# Patient Record
Sex: Female | Born: 1935 | Race: White | Hispanic: No | Smoking: Never smoker
Health system: Southern US, Community
[De-identification: ages and names within clinical notes are randomized; demographics above are authoritative.]

## PROBLEM LIST (undated history)

## (undated) DIAGNOSIS — H269 Unspecified cataract: Secondary | ICD-10-CM

## (undated) DIAGNOSIS — T8859XA Other complications of anesthesia, initial encounter: Secondary | ICD-10-CM

## (undated) DIAGNOSIS — M8580 Other specified disorders of bone density and structure, unspecified site: Secondary | ICD-10-CM

## (undated) DIAGNOSIS — C4490 Unspecified malignant neoplasm of skin, unspecified: Secondary | ICD-10-CM

## (undated) DIAGNOSIS — N814 Uterovaginal prolapse, unspecified: Secondary | ICD-10-CM

## (undated) DIAGNOSIS — M1990 Unspecified osteoarthritis, unspecified site: Secondary | ICD-10-CM

## (undated) DIAGNOSIS — K921 Melena: Secondary | ICD-10-CM

## (undated) DIAGNOSIS — K635 Polyp of colon: Secondary | ICD-10-CM

## (undated) DIAGNOSIS — C801 Malignant (primary) neoplasm, unspecified: Secondary | ICD-10-CM

## (undated) DIAGNOSIS — C541 Malignant neoplasm of endometrium: Secondary | ICD-10-CM

## (undated) DIAGNOSIS — K5790 Diverticulosis of intestine, part unspecified, without perforation or abscess without bleeding: Secondary | ICD-10-CM

## (undated) DIAGNOSIS — E785 Hyperlipidemia, unspecified: Secondary | ICD-10-CM

## (undated) DIAGNOSIS — K219 Gastro-esophageal reflux disease without esophagitis: Secondary | ICD-10-CM

## (undated) DIAGNOSIS — K8020 Calculus of gallbladder without cholecystitis without obstruction: Secondary | ICD-10-CM

## (undated) DIAGNOSIS — M810 Age-related osteoporosis without current pathological fracture: Secondary | ICD-10-CM

## (undated) DIAGNOSIS — I10 Essential (primary) hypertension: Secondary | ICD-10-CM

## (undated) DIAGNOSIS — K589 Irritable bowel syndrome without diarrhea: Secondary | ICD-10-CM

## (undated) DIAGNOSIS — Z923 Personal history of irradiation: Secondary | ICD-10-CM

## (undated) DIAGNOSIS — R519 Headache, unspecified: Secondary | ICD-10-CM

## (undated) HISTORY — DX: Uterovaginal prolapse, unspecified: N81.4

## (undated) HISTORY — DX: Malignant (primary) neoplasm, unspecified: C80.1

## (undated) HISTORY — PX: POLYPECTOMY: SHX149

## (undated) HISTORY — DX: Unspecified osteoarthritis, unspecified site: M19.90

## (undated) HISTORY — DX: Unspecified cataract: H26.9

## (undated) HISTORY — DX: Essential (primary) hypertension: I10

## (undated) HISTORY — DX: Gastro-esophageal reflux disease without esophagitis: K21.9

## (undated) HISTORY — DX: Personal history of irradiation: Z92.3

## (undated) HISTORY — DX: Unspecified malignant neoplasm of skin, unspecified: C44.90

## (undated) HISTORY — PX: COLONOSCOPY: SHX174

## (undated) HISTORY — DX: Diverticulosis of intestine, part unspecified, without perforation or abscess without bleeding: K57.90

## (undated) HISTORY — DX: Irritable bowel syndrome, unspecified: K58.9

## (undated) HISTORY — DX: Polyp of colon: K63.5

## (undated) HISTORY — DX: Age-related osteoporosis without current pathological fracture: M81.0

## (undated) HISTORY — DX: Other specified disorders of bone density and structure, unspecified site: M85.80

## (undated) HISTORY — DX: Melena: K92.1

## (undated) HISTORY — PX: OTHER SURGICAL HISTORY: SHX169

## (undated) HISTORY — DX: Hyperlipidemia, unspecified: E78.5

---

## 2002-04-11 ENCOUNTER — Encounter: Payer: Self-pay | Admitting: Gastroenterology

## 2002-12-26 ENCOUNTER — Encounter: Payer: Self-pay | Admitting: Gastroenterology

## 2004-03-06 ENCOUNTER — Ambulatory Visit (HOSPITAL_COMMUNITY): Admission: RE | Admit: 2004-03-06 | Discharge: 2004-03-06 | Payer: Self-pay | Admitting: Urology

## 2004-10-25 ENCOUNTER — Ambulatory Visit: Payer: Self-pay | Admitting: Gastroenterology

## 2004-10-28 ENCOUNTER — Ambulatory Visit (HOSPITAL_COMMUNITY): Admission: RE | Admit: 2004-10-28 | Discharge: 2004-10-28 | Payer: Self-pay | Admitting: Gastroenterology

## 2004-10-28 ENCOUNTER — Ambulatory Visit: Payer: Self-pay | Admitting: Internal Medicine

## 2004-10-30 ENCOUNTER — Ambulatory Visit: Payer: Self-pay | Admitting: Internal Medicine

## 2004-11-06 ENCOUNTER — Ambulatory Visit: Payer: Self-pay | Admitting: Internal Medicine

## 2004-12-24 ENCOUNTER — Ambulatory Visit: Payer: Self-pay | Admitting: Internal Medicine

## 2004-12-27 ENCOUNTER — Ambulatory Visit: Payer: Self-pay | Admitting: Internal Medicine

## 2005-01-10 ENCOUNTER — Ambulatory Visit: Payer: Self-pay | Admitting: Gastroenterology

## 2005-01-21 ENCOUNTER — Ambulatory Visit: Payer: Self-pay | Admitting: Cardiology

## 2005-03-31 ENCOUNTER — Ambulatory Visit: Payer: Self-pay | Admitting: Internal Medicine

## 2005-04-08 ENCOUNTER — Ambulatory Visit: Payer: Self-pay | Admitting: Family Medicine

## 2005-04-17 ENCOUNTER — Other Ambulatory Visit: Admission: RE | Admit: 2005-04-17 | Discharge: 2005-04-17 | Payer: Self-pay | Admitting: Obstetrics and Gynecology

## 2005-10-10 ENCOUNTER — Ambulatory Visit: Payer: Self-pay | Admitting: Gastroenterology

## 2005-11-14 ENCOUNTER — Ambulatory Visit: Payer: Self-pay | Admitting: Internal Medicine

## 2006-02-06 ENCOUNTER — Ambulatory Visit: Payer: Self-pay | Admitting: Internal Medicine

## 2006-02-13 ENCOUNTER — Ambulatory Visit: Payer: Self-pay | Admitting: Internal Medicine

## 2006-08-18 ENCOUNTER — Ambulatory Visit: Payer: Self-pay | Admitting: Internal Medicine

## 2006-08-20 ENCOUNTER — Ambulatory Visit: Payer: Self-pay | Admitting: Internal Medicine

## 2006-08-26 ENCOUNTER — Ambulatory Visit: Payer: Self-pay | Admitting: Internal Medicine

## 2006-10-06 ENCOUNTER — Ambulatory Visit: Payer: Self-pay | Admitting: Internal Medicine

## 2006-11-16 ENCOUNTER — Ambulatory Visit: Payer: Self-pay | Admitting: Internal Medicine

## 2006-11-16 LAB — CONVERTED CEMR LAB
ALT: 26 units/L (ref 0–40)
AST: 28 units/L (ref 0–37)
Alkaline Phosphatase: 64 units/L (ref 39–117)
BUN: 17 mg/dL (ref 6–23)
Chol/HDL Ratio, serum: 2.6
Cholesterol: 149 mg/dL (ref 0–200)
Creatinine, Ser: 0.8 mg/dL (ref 0.4–1.2)
Glucose, Bld: 96 mg/dL (ref 70–99)
HDL: 58.4 mg/dL (ref >39.0)
LDL Cholesterol: 81 mg/dL (ref 0–99)
Potassium: 3.8 meq/L (ref 3.5–5.1)
Sodium: 144 meq/L (ref 135–145)
Total Bilirubin: 0.5 mg/dL (ref 0.3–1.2)
Triglyceride fasting, serum: 49 mg/dL (ref 0–149)
VLDL: 10 mg/dL (ref 0–40)

## 2006-11-25 ENCOUNTER — Ambulatory Visit: Payer: Self-pay | Admitting: Internal Medicine

## 2007-02-19 ENCOUNTER — Ambulatory Visit: Payer: Self-pay | Admitting: Internal Medicine

## 2007-02-19 LAB — CONVERTED CEMR LAB
ALT: 30 units/L (ref 0–40)
AST: 25 units/L (ref 0–37)
Cholesterol: 192 mg/dL (ref 0–200)
Creatinine, Ser: 0.8 mg/dL (ref 0.4–1.2)
Glucose, Bld: 104 mg/dL — ABNORMAL HIGH (ref 70–99)
HCV Ab: NEGATIVE
HDL: 52.6 mg/dL (ref >39.0)
Hep B Core Total Ab: NEGATIVE
Hep B S Ab: NEGATIVE
LDL Cholesterol: 118 mg/dL — ABNORMAL HIGH (ref 0–99)
Potassium: 4.6 meq/L (ref 3.5–5.1)
Sodium: 142 meq/L (ref 135–145)
Total CHOL/HDL Ratio: 3.7
Triglycerides: 107 mg/dL (ref 0–149)
VLDL: 21 mg/dL (ref 0–40)

## 2007-02-24 ENCOUNTER — Ambulatory Visit: Payer: Self-pay | Admitting: Internal Medicine

## 2007-09-06 ENCOUNTER — Encounter: Payer: Self-pay | Admitting: Endocrinology

## 2007-09-06 ENCOUNTER — Encounter: Payer: Self-pay | Admitting: *Deleted

## 2007-09-06 DIAGNOSIS — K219 Gastro-esophageal reflux disease without esophagitis: Secondary | ICD-10-CM

## 2007-09-06 DIAGNOSIS — I10 Essential (primary) hypertension: Secondary | ICD-10-CM

## 2007-09-06 DIAGNOSIS — Z8679 Personal history of other diseases of the circulatory system: Secondary | ICD-10-CM | POA: Insufficient documentation

## 2007-09-29 ENCOUNTER — Ambulatory Visit: Payer: Self-pay | Admitting: Internal Medicine

## 2007-09-29 LAB — CONVERTED CEMR LAB
ALT: 42 units/L — ABNORMAL HIGH (ref 0–35)
AST: 43 units/L — ABNORMAL HIGH (ref 0–37)
Albumin: 3.7 g/dL (ref 3.5–5.2)
Alkaline Phosphatase: 99 units/L (ref 39–117)
Bilirubin, Direct: 0.1 mg/dL (ref 0.0–0.3)
Cholesterol: 182 mg/dL (ref 0–200)
Ferritin: 39.4 ng/mL (ref 10.0–291.0)
HDL: 47.2 mg/dL (ref >39.0)
Hgb A1c MFr Bld: 5.9 % (ref 4.6–6.0)
LDL Cholesterol: 119 mg/dL — ABNORMAL HIGH (ref 0–99)
TSH: 1.11 microintl units/mL (ref 0.35–5.50)
Total Bilirubin: 1 mg/dL (ref 0.3–1.2)
Total CHOL/HDL Ratio: 3.9
Total Protein: 6.8 g/dL (ref 6.0–8.3)
Triglycerides: 80 mg/dL (ref 0–149)
VLDL: 16 mg/dL (ref 0–40)

## 2007-10-06 ENCOUNTER — Telehealth: Payer: Self-pay | Admitting: Internal Medicine

## 2007-10-09 ENCOUNTER — Ambulatory Visit: Payer: Self-pay | Admitting: Internal Medicine

## 2007-10-12 ENCOUNTER — Encounter: Payer: Self-pay | Admitting: Internal Medicine

## 2007-11-10 ENCOUNTER — Encounter: Payer: Self-pay | Admitting: Internal Medicine

## 2007-11-15 ENCOUNTER — Ambulatory Visit: Payer: Self-pay | Admitting: Internal Medicine

## 2007-11-15 DIAGNOSIS — G43909 Migraine, unspecified, not intractable, without status migrainosus: Secondary | ICD-10-CM

## 2007-11-21 DIAGNOSIS — E785 Hyperlipidemia, unspecified: Secondary | ICD-10-CM | POA: Insufficient documentation

## 2007-11-21 DIAGNOSIS — M1990 Unspecified osteoarthritis, unspecified site: Secondary | ICD-10-CM | POA: Insufficient documentation

## 2007-11-21 DIAGNOSIS — M899 Disorder of bone, unspecified: Secondary | ICD-10-CM | POA: Insufficient documentation

## 2007-11-21 DIAGNOSIS — M949 Disorder of cartilage, unspecified: Secondary | ICD-10-CM

## 2007-12-06 ENCOUNTER — Ambulatory Visit: Payer: Self-pay | Admitting: Gastroenterology

## 2008-02-07 ENCOUNTER — Telehealth: Payer: Self-pay | Admitting: Internal Medicine

## 2008-02-17 ENCOUNTER — Ambulatory Visit: Payer: Self-pay | Admitting: Internal Medicine

## 2008-02-18 LAB — CONVERTED CEMR LAB
ALT: 25 units/L (ref 0–35)
AST: 23 units/L (ref 0–37)
Albumin: 3.7 g/dL (ref 3.5–5.2)
Alkaline Phosphatase: 82 units/L (ref 39–117)
BUN: 15 mg/dL (ref 6–23)
Bilirubin, Direct: 0.1 mg/dL (ref 0.0–0.3)
CO2: 33 meq/L — ABNORMAL HIGH (ref 19–32)
Calcium: 9.1 mg/dL (ref 8.4–10.5)
Chloride: 105 meq/L (ref 96–112)
Cholesterol: 180 mg/dL (ref 0–200)
Creatinine, Ser: 0.8 mg/dL (ref 0.4–1.2)
GFR calc Af Amer: 91 mL/min
GFR calc non Af Amer: 75 mL/min
Glucose, Bld: 106 mg/dL — ABNORMAL HIGH (ref 70–99)
HDL: 50.1 mg/dL (ref >39.0)
LDL Cholesterol: 107 mg/dL — ABNORMAL HIGH (ref 0–99)
Potassium: 4.5 meq/L (ref 3.5–5.1)
Sodium: 141 meq/L (ref 135–145)
Total Bilirubin: 0.6 mg/dL (ref 0.3–1.2)
Total CHOL/HDL Ratio: 3.6
Total Protein: 6.8 g/dL (ref 6.0–8.3)
Triglycerides: 116 mg/dL (ref 0–149)
VLDL: 23 mg/dL (ref 0–40)

## 2008-02-22 ENCOUNTER — Ambulatory Visit: Payer: Self-pay | Admitting: Internal Medicine

## 2008-02-22 DIAGNOSIS — R0989 Other specified symptoms and signs involving the circulatory and respiratory systems: Secondary | ICD-10-CM | POA: Insufficient documentation

## 2008-02-29 ENCOUNTER — Ambulatory Visit: Payer: Self-pay

## 2008-02-29 ENCOUNTER — Encounter: Payer: Self-pay | Admitting: Internal Medicine

## 2008-03-31 ENCOUNTER — Encounter: Payer: Self-pay | Admitting: Internal Medicine

## 2008-04-28 ENCOUNTER — Encounter: Payer: Self-pay | Admitting: Internal Medicine

## 2008-05-02 ENCOUNTER — Telehealth: Payer: Self-pay | Admitting: Internal Medicine

## 2008-08-01 ENCOUNTER — Ambulatory Visit: Payer: Self-pay | Admitting: Internal Medicine

## 2008-08-02 LAB — CONVERTED CEMR LAB
BUN: 13 mg/dL (ref 6–23)
CO2: 30 meq/L (ref 19–32)
Calcium: 8.8 mg/dL (ref 8.4–10.5)
Chloride: 108 meq/L (ref 96–112)
Creatinine, Ser: 0.9 mg/dL (ref 0.4–1.2)
GFR calc Af Amer: 79 mL/min
GFR calc non Af Amer: 65 mL/min
Glucose, Bld: 104 mg/dL — ABNORMAL HIGH (ref 70–99)
Potassium: 4.4 meq/L (ref 3.5–5.1)
Sodium: 141 meq/L (ref 135–145)

## 2008-08-07 ENCOUNTER — Ambulatory Visit: Payer: Self-pay | Admitting: Internal Medicine

## 2008-08-07 DIAGNOSIS — R109 Unspecified abdominal pain: Secondary | ICD-10-CM | POA: Insufficient documentation

## 2008-08-07 DIAGNOSIS — R7309 Other abnormal glucose: Secondary | ICD-10-CM | POA: Insufficient documentation

## 2008-08-07 DIAGNOSIS — R935 Abnormal findings on diagnostic imaging of other abdominal regions, including retroperitoneum: Secondary | ICD-10-CM

## 2008-08-08 ENCOUNTER — Ambulatory Visit: Payer: Self-pay | Admitting: Cardiology

## 2008-09-12 ENCOUNTER — Ambulatory Visit: Payer: Self-pay | Admitting: Internal Medicine

## 2008-10-06 ENCOUNTER — Encounter: Payer: Self-pay | Admitting: Internal Medicine

## 2008-11-27 ENCOUNTER — Telehealth: Payer: Self-pay | Admitting: Internal Medicine

## 2009-01-05 ENCOUNTER — Ambulatory Visit: Payer: Self-pay | Admitting: Internal Medicine

## 2009-01-05 LAB — CONVERTED CEMR LAB
ALT: 29 units/L (ref 0–35)
AST: 27 units/L (ref 0–37)
Albumin: 3.8 g/dL (ref 3.5–5.2)
Alkaline Phosphatase: 84 units/L (ref 39–117)
BUN: 17 mg/dL (ref 6–23)
Bilirubin, Direct: 0.1 mg/dL (ref 0.0–0.3)
CO2: 30 meq/L (ref 19–32)
Calcium: 8.9 mg/dL (ref 8.4–10.5)
Chloride: 104 meq/L (ref 96–112)
Cholesterol: 177 mg/dL (ref 0–200)
Creatinine, Ser: 0.7 mg/dL (ref 0.4–1.2)
GFR calc Af Amer: 106 mL/min
GFR calc non Af Amer: 87 mL/min
Glucose, Bld: 105 mg/dL — ABNORMAL HIGH (ref 70–99)
HDL: 52.1 mg/dL (ref >39.0)
LDL Cholesterol: 105 mg/dL — ABNORMAL HIGH (ref 0–99)
Potassium: 3.7 meq/L (ref 3.5–5.1)
Sodium: 140 meq/L (ref 135–145)
Total Bilirubin: 0.8 mg/dL (ref 0.3–1.2)
Total CHOL/HDL Ratio: 3.4
Total Protein: 6.9 g/dL (ref 6.0–8.3)
Triglycerides: 102 mg/dL (ref 0–149)
VLDL: 20 mg/dL (ref 0–40)

## 2009-01-23 ENCOUNTER — Ambulatory Visit: Payer: Self-pay | Admitting: Internal Medicine

## 2009-03-27 ENCOUNTER — Ambulatory Visit: Payer: Self-pay | Admitting: Internal Medicine

## 2009-07-19 ENCOUNTER — Ambulatory Visit: Payer: Self-pay | Admitting: Internal Medicine

## 2009-07-19 LAB — CONVERTED CEMR LAB
BUN: 16 mg/dL (ref 6–23)
Basophils Absolute: 0 10*3/uL (ref 0.0–0.1)
Basophils Relative: 0.1 % (ref 0.0–3.0)
Bilirubin Urine: NEGATIVE
CO2: 28 meq/L (ref 19–32)
Calcium: 9.3 mg/dL (ref 8.4–10.5)
Chloride: 102 meq/L (ref 96–112)
Cholesterol: 197 mg/dL (ref 0–200)
Creatinine, Ser: 0.8 mg/dL (ref 0.4–1.2)
Eosinophils Absolute: 0.1 10*3/uL (ref 0.0–0.7)
Eosinophils Relative: 2.5 % (ref 0.0–5.0)
GFR calc non Af Amer: 74.66 mL/min (ref >60)
Glucose, Bld: 99 mg/dL (ref 70–99)
HCT: 34.5 % — ABNORMAL LOW (ref 36.0–46.0)
HDL: 48.5 mg/dL (ref >39.00)
Hemoglobin, Urine: NEGATIVE
Hemoglobin: 11.9 g/dL — ABNORMAL LOW (ref 12.0–15.0)
Ketones, ur: NEGATIVE mg/dL
LDL Cholesterol: 131 mg/dL — ABNORMAL HIGH (ref 0–99)
Leukocytes, UA: NEGATIVE
Lymphocytes Relative: 47.1 % — ABNORMAL HIGH (ref 12.0–46.0)
Lymphs Abs: 2 10*3/uL (ref 0.7–4.0)
MCHC: 34.5 g/dL (ref 30.0–36.0)
MCV: 92.8 fL (ref 78.0–100.0)
Monocytes Absolute: 0.3 10*3/uL (ref 0.1–1.0)
Monocytes Relative: 7.1 % (ref 3.0–12.0)
Neutro Abs: 1.9 10*3/uL (ref 1.4–7.7)
Neutrophils Relative %: 43.2 % (ref 43.0–77.0)
Nitrite: NEGATIVE
Platelets: 200 10*3/uL (ref 150.0–400.0)
Potassium: 4.1 meq/L (ref 3.5–5.1)
RBC: 3.71 M/uL — ABNORMAL LOW (ref 3.87–5.11)
RDW: 12.9 % (ref 11.5–14.6)
Sodium: 145 meq/L (ref 135–145)
Specific Gravity, Urine: 1.015 (ref 1.000–1.030)
TSH: 1.33 microintl units/mL (ref 0.35–5.50)
Total CHOL/HDL Ratio: 4
Total Protein, Urine: NEGATIVE mg/dL
Triglycerides: 86 mg/dL (ref 0.0–149.0)
Urine Glucose: NEGATIVE mg/dL
Urobilinogen, UA: 0.2 (ref 0.0–1.0)
VLDL: 17.2 mg/dL (ref 0.0–40.0)
Vit D, 25-Hydroxy: 40 ng/mL
WBC: 4.3 10*3/uL — ABNORMAL LOW (ref 4.5–10.5)
pH: 7.5 (ref 5.0–8.0)

## 2009-07-31 ENCOUNTER — Ambulatory Visit: Payer: Self-pay | Admitting: Internal Medicine

## 2009-07-31 DIAGNOSIS — R7989 Other specified abnormal findings of blood chemistry: Secondary | ICD-10-CM | POA: Insufficient documentation

## 2009-09-25 ENCOUNTER — Ambulatory Visit: Payer: Self-pay | Admitting: Internal Medicine

## 2009-10-08 ENCOUNTER — Encounter: Payer: Self-pay | Admitting: Internal Medicine

## 2009-12-18 ENCOUNTER — Ambulatory Visit: Payer: Self-pay | Admitting: Internal Medicine

## 2009-12-18 ENCOUNTER — Encounter: Payer: Self-pay | Admitting: Internal Medicine

## 2010-01-18 ENCOUNTER — Ambulatory Visit: Payer: Self-pay | Admitting: Internal Medicine

## 2010-01-18 ENCOUNTER — Telehealth: Payer: Self-pay | Admitting: Internal Medicine

## 2010-01-18 LAB — CONVERTED CEMR LAB
ALT: 27 units/L (ref 0–35)
AST: 22 units/L (ref 0–37)
Albumin: 3.8 g/dL (ref 3.5–5.2)
Alkaline Phosphatase: 100 units/L (ref 39–117)
BUN: 12 mg/dL (ref 6–23)
Bilirubin, Direct: 0.1 mg/dL (ref 0.0–0.3)
CO2: 31 meq/L (ref 19–32)
Calcium: 9.1 mg/dL (ref 8.4–10.5)
Chloride: 103 meq/L (ref 96–112)
Cholesterol: 180 mg/dL (ref 0–200)
Creatinine, Ser: 0.8 mg/dL (ref 0.4–1.2)
GFR calc non Af Amer: 74.56 mL/min (ref >60)
Glucose, Bld: 106 mg/dL — ABNORMAL HIGH (ref 70–99)
HDL: 56.8 mg/dL (ref >39.00)
LDL Cholesterol: 100 mg/dL — ABNORMAL HIGH (ref 0–99)
Potassium: 4.3 meq/L (ref 3.5–5.1)
Sodium: 139 meq/L (ref 135–145)
Total Bilirubin: 0.7 mg/dL (ref 0.3–1.2)
Total CHOL/HDL Ratio: 3
Total Protein: 6.9 g/dL (ref 6.0–8.3)
Triglycerides: 116 mg/dL (ref 0.0–149.0)
VLDL: 23.2 mg/dL (ref 0.0–40.0)

## 2010-01-29 ENCOUNTER — Ambulatory Visit: Payer: Self-pay | Admitting: Internal Medicine

## 2010-04-09 ENCOUNTER — Ambulatory Visit: Payer: Self-pay | Admitting: Gastroenterology

## 2010-05-02 ENCOUNTER — Encounter (INDEPENDENT_AMBULATORY_CARE_PROVIDER_SITE_OTHER): Payer: Self-pay | Admitting: *Deleted

## 2010-05-02 ENCOUNTER — Ambulatory Visit: Payer: Self-pay | Admitting: Gastroenterology

## 2010-05-02 DIAGNOSIS — K5900 Constipation, unspecified: Secondary | ICD-10-CM

## 2010-05-16 ENCOUNTER — Telehealth: Payer: Self-pay | Admitting: Gastroenterology

## 2010-05-22 ENCOUNTER — Ambulatory Visit: Payer: Self-pay | Admitting: Gastroenterology

## 2010-05-23 ENCOUNTER — Ambulatory Visit: Payer: Self-pay | Admitting: Internal Medicine

## 2010-05-23 LAB — CONVERTED CEMR LAB
ALT: 25 units/L (ref 0–35)
AST: 22 units/L (ref 0–37)
Albumin: 3.7 g/dL (ref 3.5–5.2)
Alkaline Phosphatase: 90 units/L (ref 39–117)
BUN: 9 mg/dL (ref 6–23)
Basophils Absolute: 0 10*3/uL (ref 0.0–0.1)
Basophils Relative: 0.5 % (ref 0.0–3.0)
Bilirubin, Direct: 0.1 mg/dL (ref 0.0–0.3)
CO2: 31 meq/L (ref 19–32)
Calcium: 9 mg/dL (ref 8.4–10.5)
Chloride: 103 meq/L (ref 96–112)
Cholesterol: 181 mg/dL (ref 0–200)
Creatinine, Ser: 0.8 mg/dL (ref 0.4–1.2)
Eosinophils Absolute: 0.1 10*3/uL (ref 0.0–0.7)
Eosinophils Relative: 1.9 % (ref 0.0–5.0)
GFR calc non Af Amer: 73.42 mL/min (ref >60)
Glucose, Bld: 99 mg/dL (ref 70–99)
HCT: 32.6 % — ABNORMAL LOW (ref 36.0–46.0)
HDL: 48.9 mg/dL (ref >39.00)
Hemoglobin: 11.3 g/dL — ABNORMAL LOW (ref 12.0–15.0)
LDL Cholesterol: 115 mg/dL — ABNORMAL HIGH (ref 0–99)
Lymphocytes Relative: 45.8 % (ref 12.0–46.0)
Lymphs Abs: 1.7 10*3/uL (ref 0.7–4.0)
MCHC: 34.7 g/dL (ref 30.0–36.0)
MCV: 95.1 fL (ref 78.0–100.0)
Monocytes Absolute: 0.3 10*3/uL (ref 0.1–1.0)
Monocytes Relative: 7.9 % (ref 3.0–12.0)
Neutro Abs: 1.6 10*3/uL (ref 1.4–7.7)
Neutrophils Relative %: 43.9 % (ref 43.0–77.0)
Platelets: 231 10*3/uL (ref 150.0–400.0)
Potassium: 4.2 meq/L (ref 3.5–5.1)
RBC: 3.43 M/uL — ABNORMAL LOW (ref 3.87–5.11)
RDW: 13.4 % (ref 11.5–14.6)
Sodium: 140 meq/L (ref 135–145)
Total Bilirubin: 0.7 mg/dL (ref 0.3–1.2)
Total CHOL/HDL Ratio: 4
Total Protein: 5.9 g/dL — ABNORMAL LOW (ref 6.0–8.3)
Triglycerides: 86 mg/dL (ref 0.0–149.0)
VLDL: 17.2 mg/dL (ref 0.0–40.0)
Vitamin B-12: 569 pg/mL (ref 211–911)
WBC: 3.6 10*3/uL — ABNORMAL LOW (ref 4.5–10.5)

## 2010-05-31 ENCOUNTER — Ambulatory Visit: Payer: Self-pay | Admitting: Internal Medicine

## 2010-05-31 DIAGNOSIS — F411 Generalized anxiety disorder: Secondary | ICD-10-CM

## 2010-07-15 ENCOUNTER — Encounter: Payer: Self-pay | Admitting: Internal Medicine

## 2010-09-19 ENCOUNTER — Ambulatory Visit: Payer: Self-pay | Admitting: Internal Medicine

## 2010-09-19 LAB — CONVERTED CEMR LAB
ALT: 32 units/L (ref 0–35)
AST: 24 units/L (ref 0–37)
Albumin: 3.7 g/dL (ref 3.5–5.2)
Alkaline Phosphatase: 99 units/L (ref 39–117)
BUN: 17 mg/dL (ref 6–23)
Basophils Absolute: 0 10*3/uL (ref 0.0–0.1)
Basophils Relative: 0.4 % (ref 0.0–3.0)
Bilirubin Urine: NEGATIVE
Bilirubin, Direct: 0.1 mg/dL (ref 0.0–0.3)
CO2: 29 meq/L (ref 19–32)
Calcium: 9 mg/dL (ref 8.4–10.5)
Chloride: 97 meq/L (ref 96–112)
Cholesterol: 187 mg/dL (ref 0–200)
Creatinine, Ser: 0.9 mg/dL (ref 0.4–1.2)
Eosinophils Absolute: 0.1 10*3/uL (ref 0.0–0.7)
Eosinophils Relative: 1.3 % (ref 0.0–5.0)
GFR calc non Af Amer: 66.67 mL/min (ref >60)
Glucose, Bld: 99 mg/dL (ref 70–99)
HCT: 33.8 % — ABNORMAL LOW (ref 36.0–46.0)
HDL: 49.7 mg/dL (ref >39.00)
Hemoglobin, Urine: NEGATIVE
Hemoglobin: 11.7 g/dL — ABNORMAL LOW (ref 12.0–15.0)
Ketones, ur: NEGATIVE mg/dL
LDL Cholesterol: 115 mg/dL — ABNORMAL HIGH (ref 0–99)
Leukocytes, UA: NEGATIVE
Lymphocytes Relative: 41.9 % (ref 12.0–46.0)
Lymphs Abs: 2 10*3/uL (ref 0.7–4.0)
MCHC: 34.5 g/dL (ref 30.0–36.0)
MCV: 95.6 fL (ref 78.0–100.0)
Monocytes Absolute: 0.5 10*3/uL (ref 0.1–1.0)
Monocytes Relative: 10.1 % (ref 3.0–12.0)
Neutro Abs: 2.2 10*3/uL (ref 1.4–7.7)
Neutrophils Relative %: 46.3 % (ref 43.0–77.0)
Nitrite: NEGATIVE
Platelets: 247 10*3/uL (ref 150.0–400.0)
Potassium: 4.4 meq/L (ref 3.5–5.1)
RBC: 3.54 M/uL — ABNORMAL LOW (ref 3.87–5.11)
RDW: 13.2 % (ref 11.5–14.6)
Sodium: 133 meq/L — ABNORMAL LOW (ref 135–145)
Specific Gravity, Urine: 1.02 (ref 1.000–1.030)
TSH: 1.3 microintl units/mL (ref 0.35–5.50)
Total Bilirubin: 0.6 mg/dL (ref 0.3–1.2)
Total CHOL/HDL Ratio: 4
Total Protein, Urine: NEGATIVE mg/dL
Total Protein: 6.2 g/dL (ref 6.0–8.3)
Triglycerides: 113 mg/dL (ref 0.0–149.0)
Urine Glucose: NEGATIVE mg/dL
Urobilinogen, UA: 0.2 (ref 0.0–1.0)
VLDL: 22.6 mg/dL (ref 0.0–40.0)
WBC: 4.8 10*3/uL (ref 4.5–10.5)
pH: 7 (ref 5.0–8.0)

## 2010-09-27 ENCOUNTER — Encounter: Payer: Self-pay | Admitting: Internal Medicine

## 2010-09-27 ENCOUNTER — Ambulatory Visit: Payer: Self-pay | Admitting: Internal Medicine

## 2010-10-10 LAB — HM MAMMOGRAPHY: HM Mammogram: NORMAL

## 2010-10-15 ENCOUNTER — Encounter: Payer: Self-pay | Admitting: Internal Medicine

## 2010-11-11 ENCOUNTER — Telehealth: Payer: Self-pay | Admitting: Internal Medicine

## 2010-11-15 ENCOUNTER — Telehealth: Payer: Self-pay | Admitting: Internal Medicine

## 2010-11-20 ENCOUNTER — Telehealth: Payer: Self-pay | Admitting: Internal Medicine

## 2011-01-05 ENCOUNTER — Encounter: Payer: Self-pay | Admitting: Urology

## 2011-01-14 NOTE — Assessment & Plan Note (Signed)
Summary: FU---STC   Vital Signs:  Patient profile:   77 year old female Weight:      158 pounds BMI:     24.11 Temp:     97.7 degrees F oral Pulse rate:   61 / minute BP sitting:   122 / 76  (left arm)  Vitals Entered By: Tora Perches (January 29, 2010 10:03 AM) CC: f/u Is Patient Diabetic? No   Primary Care Provider:  Tresa Garter MD  CC:  f/u.  History of Present Illness: The patient presents for a follow up of hypertension, anxiety, hyperlipidemia.   Preventive Screening-Counseling & Management  Alcohol-Tobacco     Smoking Status: never  Current Medications (verified): 1)  Captopril 50 Mg Tabs (Captopril) .Marland Kitchen.. 1 By Mouth Bid 2)  Lipitor 20 Mg  Tabs (Atorvastatin Calcium) .... Take 1 By Mouth Qd 3)  Butalbital-Apap-Caffeine 50-325-40 Mg  Tabs (Butalbital-Apap-Caffeine) .... Take 1 By Mouth Qd 4)  Lorazepam 0.5 Mg  Tabs (Lorazepam) .... Take 1 By Mouth Qd 5)  Omeprazole 20 Mg Cpdr (Omeprazole) .... 2 Once Daily 6)  Vitamin D3 1000 Unit  Tabs (Cholecalciferol) .Marland Kitchen.. 1 Qd 7)  Inderal La 120 Mg Xr24h-Cap (Propranolol Hcl) .Marland Kitchen.. 1 By Mouth Q Am 8)  Hydrochlorothiazide 12.5 Mg Caps (Hydrochlorothiazide) .... Once Daily  Allergies: 1)  Zocor 2)  Fosamax (Alendronate Sodium)  Past History:  Past Medical History: Last updated: 11/15/2007 GERD Hypertension Headache Hyperlipidemia Osteoarthritis Osteopenia  Social History: Last updated: 11/15/2007 Single Never Smoked  Family History: Family History of CAD Female 1st degree relative <50 Family History of Colon CA 1st degree relative <60 M CVA  Review of Systems  The patient denies fever, dyspnea on exertion, abdominal pain, and melena.    Physical Exam  General:  Well-developed,well-nourished,in no acute distress; alert,appropriate and cooperative throughout examination Nose:  External nasal examination shows no deformity or inflammation. Nasal mucosa are pink and moist without lesions or  exudates. Mouth:  Oral mucosa and oropharynx without lesions or exudates.  Teeth in good repair. Neck:  No deformities, masses, or tenderness noted. Lungs:  Normal respiratory effort, chest expands symmetrically. Lungs are clear to auscultation, no crackles or wheezes. Heart:  Normal rate and regular rhythm. S1 and S2 normal without gallop, murmur, click, rub or other extra sounds. Abdomen:  Bowel sounds positive,abdomen soft and non-tender without masses, organomegaly or hernias noted. Msk:  No deformity or scoliosis noted of thoracic or lumbar spine.   Extremities:  No clubbing, cyanosis, edema, or deformity noted with normal full range of motion of all joints.   Neurologic:  No cranial nerve deficits noted. Station and gait are normal. Plantar reflexes are down-going bilaterally. DTRs are symmetrical throughout. Sensory, motor and coordinative functions appear intact. Skin:  Intact without suspicious lesions or rashes Psych:  Cognition and judgment appear intact. Alert and cooperative with normal attention span and concentration. No apparent delusions, illusions, hallucinations   Impression & Recommendations:  Problem # 1:  ABNORMAL GLUCOSE NEC (ICD-790.29) Assessment Comment Only The labs were reviewed with the patient.   Wt loss  Problem # 2:  HYPERLIPIDEMIA (ICD-272.4) Assessment: Improved  Her updated medication list for this problem includes:    Lipitor 20 Mg Tabs (Atorvastatin calcium) .Marland Kitchen... Take 1 by mouth qd  Problem # 3:  CAROTID BRUIT, RIGHT (ICD-785.9), fam h/o CVA Assessment: Comment Only Aggrenox [place] OK  Problem # 4:  HYPERTENSION (ICD-401.9) Assessment: Unchanged  The following medications were removed from the medication list:  Captopril 50 Mg Tabs (Captopril) .Marland Kitchen..Marland Kitchen Two times a day Her updated medication list for this problem includes:    Captopril 50 Mg Tabs (Captopril) .Marland Kitchen... 1 by mouth bid    Inderal La 120 Mg Xr24h-cap (Propranolol hcl) .Marland Kitchen... 1 by mouth q  am    Hydrochlorothiazide 12.5 Mg Caps (Hydrochlorothiazide) ..... Once daily  Complete Medication List: 1)  Captopril 50 Mg Tabs (Captopril) .Marland Kitchen.. 1 by mouth bid 2)  Lipitor 20 Mg Tabs (Atorvastatin calcium) .... Take 1 by mouth qd 3)  Butalbital-apap-caffeine 50-325-40 Mg Tabs (Butalbital-apap-caffeine) .... Take 1 by mouth qd 4)  Lorazepam 0.5 Mg Tabs (Lorazepam) .... Take 1 by mouth qd 5)  Omeprazole 20 Mg Cpdr (Omeprazole) .... 2 once daily 6)  Vitamin D3 1000 Unit Tabs (Cholecalciferol) .Marland Kitchen.. 1 qd 7)  Inderal La 120 Mg Xr24h-cap (Propranolol hcl) .Marland Kitchen.. 1 by mouth q am 8)  Hydrochlorothiazide 12.5 Mg Caps (Hydrochlorothiazide) .... Once daily 9)  Aspirin 81 Mg Tbec (Aspirin) .Marland Kitchen.. 1 by mouth qd 10)  Aggrenox 25-200 Mg Xr12h-cap (Aspirin-dipyridamole) .Marland Kitchen.. 1 by mouth bid  Contraindications/Deferment of Procedures/Staging:    Test/Procedure: Pneumovax vaccine    Reason for deferment: patient declined   Patient Instructions: 1)  Please schedule a follow-up appointment in 4 months. 2)  BMP prior to visit, ICD-9: 3)  Hepatic Panel prior to visit, ICD-9: 4)  Lipid Panel prior to visit, ICD-9: 5)  CBC w/ Diff prior to visit, ICD-9:782.0 995.20 272.0 6)  Vit B12 7)  Use stretching exercises that I have provided (15 min. or longer every day) Prescriptions: LORAZEPAM 0.5 MG  TABS (LORAZEPAM) TAKE 1 by mouth QD  #30 x 6   Entered and Authorized by:   Tresa Garter MD   Signed by:   Tresa Garter MD on 01/29/2010   Method used:   Print then Give to Patient   RxID:   [id number] HYDROCHLOROTHIAZIDE 12.5 MG CAPS (HYDROCHLOROTHIAZIDE) once daily  #30 x 12   Entered and Authorized by:   Tresa Garter MD   Signed by:   Tresa Garter MD on 01/29/2010   Method used:   Electronically to        [facility] # [phone]* (retail)       [address] [place], [place]  [address]       Ph: [phone]       Fax: [phone]   RxID:   [id number] INDERAL  LA 120 MG XR24H-CAP (PROPRANOLOL HCL) 1 by mouth q am  #30 x 12   Entered and Authorized by:   Tresa Garter MD   Signed by:   Tresa Garter MD on 01/29/2010   Method used:   [facility] to        [facility] # [phone]* (retail)       [address] [place], [place]  [address]       Ph: [phone]       Fax: [phone]   RxID:   [id number] BUTALBITAL-APAP-CAFFEINE 50-325-40 MG  TABS (BUTALBITAL-APAP-CAFFEINE) TAKE 1 by mouth QD  #60 x 6   Entered and Authorized by:   Tresa Garter MD   Signed by:   Tresa Garter MD on 01/29/2010   Method used:   Electronically to        [facility] # [phone]* (retail)       [phone]  [address] [place], [place]  [address]       Ph: [phone]       Fax: [phone]   RxID:   [id number] LIPITOR 20 MG  TABS (ATORVASTATIN CALCIUM) TAKE 1 by mouth QD  #30 x 12   Entered and Authorized by:   Tresa Garter MD   Signed by:   Tresa Garter MD on 01/29/2010   Method used:   Electronically to        [facility] # [phone]* (retail)       [address] [place], [place]  [address]       Ph: [phone]       Fax: [phone]   RxID:   [id number] CAPTOPRIL 50 MG TABS (CAPTOPRIL) 1 by mouth bid  #60 x 12   Entered and Authorized by:   Tresa Garter MD   Signed by:   Tresa Garter MD on 01/29/2010   Method used:   Electronically to        [facility] # [phone]* (retail)       [address] [place], [place]  [address]       Ph: [phone]       Fax: [phone]   RxID:   [id number] AGGRENOX 25-200 MG XR12H-CAP (ASPIRIN-DIPYRIDAMOLE) 1 by mouth bid  #60 x 12   Entered and Authorized by:   Tresa Garter MD   Signed by:   Tresa Garter MD on 01/29/2010   Method used:   Electronically to        [facility] # [phone]* (retail)       [address] [place], [place]  [address]       Ph: [phone]       Fax: [phone]    RxID:   [id number]

## 2011-01-14 NOTE — Assessment & Plan Note (Signed)
Summary: FLU SHOT/ AVP /NWS     Flu Vaccine Consent Questions     Do you have a history of severe allergic reactions to this vaccine? no    Any prior history of allergic reactions to egg and/or gelatin? no    Do you have a sensitivity to the preservative Thimersol? no    Do you have a past history of Guillan-Barre Syndrome? no    Do you currently have an acute febrile illness? no    Have you ever had a severe reaction to latex? no    Vaccine information given and explained to patient? yes    Are you currently pregnant? no    Lot Number:[id number]   Exp Date:06/14/2011   Site Given  Right Deltoid IM  Nurse Visit   Allergies: 1)  Zocor 2)  Fosamax (Alendronate Sodium)  Orders Added: 1)  Flu Vaccine 83yrs + MEDICARE PATIENTS [Q2039] 2)  Administration Flu vaccine - MCR [G0008]

## 2011-01-14 NOTE — Progress Notes (Signed)
Summary: resend rx   Phone Note Call from Patient Call back at Home Phone [phone]   Caller: Patient Call For: Jarold Motto Reason for Call: Talk to Nurse Summary of Call: [facility] [address], the pharmacy that it was sent to is the wrong one. Initial call taken by: Tawni Levy,  May 16, 2010 8:49 AM  Follow-up for Phone Call        LM for pt that Rx has been sent as requested. Follow-up by: Ashok Cordia RN,  May 16, 2010 8:59 AM    Prescriptions: MOVIPREP 100 GM  SOLR (PEG-KCL-NACL-NASULF-NA ASC-C) As per prep instructions.  #1 x 0   Entered by:   Ashok Cordia RN   Authorized by:   Mardella Layman MD [facility]   Signed by:   Ashok Cordia RN on 05/16/2010   Method used:   Electron[facility] to        [facility] # [phone]* (retail)       [address] [place], [place]  [address]       Ph: [phone]       Fax: [phone]   RxID:   [id number]

## 2011-01-14 NOTE — Assessment & Plan Note (Signed)
Summary: REC COL, AGGRENOX, SPEAKS RUSSIAN...AS.   History of Present Illness Visit Type: new paitient Primary GI MD: Sheryn Bison MD FACP FAGA Primary Provider: Sonda Primes, MD Chief Complaint: pt is requesting colonoscopy she is on Aggrenox, pt's mother had colon cancer History of Present Illness:   Extremely Pleasant 77 year old Russian-American Caucasian female who has chronic functional constipation without rectal bleeding or abdominal pain. Colonoscopy 5 years ago was unremarkable. She has chronic acid reflux and takes daily omeprazole. She previously had endoscopy and esophageal dilatation. She denies any significant reflux symptoms or dysphagia at this time. She is on Aggrenox 25-200 mg twice a day because of a family history of peripheral vascular disease and CVAs. The patient herself has not had any evidence previously of strokes or vascular compromise. She does have hyperlipidemia and mild hypertension and has chronic headaches. She denies abuse of alcohol, cigarettes, or other NSAIDs. Family history is positive for colon cancer in her mother at age 92.  She denies any specific food intolerances and is eating a regular diet with no weight loss. She's not had previous abdominal surgical procedures. She denies any current cardiovascular, pulmonary, or genitourinary gynecologic problems.   GI Review of Systems    Reports acid reflux and  heartburn.      Denies abdominal pain, belching, bloating, chest pain, dysphagia with liquids, dysphagia with solids, loss of appetite, nausea, vomiting, vomiting blood, weight loss, and  weight gain.      Reports constipation.     Denies anal fissure, black tarry stools, change in bowel habit, diarrhea, diverticulosis, fecal incontinence, heme positive stool, hemorrhoids, irritable bowel syndrome, jaundice, light color stool, liver problems, rectal bleeding, and  rectal pain. Preventive Screening-Counseling & Management      Drug Use:  no.        Current Medications (verified): 1)  Captopril 50 Mg Tabs (Captopril) .Marland Kitchen.. 1 By Mouth Bid 2)  Lipitor 20 Mg  Tabs (Atorvastatin Calcium) .... Take 1 By Mouth Qd 3)  Butalbital-Apap-Caffeine 50-325-40 Mg  Tabs (Butalbital-Apap-Caffeine) .... Take 1 By Mouth Qd 4)  Lorazepam 0.5 Mg  Tabs (Lorazepam) .... Take 1 By Mouth Qd 5)  Omeprazole 20 Mg Cpdr (Omeprazole) .... 2 Once Daily 6)  Vitamin D3 1000 Unit  Tabs (Cholecalciferol) .Marland Kitchen.. 1 Qd 7)  Inderal La 120 Mg Xr24h-Cap (Propranolol Hcl) .Marland Kitchen.. 1 By Mouth Q Am 8)  Hydrochlorothiazide 12.5 Mg Caps (Hydrochlorothiazide) .... Once Daily 9)  Aggrenox 25-200 Mg Xr12h-Cap (Aspirin-Dipyridamole) .Marland Kitchen.. 1 By Mouth Bid  Allergies (verified): 1)  Zocor 2)  Fosamax (Alendronate Sodium)  Past History:  Past medical, surgical, family and social histories (including risk factors) reviewed for relevance to current acute and chronic problems.  Past Medical History: Reviewed history from 11/15/2007 and no changes required. GERD Hypertension Headache Hyperlipidemia Osteoarthritis Osteopenia  Past Surgical History: Reviewed history from 09/06/2007 and no changes required. egd (12/26/2002)  Family History: Reviewed history from 01/29/2010 and no changes required. Family History of CAD Female 1st degree relative <50 Family History of Colon CA 1st degree relative <60--Mother M CVA  Social History: Reviewed history from 11/15/2007 and no changes required. Widowed, 1 boy Never Smoked Alcohol Use - no Daily Caffeine Use--tea Illicit Drug Use - no Drug Use:  no  Review of Systems       The patient complains of arthritis/joint pain, back pain, and sleeping problems.  The patient denies allergy/sinus, anemia, anxiety-new, blood in urine, breast changes/lumps, change in vision, confusion, cough, coughing up blood, depression-new,  fainting, fatigue, fever, headaches-new, hearing problems, heart murmur, heart rhythm changes, itching, menstrual pain, muscle  pains/cramps, night sweats, nosebleeds, pregnancy symptoms, shortness of breath, skin rash, sore throat, swelling of feet/legs, swollen lymph glands, thirst - excessive, urination - excessive, urination changes/pain, urine leakage, and voice change.    Vital Signs:  Patient profile:   77 year old female Height:      68 inches Weight:      150 pounds BMI:     22.89 Pulse rate:   68 / minute Pulse rhythm:   regular BP sitting:   114 / 80  (left arm) Cuff size:   regular  Vitals Entered By: Francee Piccolo CMA Duncan Dull) (May 02, 2010 10:23 AM)  Physical Exam  General:  Well developed, well nourished, no acute distress.healthy appearing.   Head:  Normocephalic and atraumatic. Eyes:  PERRLA, no icterus.exam deferred to patient's ophthalmologist.   Lungs:  Clear throughout to auscultation. Heart:  Regular rate and rhythm; no murmurs, rubs,  or bruits. Abdomen:  Soft, nontender and nondistended. No masses, hepatosplenomegaly or hernias noted. Normal bowel sounds. Rectal:  deferred until time of colonoscopy.   Extremities:  No clubbing, cyanosis, edema or deformities noted. Neurologic:  Alert and  oriented x4;  grossly normal neurologically. Psych:  Alert and cooperative. Normal mood and affect.   Impression & Recommendations:  Problem # 1:  CONSTIPATION (ICD-564.00) Assessment Unchanged Continue fiber substitutes as tolerated with liberal p.o. fluids. Followup colonoscopy has been scheduled for screening purposes at her convenience. We will hold Aggrenox 5 days before this procedure. I do not think that she is at increased risk for CVA with these precautions. She appears to be in a regular rhythm at this time and has no history of peripheral vascular disease, carotid artery disease and previous CVAs.  Problem # 2:  FAMILY HISTORY OF COLON CA 1ST DEGREE RELATIVE <60 (ICD-V16.0) Assessment: Unchanged  Problem # 3:  HYPERTENSION (ICD-401.9) Assessment: Improved Blood Pressure today is  114/80 and pulse was 68 and regular. She is to continue her other medications as per primary care.  Patient Instructions: 1)  Copy sent to : Dr. Trinna Post Plotnikov 2)  Please continue current medications.  3)  Constipation and Hemorrhoids brochure given.  4)  Colonoscopy and Flexible Sigmoidoscopy brochure given.  5)  Conscious Sedation brochure given.  6)  Vear Clock colon health probiotic tabs twice a day as tolerated 7)  Hold Aggrenox 5 days before colonoscopy procedure.  Appended Document: REC COL, AGGRENOX, SPEAKS RUSSIAN...AS.    Clinical Lists Changes  Medications: Added new medication of MOVIPREP 100 GM  SOLR (PEG-KCL-NACL-NASULF-NA ASC-C) As per prep instructions. - Signed Added new medication of [facility]IPS COLON HEALTH  CAPS (PROBIOTIC PRODUCT) two times a day Rx of MOVIPREP 100 GM  SOLR (PEG-KCL-NACL-NASULF-NA ASC-C) As per prep instructions.;  #1 x 0;  Signed;  Entered by: Ashok Cordia RN;  Authorized by: Mardella Layman MD [facility];  Method used: Electronically to [facility] [address]. [phone]*, [address] [place], [place], [place]  [address], Ph: [phone] or [phone], Fax: [phone] Orders: Added new Test order of Colonoscopy (Colon) - Signed    Prescriptions: MOVIPREP 100 GM  SOLR (PEG-KCL-NACL-NASULF-NA ASC-C) As per prep instructions.  #1 x 0   Entered by:   Ashok Cordia RN   Authorized by:   Mardella Layman MD [facility]   Signed by:   Ashok Cordia RN on 05/02/2010   Method used:   Electronically to  [facility] [address]* (retail)       [address]       [place], [place]  [address]       Ph: [phone] or [phone]       Fax: [phone]   RxID:   [id number]

## 2011-01-14 NOTE — Miscellaneous (Signed)
Summary: mammogram 2011   Clinical Lists Changes  Observations: Added new observation of MAMMOGRAM: normal (10/10/2010 11:57)      Preventive Care Screening  Mammogram:    Date:  10/10/2010    Results:  normal

## 2011-01-14 NOTE — Procedures (Signed)
Summary: Colonoscopy  Patient: Sophia Santos Note: All result statuses are Final unless otherwise noted.  Tests: (1) Colonoscopy (COL)   COL Colonoscopy           DONE     [facility]     [address]     [place], [place]  [address]           COLONOSCOPY PROCEDURE REPORT           PATIENT:  Sophia Santos, Sophia Santos  MR#:  [id number]     BIRTHDATE:  1934/03/02, 76 yrs. old  GENDER:  female     ENDOSCOPIST:  Vania Rea. Jarold Motto, MD, [facility]     REF. BY:     PROCEDURE DATE:  05/22/2010     PROCEDURE:  Colonoscopy with snare polypectomy     ASA CLASS:  Class II     INDICATIONS:  history of polyps     MEDICATIONS:   Fentanyl 50 mcg IV, Versed 6 mg IV           DESCRIPTION OF PROCEDURE:   After the risks benefits and     alternatives of the procedure were thoroughly explained, informed     consent was obtained.  Digital rectal exam was performed and     revealed no abnormalities.   The LB [id number] endoscope     was introduced through the anus and advanced to the cecum, which     was identified by both the appendix and ileocecal valve, without     limitations.  The quality of the prep was poor, using MoviPrep.     The instrument was then slowly withdrawn as the colon was fully     examined.     <<PROCEDUREIMAGES>>           FINDINGS:  Severe diverticulosis was found in the sigmoid to     descending colon segments.  A sessile polyp was found in the     ascending colon. 3-4 MM DIMINUTIVE POLYP COLD SNARE EXCISED.NO     TISSUE PER POOR PREP AND SEEDS.  This was otherwise a normal     examination of the colon.   Retroflexed views in the rectum     revealed no abnormalities.    The scope was then withdrawn from     the patient and the procedure completed.           COMPLICATIONS:  None     ENDOSCOPIC IMPRESSION:     1) Severe diverticulosis in the sigmoid to descending colon     segments     2) Sessile polyp in the ascending colon     3) Otherwise normal examination  RECOMMENDATIONS:     1) Follow up colonoscopy in 5 years     2) High fiber diet.     REPEAT EXAM:  No           ______________________________     Vania Rea. Jarold Motto, MD, [facility]           CC:  Linda Hedges. Plotnikov, MD           n.     Rosalie DoctorVania Rea. Patterson at 05/22/2010 11:05 AM           [name], [phone]  Note: An exclamation mark (!) indicates a result that was not dispersed into the flowsheet. Document Creation Date: 05/22/2010 11:06 AM _______________________________________________________________________  (1) Order result status: Final Collection or observation date-time: 05/22/2010 10:58 Requested  date-time:  Receipt date-time:  Reported date-time:  Referring Physician:   Ordering Physician: Sheryn Bison [phone]) Specimen Source:  Source: Launa Grill Order Number: [id number] Lab site:   Appended Document: Colonoscopy     Procedures Next Due Date:    Colonoscopy: 05/2015

## 2011-01-14 NOTE — Letter (Signed)
Summary: [facility] Instructions  [facility]  [address] [place], [place] [address]   Phone: [phone]  Fax: [phone]       Sophia Santos    Jun 22, 1934    MRN: [id number]        Procedure Day Dorna Bloom: Wednesday, 05/22/10     Arrival Time: 10:00      Procedure Time: 11:00     Location of Procedure:                    _X _  [facility] (4th Floor)                        PREPARATION FOR COLONOSCOPY WITH MOVIPREP   Starting 5 days prior to your procedure 05/17/10 do not eat nuts, seeds, popcorn, corn, beans, peas,  salads, or any raw vegetables.  Do not take any fiber supplements (e.g. Metamucil, Citrucel, and Benefiber).  THE DAY BEFORE YOUR PROCEDURE         DATE: 05/21/10  DAY: Tuesday  1.  Drink clear liquids the entire day-NO SOLID FOOD  2.  Do not drink anything colored red or purple.  Avoid juices with pulp.  No orange juice.  3.  Drink at least 64 oz. (8 glasses) of fluid/clear liquids during the day to prevent dehydration and help the prep work efficiently.  CLEAR LIQUIDS INCLUDE: Water Jello Ice Popsicles Tea (sugar ok, no milk/cream) Powdered fruit flavored drinks Coffee (sugar ok, no milk/cream) Gatorade Juice: apple, white grape, white cranberry  Lemonade Clear bullion, consomm, broth Carbonated beverages (any kind) Strained chicken noodle soup Hard Candy                             4.  In the morning, mix first dose of MoviPrep solution:    Empty 1 Pouch A and 1 Pouch B into the disposable container    Add lukewarm drinking water to the top line of the container. Mix to dissolve    Refrigerate (mixed solution should be used within 24 hrs)  5.  Begin drinking the prep at 5:00 p.m. The MoviPrep container is divided by 4 marks.   Every 15 minutes drink the solution down to the next mark (approximately 8 oz) until the full liter is complete.   6.  Follow completed prep with 16 oz of clear liquid of your choice (Nothing red  or purple).  Continue to drink clear liquids until bedtime.  7.  Before going to bed, mix second dose of MoviPrep solution:    Empty 1 Pouch A and 1 Pouch B into the disposable container    Add lukewarm drinking water to the top line of the container. Mix to dissolve    Refrigerate  THE DAY OF YOUR PROCEDURE      DATE: 05/22/10   DAY: Wednesday  Beginning at 6:00 a.m. (5 hours before procedure):         1. Every 15 minutes, drink the solution down to the next mark (approx 8 oz) until the full liter is complete.  2. Follow completed prep with 16 oz. of clear liquid of your choice.    3. You may drink clear liquids until 9:00  (2 HOURS BEFORE PROCEDURE).   MEDICATION INSTRUCTIONS  Unless otherwise instructed, you should take regular prescription medications with a small sip of water   as early as possible the morning  of your procedure.      Stop Aggrenox on 05/17/10.   Five days before your procedure.            OTHER INSTRUCTIONS  You will need a responsible adult at least 75 years of age to accompany you and drive you home.   This person must remain in the waiting room during your procedure.  Wear loose fitting clothing that is easily removed.  Leave jewelry and other valuables at home.  However, you may wish to bring a book to read or  an iPod/MP3 player to listen to music as you wait for your procedure to start.  Remove all body piercing jewelry and leave at home.  Total time from sign-in until discharge is approximately 2-3 hours.  You should go home directly after your procedure and rest.  You can resume normal activities the  day after your procedure.  The day of your procedure you should not:   Drive   Make legal decisions   Operate machinery   Drink alcohol   Return to work  You will receive specific instructions about eating, activities and medications before you leave.    The above instructions have been reviewed and explained to me by    _______________________    I fully understand and can verbalize these instructions _____________________________ Date _________

## 2011-01-14 NOTE — Assessment & Plan Note (Signed)
Summary: 4 MO ROV /NWS  #   Vital Signs:  Patient profile:   77 year old female Height:      68 inches Weight:      151 pounds BMI:     23.04 Temp:     97.7 degrees F oral Pulse rate:   88 / minute Pulse rhythm:   regular Resp:     16 per minute BP sitting:   130 / 76  (left arm) Cuff size:   regular  Vitals Entered By: Lanier Prude, Beverly Gust) (September 27, 2010 10:21 AM) CC: MWV Is Patient Diabetic? No   Primary Care Provider:  Sonda Primes, MD  CC:  MWV.  History of Present Illness: The patient presents for a preventive health examination  Patient past medical history, social history, and family history reviewed in detail no significant changes.  Patient is physically active. Depression is negative and mood is good. Hearing is normal, and able to perform activities of daily living. Risk of falling is negligible and home safety has been reviewed and is appropriate. Patient has normal height, weight, and visual acuity is nl w/glasses. Patient has been counseled on age-appropriate routine health concerns for screening and prevention. Education, counseling done.  The patient presents for a follow up of HAs, back pain, anxiety, HTN.   Preventive Screening-Counseling & Management  Alcohol-Tobacco     Alcohol drinks/day: 0     Smoking Status: never  Caffeine-Diet-Exercise     Caffeine Counseling: not indicated; caffeine use is not excessive or problematic     Diet Counseling: not indicated; diet is assessed to be healthy     Does Patient Exercise: yes     Depression Counseling: not indicated; screening negative for depression  Hep-HIV-STD-Contraception     Hepatitis Risk: no risk noted     HIV Risk Counseling: not indicated-no HIV risk noted     Sun Exposure Counseling: not indicated; sun exposure is acceptable  Safety-Violence-Falls     Seat Belt Use: yes     Violence in the Home: no risk noted     Fall Risk Counseling: not indicated; no significant falls  noted  Current Medications (verified): 1)  Captopril 50 Mg Tabs (Captopril) .Marland Kitchen.. 1 By Mouth Bid 2)  Lipitor 20 Mg  Tabs (Atorvastatin Calcium) .... Take 1 By Mouth Qd 3)  Butalbital-Apap-Caffeine 50-325-40 Mg  Tabs (Butalbital-Apap-Caffeine) .... Take 1 By Mouth Two Times A Day Prn 4)  Omeprazole 20 Mg Cpdr (Omeprazole) .... 2 Once Daily 5)  Vitamin D3 1000 Unit  Tabs (Cholecalciferol) .Marland Kitchen.. 1 Qd 6)  Inderal La 120 Mg Xr24h-Cap (Propranolol Hcl) .Marland Kitchen.. 1 By Mouth Q Am 7)  Hydrochlorothiazide 12.5 Mg Caps (Hydrochlorothiazide) .... Once Daily 8)  Aggrenox 25-200 Mg Xr12h-Cap (Aspirin-Dipyridamole) .Marland Kitchen.. 1 By Mouth Bid 9)  Phillips Colon Health  Caps (Probiotic Product) .... Two Times A Day 10)  Lorazepam 1 Mg Tabs (Lorazepam) .Marland Kitchen.. 1 By Mouth Two Times A Day As Needed Anxiety  Allergies (verified): 1)  Zocor 2)  Fosamax (Alendronate Sodium)  Past History:  Past Medical History: Last updated: 05/31/2010 GERD Hypertension Headache Hyperlipidemia Osteoarthritis Osteopenia Anxiety  Family History: Last updated: 05/02/2010 Family History of CAD Female 1st degree relative <50 Family History of Colon CA 1st degree relative <60--Mother M CVA  Social History: Last updated: 05/02/2010 Widowed, 1 boy Never Smoked Alcohol Use - no Daily Caffeine Use--tea Illicit Drug Use - no  Past Surgical History: egd (12/26/2002) Colon 2011 Dr Jarold Motto  Social History: Does  Patient Exercise:  yes Hepatitis Risk:  no risk noted Seat Belt Use:  yes  Review of Systems       The patient complains of headaches.  The patient denies weight gain, vision loss, prolonged cough, anorexia, fever, weight loss, decreased hearing, hoarseness, chest pain, syncope, dyspnea on exertion, peripheral edema, hemoptysis, abdominal pain, melena, hematochezia, severe indigestion/heartburn, hematuria, incontinence, genital sores, muscle weakness, suspicious skin lesions, transient blindness, difficulty walking,  depression, unusual weight change, abnormal bleeding, enlarged lymph nodes, angioedema, and breast masses.    Physical Exam  General:  Well-developed,well-nourished,in no acute distress; alert,appropriate and cooperative throughout examination Head:  Normocephalic and atraumatic without obvious abnormalities. No apparent alopecia or balding. Eyes:  No corneal or conjunctival inflammation noted. EOMI. Perrla. Funduscopic exam benign, without hemorrhages, exudates or papilledema. Vision grossly normal. Ears:  External ear exam shows no significant lesions or deformities.  Otoscopic examination reveals clear canals, tympanic membranes are intact bilaterally without bulging, retraction, inflammation or discharge. Hearing is grossly normal bilaterally. Nose:  External nasal examination shows no deformity or inflammation. Nasal mucosa are pink and moist without lesions or exudates. Mouth:  Oral mucosa and oropharynx without lesions or exudates.  Teeth in good repair. Neck:  No deformities, masses, or tenderness noted. Chest Wall:  No deformities, masses, or tenderness noted. Lungs:  Normal respiratory effort, chest expands symmetrically. Lungs are clear to auscultation, no crackles or wheezes. Heart:  Normal rate and regular rhythm. S1 and S2 normal without gallop, murmur, click, rub or other extra sounds. Abdomen:  Bowel sounds positive,abdomen soft and non-tender without masses, organomegaly or hernias noted. Msk:  No deformity or scoliosis noted of thoracic or lumbar spine.   Pulses:  R and L carotid,radial,femoral,dorsalis pedis and posterior tibial pulses are full and equal bilaterally Extremities:  No clubbing, cyanosis, edema, or deformity noted with normal full range of motion of all joints.   Neurologic:  No cranial nerve deficits noted. Station and gait are normal. Plantar reflexes are down-going bilaterally. DTRs are symmetrical throughout. Sensory, motor and coordinative functions appear  intact. Skin:  AK x 5 on face Cervical Nodes:  No lymphadenopathy noted Axillary Nodes:  No palpable lymphadenopathy Inguinal Nodes:  No significant adenopathy Psych:  Cognition and judgment appear intact. Alert and cooperative with normal attention span and concentration. No apparent delusions, illusions, hallucinations   Impression & Recommendations:  Problem # 1:  ROUTINE GENERAL MEDICAL EXAM@HEALTH  CARE FACL (ICD-V70.0) Assessment New Overall doing well, age appropriate education and counseling updated and referral for appropriate preventive services done unless declined, immunizations up to date or declined, diet counseling done if overweight, urged to quit smoking if smokes, most recent labs reviewed and current ordered if appropriate, ecg reviewed or declined (interpretation per ECG scanned in the EMR if done); information regarding Medicare Preventation requirements given if appropriate.  Colon 2011 Orders: EKG w/ Interpretation (93000) OK The labs were reviewed with the patient.   Problem # 2:  HEADACHE (ICD-784.0) Assessment: Deteriorated She was given Topamax - side effects. Shots in head made it worse. Taking Topamax 3 a day now per [facility].  Her updated medication list for this problem includes:    Butalbital-apap-caffeine 50-325-40 Mg Tabs (Butalbital-apap-caffeine) .Marland Kitchen... Take 1 by mouth two times a day as needed (she was taking 1 qhs) - she stopped per [facility]    Inderal La 120 Mg Xr24h-cap (Propranolol hcl) .Marland Kitchen... 1 by mouth q am  Problem # 3:  ANXIETY (ICD-300.00) Assessment: Unchanged  Her updated  medication list for this problem includes:    Lorazepam 1 Mg Tabs (Lorazepam) .Marland Kitchen... 1 by mouth two times a day as needed anxiety  Problem # 4:  HYPERLIPIDEMIA (ICD-272.4) Assessment: Comment Only  Her updated medication list for this problem includes:    Lipitor 20 Mg Tabs (Atorvastatin calcium) .Marland Kitchen... Take 1 by mouth qd  Labs Reviewed: SGOT: 24 (09/19/2010)   SGPT:  32 (09/19/2010)   HDL:49.70 (09/19/2010), 48.90 (05/23/2010)  LDL:115 (09/19/2010), 115 (05/23/2010)  Chol:187 (09/19/2010), 181 (05/23/2010)  Trig:113.0 (09/19/2010), 86.0 (05/23/2010)  Problem # 5:  ACTINIC KERATOSIS, HEAD (ICD-702.0) Assessment: Comment Only  Orders: Cryotherapy/Destruction benign or premalignant lesion (1st lesion)  (17000) Cryotherapy/Destruction benign or premalignant lesion (2nd-14th lesions) (17003) Procedure: cryo Indication: AK(s) Risks incl. scar(s), incomplete removal, ect.  and benefits discussed    5  lesion(s) on face was/were treated with liqid N2 in usual fasion.  Tolerated well. Compl. none. Wound care instructions given.  Complete Medication List: 1)  Captopril 50 Mg Tabs (Captopril) .Marland Kitchen.. 1 by mouth bid 2)  Lipitor 20 Mg Tabs (Atorvastatin calcium) .... Take 1 by mouth qd 3)  Omeprazole 20 Mg Cpdr (Omeprazole) .... 2 once daily 4)  Vitamin D3 1000 Unit Tabs (Cholecalciferol) .Marland Kitchen.. 1 qd 5)  Inderal La 120 Mg Xr24h-cap (Propranolol hcl) .Marland Kitchen.. 1 by mouth q am 6)  Aggrenox 25-200 Mg Xr12h-cap (Aspirin-dipyridamole) .Marland Kitchen.. 1 by mouth bid 7)  Lorazepam 1 Mg Tabs (Lorazepam) .Marland Kitchen.. 1 by mouth two times a day as needed anxiety 8)  Topamax 100 Mg Tabs (Topiramate) .Marland Kitchen.. 1 by mouth tid 9)  Nasacort Aq 55 Mcg/act Aers (Triamcinolone acetonide) .... 2 inh qd 10)  Zyrtec Allergy 10 Mg Tabs (Cetirizine hcl) .Marland Kitchen.. 1 by mouth qd 11)  Dulcolax 5 Mg Tbec (Bisacodyl) .Marland Kitchen.. 1-2 by mouth once daily as needed constipation  Other Orders: Medicare -1st Annual Wellness Visit [phone])  Patient Instructions: 1)  Please schedule a follow-up appointment in 6 months. 2)  BMP prior to visit, ICD-9: 3)  Hepatic Panel prior to visit, ICD-9:272.0 995.20 4)  Lipid Panel prior to visit, ICD-9:  Prescriptions: LORAZEPAM 1 MG TABS (LORAZEPAM) 1 by mouth two times a day as needed anxiety  #60 x 6   Entered and Authorized by:   Tresa Garter MD   Signed by:   Tresa Garter MD  on 09/27/2010   Method used:   Print then Give to Patient   RxID:   [id number] ZYRTEC ALLERGY 10 MG TABS (CETIRIZINE HCL) 1 by mouth qd  #30 x 11   Entered and Authorized by:   Tresa Garter MD   Signed by:   Tresa Garter MD on 09/27/2010   Method used:   Electronically to        [facility] [phone]* (retail)       [address] [place], [place]  [address]       Ph: [phone]       Fax: [phone]   RxID:   [id number] DULCOLAX 5 MG TBEC (BISACODYL) 1-2 by mouth once daily as needed constipation  #60 x 6   Entered and Authorized by:   Tresa Garter MD   Signed by:   Tresa Garter MD on 09/27/2010   Method used:   Electronically to        [facility] [phone]* (retail)       [address] [place]  [place], [place]  [address]       Ph: [phone]       Fax: [phone]   RxID:   [id number] NASACORT AQ 55 MCG/ACT AERS (TRIAMCINOLONE ACETONIDE) 2 inh qd  #1 x 11   Entered and Authorized by:   Tresa Garter MD   Signed by:   Tresa Garter MD on 09/27/2010   Method used:   Electronically to        [facility] # [phone]* (retail)       [address] [place], [place]  [address]       Ph: [phone]       Fax: [phone]   RxID:   [id number] AGGRENOX 25-200 MG XR12H-CAP (ASPIRIN-DIPYRIDAMOLE) 1 by mouth bid  #60 x 12   Entered and Authorized by:   Tresa Garter MD   Signed by:   Tresa Garter MD on 09/27/2010   Method used:   Electronically to        [facility] # [phone]* (retail)       [address] [place], [place]  [address]       Ph: [phone]       Fax: [phone]   RxID:   [id number] INDERAL LA 120 MG XR24H-CAP (PROPRANOLOL HCL) 1 by mouth q am  #30 x 12   Entered and Authorized by:   Tresa Garter MD   Signed by:   Tresa Garter MD on 09/27/2010   Method used:   Electronically to        [facility] # [phone]* (retail)       [address] [place], [place]  [address]       Ph: [phone]       Fax: [phone]   RxID:   [id number] OMEPRAZOLE 20 MG CPDR (OMEPRAZOLE) 2 once daily  #60 Capsule x 12   Entered and Authorized by:   Tresa Garter MD   Signed by:   Tresa Garter MD on 09/27/2010   Method used:   Electronically to        [facility] # [phone]* (retail)       [address] [place], [place]  [address]       Ph: [phone]       Fax: [phone]   RxID:   [id number] LIPITOR 20 MG  TABS (ATORVASTATIN CALCIUM) TAKE 1 by mouth QD  #30 x 12   Entered and Authorized by:   Tresa Garter MD   Signed by:   Tresa Garter MD on 09/27/2010   Method used:   Electronically to        [facility] # [phone]* (retail)       [address] [place], [place]  [address]       Ph: [phone]       Fax: [phone]   RxID:   [id number] CAPTOPRIL 50 MG TABS (CAPTOPRIL) 1 by mouth bid  #60 x 12   Entered and Authorized by:   Tresa Garter MD   Signed by:   Tresa Garter MD on 09/27/2010   Method used:   Electronically to        [facility] # [phone]* (retail)  [address] [place], [place]  [address]       Ph: [phone]       Fax: [phone]   RxID:   [id number] DULCOLAX 5 MG TBEC (BISACODYL) 1-2 by mouth once daily as needed constipation  #60 x 6   Entered and Authorized by:   Tresa Garter MD   Signed by:   Tresa Garter MD on 09/27/2010   Method used:     RxID:   [id number] AGGRENOX 25-200 MG XR12H-CAP (ASPIRIN-DIPYRIDAMOLE) 1 by mouth bid  #60 x 12   Entered and Authorized by:   Tresa Garter MD   Signed by:   Tresa Garter MD on 09/27/2010   Method used:     RxID:   [id number] INDERAL LA 120 MG XR24H-CAP (PROPRANOLOL HCL) 1 by mouth q am  #30 x 12   Entered and Authorized by:   Tresa Garter MD   Signed by:   Tresa Garter MD on 09/27/2010   Method used:      RxID:   [id number] OMEPRAZOLE 20 MG CPDR (OMEPRAZOLE) 2 once daily  #60 Capsule x 12   Entered and Authorized by:   Tresa Garter MD   Signed by:   Tresa Garter MD on 09/27/2010   Method used:     RxID:   [id number] LIPITOR 20 MG  TABS (ATORVASTATIN CALCIUM) TAKE 1 by mouth QD  #30 x 12   Entered and Authorized by:   Tresa Garter MD   Signed by:   Tresa Garter MD on 09/27/2010   Method used:     RxID:   [id number] CAPTOPRIL 50 MG TABS (CAPTOPRIL) 1 by mouth bid  #60 x 12   Entered and Authorized by:   Tresa Garter MD   Signed by:   Tresa Garter MD on 09/27/2010   Method used:     RxID:   [id number] ZYRTEC ALLERGY 10 MG TABS (CETIRIZINE HCL) 1 by mouth qd  #30 x 11   Entered and Authorized by:   Tresa Garter MD   Signed by:   Tresa Garter MD on 09/27/2010   Method used:     RxID:   [id number] NASACORT AQ 55 MCG/ACT AERS (TRIAMCINOLONE ACETONIDE) 2 inh qd  #1 x 11   Entered and Authorized by:   Tresa Garter MD   Signed by:   Tresa Garter MD on 09/27/2010   Method used:     RxID:   [id number]

## 2011-01-14 NOTE — Progress Notes (Signed)
Summary: LAB REQUEST  Phone Note Call from Patient Call back at Home Phone [phone]   Caller: Patient Summary of Call: Pt came into the office requesting a lab work for today. Pt is here in the office waiting.  Initial call taken by: Livingston Diones,  January 18, 2010 9:06 AM  Follow-up for Phone Call        Erie Noe has taken care of. No further action needed. Follow-up by: Lucious Groves,  January 18, 2010 10:08 AM

## 2011-01-14 NOTE — Procedures (Signed)
Summary: EGD   EGD  Procedure date:  12/26/2002  Findings:      Location: [facility]    EGD  Procedure date:  12/26/2002  Findings:      Location: [facility]   Patient Name: Sophia Santos, Sophia Santos MRN:  Procedure Procedures: Panendoscopy (EGD) CPT: 43235.    with biopsy(s)/brushing(s). CPT: D1846139.    with esophageal dilation. CPT: G9296129.  Personnel: Endoscopist: Vania Rea. Jarold Motto, MD.  Exam Location: Exam performed in Outpatient Clinic. Outpatient  Patient Consent: Procedure, Alternatives, Risks and Benefits discussed, consent obtained, from patient. Consent was obtained by the RN.  Indications Symptoms: Dysphagia. Reflux symptoms for >10 yrs, occurring 3-6 times/wk.  History  Pre-Exam Physical: Performed Dec 26, 2002  Cardio-pulmonary exam, Abdominal exam, Extremity exam, Mental status exam WNL.  Exam Exam Info: Maximum depth of insertion Duodenum, intended Duodenum. Patient position: on left side. Duration of exam: 15 minutes. Vocal cords visualized. Gastric retroflexion performed. Images taken. ASA Classification: I. Tolerance: excellent.  Sedation Meds: Patient assessed and found to be appropriate for moderate (conscious) sedation. Fentanyl 50 mcg. given IV. Versed 5 mg. given IV. Cetacaine Spray 1 sprays given aerosolized.  Monitoring: BP and pulse monitoring done. Oximetry used. Supplemental O2 given at 2 Liters.  Instrument(s): GIF 140. Serial [id number].   Findings - STRICTURE / STENOSIS: Distal Esophagus.  Constriction: partial. Lumen diameter is 14 mm. ICD9: Esophageal Stricture: 530.3.  - Dilation: Distal Esophagus. for esophageal stricture. Maloney dilator used, Diameter: 17 mm, Minimal Resistance, No Heme present on extraction. 1  total dilators used. Patient tolerance excellent. Outcome: successful.  - HIATAL HERNIA: Prolapsing, 3 cms. in length. ICD9: Hernia, Hiatal: 553.3. - DIAGNOSTIC TEST: from Body to Antrum. RUT  done, results pending   Assessment  Diagnoses: 553.3: Hernia, Hiatal. GERD.  530.3: Esophageal Stricture.   Events  Unplanned Intervention: No unplanned interventions were required.  Plans Medication(s): PPI: Esomeprazole/Nexium 40 mg BID, starting Dec 26, 2002 for 4 wks.   Patient Education: Patient given standard instructions for: Reflux. Stenosis / Stricture. Soft diet for 24 hours.  Disposition: After procedure patient sent to recovery. After recovery patient sent home.  Scheduling: Clinic Visit, to Vania Rea. Jarold Motto, MD, around Jan 26, 2003.    This report was created from the original endoscopy report, which was reviewed and signed by the above listed endoscopist.

## 2011-01-14 NOTE — Miscellaneous (Signed)
Summary: Lec previsit  Clinical Lists Changes     Pt came in today for the previsit prior to the colonoscopy on 04/22/10 with Dr  Jarold Motto .Pt speaks [place] and there was a language barrier trying to explain the prep,  the procedure, and the pt also was on Aggrenox(-blood thinner ).She  said she would stop the Aggrenox on her own.Tried to explain to pt she could not do this, and she got very frustrated. Spoke with Jill Side at front desk and she got an interpretor on the phone to try to explain things to pt.Her appt on 04/22/10 for the colonoscopy was cancelled and she was rescheduled  for an appt with Dr Jarold Motto  on 05/02/10 in the office and an interpretor was scheduled to be here that day also.

## 2011-01-14 NOTE — Miscellaneous (Signed)
Summary: BONE DENSITY  Clinical Lists Changes  Orders: Added new Test order of T-Bone Densitometry (77080) - Signed Added new Test order of T-Lumbar Vertebral Assessment (77082) - Signed 

## 2011-01-14 NOTE — Progress Notes (Signed)
Summary: Voltaren ?  Phone Note From Pharmacy   Caller: [facility] Summary of Call: rec fax from pharm requesting specific instructions on Voltaren( how many grams/day per how many joints/day?).  The quantity written was 400gm(4 tubes)....Marland KitchenMarland KitchenSo, how many days is ONE TUBE good for? Initial call taken by: Lanier Prude, [facility]),  November 20, 2010 1:50 PM  Follow-up for Phone Call        4 g qid on a knee joint as needed Thank you!  Follow-up by: Tresa Garter MD,  November 20, 2010 5:34 PM    New/Updated Medications: VOLTAREN 1 % GEL (DICLOFENAC SODIUM) 4 grams four times a day on knee joint as needed Prescriptions: VOLTAREN 1 % GEL (DICLOFENAC SODIUM) 4 grams four times a day on knee joint as needed  #1 mth x 6   Entered by:   Lamar Sprinkles, CMA   Authorized by:   Tresa Garter MD   Signed by:   Lamar Sprinkles, CMA on 11/20/2010   Method used:   Electronically to        [facility] # [phone]* (retail)       [address] [place], [place]  [address]       Ph: [phone]       Fax: [phone]   RxID:   [id number]

## 2011-01-14 NOTE — Assessment & Plan Note (Signed)
Summary: 4 MO ROV /NWS  #   Vital Signs:  Patient profile:   77 year old female Height:      68 inches Weight:      149.13 pounds BMI:     22.76 O2 Sat:      97 % on Room air Temp:     97.3 degrees F oral Pulse rate:   60 / minute BP sitting:   104 / 66  (left arm) Cuff size:   regular  Vitals Entered By: Lucious Groves (May 31, 2010 9:56 AM)  O2 Flow:  Room air CC: 4 MO RTN OV./KB Is Patient Diabetic? No Pain Assessment Patient in pain? no      Comments Patient notes that she is not taking the Moviprep./kb   Primary Care Provider:  Sonda Primes, MD  CC:  4 MO RTN OV./KB.  History of Present Illness: The patient presents for a follow up of hypertension, HAs and anxiety, isomnia, hyperlipidemia   Current Medications (verified): 1)  Captopril 50 Mg Tabs (Captopril) .Marland Kitchen.. 1 By Mouth Bid 2)  Lipitor 20 Mg  Tabs (Atorvastatin Calcium) .... Take 1 By Mouth Qd 3)  Butalbital-Apap-Caffeine 50-325-40 Mg  Tabs (Butalbital-Apap-Caffeine) .... Take 1 By Mouth Qd 4)  Lorazepam 0.5 Mg  Tabs (Lorazepam) .... Take 1 By Mouth Qd 5)  Omeprazole 20 Mg Cpdr (Omeprazole) .... 2 Once Daily 6)  Vitamin D3 1000 Unit  Tabs (Cholecalciferol) .Marland Kitchen.. 1 Qd 7)  Inderal La 120 Mg Xr24h-Cap (Propranolol Hcl) .Marland Kitchen.. 1 By Mouth Q Am 8)  Hydrochlorothiazide 12.5 Mg Caps (Hydrochlorothiazide) .... Once Daily 9)  Aggrenox 25-200 Mg Xr12h-Cap (Aspirin-Dipyridamole) .Marland Kitchen.. 1 By Mouth Bid 10)  Moviprep 100 Gm  Solr (Peg-Kcl-Nacl-Nasulf-Na Asc-C) .... As Per Prep Instructions. 11)  Phillips Colon Health  Caps (Probiotic Product) .... Two Times A Day  Allergies (verified): 1)  Zocor 2)  Fosamax (Alendronate Sodium)  Past History:  Past Surgical History: Last updated: 09/06/2007 egd (12/26/2002)  Social History: Last updated: 05/02/2010 Widowed, 1 boy Never Smoked Alcohol Use - no Daily Caffeine Use--tea Illicit Drug Use - no  Past Medical  History: GERD Hypertension Headache Hyperlipidemia Osteoarthritis Osteopenia Anxiety  Review of Systems  The patient denies weight loss, weight gain, chest pain, prolonged cough, hemoptysis, and melena.    Physical Exam  General:  Well-developed,well-nourished,in no acute distress; alert,appropriate and cooperative throughout examination Ears:  External ear exam shows no significant lesions or deformities.  Otoscopic examination reveals clear canals, tympanic membranes are intact bilaterally without bulging, retraction, inflammation or discharge. Hearing is grossly normal bilaterally. Nose:  External nasal examination shows no deformity or inflammation. Nasal mucosa are pink and moist without lesions or exudates. Mouth:  Oral mucosa and oropharynx without lesions or exudates.  Teeth in good repair. Neck:  No deformities, masses, or tenderness noted. Lungs:  Normal respiratory effort, chest expands symmetrically. Lungs are clear to auscultation, no crackles or wheezes. Heart:  Normal rate and regular rhythm. S1 and S2 normal without gallop, murmur, click, rub or other extra sounds. Abdomen:  Bowel sounds positive,abdomen soft and non-tender without masses, organomegaly or hernias noted. Msk:  No deformity or scoliosis noted of thoracic or lumbar spine.   Extremities:  No clubbing, cyanosis, edema, or deformity noted with normal full range of motion of all joints.   Neurologic:  No cranial nerve deficits noted. Station and gait are normal. Plantar reflexes are down-going bilaterally. DTRs are symmetrical throughout. Sensory, motor and coordinative functions appear intact. Skin:  Intact without suspicious lesions or rashes Psych:  Cognition and judgment appear intact. Alert and cooperative with normal attention span and concentration. No apparent delusions, illusions, hallucinations   Impression & Recommendations:  Problem # 1:  HYPERLIPIDEMIA (ICD-272.4) Assessment Improved  Her  updated medication list for this problem includes:    Lipitor 20 Mg Tabs (Atorvastatin calcium) .Marland Kitchen... Take 1 by mouth qd  Problem # 2:  GERD (ICD-530.81) Assessment: Unchanged  Her updated medication list for this problem includes:    Omeprazole 20 Mg Cpdr (Omeprazole) .Marland Kitchen... 2 once daily  Problem # 3:  HEADACHE (ICD-784.0) Assessment: Deteriorated  Her updated medication list for this problem includes:    Butalbital-apap-caffeine 50-325-40 Mg Tabs (Butalbital-apap-caffeine) .Marland Kitchen... Take 1 by mouth two times a day prn    Inderal La 120 Mg Xr24h-cap (Propranolol hcl) .Marland Kitchen... 1 by mouth q am  Problem # 4:  ANXIETY (ICD-300.00) Assessment: Deteriorated  The following medications were removed from the medication list:    Lorazepam 0.5 Mg Tabs (Lorazepam) .Marland Kitchen... Take 1 by mouth qd Her updated medication list for this problem includes:    Lorazepam 1 Mg Tabs (Lorazepam) .Marland Kitchen... 1 by mouth two times a day as needed anxiety  Problem # 5:  ABNORMAL GLUCOSE NEC (ICD-790.29) Assessment: Comment Only  Labs Reviewed: Creat: 0.8 (05/23/2010)     Problem # 6:  OSTEOARTHRITIS (ICD-715.90) Assessment: Unchanged  Her updated medication list for this problem includes:    Butalbital-apap-caffeine 50-325-40 Mg Tabs (Butalbital-apap-caffeine) .Marland Kitchen... Take 1 by mouth two times a day prn  Complete Medication List: 1)  Captopril 50 Mg Tabs (Captopril) .Marland Kitchen.. 1 by mouth bid 2)  Lipitor 20 Mg Tabs (Atorvastatin calcium) .... Take 1 by mouth qd 3)  Butalbital-apap-caffeine 50-325-40 Mg Tabs (Butalbital-apap-caffeine) .... Take 1 by mouth two times a day prn 4)  Omeprazole 20 Mg Cpdr (Omeprazole) .... 2 once daily 5)  Vitamin D3 1000 Unit Tabs (Cholecalciferol) .Marland Kitchen.. 1 qd 6)  Inderal La 120 Mg Xr24h-cap (Propranolol hcl) .Marland Kitchen.. 1 by mouth q am 7)  Hydrochlorothiazide 12.5 Mg Caps (Hydrochlorothiazide) .... Once daily 8)  Aggrenox 25-200 Mg Xr12h-cap (Aspirin-dipyridamole) .Marland Kitchen.. 1 by mouth bid 9)  Phillips Colon  Health Caps (Probiotic product) .... Two times a day 10)  Lorazepam 1 Mg Tabs (Lorazepam) .Marland Kitchen.. 1 by mouth two times a day as needed anxiety  Patient Instructions: 1)  Please schedule a follow-up appointment in 4 months well w/las. Prescriptions: BUTALBITAL-APAP-CAFFEINE 50-325-40 MG  TABS (BUTALBITAL-APAP-CAFFEINE) TAKE 1 by mouth two times a day prn  #60 x 6   Entered and Authorized by:   Tresa Garter MD   Signed by:   Tresa Garter MD on 05/31/2010   Method used:   Print then Give to Patient   RxID:   [id number] LORAZEPAM 1 MG TABS (LORAZEPAM) 1 by mouth two times a day as needed anxiety  #60 x 6   Entered and Authorized by:   Tresa Garter MD   Signed by:   Tresa Garter MD on 05/31/2010   Method used:   Print then Give to Patient   RxID:   [id number]

## 2011-01-14 NOTE — Progress Notes (Signed)
Summary: Arthritis pain   Phone Note Call from Patient   Summary of Call: letter: OA flare-up, needs mor Voltaren gel Initial call taken by: Tresa Garter MD,  November 15, 2010 1:43 PM  Follow-up for Phone Call        meloxicam OK more voltaren gel Follow-up by: Tresa Garter MD,  November 15, 2010 1:43 PM  Additional Follow-up for Phone Call Additional follow up Details #1::        Pt informed  Additional Follow-up by: Lamar Sprinkles, CMA,  November 15, 2010 3:40 PM    New/Updated Medications: MELOXICAM 15 MG TABS (MELOXICAM) 1 by mouth once daily pc for arthritis Prescriptions: VOLTAREN 1 % GEL (DICLOFENAC SODIUM) use two times a day on a joint(s)  #4 tubes x 3   Entered by:   Lamar Sprinkles, CMA   Authorized by:   Tresa Garter MD   Signed by:   Lamar Sprinkles, CMA on 11/15/2010   Method used:   Electronically to        [facility] # [phone]* (retail)       [address] [place], [place]  [address]       Ph: [phone]       Fax: [phone]   RxID:   [id number] MELOXICAM 15 MG TABS (MELOXICAM) 1 by mouth once daily pc for arthritis  #30 x 6   Entered by:   Lamar Sprinkles, CMA   Authorized by:   Tresa Garter MD   Signed by:   Lamar Sprinkles, CMA on 11/15/2010   Method used:   Electronically to        [facility] # [phone]* (retail)       [address] [place], [place]  [address]       Ph: [phone]       Fax: [phone]   RxID:   [id number]

## 2011-01-14 NOTE — Progress Notes (Signed)
  Phone Note Other Incoming   Summary of Call: knee oa Initial call taken by: Tresa Garter MD,  November 11, 2010 9:52 AM    New/Updated Medications: VOLTAREN 1 % GEL (DICLOFENAC SODIUM) use two times a day on a joint(s) Prescriptions: VOLTAREN 1 % GEL (DICLOFENAC SODIUM) use two times a day on a joint(s)  #90 g x 3   Entered and Authorized by:   Tresa Garter MD   Signed by:   Tresa Garter MD on 11/11/2010   Method used:     RxID:   [id number]

## 2011-01-14 NOTE — Letter (Signed)
Summary: Dorris Fetch MD  Dorris Fetch MD   Imported By: Lester Wahoo 07/30/2010 09:03:48  _____________________________________________________________________  External Attachment:    Type:   Image     Comment:   External Document

## 2011-01-14 NOTE — Procedures (Signed)
Summary: Colon   Colonoscopy  Procedure date:  04/11/2002  Findings:      Location:  [facility].    Colonoscopy  Procedure date:  04/11/2002  Findings:      Location:  [facility].   Patient Name: Sophia Santos, Sophia Santos MRN:  Procedure Procedures: Colonoscopy CPT: [phone].  Personnel: Endoscopist: Vania Rea. Jarold Motto, MD.  Referred By: Linda Hedges Plotnikov, MD.  Exam Location: Exam performed in Outpatient Clinic. Outpatient  Patient Consent: Procedure, Alternatives, Risks and Benefits discussed, consent obtained, from patient. Consent was obtained by the RN.  Indications  Average Risk Screening Routine.  History  Pre-Exam Physical: Performed Apr 11, 2002. Cardio-pulmonary exam, Rectal exam, Abdominal exam, Extremity exam, Mental status exam WNL.  Exam Exam: Extent of exam reached: Cecum, extent intended: Cecum.  The cecum was identified by appendiceal orifice and IC valve. Patient position: on left side. Duration of exam: 20 minutes. Colon retroflexion performed. Images taken. ASA Classification: I. Tolerance: excellent.  Monitoring: Pulse and BP monitoring, Oximetry used. Supplemental O2 given.  Colon Prep Used Fleets Prep Kit for colon prep. Prep results: fair, adequate exam.  Sedation Meds: Patient assessed and found to be appropriate for moderate (conscious) sedation. Fentanyl 50 mcg. given IV. Versed 3 mg. given IV.  Instrument(s): CF 140L. Serial [id number].  Findings - NORMAL EXAM: Cecum to Sigmoid Colon. Not Seen: Polyps. Colitis. Tumors. Crohn's. Diverticulosis.  - HEMORRHOIDS: Internal. Size: Medium. Not bleeding. Not thrombosed. ICD9: Hemorrhoids, Internal: 455.0.   Assessment  Diagnoses: 455.0: Hemorrhoids, Internal.   Comments: No polyps noted!!! Events  Unplanned Interventions: No intervention was required.  Plans Medication Plan: Continue current medications.  Patient Education: Patient given standard  instructions for: Patient instructed to get routine colonoscopy every 5 years.  Disposition: After procedure patient sent to recovery. After recovery patient sent home.  Scheduling/Referral: Follow-Up prn.    cc: Linda Hedges. Platnikov, MD   This report was created from the original endoscopy report, which was reviewed and signed by the above listed endoscopist.

## 2011-03-18 ENCOUNTER — Other Ambulatory Visit (INDEPENDENT_AMBULATORY_CARE_PROVIDER_SITE_OTHER): Payer: Medicare Other

## 2011-03-18 DIAGNOSIS — T887XXA Unspecified adverse effect of drug or medicament, initial encounter: Secondary | ICD-10-CM

## 2011-03-18 DIAGNOSIS — E7800 Pure hypercholesterolemia, unspecified: Secondary | ICD-10-CM

## 2011-03-19 LAB — HEPATIC FUNCTION PANEL
ALT: 16 U/L (ref 0–35)
AST: 17 U/L (ref 0–37)
Alkaline Phosphatase: 67 U/L (ref 39–117)
Bilirubin, Direct: 0.1 mg/dL (ref 0.0–0.3)
Total Bilirubin: 0.5 mg/dL (ref 0.3–1.2)

## 2011-03-19 LAB — BASIC METABOLIC PANEL
BUN: 18 mg/dL (ref 6–23)
Chloride: 109 mEq/L (ref 96–112)
GFR: 66.58 mL/min (ref >60.00)
Potassium: 4.5 mEq/L (ref 3.5–5.1)
Sodium: 141 mEq/L (ref 135–145)

## 2011-03-19 LAB — LIPID PANEL
LDL Cholesterol: 112 mg/dL — ABNORMAL HIGH (ref 0–99)
Total CHOL/HDL Ratio: 4
VLDL: 18.2 mg/dL (ref 0.0–40.0)

## 2011-03-25 ENCOUNTER — Encounter: Payer: Self-pay | Admitting: Internal Medicine

## 2011-03-25 ENCOUNTER — Ambulatory Visit (INDEPENDENT_AMBULATORY_CARE_PROVIDER_SITE_OTHER): Payer: Medicaid Other | Admitting: Internal Medicine

## 2011-03-25 DIAGNOSIS — I10 Essential (primary) hypertension: Secondary | ICD-10-CM

## 2011-03-25 DIAGNOSIS — F411 Generalized anxiety disorder: Secondary | ICD-10-CM

## 2011-03-25 DIAGNOSIS — L570 Actinic keratosis: Secondary | ICD-10-CM

## 2011-03-25 DIAGNOSIS — E785 Hyperlipidemia, unspecified: Secondary | ICD-10-CM

## 2011-03-25 DIAGNOSIS — M899 Disorder of bone, unspecified: Secondary | ICD-10-CM

## 2011-03-25 DIAGNOSIS — R7309 Other abnormal glucose: Secondary | ICD-10-CM

## 2011-03-25 MED ORDER — ALPRAZOLAM 0.5 MG PO TABS: 0.5000 mg | ORAL_TABLET | Freq: Three times a day (TID) | ORAL | Status: DC | PRN

## 2011-03-25 MED ORDER — BUTALBITAL-APAP-CAFFEINE 50-325-40 MG PO TABS: 1.0000 | ORAL_TABLET | Freq: Two times a day (BID) | ORAL | Status: AC | PRN

## 2011-03-25 NOTE — Assessment & Plan Note (Signed)
On Vit D 

## 2011-03-25 NOTE — Assessment & Plan Note (Signed)
I suggested shave bx - she refused. We can start with Cryo. If lesions come back - will bx.   Procedure Note :     Procedure : Cryosurgery   Indication:  Actinic keratosis(es)   Risks including unsuccessful procedure , bleeding, infection, bruising, scar, a need for a repeat  procedure and others were explained to the patient in detail as well as the benefits. Informed consent was obtained verbally.    2 lesion(s)  On R face    was/were treated with liquid nitrogen on a Q-tip in a usual fasion . Band-Aid was applied and antibiotic ointment was given for a later use.   Tolerated well. Complications none.   Postprocedure instructions :     Keep the wounds clean. You can wash them with liquid soap and water. Pat dry with gauze or a Kleenex tissue  Before applying antibiotic ointment and a Band-Aid.   You need to report immediately  if  any signs of infection develop.

## 2011-03-25 NOTE — Assessment & Plan Note (Signed)
On Rx 

## 2011-03-25 NOTE — Assessment & Plan Note (Signed)
We are watching 

## 2011-03-25 NOTE — Progress Notes (Signed)
Subjective:    [name], female    DOB: 03-13-1934, 77 y.o.   MRN: [id number]  HPI   The patient is here to follow up on chronic  anxiety, OA symptoms controlled with medicines, diet and exercise. F/u HTN and hyperlipidemia.   Review of Systems  Constitutional: Negative for fatigue.  Eyes: Negative for redness.  Respiratory: Negative for cough.   Cardiovascular: Negative for palpitations and leg swelling.  Genitourinary: Negative for dysuria.  Musculoskeletal: Negative for back pain and joint swelling. Arthralgias: knees.  Skin: Negative for wound.  Neurological: Negative for dizziness and weakness.  Psychiatric/Behavioral: Negative for sleep disturbance and dysphoric mood.       Objective:   Physical Exam  Constitutional: She appears well-developed and well-nourished. No distress.  HENT:  Head: Normocephalic.  Right Ear: External ear normal.  Left Ear: External ear normal.  Nose: Nose normal.  Mouth/Throat: Oropharynx is clear and moist.  Eyes: Conjunctivae are normal. Pupils are equal, round, and reactive to light. Right eye exhibits no discharge. Left eye exhibits no discharge.  Neck: Normal range of motion. Neck supple. No JVD present. No tracheal deviation present. No thyromegaly present.  Cardiovascular: Normal rate, regular rhythm and normal heart sounds.   Pulmonary/Chest: No stridor. No respiratory distress. She has no wheezes.  Abdominal: Soft. Bowel sounds are normal. She exhibits no distension and no mass. There is no tenderness. There is no rebound and no guarding.  Musculoskeletal: She exhibits no edema and no tenderness.  Lymphadenopathy:    She has no cervical adenopathy.  Neurological: She displays normal reflexes. No cranial nerve deficit. She exhibits normal muscle tone. Coordination normal.  Skin: No rash noted. No erythema.       AKs x 2 on R face  Psychiatric: She has a normal mood and affect. Her behavior is normal. Judgment and thought  content normal.          Assessment & Plan:   HYPERLIPIDEMIA On Rx  ABNORMAL GLUCOSE NEC We are watching  ANXIETY On Rx prn  HYPERTENSION On rx  OSTEOPENIA On Vit D  ACTINIC KERATOSIS, HEAD I suggested shave bx - she refused. We can start with Cryo. If lesions come back - will bx.   Procedure Note :     Procedure : Cryosurgery   Indication:  Actinic keratosis(es)   Risks including unsuccessful procedure , bleeding, infection, bruising, scar, a need for a repeat  procedure and others were explained to the patient in detail as well as the benefits. Informed consent was obtained verbally.    2 lesion(s)  On R face    was/were treated with liquid nitrogen on a Q-tip in a usual fasion . Band-Aid was applied and antibiotic ointment was given for a later use.   Tolerated well. Complications none.   Postprocedure instructions :     Keep the wounds clean. You can wash them with liquid soap and water. Pat dry with gauze or a Kleenex tissue  Before applying antibiotic ointment and a Band-Aid.   You need to report immediately  if  any signs of infection develop.       OA Handicapped form was filled out  Lab Results  Component Value Date   WBC 4.8 09/19/2010   HGB 11.7* 09/19/2010   HGB NEGATIVE 09/19/2010   HCT 33.8* 09/19/2010   PLT 247.0 09/19/2010   CHOL 175 03/18/2011   TRIG 91.0 03/18/2011   HDL 45.00 03/18/2011   ALT 16 03/18/2011  AST 17 03/18/2011   NA 141 03/18/2011   K 4.5 03/18/2011   CL 109 03/18/2011   CREATININE 0.9 03/18/2011   BUN 18 03/18/2011   CO2 27 03/18/2011   TSH 1.30 09/19/2010   HGBA1C 5.9 09/29/2007

## 2011-03-25 NOTE — Assessment & Plan Note (Signed)
On Rx prn 

## 2011-03-25 NOTE — Assessment & Plan Note (Signed)
On rx 

## 2011-05-02 NOTE — Assessment & Plan Note (Signed)
[facility]                             PRIMARY CARE OFFICE NOTE   YANNI, RUBERG                          MRN:          [id number]  DATE:10/06/2006                            DOB:          [birth year]    PROCEDURE:  Incision and drainage abscess right big toe paronychia.  Risks,  including further infection, bleeding, pain and others as well as benefits  explained to patient in detail.  She gave a verbal consent.  The area was  prepped with Betadine and alcohol, and a 1-cm incision was done along the  edge of the abscess.  About 1 cc of purulent liquid was extracted.  Complications:  None.  Telfa pad with antibiotic ointment applied, covered  with gauze and wrapped with an Ace wrap.            ______________________________  [name]. Plotnikov, MD      AVP/MedQ  DD:  10/06/2006  DT:  10/07/2006  Job #:  [id number]

## 2011-05-02 NOTE — Procedures (Signed)
[facility]                            ABDOMINAL ULTRASOUND REPORT   Sophia Santos, Sophia Santos                          MRN:          [id number]  DATE:08/26/2006                            DOB:          [birth year]    ACCESSION #:  [id number]   INDICATIONS:  Abnormal liver function test.   PROCEDURE:  Multiplanar abdominal ultrasound imaging was performed in the  upright, supine, right and left lateral decubitus positions.   RESULTS:  Abdominal aorta 1.7 cm.  The IVC is patent.   The pancreas appears normal throughout the head, body and tail without  evidence of ductal dilatation, pancreatic masses, or peripancreatic  inflammation.   Gallbladder is well distended, thin walled 2.1 mm, with no pericholecystic  fluid or intraluminal echogenic foci to suggest gallstone disease.   The common bile duct measures 6.9 mm in maximal diameter without evidence of  intraluminal foci.   The liver appears normal without evidence of parenchymal lesion, ductal  dilatation or vascular abnormality.   Right kidney 85 mm, left kidneys are 86 mm.   Spleen is normal in size, measuring 88 cm without parenchymal lesion.   IMPRESSION:  Normal upper abdominal ultrasound of the liver, pancreas,  gallbladder and bile ducts.                                   Hedwig Morton. Juanda Chance, MD   DMB/MedQ  DD:  08/26/2006  DT:  08/27/2006  Job #:  [id number]   cc:   [name]. Plotnikov, MD

## 2011-06-11 ENCOUNTER — Telehealth: Payer: Self-pay | Admitting: *Deleted

## 2011-06-11 NOTE — Telephone Encounter (Signed)
rec rf req for alprazolam 0.5mg  1 po tid prn # 60. Last filled 05-16-11 .ok to Rf?

## 2011-06-11 NOTE — Telephone Encounter (Signed)
OK to fill this prescription with additional refills x2 Thank you!  

## 2011-06-12 MED ORDER — ALPRAZOLAM 0.5 MG PO TABS: 0.5000 mg | ORAL_TABLET | Freq: Three times a day (TID) | ORAL | Status: DC | PRN

## 2011-07-25 ENCOUNTER — Ambulatory Visit (INDEPENDENT_AMBULATORY_CARE_PROVIDER_SITE_OTHER): Payer: Medicare Other | Admitting: Internal Medicine

## 2011-07-25 ENCOUNTER — Encounter: Payer: Self-pay | Admitting: Internal Medicine

## 2011-07-25 ENCOUNTER — Ambulatory Visit (INDEPENDENT_AMBULATORY_CARE_PROVIDER_SITE_OTHER)
Admission: RE | Admit: 2011-07-25 | Discharge: 2011-07-25 | Disposition: A | Discharge status: Home / Self Care | Payer: Medicare Other | Source: Ambulatory Visit | Attending: Internal Medicine | Admitting: Internal Medicine

## 2011-07-25 DIAGNOSIS — M25569 Pain in unspecified knee: Secondary | ICD-10-CM

## 2011-07-25 DIAGNOSIS — I10 Essential (primary) hypertension: Secondary | ICD-10-CM

## 2011-07-25 DIAGNOSIS — M79605 Pain in left leg: Secondary | ICD-10-CM | POA: Insufficient documentation

## 2011-07-25 DIAGNOSIS — M25562 Pain in left knee: Secondary | ICD-10-CM

## 2011-07-25 DIAGNOSIS — E785 Hyperlipidemia, unspecified: Secondary | ICD-10-CM

## 2011-07-25 DIAGNOSIS — M1990 Unspecified osteoarthritis, unspecified site: Secondary | ICD-10-CM

## 2011-07-25 MED ORDER — MELOXICAM 15 MG PO TABS: 15.0000 mg | ORAL_TABLET | Freq: Two times a day (BID) | ORAL | Status: DC

## 2011-07-25 MED ORDER — ALPRAZOLAM 0.5 MG PO TABS: 0.5000 mg | ORAL_TABLET | Freq: Three times a day (TID) | ORAL | Status: DC | PRN

## 2011-07-25 MED ORDER — BISACODYL 5 MG PO TBEC: 5.0000 mg | DELAYED_RELEASE_TABLET | Freq: Every day | ORAL | Status: DC | PRN

## 2011-07-25 NOTE — Patient Instructions (Signed)
Try Romeo Apple Gay cream on knees

## 2011-07-25 NOTE — Progress Notes (Signed)
  Subjective:    [name], female    DOB: February 11, 1934, 77 y.o.   MRN: [id number]  HPI  C/o L knee pain - worse - chronic F/u HTN, allergies, GERD  Review of Systems  Constitutional: Negative for chills, activity change, appetite change, fatigue and unexpected weight change.  HENT: Negative for congestion, mouth sores and sinus pressure.   Eyes: Negative for visual disturbance.  Respiratory: Negative for cough and chest tightness.   Gastrointestinal: Negative for nausea and abdominal pain.  Genitourinary: Negative for frequency, difficulty urinating and vaginal pain.  Musculoskeletal: Positive for joint swelling (knee pain L). Negative for back pain and gait problem.  Skin: Negative for pallor and rash.  Neurological: Negative for dizziness, tremors, weakness, numbness and headaches.  Psychiatric/Behavioral: Negative for confusion and sleep disturbance.       Objective:   Physical Exam  Constitutional: She appears well-developed and well-nourished. No distress.  HENT:  Head: Normocephalic.  Right Ear: External ear normal.  Left Ear: External ear normal.  Nose: Nose normal.  Mouth/Throat: Oropharynx is clear and moist.  Eyes: Conjunctivae are normal. Pupils are equal, round, and reactive to light. Right eye exhibits no discharge. Left eye exhibits no discharge.  Neck: Normal range of motion. Neck supple. No JVD present. No tracheal deviation present. No thyromegaly present.  Cardiovascular: Normal rate, regular rhythm and normal heart sounds.   Pulmonary/Chest: No stridor. No respiratory distress. She has no wheezes.  Abdominal: Soft. Bowel sounds are normal. She exhibits no distension and no mass. There is no tenderness. There is no rebound and no guarding.  Musculoskeletal: She exhibits tenderness (L knee). She exhibits no edema.  Lymphadenopathy:    She has no cervical adenopathy.  Neurological: She displays normal reflexes. No cranial nerve deficit. She exhibits  normal muscle tone. Coordination normal.  Skin: No rash noted. No erythema.  Psychiatric: She has a normal mood and affect. Her behavior is normal. Judgment and thought content normal.          Assessment & Plan:

## 2011-07-25 NOTE — Assessment & Plan Note (Signed)
Start Pt

## 2011-07-25 NOTE — Assessment & Plan Note (Signed)
Vit D 

## 2011-07-27 NOTE — Assessment & Plan Note (Signed)
On Rx 

## 2011-07-28 ENCOUNTER — Telehealth: Payer: Self-pay | Admitting: *Deleted

## 2011-07-28 MED ORDER — MELOXICAM 15 MG PO TABS: 15.0000 mg | ORAL_TABLET | Freq: Every day | ORAL | Status: DC

## 2011-07-28 NOTE — Telephone Encounter (Signed)
Sorry: bid is incorrect. Has to be qd pc Thx

## 2011-07-28 NOTE — Telephone Encounter (Signed)
Pharmacy informed.

## 2011-07-28 NOTE — Telephone Encounter (Signed)
Rec fax stating new Rx for Mobic 15mg  2 daily exceeds the daily recommended dose. Pt has always taken 1 daily. Please advise which sig is correct?

## 2011-08-07 ENCOUNTER — Ambulatory Visit: Payer: Medicare Other | Admitting: Internal Medicine

## 2011-08-27 ENCOUNTER — Ambulatory Visit (INDEPENDENT_AMBULATORY_CARE_PROVIDER_SITE_OTHER): Payer: Medicare Other | Admitting: *Deleted

## 2011-08-27 DIAGNOSIS — Z23 Encounter for immunization: Secondary | ICD-10-CM

## 2011-08-27 NOTE — Progress Notes (Signed)
  Subjective:    [name], female    DOB: June 27, 1934, 77 y.o.   MRN: [id number]  HPI    Review of Systems     Objective:   Physical Exam        Assessment & Plan:

## 2011-08-29 ENCOUNTER — Encounter: Payer: Self-pay | Admitting: Internal Medicine

## 2011-08-29 ENCOUNTER — Ambulatory Visit (INDEPENDENT_AMBULATORY_CARE_PROVIDER_SITE_OTHER): Payer: Medicare Other | Admitting: Internal Medicine

## 2011-08-29 VITALS — BP 140/80 | HR 72 | Temp 97.9°F | Resp 16 | Wt 136.0 lb

## 2011-08-29 DIAGNOSIS — N952 Postmenopausal atrophic vaginitis: Secondary | ICD-10-CM | POA: Insufficient documentation

## 2011-08-29 DIAGNOSIS — E785 Hyperlipidemia, unspecified: Secondary | ICD-10-CM

## 2011-08-29 DIAGNOSIS — I10 Essential (primary) hypertension: Secondary | ICD-10-CM

## 2011-08-29 DIAGNOSIS — M1990 Unspecified osteoarthritis, unspecified site: Secondary | ICD-10-CM

## 2011-08-29 MED ORDER — ESTROGENS, CONJUGATED 0.625 MG/GM VA CREA: TOPICAL_CREAM | VAGINAL | Status: DC

## 2011-08-29 NOTE — Assessment & Plan Note (Signed)
2012 She refused pelvic exam Risks associated with recomendations noncompliance were discussed.  Potential benefits of a long term HRT  use as well as potential risks  and complications were explained to the patient and were aknowledged. Premarin cream

## 2011-08-29 NOTE — Assessment & Plan Note (Signed)
Continue with current prescription therapy as reflected on the Med list.  

## 2011-08-29 NOTE — Progress Notes (Signed)
  Subjective:    [name], female    DOB: 07/19/1934, 77 y.o.   MRN: [id number]  HPI  C/o vag dryness an burning x long time. F/u dyslipidemia. F/u OA. She had a nl PAP a couple years ago in [place]  Review of Systems  Constitutional: Negative for chills, activity change, appetite change, fatigue and unexpected weight change.  HENT: Negative for congestion, mouth sores and sinus pressure.   Eyes: Negative for visual disturbance.  Respiratory: Negative for cough and chest tightness.   Gastrointestinal: Negative for nausea and abdominal pain.  Genitourinary: Positive for vaginal pain (dryness). Negative for dysuria, urgency, frequency, vaginal bleeding and difficulty urinating.  Musculoskeletal: Negative for back pain and gait problem.  Skin: Negative for pallor and rash.  Neurological: Negative for dizziness, tremors, weakness, numbness and headaches.  Psychiatric/Behavioral: Negative for confusion and sleep disturbance.       Objective:   Physical Exam  Constitutional: She appears well-developed and well-nourished. No distress.  HENT:  Head: Normocephalic.  Right Ear: External ear normal.  Left Ear: External ear normal.  Nose: Nose normal.  Mouth/Throat: Oropharynx is clear and moist.  Eyes: Conjunctivae are normal. Pupils are equal, round, and reactive to light. Right eye exhibits no discharge. Left eye exhibits no discharge.  Neck: Normal range of motion. Neck supple. No JVD present. No tracheal deviation present. No thyromegaly present.  Cardiovascular: Normal rate, regular rhythm and normal heart sounds.   Pulmonary/Chest: No stridor. No respiratory distress. She has no wheezes.  Abdominal: Soft. Bowel sounds are normal. She exhibits no distension and no mass. There is no tenderness. There is no rebound and no guarding.  Genitourinary:       She refused a pelvic exam  Musculoskeletal: She exhibits no edema and no tenderness.  Lymphadenopathy:    She has no  cervical adenopathy.  Neurological: She displays normal reflexes. No cranial nerve deficit. She exhibits normal muscle tone. Coordination normal.  Skin: No rash noted. No erythema.  Psychiatric: She has a normal mood and affect. Her behavior is normal. Judgment and thought content normal.          Assessment & Plan:

## 2011-10-07 ENCOUNTER — Ambulatory Visit (INDEPENDENT_AMBULATORY_CARE_PROVIDER_SITE_OTHER): Payer: Medicare Other | Admitting: Internal Medicine

## 2011-10-07 ENCOUNTER — Encounter: Payer: Self-pay | Admitting: Internal Medicine

## 2011-10-07 DIAGNOSIS — N952 Postmenopausal atrophic vaginitis: Secondary | ICD-10-CM

## 2011-10-07 DIAGNOSIS — N814 Uterovaginal prolapse, unspecified: Secondary | ICD-10-CM

## 2011-10-07 DIAGNOSIS — R51 Headache: Secondary | ICD-10-CM

## 2011-10-07 DIAGNOSIS — F411 Generalized anxiety disorder: Secondary | ICD-10-CM

## 2011-10-07 MED ORDER — ATORVASTATIN CALCIUM 20 MG PO TABS: 20.0000 mg | ORAL_TABLET | Freq: Every day | ORAL | Status: DC

## 2011-10-07 NOTE — Progress Notes (Signed)
  Subjective:    Patient ID: [name], female    DOB: 07-Mar-1934, 77 y.o.   MRN: [id number]  HPI  To discuss her GYN appt w/Dr Juliene Pina and a need for hysterectomy (uterine prolapse).    C/o uterine prolapse and pelvic pains  Review of Systems  Constitutional: Negative.  Negative for fever, chills, diaphoresis, activity change, appetite change, fatigue and unexpected weight change.  HENT: Negative for hearing loss, ear pain, nosebleeds, congestion, sore throat, facial swelling, rhinorrhea, sneezing, mouth sores, trouble swallowing, neck pain, neck stiffness, postnasal drip, sinus pressure and tinnitus.   Eyes: Negative for pain, discharge, redness, itching and visual disturbance.  Respiratory: Negative for cough, chest tightness, shortness of breath, wheezing and stridor.   Cardiovascular: Negative for chest pain, palpitations and leg swelling.  Gastrointestinal: Negative for nausea, diarrhea, constipation, blood in stool, abdominal distention, anal bleeding and rectal pain.  Genitourinary: Positive for urgency and pelvic pain. Negative for dysuria, frequency, hematuria, flank pain, vaginal bleeding, vaginal discharge, difficulty urinating and genital sores.  Musculoskeletal: Positive for arthralgias. Negative for back pain, joint swelling and gait problem.  Skin: Negative.  Negative for rash.  Neurological: Negative for dizziness, tremors, seizures, syncope, speech difficulty, weakness, numbness and headaches.  Hematological: Negative for adenopathy. Does not bruise/bleed easily.  Psychiatric/Behavioral: Negative for suicidal ideas, behavioral problems, sleep disturbance, dysphoric mood and decreased concentration. The patient is nervous/anxious.        Objective:   Physical Exam  Constitutional: She appears well-developed and well-nourished. No distress.  HENT:  Head: Normocephalic.  Right Ear: External ear normal.  Left Ear: External ear normal.  Nose: Nose normal.  Mouth/Throat:  Oropharynx is clear and moist.  Eyes: Conjunctivae are normal. Pupils are equal, round, and reactive to light. Right eye exhibits no discharge. Left eye exhibits no discharge.  Neck: Normal range of motion. Neck supple. No JVD present. No tracheal deviation present. No thyromegaly present.  Cardiovascular: Normal rate, regular rhythm and normal heart sounds.   Pulmonary/Chest: No stridor. No respiratory distress. She has no wheezes.  Abdominal: Soft. Bowel sounds are normal. She exhibits no distension and no mass. There is no tenderness. There is no rebound and no guarding.  Musculoskeletal: She exhibits no edema and no tenderness.  Lymphadenopathy:    She has no cervical adenopathy.  Neurological: She displays normal reflexes. No cranial nerve deficit. She exhibits normal muscle tone. Coordination normal.  Skin: No rash noted. No erythema.  Psychiatric: Her behavior is normal. Judgment and thought content normal.       anxious          Assessment & Plan:

## 2011-10-09 ENCOUNTER — Encounter: Payer: Self-pay | Admitting: Internal Medicine

## 2011-10-09 DIAGNOSIS — N814 Uterovaginal prolapse, unspecified: Secondary | ICD-10-CM | POA: Insufficient documentation

## 2011-10-09 NOTE — Assessment & Plan Note (Signed)
Continue with current prescription therapy as reflected on the Med list.  

## 2011-10-09 NOTE — Assessment & Plan Note (Signed)
I agree w/Dr Mody's plan 100% Discussed w/pt

## 2011-10-09 NOTE — Assessment & Plan Note (Signed)
Continue with current prescription therapy as reflected on the Med list. On Topamax

## 2011-10-15 ENCOUNTER — Other Ambulatory Visit: Payer: Self-pay | Admitting: *Deleted

## 2011-10-15 MED ORDER — PROPRANOLOL HCL ER 120 MG PO CP24: 120.0000 mg | ORAL_CAPSULE | ORAL | Status: DC

## 2011-10-15 MED ORDER — CAPTOPRIL 50 MG PO TABS: 50.0000 mg | ORAL_TABLET | Freq: Two times a day (BID) | ORAL | Status: DC

## 2011-10-15 MED ORDER — OMEPRAZOLE 20 MG PO CPDR: 40.0000 mg | DELAYED_RELEASE_CAPSULE | Freq: Every day | ORAL | Status: DC

## 2011-10-15 MED ORDER — ASPIRIN-DIPYRIDAMOLE ER 25-200 MG PO CP12: 1.0000 | ORAL_CAPSULE | Freq: Two times a day (BID) | ORAL | Status: DC

## 2011-10-15 NOTE — Progress Notes (Signed)
Addended by: Merrilyn Puma on: 10/15/2011 10:09 AM   Modules accepted: Orders

## 2011-10-23 ENCOUNTER — Other Ambulatory Visit (INDEPENDENT_AMBULATORY_CARE_PROVIDER_SITE_OTHER): Payer: Medicare Other

## 2011-10-23 ENCOUNTER — Other Ambulatory Visit: Payer: Self-pay | Admitting: Internal Medicine

## 2011-10-23 DIAGNOSIS — I10 Essential (primary) hypertension: Secondary | ICD-10-CM

## 2011-10-23 DIAGNOSIS — E785 Hyperlipidemia, unspecified: Secondary | ICD-10-CM

## 2011-10-23 LAB — COMPREHENSIVE METABOLIC PANEL
ALT: 40 U/L — ABNORMAL HIGH (ref 0–35)
Albumin: 3.9 g/dL (ref 3.5–5.2)
CO2: 29 mEq/L (ref 19–32)
Calcium: 8.9 mg/dL (ref 8.4–10.5)
Chloride: 107 mEq/L (ref 96–112)
Creatinine, Ser: 0.8 mg/dL (ref 0.4–1.2)
GFR: 72.12 mL/min (ref >60.00)
Potassium: 4.6 mEq/L (ref 3.5–5.1)
Total Protein: 6.6 g/dL (ref 6.0–8.3)

## 2011-10-23 LAB — LIPID PANEL: HDL: 65.7 mg/dL (ref >39.00)

## 2011-10-28 ENCOUNTER — Ambulatory Visit (INDEPENDENT_AMBULATORY_CARE_PROVIDER_SITE_OTHER): Payer: Medicare Other | Admitting: Internal Medicine

## 2011-10-28 ENCOUNTER — Encounter: Payer: Self-pay | Admitting: Internal Medicine

## 2011-10-28 ENCOUNTER — Ambulatory Visit (INDEPENDENT_AMBULATORY_CARE_PROVIDER_SITE_OTHER)
Admission: RE | Admit: 2011-10-28 | Discharge: 2011-10-28 | Disposition: A | Discharge status: Home / Self Care | Payer: Medicare Other | Source: Ambulatory Visit | Attending: Internal Medicine | Admitting: Internal Medicine

## 2011-10-28 VITALS — BP 108/64 | HR 62 | Temp 97.3°F | Wt 139.0 lb

## 2011-10-28 DIAGNOSIS — I10 Essential (primary) hypertension: Secondary | ICD-10-CM

## 2011-10-28 DIAGNOSIS — M1990 Unspecified osteoarthritis, unspecified site: Secondary | ICD-10-CM

## 2011-10-28 DIAGNOSIS — F411 Generalized anxiety disorder: Secondary | ICD-10-CM

## 2011-10-28 DIAGNOSIS — M545 Low back pain, unspecified: Secondary | ICD-10-CM | POA: Insufficient documentation

## 2011-10-28 DIAGNOSIS — Z8679 Personal history of other diseases of the circulatory system: Secondary | ICD-10-CM

## 2011-10-28 DIAGNOSIS — Z01818 Encounter for other preprocedural examination: Secondary | ICD-10-CM

## 2011-10-28 MED ORDER — ALPRAZOLAM 0.5 MG PO TABS: 0.5000 mg | ORAL_TABLET | Freq: Three times a day (TID) | ORAL | Status: DC | PRN

## 2011-10-28 NOTE — Assessment & Plan Note (Signed)
LS spine xray Continue with current prescription therapy as reflected on the Med list.

## 2011-10-28 NOTE — Assessment & Plan Note (Addendum)
EKG Letter to Dr [id number]  clear for surgery - if needed   Thank you!

## 2011-10-31 ENCOUNTER — Encounter: Payer: Self-pay | Admitting: Internal Medicine

## 2011-11-09 NOTE — Assessment & Plan Note (Signed)
Continue with current prescription therapy as reflected on the Med list.  

## 2011-11-09 NOTE — Progress Notes (Signed)
Subjective:    [name], female    DOB: Jul 23, 1934, 77 y.o.   MRN: [id number]  HPI  IM Consult Req by Dr Juliene Pina Reason Surgical clearance for the pelvic floor repair surgery/uterine prolapse  Hx: The patient presents for a follow-up of  chronic hypertension, chronic dyslipidemia, OA, anxiety controlled with medicines, all stable  Past Medical History  Diagnosis Date  . GERD (gastroesophageal reflux disease)   . HTN (hypertension)   . Hyperlipidemia   . Osteoarthritis   . Osteopenia   . Anxiety    History reviewed. No pertinent past surgical history.  reports that she has never smoked. She does not have any smokeless tobacco history on file. She reports that she does not drink alcohol or use illicit drugs. family history includes Colon cancer in her mother; Coronary artery disease in her other; Mental illness in her father; and Stroke in her mother. Allergies  Allergen Reactions  . Alendronate Sodium     REACTION: pains  . Simvastatin     REACTION: cramp  . Voltaren (UXL:KGMWNU Acid+Diclofenac+Polyoxyethylated Castor Oil)     Skin rash from gel   Current Outpatient Prescriptions on File Prior to Visit  Medication Sig Dispense Refill  . ALPRAZolam (XANAX) 0.5 MG tablet Take 1 tablet (0.5 mg total) by mouth 3 (three) times daily as needed for sleep.  60 tablet  2  . atorvastatin (LIPITOR) 20 MG tablet Take 1 tablet (20 mg total) by mouth daily.  90 tablet  3  . bisacodyl (DULCOLAX) 5 MG EC tablet Take 1-2 tablets (5-10 mg total) by mouth daily as needed. For constipation  60 tablet  11  . butalbital-acetaminophen-caffeine (FIORICET) 50-325-40 MG per tablet Take 1-2 tablets by mouth 2 (two) times daily as needed for headache.  90 tablet  3  . captopril (CAPOTEN) 50 MG tablet Take 1 tablet (50 mg total) by mouth 2 (two) times daily.  60 tablet  5  . cetirizine (ZYRTEC) 10 MG tablet Take 10 mg by mouth daily.        . Cholecalciferol 1000 UNITS tablet Take 1,000 Units  by mouth daily.        Marland Kitchen conjugated estrogens (PREMARIN) vaginal cream Use pv qod x 1 week, then pv q1wk  42.5 g  12  . dipyridamole-aspirin (AGGRENOX) 25-200 MG per 12 hr capsule Take 1 capsule by mouth 2 (two) times daily.  60 capsule  5  . meloxicam (MOBIC) 15 MG tablet Take 1 tablet (15 mg total) by mouth daily. For arthritis  30 tablet  2  . omeprazole (PRILOSEC) 20 MG capsule Take 2 capsules (40 mg total) by mouth daily.  60 capsule  5  . propranolol (INDERAL LA) 120 MG 24 hr capsule Take 1 capsule (120 mg total) by mouth every morning.  30 capsule  5  . topiramate (TOPAMAX) 100 MG tablet Take 100 mg by mouth 3 (three) times daily.        Marland Kitchen triamcinolone (NASACORT) 55 MCG/ACT nasal inhaler 2 sprays by Nasal route daily.         BP 108/64  Pulse 62  Temp(Src) 97.3 F (36.3 C) (Oral)  Wt 139 lb (63.05 kg)  SpO2 98%   Review of Systems  Constitutional: Negative for chills, activity change, appetite change, fatigue and unexpected weight change.  HENT: Negative for congestion, mouth sores and sinus pressure.   Eyes: Negative for visual disturbance.  Respiratory: Negative for cough and chest tightness.   Gastrointestinal: Negative  for nausea and abdominal pain.  Genitourinary: Negative for frequency, difficulty urinating and vaginal pain.  Musculoskeletal: Negative for back pain and gait problem.  Skin: Negative for pallor and rash.  Neurological: Negative for dizziness, tremors, weakness, numbness and headaches.  Psychiatric/Behavioral: Negative for confusion and sleep disturbance.       Objective:   Physical Exam  Constitutional: She appears well-developed and well-nourished. No distress.  HENT:  Head: Normocephalic.  Right Ear: External ear normal.  Left Ear: External ear normal.  Nose: Nose normal.  Mouth/Throat: Oropharynx is clear and moist.  Eyes: Conjunctivae are normal. Pupils are equal, round, and reactive to light. Right eye exhibits no discharge. Left eye exhibits  no discharge.  Neck: Normal range of motion. Neck supple. No JVD present. No tracheal deviation present. No thyromegaly present.  Cardiovascular: Normal rate, regular rhythm and normal heart sounds.   Pulmonary/Chest: No stridor. No respiratory distress. She has no wheezes.  Abdominal: Soft. Bowel sounds are normal. She exhibits no distension and no mass. There is no tenderness. There is no rebound and no guarding.  Musculoskeletal: She exhibits no edema and no tenderness.  Lymphadenopathy:    She has no cervical adenopathy.  Neurological: She displays normal reflexes. No cranial nerve deficit. She exhibits normal muscle tone. Coordination normal.  Skin: No rash noted. No erythema.  Psychiatric: She has a normal mood and affect. Her behavior is normal. Judgment and thought content normal.    Lab Results  Component Value Date   WBC 4.8 09/19/2010   HGB 11.7* 09/19/2010   HCT 33.8* 09/19/2010   PLT 247.0 09/19/2010   GLUCOSE 100* 10/23/2011   CHOL 249* 10/23/2011   TRIG 75.0 10/23/2011   HDL 65.70 10/23/2011   LDLDIRECT 180.9 10/23/2011   LDLCALC 112* 03/18/2011   ALT 40* 10/23/2011   AST 26 10/23/2011   NA 141 10/23/2011   K 4.6 10/23/2011   CL 107 10/23/2011   CREATININE 0.8 10/23/2011   BUN 18 10/23/2011   CO2 29 10/23/2011   TSH 1.30 09/19/2010   HGBA1C 5.9 09/29/2007         Assessment & Plan:

## 2011-11-11 ENCOUNTER — Encounter: Payer: Self-pay | Admitting: *Deleted

## 2012-01-30 ENCOUNTER — Ambulatory Visit (INDEPENDENT_AMBULATORY_CARE_PROVIDER_SITE_OTHER): Payer: Medicare Other | Admitting: Internal Medicine

## 2012-01-30 ENCOUNTER — Encounter: Payer: Self-pay | Admitting: Internal Medicine

## 2012-01-30 VITALS — BP 102/62 | HR 76 | Temp 97.3°F | Resp 16 | Wt 142.0 lb

## 2012-01-30 DIAGNOSIS — R799 Abnormal finding of blood chemistry, unspecified: Secondary | ICD-10-CM

## 2012-01-30 DIAGNOSIS — F411 Generalized anxiety disorder: Secondary | ICD-10-CM

## 2012-01-30 DIAGNOSIS — N814 Uterovaginal prolapse, unspecified: Secondary | ICD-10-CM

## 2012-01-30 DIAGNOSIS — M545 Low back pain, unspecified: Secondary | ICD-10-CM

## 2012-01-30 DIAGNOSIS — I10 Essential (primary) hypertension: Secondary | ICD-10-CM

## 2012-01-30 DIAGNOSIS — N952 Postmenopausal atrophic vaginitis: Secondary | ICD-10-CM

## 2012-01-30 DIAGNOSIS — E785 Hyperlipidemia, unspecified: Secondary | ICD-10-CM

## 2012-01-30 MED ORDER — ALPRAZOLAM 0.5 MG PO TABS: 0.5000 mg | ORAL_TABLET | Freq: Three times a day (TID) | ORAL | Status: DC | PRN

## 2012-01-30 MED ORDER — CETIRIZINE HCL 10 MG PO TABS: 10.0000 mg | ORAL_TABLET | Freq: Every day | ORAL | Status: DC | PRN

## 2012-01-30 MED ORDER — CAPTOPRIL 50 MG PO TABS: 50.0000 mg | ORAL_TABLET | Freq: Three times a day (TID) | ORAL | Status: DC

## 2012-01-30 NOTE — Assessment & Plan Note (Signed)
The pt is still thinking

## 2012-01-30 NOTE — Assessment & Plan Note (Signed)
monitoring

## 2012-01-30 NOTE — Assessment & Plan Note (Signed)
Continue with current prescription therapy as reflected on the Med list.  

## 2012-01-30 NOTE — Assessment & Plan Note (Signed)
Better with pessary

## 2012-01-30 NOTE — Progress Notes (Signed)
[name], female   DOB: 1934/02/24, 78 y.o.   MRN: [id number]  Subjective:    [name], female    DOB: March 09, 1934, 78 y.o.   MRN: [id number]  HPI  The patient is here to follow up on chronic depression, anxiety, HTN and chronic moderate OA/LBP symptoms controlled with medicines, diet and exercise.     C/o uterine prolapse and pelvic pains  Review of Systems  Constitutional: Negative.  Negative for fever, chills, diaphoresis, activity change, appetite change, fatigue and unexpected weight change.  HENT: Negative for hearing loss, ear pain, nosebleeds, congestion, sore throat, facial swelling, rhinorrhea, sneezing, mouth sores, trouble swallowing, neck pain, neck stiffness, postnasal drip, sinus pressure and tinnitus.   Eyes: Negative for pain, discharge, redness, itching and visual disturbance.  Respiratory: Negative for cough, chest tightness, shortness of breath, wheezing and stridor.   Cardiovascular: Negative for chest pain, palpitations and leg swelling.  Gastrointestinal: Negative for nausea, diarrhea, constipation, blood in stool, abdominal distention, anal bleeding and rectal pain.  Genitourinary: Positive for urgency and pelvic pain. Negative for dysuria, frequency, hematuria, flank pain, vaginal bleeding, vaginal discharge, difficulty urinating and genital sores.  Musculoskeletal: Positive for arthralgias. Negative for back pain, joint swelling and gait problem.  Skin: Negative.  Negative for rash.  Neurological: Negative for dizziness, tremors, seizures, syncope, speech difficulty, weakness, numbness and headaches.  Hematological: Negative for adenopathy. Does not bruise/bleed easily.  Psychiatric/Behavioral: Negative for suicidal ideas, behavioral problems, sleep disturbance, dysphoric mood and decreased concentration. The patient is nervous/anxious.        Objective:   Physical Exam  Constitutional: She appears well-developed and well-nourished.  No distress.  HENT:  Head: Normocephalic.  Right Ear: External ear normal.  Left Ear: External ear normal.  Nose: Nose normal.  Mouth/Throat: Oropharynx is clear and moist.  Eyes: Conjunctivae are normal. Pupils are equal, round, and reactive to light. Right eye exhibits no discharge. Left eye exhibits no discharge.  Neck: Normal range of motion. Neck supple. No JVD present. No tracheal deviation present. No thyromegaly present.  Cardiovascular: Normal rate, regular rhythm and normal heart sounds.   Pulmonary/Chest: No stridor. No respiratory distress. She has no wheezes.  Abdominal: Soft. Bowel sounds are normal. She exhibits no distension and no mass. There is no tenderness. There is no rebound and no guarding.  Musculoskeletal: She exhibits no edema and no tenderness.  Lymphadenopathy:    She has no cervical adenopathy.  Neurological: She displays normal reflexes. No cranial nerve deficit. She exhibits normal muscle tone. Coordination normal.  Skin: No rash noted. No erythema.  Psychiatric: Her behavior is normal. Judgment and thought content normal.       anxious          Assessment & Plan:

## 2012-01-30 NOTE — Assessment & Plan Note (Signed)
Needs domestic help

## 2012-02-01 ENCOUNTER — Encounter: Payer: Self-pay | Admitting: Internal Medicine

## 2012-02-25 ENCOUNTER — Telehealth: Payer: Self-pay | Admitting: *Deleted

## 2012-02-25 NOTE — Telephone Encounter (Signed)
Faxed [place] [employer] request for independent assessment for personal care services to [phone] and mailed a copy to pt.

## 2012-03-09 ENCOUNTER — Other Ambulatory Visit: Payer: Self-pay | Admitting: Internal Medicine

## 2012-04-08 ENCOUNTER — Other Ambulatory Visit: Payer: Self-pay | Admitting: *Deleted

## 2012-04-08 MED ORDER — OMEPRAZOLE 20 MG PO CPDR: 40.0000 mg | DELAYED_RELEASE_CAPSULE | Freq: Every day | ORAL | Status: DC

## 2012-04-08 MED ORDER — PROPRANOLOL HCL ER 120 MG PO CP24: 120.0000 mg | ORAL_CAPSULE | ORAL | Status: DC

## 2012-04-08 MED ORDER — ASPIRIN-DIPYRIDAMOLE ER 25-200 MG PO CP12: 1.0000 | ORAL_CAPSULE | Freq: Two times a day (BID) | ORAL | Status: DC

## 2012-04-20 ENCOUNTER — Other Ambulatory Visit (INDEPENDENT_AMBULATORY_CARE_PROVIDER_SITE_OTHER): Payer: Medicare Other

## 2012-04-20 DIAGNOSIS — M545 Low back pain, unspecified: Secondary | ICD-10-CM

## 2012-04-20 DIAGNOSIS — F411 Generalized anxiety disorder: Secondary | ICD-10-CM

## 2012-04-20 DIAGNOSIS — N952 Postmenopausal atrophic vaginitis: Secondary | ICD-10-CM

## 2012-04-20 DIAGNOSIS — R799 Abnormal finding of blood chemistry, unspecified: Secondary | ICD-10-CM

## 2012-04-20 DIAGNOSIS — E785 Hyperlipidemia, unspecified: Secondary | ICD-10-CM

## 2012-04-20 DIAGNOSIS — N814 Uterovaginal prolapse, unspecified: Secondary | ICD-10-CM

## 2012-04-20 LAB — HEPATIC FUNCTION PANEL
Bilirubin, Direct: 0 mg/dL (ref 0.0–0.3)
Total Protein: 6.6 g/dL (ref 6.0–8.3)

## 2012-04-20 LAB — LIPID PANEL
HDL: 61.5 mg/dL (ref >39.00)
Triglycerides: 61 mg/dL (ref 0.0–149.0)
VLDL: 12.2 mg/dL (ref 0.0–40.0)

## 2012-04-20 LAB — BASIC METABOLIC PANEL
BUN: 20 mg/dL (ref 6–23)
CO2: 25 mEq/L (ref 19–32)
Chloride: 107 mEq/L (ref 96–112)
Creatinine, Ser: 0.9 mg/dL (ref 0.4–1.2)
Glucose, Bld: 96 mg/dL (ref 70–99)
Potassium: 4.7 mEq/L (ref 3.5–5.1)

## 2012-04-20 LAB — CBC WITH DIFFERENTIAL/PLATELET
Basophils Relative: 0.7 % (ref 0.0–3.0)
Eosinophils Absolute: 0.1 10*3/uL (ref 0.0–0.7)
Eosinophils Relative: 2.2 % (ref 0.0–5.0)
Hemoglobin: 11.7 g/dL — ABNORMAL LOW (ref 12.0–15.0)
Lymphocytes Relative: 42.4 % (ref 12.0–46.0)
MCHC: 33.4 g/dL (ref 30.0–36.0)
Monocytes Relative: 8.5 % (ref 3.0–12.0)
Neutro Abs: 2 10*3/uL (ref 1.4–7.7)
RBC: 3.62 Mil/uL — ABNORMAL LOW (ref 3.87–5.11)
WBC: 4.2 10*3/uL — ABNORMAL LOW (ref 4.5–10.5)

## 2012-04-30 ENCOUNTER — Encounter: Payer: Self-pay | Admitting: Internal Medicine

## 2012-04-30 ENCOUNTER — Ambulatory Visit (INDEPENDENT_AMBULATORY_CARE_PROVIDER_SITE_OTHER): Payer: Medicare Other | Admitting: Internal Medicine

## 2012-04-30 VITALS — BP 95/58 | HR 64 | Temp 98.2°F | Resp 16 | Wt 142.0 lb

## 2012-04-30 DIAGNOSIS — E785 Hyperlipidemia, unspecified: Secondary | ICD-10-CM

## 2012-04-30 DIAGNOSIS — M1990 Unspecified osteoarthritis, unspecified site: Secondary | ICD-10-CM

## 2012-04-30 DIAGNOSIS — F411 Generalized anxiety disorder: Secondary | ICD-10-CM

## 2012-04-30 DIAGNOSIS — M545 Low back pain: Secondary | ICD-10-CM

## 2012-04-30 MED ORDER — ALPRAZOLAM 0.5 MG PO TABS: 0.5000 mg | ORAL_TABLET | Freq: Three times a day (TID) | ORAL | Status: DC | PRN

## 2012-04-30 MED ORDER — CAPTOPRIL 50 MG PO TABS: 50.0000 mg | ORAL_TABLET | Freq: Two times a day (BID) | ORAL | Status: DC

## 2012-04-30 NOTE — Assessment & Plan Note (Signed)
Chronic OA/LBP D/c lipitor

## 2012-04-30 NOTE — Assessment & Plan Note (Signed)
Continue with current prescription therapy as reflected on the Med list.  

## 2012-04-30 NOTE — Assessment & Plan Note (Signed)
Chronic  Statin intolerant 

## 2012-04-30 NOTE — Progress Notes (Signed)
Subjective:    [name], female    DOB: 1934/08/19, 78 y.o.   MRN: [id number]  HPI  The patient is here to follow up on chronic depression, anxiety, HTN and chronic moderate OA/LBP symptoms controlled with medicines, diet and exercise. Lipitor caused arthralgias  Wt Readings from Last 3 Encounters:  04/30/12 142 lb (64.411 kg)  01/30/12 142 lb (64.411 kg)  10/28/11 139 lb (63.05 kg)   BP Readings from Last 3 Encounters:  04/30/12 88/58  01/30/12 102/62  10/28/11 108/64       C/o uterine prolapse and pelvic pains  Review of Systems  Constitutional: Negative.  Negative for fever, chills, diaphoresis, activity change, appetite change, fatigue and unexpected weight change.  HENT: Negative for hearing loss, ear pain, nosebleeds, congestion, sore throat, facial swelling, rhinorrhea, sneezing, mouth sores, trouble swallowing, neck pain, neck stiffness, postnasal drip, sinus pressure and tinnitus.   Eyes: Negative for pain, discharge, redness, itching and visual disturbance.  Respiratory: Negative for cough, chest tightness, shortness of breath, wheezing and stridor.   Cardiovascular: Negative for chest pain, palpitations and leg swelling.  Gastrointestinal: Negative for nausea, diarrhea, constipation, blood in stool, abdominal distention, anal bleeding and rectal pain.  Genitourinary: Positive for urgency and pelvic pain. Negative for dysuria, frequency, hematuria, flank pain, vaginal bleeding, vaginal discharge, difficulty urinating and genital sores.  Musculoskeletal: Positive for arthralgias. Negative for back pain, joint swelling and gait problem.  Skin: Negative.  Negative for rash.  Neurological: Negative for dizziness, tremors, seizures, syncope, speech difficulty, weakness, numbness and headaches.  Hematological: Negative for adenopathy. Does not bruise/bleed easily.  Psychiatric/Behavioral: Negative for suicidal ideas, behavioral problems, sleep disturbance,  dysphoric mood and decreased concentration. The patient is nervous/anxious.        Objective:   Physical Exam  Constitutional: She appears well-developed and well-nourished. No distress.  HENT:  Head: Normocephalic.  Right Ear: External ear normal.  Left Ear: External ear normal.  Nose: Nose normal.  Mouth/Throat: Oropharynx is clear and moist.  Eyes: Conjunctivae are normal. Pupils are equal, round, and reactive to light. Right eye exhibits no discharge. Left eye exhibits no discharge.  Neck: Normal range of motion. Neck supple. No JVD present. No tracheal deviation present. No thyromegaly present.  Cardiovascular: Normal rate, regular rhythm and normal heart sounds.   Pulmonary/Chest: No stridor. No respiratory distress. She has no wheezes.  Abdominal: Soft. Bowel sounds are normal. She exhibits no distension and no mass. There is no tenderness. There is no rebound and no guarding.  Musculoskeletal: She exhibits no edema and no tenderness.  Lymphadenopathy:    She has no cervical adenopathy.  Neurological: She displays normal reflexes. No cranial nerve deficit. She exhibits normal muscle tone. Coordination normal.  Skin: No rash noted. No erythema.  Psychiatric: Her behavior is normal. Judgment and thought content normal.       anxious   Lab Results  Component Value Date   WBC 4.2* 04/20/2012   HGB 11.7* 04/20/2012   HCT 35.1* 04/20/2012   PLT 205.0 04/20/2012   GLUCOSE 96 04/20/2012   CHOL 205* 04/20/2012   TRIG 61.0 04/20/2012   HDL 61.50 04/20/2012   LDLDIRECT 138.7 04/20/2012   LDLCALC 112* 03/18/2011   ALT 16 04/20/2012   AST 16 04/20/2012   NA 139 04/20/2012   K 4.7 04/20/2012   CL 107 04/20/2012   CREATININE 0.9 04/20/2012   BUN 20 04/20/2012   CO2 25 04/20/2012   TSH 1.90 04/20/2012   HGBA1C  5.9 09/29/2007         Assessment & Plan:

## 2012-08-03 ENCOUNTER — Other Ambulatory Visit (INDEPENDENT_AMBULATORY_CARE_PROVIDER_SITE_OTHER): Payer: Medicare Other

## 2012-08-03 DIAGNOSIS — E785 Hyperlipidemia, unspecified: Secondary | ICD-10-CM

## 2012-08-03 DIAGNOSIS — M545 Low back pain: Secondary | ICD-10-CM

## 2012-08-03 DIAGNOSIS — F411 Generalized anxiety disorder: Secondary | ICD-10-CM

## 2012-08-03 DIAGNOSIS — M1990 Unspecified osteoarthritis, unspecified site: Secondary | ICD-10-CM

## 2012-08-03 LAB — HEPATIC FUNCTION PANEL
ALT: 45 U/L — ABNORMAL HIGH (ref 0–35)
Alkaline Phosphatase: 81 U/L (ref 39–117)
Bilirubin, Direct: 0 mg/dL (ref 0.0–0.3)
Total Bilirubin: 0.5 mg/dL (ref 0.3–1.2)

## 2012-08-03 LAB — BASIC METABOLIC PANEL
BUN: 19 mg/dL (ref 6–23)
Creatinine, Ser: 0.9 mg/dL (ref 0.4–1.2)
GFR: 64.63 mL/min (ref >60.00)
Glucose, Bld: 92 mg/dL (ref 70–99)
Potassium: 4.5 mEq/L (ref 3.5–5.1)

## 2012-08-03 LAB — LIPID PANEL: HDL: 57 mg/dL (ref >39.00)

## 2012-08-09 ENCOUNTER — Ambulatory Visit (INDEPENDENT_AMBULATORY_CARE_PROVIDER_SITE_OTHER): Payer: 59 | Admitting: Internal Medicine

## 2012-08-09 ENCOUNTER — Encounter: Payer: Self-pay | Admitting: Internal Medicine

## 2012-08-09 VITALS — BP 118/62 | HR 60 | Temp 97.4°F | Wt 144.0 lb

## 2012-08-09 DIAGNOSIS — L570 Actinic keratosis: Secondary | ICD-10-CM

## 2012-08-09 DIAGNOSIS — F411 Generalized anxiety disorder: Secondary | ICD-10-CM

## 2012-08-09 DIAGNOSIS — M545 Low back pain: Secondary | ICD-10-CM

## 2012-08-09 DIAGNOSIS — R799 Abnormal finding of blood chemistry, unspecified: Secondary | ICD-10-CM

## 2012-08-09 DIAGNOSIS — H61199 Noninfective disorders of pinna, unspecified ear: Secondary | ICD-10-CM

## 2012-08-09 DIAGNOSIS — E785 Hyperlipidemia, unspecified: Secondary | ICD-10-CM

## 2012-08-09 DIAGNOSIS — H938X9 Other specified disorders of ear, unspecified ear: Secondary | ICD-10-CM

## 2012-08-09 DIAGNOSIS — I10 Essential (primary) hypertension: Secondary | ICD-10-CM

## 2012-08-09 DIAGNOSIS — R7989 Other specified abnormal findings of blood chemistry: Secondary | ICD-10-CM

## 2012-08-09 MED ORDER — PROPRANOLOL HCL ER 120 MG PO CP24: 120.0000 mg | ORAL_CAPSULE | ORAL | Status: DC

## 2012-08-09 MED ORDER — OMEPRAZOLE 20 MG PO CPDR: 40.0000 mg | DELAYED_RELEASE_CAPSULE | Freq: Every day | ORAL | Status: DC

## 2012-08-09 MED ORDER — ALPRAZOLAM 0.5 MG PO TABS: 0.5000 mg | ORAL_TABLET | Freq: Three times a day (TID) | ORAL | Status: DC | PRN

## 2012-08-09 MED ORDER — ASPIRIN-DIPYRIDAMOLE ER 25-200 MG PO CP12: 1.0000 | ORAL_CAPSULE | Freq: Two times a day (BID) | ORAL | Status: DC

## 2012-08-09 NOTE — Assessment & Plan Note (Signed)
R ear 8/13 - r/o [facility] ENT cons Dr Pollyann Kennedy

## 2012-08-09 NOTE — Assessment & Plan Note (Signed)
Continue with current prescription therapy as reflected on the Med list.  

## 2012-08-09 NOTE — Assessment & Plan Note (Signed)
She declined cryo today Will do in 3 mo

## 2012-08-09 NOTE — Progress Notes (Signed)
[name], female   DOB: 08-24-1934, 78 y.o.   MRN: [id number]  Subjective:    [name], female    DOB: 05-21-1934, 78 y.o.   MRN: [id number]  HPI  The patient is here to follow up on chronic depression, anxiety, HTN and chronic moderate OA/LBP symptoms controlled with medicines, diet and exercise. Lipitor caused arthralgias - not taking it now  Wt Readings from Last 3 Encounters:  08/09/12 144 lb (65.318 kg)  04/30/12 142 lb (64.411 kg)  01/30/12 142 lb (64.411 kg)   BP Readings from Last 3 Encounters:  08/09/12 118/62  04/30/12 95/58  01/30/12 102/62       C/o uterine prolapse and pelvic pains  Review of Systems  Constitutional: Negative.  Negative for fever, chills, diaphoresis, activity change, appetite change, fatigue and unexpected weight change.  HENT: Negative for hearing loss, ear pain, nosebleeds, congestion, sore throat, facial swelling, rhinorrhea, sneezing, mouth sores, trouble swallowing, neck pain, neck stiffness, postnasal drip, sinus pressure and tinnitus.   Eyes: Negative for pain, discharge, redness, itching and visual disturbance.  Respiratory: Negative for cough, chest tightness, shortness of breath, wheezing and stridor.   Cardiovascular: Negative for chest pain, palpitations and leg swelling.  Gastrointestinal: Negative for nausea, diarrhea, constipation, blood in stool, abdominal distention, anal bleeding and rectal pain.  Genitourinary: Positive for urgency and pelvic pain. Negative for dysuria, frequency, hematuria, flank pain, vaginal bleeding, vaginal discharge, difficulty urinating and genital sores.  Musculoskeletal: Positive for arthralgias. Negative for back pain, joint swelling and gait problem.  Skin: Negative.  Negative for rash.  Neurological: Negative for dizziness, tremors, seizures, syncope, speech difficulty, weakness, numbness and headaches.  Hematological: Negative for adenopathy. Does not bruise/bleed easily.    Psychiatric/Behavioral: Negative for suicidal ideas, behavioral problems, disturbed wake/sleep cycle, dysphoric mood and decreased concentration. The patient is nervous/anxious.        Objective:   Physical Exam  Constitutional: She appears well-developed and well-nourished. No distress.  HENT:  Head: Normocephalic.  Right Ear: External ear normal.  Left Ear: External ear normal.  Nose: Nose normal.  Mouth/Throat: Oropharynx is clear and moist.  Eyes: Conjunctivae are normal. Pupils are equal, round, and reactive to light. Right eye exhibits no discharge. Left eye exhibits no discharge.  Neck: Normal range of motion. Neck supple. No JVD present. No tracheal deviation present. No thyromegaly present.  Cardiovascular: Normal rate, regular rhythm and normal heart sounds.   Pulmonary/Chest: No stridor. No respiratory distress. She has no wheezes.  Abdominal: Soft. Bowel sounds are normal. She exhibits no distension and no mass. There is no tenderness. There is no rebound and no guarding.  Musculoskeletal: She exhibits no edema and no tenderness.  Lymphadenopathy:    She has no cervical adenopathy.  Neurological: She displays normal reflexes. No cranial nerve deficit. She exhibits normal muscle tone. Coordination normal.  Skin: No rash noted. No erythema.  Psychiatric: Her behavior is normal. Judgment and thought content normal.       anxious   Lab Results  Component Value Date   WBC 4.2* 04/20/2012   HGB 11.7* 04/20/2012   HCT 35.1* 04/20/2012   PLT 205.0 04/20/2012   GLUCOSE 92 08/03/2012   CHOL 228* 08/03/2012   TRIG 103.0 08/03/2012   HDL 57.00 08/03/2012   LDLDIRECT 148.8 08/03/2012   LDLCALC 112* 03/18/2011   ALT 45* 08/03/2012   AST 41* 08/03/2012   NA 139 08/03/2012   K 4.5 08/03/2012   CL 103 08/03/2012  CREATININE 0.9 08/03/2012   BUN 19 08/03/2012   CO2 31 08/03/2012   TSH 1.90 04/20/2012   HGBA1C 5.9 09/29/2007         Assessment & Plan:

## 2012-08-09 NOTE — Assessment & Plan Note (Signed)
Continue with current diet  

## 2012-08-09 NOTE — Assessment & Plan Note (Signed)
CBC, iron 

## 2012-08-30 ENCOUNTER — Telehealth: Payer: Self-pay | Admitting: Internal Medicine

## 2012-08-30 NOTE — Telephone Encounter (Signed)
Dr. Pollyann Kennedy told her she had skin cancer.  She wants a referral to dermatology.  Does she need to be seen here first.

## 2012-08-30 NOTE — Telephone Encounter (Signed)
No need. Dr Pollyann Kennedy will remove it. Thx

## 2012-08-31 NOTE — Telephone Encounter (Signed)
Pt informed

## 2012-10-13 ENCOUNTER — Ambulatory Visit: Payer: 59

## 2012-11-02 ENCOUNTER — Other Ambulatory Visit (INDEPENDENT_AMBULATORY_CARE_PROVIDER_SITE_OTHER): Payer: 59

## 2012-11-02 DIAGNOSIS — E785 Hyperlipidemia, unspecified: Secondary | ICD-10-CM

## 2012-11-02 DIAGNOSIS — H938X9 Other specified disorders of ear, unspecified ear: Secondary | ICD-10-CM

## 2012-11-02 DIAGNOSIS — F411 Generalized anxiety disorder: Secondary | ICD-10-CM

## 2012-11-02 DIAGNOSIS — I10 Essential (primary) hypertension: Secondary | ICD-10-CM

## 2012-11-02 DIAGNOSIS — M545 Low back pain: Secondary | ICD-10-CM

## 2012-11-02 DIAGNOSIS — R7989 Other specified abnormal findings of blood chemistry: Secondary | ICD-10-CM

## 2012-11-02 DIAGNOSIS — L570 Actinic keratosis: Secondary | ICD-10-CM

## 2012-11-02 DIAGNOSIS — R799 Abnormal finding of blood chemistry, unspecified: Secondary | ICD-10-CM

## 2012-11-02 DIAGNOSIS — H61199 Noninfective disorders of pinna, unspecified ear: Secondary | ICD-10-CM

## 2012-11-02 LAB — URINALYSIS, ROUTINE W REFLEX MICROSCOPIC
Bilirubin Urine: NEGATIVE
Nitrite: NEGATIVE
pH: 7 (ref 5.0–8.0)

## 2012-11-02 LAB — CBC WITH DIFFERENTIAL/PLATELET
Basophils Absolute: 0 10*3/uL (ref 0.0–0.1)
HCT: 35.3 % — ABNORMAL LOW (ref 36.0–46.0)
Lymphs Abs: 2 10*3/uL (ref 0.7–4.0)
Monocytes Relative: 11.2 % (ref 3.0–12.0)
Neutrophils Relative %: 41.3 % — ABNORMAL LOW (ref 43.0–77.0)
Platelets: 247 10*3/uL (ref 150.0–400.0)
RDW: 13.3 % (ref 11.5–14.6)

## 2012-11-02 LAB — HEPATIC FUNCTION PANEL
Albumin: 3.4 g/dL — ABNORMAL LOW (ref 3.5–5.2)
Alkaline Phosphatase: 130 U/L — ABNORMAL HIGH (ref 39–117)
Total Protein: 6.7 g/dL (ref 6.0–8.3)

## 2012-11-02 LAB — IBC PANEL
Iron: 75 ug/dL (ref 42–145)
Saturation Ratios: 25.9 % (ref 20.0–50.0)
Transferrin: 207.1 mg/dL — ABNORMAL LOW (ref 212.0–360.0)

## 2012-11-03 ENCOUNTER — Encounter: Payer: Self-pay | Admitting: Internal Medicine

## 2012-11-09 ENCOUNTER — Encounter: Payer: Self-pay | Admitting: Internal Medicine

## 2012-11-09 ENCOUNTER — Ambulatory Visit (INDEPENDENT_AMBULATORY_CARE_PROVIDER_SITE_OTHER): Payer: 59 | Admitting: Internal Medicine

## 2012-11-09 VITALS — BP 140/80 | HR 80 | Temp 97.6°F | Resp 16 | Wt 145.0 lb

## 2012-11-09 DIAGNOSIS — H61199 Noninfective disorders of pinna, unspecified ear: Secondary | ICD-10-CM

## 2012-11-09 DIAGNOSIS — F411 Generalized anxiety disorder: Secondary | ICD-10-CM

## 2012-11-09 DIAGNOSIS — I10 Essential (primary) hypertension: Secondary | ICD-10-CM

## 2012-11-09 DIAGNOSIS — Z23 Encounter for immunization: Secondary | ICD-10-CM

## 2012-11-09 DIAGNOSIS — R0989 Other specified symptoms and signs involving the circulatory and respiratory systems: Secondary | ICD-10-CM

## 2012-11-09 DIAGNOSIS — E785 Hyperlipidemia, unspecified: Secondary | ICD-10-CM

## 2012-11-09 DIAGNOSIS — F419 Anxiety disorder, unspecified: Secondary | ICD-10-CM

## 2012-11-09 DIAGNOSIS — R0683 Snoring: Secondary | ICD-10-CM

## 2012-11-09 DIAGNOSIS — H938X9 Other specified disorders of ear, unspecified ear: Secondary | ICD-10-CM

## 2012-11-09 DIAGNOSIS — C4490 Unspecified malignant neoplasm of skin, unspecified: Secondary | ICD-10-CM | POA: Insufficient documentation

## 2012-11-09 DIAGNOSIS — K219 Gastro-esophageal reflux disease without esophagitis: Secondary | ICD-10-CM

## 2012-11-09 DIAGNOSIS — R51 Headache: Secondary | ICD-10-CM

## 2012-11-09 MED ORDER — CAPTOPRIL 100 MG PO TABS: 100.0000 mg | ORAL_TABLET | Freq: Three times a day (TID) | ORAL | Status: DC

## 2012-11-09 MED ORDER — ALPRAZOLAM 0.5 MG PO TABS: 0.5000 mg | ORAL_TABLET | Freq: Three times a day (TID) | ORAL | Status: DC | PRN

## 2012-11-09 MED ORDER — OMEPRAZOLE 20 MG PO CPDR: 40.0000 mg | DELAYED_RELEASE_CAPSULE | Freq: Every day | ORAL | Status: DC

## 2012-11-09 NOTE — Assessment & Plan Note (Signed)
11/13 declined sleep test

## 2012-11-09 NOTE — Assessment & Plan Note (Signed)
Continue with current prescription therapy as reflected on the Med list.  

## 2012-11-09 NOTE — Assessment & Plan Note (Signed)
R ear  Dr Pollyann Kennedy, Dr Jorja Loa

## 2012-11-09 NOTE — Assessment & Plan Note (Addendum)
  On diet  

## 2012-11-09 NOTE — Progress Notes (Signed)
Subjective:    [name], female    DOB: 1934-05-01, 78 y.o.   MRN: [id number]  HPI  The patient is here to follow up on chronic depression, anxiety, HTN and chronic moderate OA/LBP symptoms controlled with medicines, diet and exercise. Lipitor caused arthralgias - not taking it now at all  Wt Readings from Last 3 Encounters:  11/09/12 145 lb (65.772 kg)  08/09/12 144 lb (65.318 kg)  04/30/12 142 lb (64.411 kg)   BP Readings from Last 3 Encounters:  11/09/12 140/80  08/09/12 118/62  04/30/12 95/58   C/o uterine prolapse and pelvic pains  Review of Systems  Constitutional: Negative.  Negative for fever, chills, diaphoresis, activity change, appetite change, fatigue and unexpected weight change.  HENT: Negative for hearing loss, ear pain, nosebleeds, congestion, sore throat, facial swelling, rhinorrhea, sneezing, mouth sores, trouble swallowing, neck pain, neck stiffness, postnasal drip, sinus pressure and tinnitus.   Eyes: Negative for pain, discharge, redness, itching and visual disturbance.  Respiratory: Negative for cough, chest tightness, shortness of breath, wheezing and stridor.   Cardiovascular: Negative for chest pain, palpitations and leg swelling.  Gastrointestinal: Negative for nausea, diarrhea, constipation, blood in stool, abdominal distention, anal bleeding and rectal pain.  Genitourinary: Positive for urgency and pelvic pain. Negative for dysuria, frequency, hematuria, flank pain, vaginal bleeding, vaginal discharge, difficulty urinating and genital sores.  Musculoskeletal: Positive for arthralgias. Negative for back pain, joint swelling and gait problem.  Skin: Negative.  Negative for rash.  Neurological: Negative for dizziness, tremors, seizures, syncope, speech difficulty, weakness, numbness and headaches.  Hematological: Negative for adenopathy. Does not bruise/bleed easily.  Psychiatric/Behavioral: Negative for suicidal ideas, behavioral problems,  sleep disturbance, dysphoric mood and decreased concentration. The patient is nervous/anxious.        Objective:   Physical Exam  Constitutional: She appears well-developed and well-nourished. No distress.  HENT:  Head: Normocephalic.  Right Ear: External ear normal.  Left Ear: External ear normal.  Nose: Nose normal.  Mouth/Throat: Oropharynx is clear and moist.  Eyes: Conjunctivae normal are normal. Pupils are equal, round, and reactive to light. Right eye exhibits no discharge. Left eye exhibits no discharge.  Neck: Normal range of motion. Neck supple. No JVD present. No tracheal deviation present. No thyromegaly present.  Cardiovascular: Normal rate, regular rhythm and normal heart sounds.   Pulmonary/Chest: No stridor. No respiratory distress. She has no wheezes.  Abdominal: Soft. Bowel sounds are normal. She exhibits no distension and no mass. There is no tenderness. There is no rebound and no guarding.  Musculoskeletal: She exhibits no edema and no tenderness.  Lymphadenopathy:    She has no cervical adenopathy.  Neurological: She displays normal reflexes. No cranial nerve deficit. She exhibits normal muscle tone. Coordination normal.  Skin: No rash noted. No erythema.  Psychiatric: Her behavior is normal. Judgment and thought content normal.       anxious   Lab Results  Component Value Date   WBC 4.5 11/02/2012   HGB 11.5* 11/02/2012   HCT 35.3* 11/02/2012   PLT 247.0 11/02/2012   GLUCOSE 92 08/03/2012   CHOL 228* 08/03/2012   TRIG 103.0 08/03/2012   HDL 57.00 08/03/2012   LDLDIRECT 148.8 08/03/2012   LDLCALC 112* 03/18/2011   ALT 48* 11/02/2012   AST 32 11/02/2012   NA 139 08/03/2012   K 4.5 08/03/2012   CL 103 08/03/2012   CREATININE 0.9 08/03/2012   BUN 19 08/03/2012   CO2 31 08/03/2012   TSH 1.90  04/20/2012   HGBA1C 5.9 09/29/2007         Assessment & Plan:

## 2012-11-09 NOTE — Assessment & Plan Note (Signed)
Chronic Risks associated with treatment noncompliance were discussed. Compliance was encouraged. Will increase Capoten to 100 mg bid

## 2012-11-10 NOTE — Assessment & Plan Note (Signed)
She is seeing Dr Jorja Loa now

## 2013-01-10 ENCOUNTER — Other Ambulatory Visit: Payer: Self-pay | Admitting: *Deleted

## 2013-01-10 MED ORDER — CAPTOPRIL 100 MG PO TABS: 100.0000 mg | ORAL_TABLET | Freq: Three times a day (TID) | ORAL | Status: DC

## 2013-02-03 ENCOUNTER — Other Ambulatory Visit (INDEPENDENT_AMBULATORY_CARE_PROVIDER_SITE_OTHER): Payer: 59

## 2013-02-03 DIAGNOSIS — R0609 Other forms of dyspnea: Secondary | ICD-10-CM

## 2013-02-03 DIAGNOSIS — F419 Anxiety disorder, unspecified: Secondary | ICD-10-CM

## 2013-02-03 DIAGNOSIS — R0683 Snoring: Secondary | ICD-10-CM

## 2013-02-03 DIAGNOSIS — E785 Hyperlipidemia, unspecified: Secondary | ICD-10-CM

## 2013-02-03 DIAGNOSIS — Z23 Encounter for immunization: Secondary | ICD-10-CM

## 2013-02-03 DIAGNOSIS — C4490 Unspecified malignant neoplasm of skin, unspecified: Secondary | ICD-10-CM

## 2013-02-03 DIAGNOSIS — R51 Headache: Secondary | ICD-10-CM

## 2013-02-03 DIAGNOSIS — I10 Essential (primary) hypertension: Secondary | ICD-10-CM

## 2013-02-03 DIAGNOSIS — F411 Generalized anxiety disorder: Secondary | ICD-10-CM

## 2013-02-03 DIAGNOSIS — K219 Gastro-esophageal reflux disease without esophagitis: Secondary | ICD-10-CM

## 2013-02-03 LAB — HEPATIC FUNCTION PANEL
ALT: 25 U/L (ref 0–35)
Albumin: 3.4 g/dL — ABNORMAL LOW (ref 3.5–5.2)
Alkaline Phosphatase: 93 U/L (ref 39–117)
Bilirubin, Direct: 0.1 mg/dL (ref 0.0–0.3)
Total Protein: 6.3 g/dL (ref 6.0–8.3)

## 2013-02-03 LAB — LIPID PANEL
Cholesterol: 247 mg/dL — ABNORMAL HIGH (ref 0–200)
Total CHOL/HDL Ratio: 5
Triglycerides: 112 mg/dL (ref 0.0–149.0)

## 2013-02-03 LAB — BASIC METABOLIC PANEL
CO2: 31 mEq/L (ref 19–32)
Calcium: 8.8 mg/dL (ref 8.4–10.5)
Chloride: 104 mEq/L (ref 96–112)
Creatinine, Ser: 0.8 mg/dL (ref 0.4–1.2)
Glucose, Bld: 92 mg/dL (ref 70–99)
Sodium: 138 mEq/L (ref 135–145)

## 2013-02-10 ENCOUNTER — Ambulatory Visit (INDEPENDENT_AMBULATORY_CARE_PROVIDER_SITE_OTHER): Payer: 59 | Admitting: Internal Medicine

## 2013-02-10 ENCOUNTER — Encounter: Payer: Self-pay | Admitting: Internal Medicine

## 2013-02-10 VITALS — BP 130/80 | HR 72 | Temp 97.5°F | Resp 16 | Wt 149.0 lb

## 2013-02-10 DIAGNOSIS — C4490 Unspecified malignant neoplasm of skin, unspecified: Secondary | ICD-10-CM

## 2013-02-10 DIAGNOSIS — I10 Essential (primary) hypertension: Secondary | ICD-10-CM

## 2013-02-10 DIAGNOSIS — E785 Hyperlipidemia, unspecified: Secondary | ICD-10-CM

## 2013-02-10 MED ORDER — ALPRAZOLAM 0.5 MG PO TABS: 0.5000 mg | ORAL_TABLET | Freq: Three times a day (TID) | ORAL | Status: DC | PRN

## 2013-02-10 MED ORDER — ROSUVASTATIN CALCIUM 10 MG PO TABS: 10.0000 mg | ORAL_TABLET | ORAL | Status: DC

## 2013-02-10 NOTE — Assessment & Plan Note (Signed)
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

## 2013-02-10 NOTE — Assessment & Plan Note (Signed)
She can try low dose Crestor weekly

## 2013-02-10 NOTE — Assessment & Plan Note (Signed)
Continue with current prescription therapy as reflected on the Med list.  

## 2013-02-10 NOTE — Progress Notes (Signed)
  Subjective:    HPI  The patient is here to follow up on chronic depression, anxiety, HTN and chronic moderate OA/LBP symptoms controlled with medicines, diet and exercise. Lipitor caused arthralgias - not taking it now at all  Wt Readings from Last 3 Encounters:  02/10/13 149 lb (67.586 kg)  11/09/12 145 lb (65.772 kg)  08/09/12 144 lb (65.318 kg)   BP Readings from Last 3 Encounters:  02/10/13 130/80  11/09/12 140/80  08/09/12 118/62   C/o uterine prolapse and pelvic pains  Review of Systems  Constitutional: Negative.  Negative for fever, chills, diaphoresis, activity change, appetite change, fatigue and unexpected weight change.  HENT: Negative for hearing loss, ear pain, nosebleeds, congestion, sore throat, facial swelling, rhinorrhea, sneezing, mouth sores, trouble swallowing, neck pain, neck stiffness, postnasal drip, sinus pressure and tinnitus.   Eyes: Negative for pain, discharge, redness, itching and visual disturbance.  Respiratory: Negative for cough, chest tightness, shortness of breath, wheezing and stridor.   Cardiovascular: Negative for chest pain, palpitations and leg swelling.  Gastrointestinal: Negative for nausea, diarrhea, constipation, blood in stool, abdominal distention, anal bleeding and rectal pain.  Genitourinary: Positive for urgency and pelvic pain. Negative for dysuria, frequency, hematuria, flank pain, vaginal bleeding, vaginal discharge, difficulty urinating and genital sores.  Musculoskeletal: Positive for arthralgias. Negative for back pain, joint swelling and gait problem.  Skin: Negative.  Negative for rash.  Neurological: Negative for dizziness, tremors, seizures, syncope, speech difficulty, weakness, numbness and headaches.  Hematological: Negative for adenopathy. Does not bruise/bleed easily.  Psychiatric/Behavioral: Negative for suicidal ideas, behavioral problems, sleep disturbance, dysphoric mood and decreased concentration. The patient is  nervous/anxious.        Objective:   Physical Exam  Constitutional: She appears well-developed and well-nourished. No distress.  HENT:  Head: Normocephalic.  Right Ear: External ear normal.  Left Ear: External ear normal.  Nose: Nose normal.  Mouth/Throat: Oropharynx is clear and moist.  Eyes: Conjunctivae are normal. Pupils are equal, round, and reactive to light. Right eye exhibits no discharge. Left eye exhibits no discharge.  Neck: Normal range of motion. Neck supple. No JVD present. No tracheal deviation present. No thyromegaly present.  Cardiovascular: Normal rate, regular rhythm and normal heart sounds.   Pulmonary/Chest: No stridor. No respiratory distress. She has no wheezes.  Abdominal: Soft. Bowel sounds are normal. She exhibits no distension and no mass. There is no tenderness. There is no rebound and no guarding.  Musculoskeletal: She exhibits no edema and no tenderness.  Lymphadenopathy:    She has no cervical adenopathy.  Neurological: She displays normal reflexes. No cranial nerve deficit. She exhibits normal muscle tone. Coordination normal.  Skin: No rash noted. No erythema.  Psychiatric: Her behavior is normal. Judgment and thought content normal.  anxious   Lab Results  Component Value Date   WBC 4.5 11/02/2012   HGB 11.5* 11/02/2012   HCT 35.3* 11/02/2012   PLT 247.0 11/02/2012   GLUCOSE 92 02/03/2013   CHOL 247* 02/03/2013   TRIG 112.0 02/03/2013   HDL 48.50 02/03/2013   LDLDIRECT 156.6 02/03/2013   LDLCALC 112* 03/18/2011   ALT 25 02/03/2013   AST 20 02/03/2013   NA 138 02/03/2013   K 4.3 02/03/2013   CL 104 02/03/2013   CREATININE 0.8 02/03/2013   BUN 16 02/03/2013   CO2 31 02/03/2013   TSH 1.90 04/20/2012   HGBA1C 5.9 09/29/2007         Assessment & Plan:

## 2013-02-10 NOTE — Assessment & Plan Note (Signed)
S/p XRT 

## 2013-03-11 ENCOUNTER — Telehealth: Payer: Self-pay | Admitting: *Deleted

## 2013-03-11 NOTE — Telephone Encounter (Signed)
Rf req for Alprazolam 0.5 mg 1 po tid prn. Last filled 01/07/13. Ok to Rf?

## 2013-03-14 NOTE — Telephone Encounter (Signed)
OK to fill this prescription with additional refills x2 Thank you!  

## 2013-03-15 MED ORDER — ALPRAZOLAM 0.5 MG PO TABS: 0.5000 mg | ORAL_TABLET | Freq: Three times a day (TID) | ORAL | Status: DC | PRN

## 2013-03-15 NOTE — Telephone Encounter (Signed)
Done

## 2013-05-25 ENCOUNTER — Other Ambulatory Visit (INDEPENDENT_AMBULATORY_CARE_PROVIDER_SITE_OTHER): Payer: 59

## 2013-05-25 DIAGNOSIS — E785 Hyperlipidemia, unspecified: Secondary | ICD-10-CM

## 2013-05-25 LAB — BASIC METABOLIC PANEL
CO2: 24 mEq/L (ref 19–32)
Chloride: 107 mEq/L (ref 96–112)
Glucose, Bld: 94 mg/dL (ref 70–99)
Sodium: 139 mEq/L (ref 135–145)

## 2013-05-25 LAB — LIPID PANEL
Cholesterol: 237 mg/dL — ABNORMAL HIGH (ref 0–200)
Total CHOL/HDL Ratio: 5
Triglycerides: 105 mg/dL (ref 0.0–149.0)
VLDL: 21 mg/dL (ref 0.0–40.0)

## 2013-05-25 LAB — HEPATIC FUNCTION PANEL
Albumin: 3.6 g/dL (ref 3.5–5.2)
Alkaline Phosphatase: 74 U/L (ref 39–117)
Total Bilirubin: 0.7 mg/dL (ref 0.3–1.2)

## 2013-06-02 ENCOUNTER — Ambulatory Visit (INDEPENDENT_AMBULATORY_CARE_PROVIDER_SITE_OTHER): Payer: 59 | Admitting: Internal Medicine

## 2013-06-02 ENCOUNTER — Encounter: Payer: Self-pay | Admitting: Internal Medicine

## 2013-06-02 VITALS — BP 120/70 | HR 72 | Temp 97.7°F | Resp 16 | Wt 144.0 lb

## 2013-06-02 DIAGNOSIS — E785 Hyperlipidemia, unspecified: Secondary | ICD-10-CM

## 2013-06-02 DIAGNOSIS — R7989 Other specified abnormal findings of blood chemistry: Secondary | ICD-10-CM

## 2013-06-02 DIAGNOSIS — M25569 Pain in unspecified knee: Secondary | ICD-10-CM

## 2013-06-02 DIAGNOSIS — M25562 Pain in left knee: Secondary | ICD-10-CM

## 2013-06-02 DIAGNOSIS — I10 Essential (primary) hypertension: Secondary | ICD-10-CM

## 2013-06-02 DIAGNOSIS — K219 Gastro-esophageal reflux disease without esophagitis: Secondary | ICD-10-CM

## 2013-06-02 MED ORDER — MELOXICAM 15 MG PO TABS: 15.0000 mg | ORAL_TABLET | Freq: Every day | ORAL | Status: DC

## 2013-06-02 MED ORDER — EZETIMIBE 10 MG PO TABS: 10.0000 mg | ORAL_TABLET | Freq: Every day | ORAL | Status: DC

## 2013-06-02 NOTE — Assessment & Plan Note (Signed)
Chronic Statin intolerant - off Rx

## 2013-06-02 NOTE — Assessment & Plan Note (Signed)
Resolved

## 2013-06-02 NOTE — Assessment & Plan Note (Signed)
Continue with current prescription therapy as reflected on the Med list.  

## 2013-06-02 NOTE — Assessment & Plan Note (Signed)
Doing well 

## 2013-06-08 ENCOUNTER — Other Ambulatory Visit: Payer: Self-pay

## 2013-06-08 MED ORDER — PROPRANOLOL HCL ER 120 MG PO CP24: 120.0000 mg | ORAL_CAPSULE | ORAL | Status: DC

## 2013-06-08 MED ORDER — OMEPRAZOLE 20 MG PO CPDR: 40.0000 mg | DELAYED_RELEASE_CAPSULE | Freq: Every day | ORAL | Status: DC

## 2013-06-08 MED ORDER — ASPIRIN-DIPYRIDAMOLE ER 25-200 MG PO CP12: 1.0000 | ORAL_CAPSULE | Freq: Two times a day (BID) | ORAL | Status: DC

## 2013-06-09 ENCOUNTER — Ambulatory Visit: Payer: 59 | Admitting: Internal Medicine

## 2013-06-27 NOTE — Progress Notes (Signed)
   Subjective:    HPI  The patient is here to follow up on chronic depression, anxiety, HTN and chronic moderate OA/LBP symptoms controlled with medicines, diet and exercise. Lipitor caused arthralgias - not taking it now at all  Wt Readings from Last 3 Encounters:  06/02/13 144 lb (65.318 kg)  02/10/13 149 lb (67.586 kg)  11/09/12 145 lb (65.772 kg)   BP Readings from Last 3 Encounters:  06/02/13 120/70  02/10/13 130/80  11/09/12 140/80   C/o uterine prolapse and pelvic pains  Review of Systems  Constitutional: Negative.  Negative for fever, chills, diaphoresis, activity change, appetite change, fatigue and unexpected weight change.  HENT: Negative for hearing loss, ear pain, nosebleeds, congestion, sore throat, facial swelling, rhinorrhea, sneezing, mouth sores, trouble swallowing, neck pain, neck stiffness, postnasal drip, sinus pressure and tinnitus.   Eyes: Negative for pain, discharge, redness, itching and visual disturbance.  Respiratory: Negative for cough, chest tightness, shortness of breath, wheezing and stridor.   Cardiovascular: Negative for chest pain, palpitations and leg swelling.  Gastrointestinal: Negative for nausea, diarrhea, constipation, blood in stool, abdominal distention, anal bleeding and rectal pain.  Genitourinary: Positive for urgency and pelvic pain. Negative for dysuria, frequency, hematuria, flank pain, vaginal bleeding, vaginal discharge, difficulty urinating and genital sores.  Musculoskeletal: Positive for arthralgias. Negative for back pain, joint swelling and gait problem.  Skin: Negative.  Negative for rash.  Neurological: Negative for dizziness, tremors, seizures, syncope, speech difficulty, weakness, numbness and headaches.  Hematological: Negative for adenopathy. Does not bruise/bleed easily.  Psychiatric/Behavioral: Negative for suicidal ideas, behavioral problems, sleep disturbance, dysphoric mood and decreased concentration. The patient is  nervous/anxious.        Objective:   Physical Exam  Constitutional: She appears well-developed and well-nourished. No distress.  HENT:  Head: Normocephalic.  Right Ear: External ear normal.  Left Ear: External ear normal.  Nose: Nose normal.  Mouth/Throat: Oropharynx is clear and moist.  Eyes: Conjunctivae are normal. Pupils are equal, round, and reactive to light. Right eye exhibits no discharge. Left eye exhibits no discharge.  Neck: Normal range of motion. Neck supple. No JVD present. No tracheal deviation present. No thyromegaly present.  Cardiovascular: Normal rate, regular rhythm and normal heart sounds.   Pulmonary/Chest: No stridor. No respiratory distress. She has no wheezes.  Abdominal: Soft. Bowel sounds are normal. She exhibits no distension and no mass. There is no tenderness. There is no rebound and no guarding.  Musculoskeletal: She exhibits no edema and no tenderness.  Lymphadenopathy:    She has no cervical adenopathy.  Neurological: She displays normal reflexes. No cranial nerve deficit. She exhibits normal muscle tone. Coordination normal.  Skin: No rash noted. No erythema.  Psychiatric: Her behavior is normal. Judgment and thought content normal.  anxious   Lab Results  Component Value Date   WBC 4.5 11/02/2012   HGB 11.5* 11/02/2012   HCT 35.3* 11/02/2012   PLT 247.0 11/02/2012   GLUCOSE 94 05/25/2013   CHOL 237* 05/25/2013   TRIG 105.0 05/25/2013   HDL 47.50 05/25/2013   LDLDIRECT 165.2 05/25/2013   LDLCALC 112* 03/18/2011   ALT 20 05/25/2013   AST 13 05/25/2013   NA 139 05/25/2013   K 4.0 05/25/2013   CL 107 05/25/2013   CREATININE 0.7 05/25/2013   BUN 19 05/25/2013   CO2 24 05/25/2013   TSH 1.90 04/20/2012   HGBA1C 5.9 09/29/2007         Assessment & Plan:

## 2013-07-11 ENCOUNTER — Other Ambulatory Visit: Payer: Self-pay | Admitting: *Deleted

## 2013-07-11 ENCOUNTER — Telehealth: Payer: Self-pay | Admitting: *Deleted

## 2013-07-11 MED ORDER — CAPTOPRIL 100 MG PO TABS: 100.0000 mg | ORAL_TABLET | Freq: Three times a day (TID) | ORAL | Status: DC

## 2013-07-11 MED ORDER — OMEPRAZOLE 20 MG PO CPDR: 40.0000 mg | DELAYED_RELEASE_CAPSULE | Freq: Every day | ORAL | Status: DC

## 2013-07-11 MED ORDER — EZETIMIBE 10 MG PO TABS: 10.0000 mg | ORAL_TABLET | Freq: Every day | ORAL | Status: DC

## 2013-07-11 MED ORDER — ASPIRIN-DIPYRIDAMOLE ER 25-200 MG PO CP12: 1.0000 | ORAL_CAPSULE | Freq: Two times a day (BID) | ORAL | Status: DC

## 2013-07-11 MED ORDER — MELOXICAM 15 MG PO TABS: 15.0000 mg | ORAL_TABLET | Freq: Every day | ORAL | Status: DC

## 2013-07-11 MED ORDER — PROPRANOLOL HCL ER 120 MG PO CP24: 120.0000 mg | ORAL_CAPSULE | ORAL | Status: DC

## 2013-07-11 NOTE — Telephone Encounter (Signed)
OK to fill this prescription with additional refills x1 Thank you!  

## 2013-07-11 NOTE — Telephone Encounter (Signed)
Rf req for Alprazolam 0.5 mg 1 po tid prn. 90 day supply. Ok to Rf?

## 2013-07-12 MED ORDER — ALPRAZOLAM 0.5 MG PO TABS: 0.5000 mg | ORAL_TABLET | Freq: Three times a day (TID) | ORAL | Status: DC | PRN

## 2013-07-12 NOTE — Telephone Encounter (Signed)
Done

## 2013-09-01 ENCOUNTER — Other Ambulatory Visit (INDEPENDENT_AMBULATORY_CARE_PROVIDER_SITE_OTHER): Payer: 59

## 2013-09-01 DIAGNOSIS — M25569 Pain in unspecified knee: Secondary | ICD-10-CM

## 2013-09-01 DIAGNOSIS — I10 Essential (primary) hypertension: Secondary | ICD-10-CM

## 2013-09-01 DIAGNOSIS — K219 Gastro-esophageal reflux disease without esophagitis: Secondary | ICD-10-CM

## 2013-09-01 DIAGNOSIS — R7989 Other specified abnormal findings of blood chemistry: Secondary | ICD-10-CM

## 2013-09-01 DIAGNOSIS — E785 Hyperlipidemia, unspecified: Secondary | ICD-10-CM

## 2013-09-01 DIAGNOSIS — M25562 Pain in left knee: Secondary | ICD-10-CM

## 2013-09-01 LAB — HEPATIC FUNCTION PANEL
ALT: 29 U/L (ref 0–35)
AST: 20 U/L (ref 0–37)
Albumin: 3.6 g/dL (ref 3.5–5.2)
Alkaline Phosphatase: 79 U/L (ref 39–117)
Total Protein: 6.6 g/dL (ref 6.0–8.3)

## 2013-09-01 LAB — BASIC METABOLIC PANEL
CO2: 30 mEq/L (ref 19–32)
Calcium: 8.5 mg/dL (ref 8.4–10.5)
Creatinine, Ser: 0.7 mg/dL (ref 0.4–1.2)
GFR: 86.14 mL/min (ref >60.00)
Sodium: 136 mEq/L (ref 135–145)

## 2013-09-01 LAB — LIPID PANEL
Cholesterol: 198 mg/dL (ref 0–200)
HDL: 54.3 mg/dL (ref >39.00)
Total CHOL/HDL Ratio: 4
Triglycerides: 82 mg/dL (ref 0.0–149.0)

## 2013-09-07 ENCOUNTER — Ambulatory Visit (INDEPENDENT_AMBULATORY_CARE_PROVIDER_SITE_OTHER): Payer: Medicare Other | Admitting: Internal Medicine

## 2013-09-07 ENCOUNTER — Encounter: Payer: Self-pay | Admitting: Internal Medicine

## 2013-09-07 VITALS — BP 120/71 | HR 61 | Temp 96.8°F | Wt 142.0 lb

## 2013-09-07 DIAGNOSIS — Z23 Encounter for immunization: Secondary | ICD-10-CM

## 2013-09-07 DIAGNOSIS — M545 Low back pain, unspecified: Secondary | ICD-10-CM

## 2013-09-07 DIAGNOSIS — F411 Generalized anxiety disorder: Secondary | ICD-10-CM

## 2013-09-07 DIAGNOSIS — E785 Hyperlipidemia, unspecified: Secondary | ICD-10-CM

## 2013-09-07 DIAGNOSIS — I10 Essential (primary) hypertension: Secondary | ICD-10-CM

## 2013-09-07 MED ORDER — GABAPENTIN 100 MG PO CAPS: 100.0000 mg | ORAL_CAPSULE | Freq: Three times a day (TID) | ORAL | Status: DC

## 2013-09-07 NOTE — Patient Instructions (Signed)
Gluten free trial (no wheat products) for 4-6 weeks. OK to use gluten-free bread and gluten-free pasta.  Milk free trial (no milk, ice cream, cheese and yogurt) for 4-6 weeks. OK to use almond, coconut, rice or soy milk. "Almond breeze" brand tastes good.  

## 2013-09-07 NOTE — Progress Notes (Signed)
Subjective:    HPI  The patient is here to follow up on chronic depression, anxiety, HTN and chronic moderate OA/LBP symptoms controlled with medicines, diet and exercise. Lipitor caused arthralgias - not taking it now at all. She is on Zetia now  Wt Readings from Last 3 Encounters:  09/07/13 142 lb (64.411 kg)  06/02/13 144 lb (65.318 kg)  02/10/13 149 lb (67.586 kg)   BP Readings from Last 3 Encounters:  09/07/13 120/71  06/02/13 120/70  02/10/13 130/80   C/o uterine prolapse and pelvic pains  Review of Systems  Constitutional: Negative.  Negative for fever, chills, diaphoresis, activity change, appetite change, fatigue and unexpected weight change.  HENT: Negative for hearing loss, ear pain, nosebleeds, congestion, sore throat, facial swelling, rhinorrhea, sneezing, mouth sores, trouble swallowing, neck pain, neck stiffness, postnasal drip, sinus pressure and tinnitus.   Eyes: Negative for pain, discharge, redness, itching and visual disturbance.  Respiratory: Negative for cough, chest tightness, shortness of breath, wheezing and stridor.   Cardiovascular: Negative for chest pain, palpitations and leg swelling.  Gastrointestinal: Negative for nausea, diarrhea, constipation, blood in stool, abdominal distention, anal bleeding and rectal pain.  Genitourinary: Positive for urgency and pelvic pain. Negative for dysuria, frequency, hematuria, flank pain, vaginal bleeding, vaginal discharge, difficulty urinating and genital sores.  Musculoskeletal: Positive for arthralgias. Negative for back pain, joint swelling and gait problem.  Skin: Negative.  Negative for rash.  Neurological: Negative for dizziness, tremors, seizures, syncope, speech difficulty, weakness, numbness and headaches.  Hematological: Negative for adenopathy. Does not bruise/bleed easily.  Psychiatric/Behavioral: Negative for suicidal ideas, behavioral problems, sleep disturbance, dysphoric mood and decreased  concentration. The patient is nervous/anxious.        Objective:   Physical Exam  Constitutional: She appears well-developed and well-nourished. No distress.  HENT:  Head: Normocephalic.  Right Ear: External ear normal.  Left Ear: External ear normal.  Nose: Nose normal.  Mouth/Throat: Oropharynx is clear and moist.  Eyes: Conjunctivae are normal. Pupils are equal, round, and reactive to light. Right eye exhibits no discharge. Left eye exhibits no discharge.  Neck: Normal range of motion. Neck supple. No JVD present. No tracheal deviation present. No thyromegaly present.  Cardiovascular: Normal rate, regular rhythm and normal heart sounds.   Pulmonary/Chest: No stridor. No respiratory distress. She has no wheezes.  Abdominal: Soft. Bowel sounds are normal. She exhibits no distension and no mass. There is no tenderness. There is no rebound and no guarding.  Musculoskeletal: She exhibits no edema and no tenderness.  Lymphadenopathy:    She has no cervical adenopathy.  Neurological: She displays normal reflexes. No cranial nerve deficit. She exhibits normal muscle tone. Coordination normal.  Skin: No rash noted. No erythema.  Psychiatric: Her behavior is normal. Judgment and thought content normal.  anxious   Lab Results  Component Value Date   WBC 4.5 11/02/2012   HGB 11.5* 11/02/2012   HCT 35.3* 11/02/2012   PLT 247.0 11/02/2012   GLUCOSE 90 09/01/2013   CHOL 198 09/01/2013   TRIG 82.0 09/01/2013   HDL 54.30 09/01/2013   LDLDIRECT 165.2 05/25/2013   LDLCALC 127* 09/01/2013   ALT 29 09/01/2013   AST 20 09/01/2013   NA 136 09/01/2013   K 4.2 09/01/2013   CL 102 09/01/2013   CREATININE 0.7 09/01/2013   BUN 15 09/01/2013   CO2 30 09/01/2013   TSH 1.90 04/20/2012   HGBA1C 5.9 09/29/2007         Assessment & Plan:

## 2013-09-11 NOTE — Assessment & Plan Note (Signed)
  On diet  

## 2013-09-11 NOTE — Assessment & Plan Note (Signed)
Doing ok.

## 2013-09-11 NOTE — Assessment & Plan Note (Signed)
Continue with current prescription therapy as reflected on the Med list.  

## 2013-12-02 ENCOUNTER — Encounter: Payer: Self-pay | Admitting: Internal Medicine

## 2014-01-11 ENCOUNTER — Encounter: Payer: Self-pay | Admitting: Internal Medicine

## 2014-01-11 ENCOUNTER — Ambulatory Visit (INDEPENDENT_AMBULATORY_CARE_PROVIDER_SITE_OTHER): Payer: Medicare Other | Admitting: Internal Medicine

## 2014-01-11 VITALS — BP 120/78 | HR 72 | Temp 98.2°F | Resp 16 | Wt 145.0 lb

## 2014-01-11 DIAGNOSIS — R7309 Other abnormal glucose: Secondary | ICD-10-CM

## 2014-01-11 DIAGNOSIS — E785 Hyperlipidemia, unspecified: Secondary | ICD-10-CM

## 2014-01-11 DIAGNOSIS — I10 Essential (primary) hypertension: Secondary | ICD-10-CM

## 2014-01-11 DIAGNOSIS — R799 Abnormal finding of blood chemistry, unspecified: Secondary | ICD-10-CM

## 2014-01-11 DIAGNOSIS — M545 Low back pain, unspecified: Secondary | ICD-10-CM

## 2014-01-11 DIAGNOSIS — M1990 Unspecified osteoarthritis, unspecified site: Secondary | ICD-10-CM

## 2014-01-11 DIAGNOSIS — F411 Generalized anxiety disorder: Secondary | ICD-10-CM

## 2014-01-11 MED ORDER — ALPRAZOLAM 0.5 MG PO TABS: 0.5000 mg | ORAL_TABLET | Freq: Three times a day (TID) | ORAL | Status: DC | PRN

## 2014-01-11 MED ORDER — BUTALBITAL-APAP-CAFFEINE 50-325-40 MG PO TABS: 1.0000 | ORAL_TABLET | Freq: Two times a day (BID) | ORAL | Status: DC | PRN

## 2014-01-11 NOTE — Assessment & Plan Note (Signed)
Continue with current prescription therapy as reflected on the Med list.  

## 2014-01-11 NOTE — Assessment & Plan Note (Signed)
Labs

## 2014-01-11 NOTE — Progress Notes (Signed)
[name], female   DOB: 1934/07/05, 80 y.o.   MRN: [id number]   Subjective:    HPI  The patient is here to follow up on chronic depression, anxiety, HTN and chronic moderate OA/LBP symptoms controlled with medicines, diet and exercise. Lipitor caused arthralgias - not taking it now at all  Wt Readings from Last 3 Encounters:  01/11/14 145 lb (65.772 kg)  09/07/13 142 lb (64.411 kg)  06/02/13 144 lb (65.318 kg)   BP Readings from Last 3 Encounters:  01/11/14 120/78  09/07/13 120/71  06/02/13 120/70   C/o uterine prolapse and pelvic pains  Review of Systems  Constitutional: Negative.  Negative for fever, chills, diaphoresis, activity change, appetite change, fatigue and unexpected weight change.  HENT: Negative for congestion, ear pain, facial swelling, hearing loss, mouth sores, nosebleeds, postnasal drip, rhinorrhea, sinus pressure, sneezing, sore throat, tinnitus and trouble swallowing.   Eyes: Negative for pain, discharge, redness, itching and visual disturbance.  Respiratory: Negative for cough, chest tightness, shortness of breath, wheezing and stridor.   Cardiovascular: Negative for chest pain, palpitations and leg swelling.  Gastrointestinal: Negative for nausea, diarrhea, constipation, blood in stool, abdominal distention, anal bleeding and rectal pain.  Genitourinary: Positive for urgency and pelvic pain. Negative for dysuria, frequency, hematuria, flank pain, vaginal bleeding, vaginal discharge, difficulty urinating and genital sores.  Musculoskeletal: Positive for arthralgias. Negative for back pain, gait problem, joint swelling, neck pain and neck stiffness.  Skin: Negative.  Negative for rash.  Neurological: Negative for dizziness, tremors, seizures, syncope, speech difficulty, weakness, numbness and headaches.  Hematological: Negative for adenopathy. Does not bruise/bleed easily.  Psychiatric/Behavioral: Negative for suicidal ideas, behavioral problems,  sleep disturbance, dysphoric mood and decreased concentration. The patient is nervous/anxious.        Objective:   Physical Exam  Constitutional: She appears well-developed and well-nourished. No distress.  HENT:  Head: Normocephalic.  Right Ear: External ear normal.  Left Ear: External ear normal.  Nose: Nose normal.  Mouth/Throat: Oropharynx is clear and moist.  Eyes: Conjunctivae are normal. Pupils are equal, round, and reactive to light. Right eye exhibits no discharge. Left eye exhibits no discharge.  Neck: Normal range of motion. Neck supple. No JVD present. No tracheal deviation present. No thyromegaly present.  Cardiovascular: Normal rate, regular rhythm and normal heart sounds.   Pulmonary/Chest: No stridor. No respiratory distress. She has no wheezes.  Abdominal: Soft. Bowel sounds are normal. She exhibits no distension and no mass. There is no tenderness. There is no rebound and no guarding.  Musculoskeletal: She exhibits no edema and no tenderness.  Lymphadenopathy:    She has no cervical adenopathy.  Neurological: She displays normal reflexes. No cranial nerve deficit. She exhibits normal muscle tone. Coordination normal.  Skin: No rash noted. No erythema.  Psychiatric: Her behavior is normal. Judgment and thought content normal.  anxious   Lab Results  Component Value Date   WBC 4.5 11/02/2012   HGB 11.5* 11/02/2012   HCT 35.3* 11/02/2012   PLT 247.0 11/02/2012   GLUCOSE 90 09/01/2013   CHOL 198 09/01/2013   TRIG 82.0 09/01/2013   HDL 54.30 09/01/2013   LDLDIRECT 165.2 05/25/2013   LDLCALC 127* 09/01/2013   ALT 29 09/01/2013   AST 20 09/01/2013   NA 136 09/01/2013   K 4.2 09/01/2013   CL 102 09/01/2013   CREATININE 0.7 09/01/2013   BUN 15 09/01/2013   CO2 30 09/01/2013   TSH 1.90 04/20/2012   HGBA1C 5.9 09/29/2007  Assessment & Plan:

## 2014-01-11 NOTE — Progress Notes (Signed)
Pre visit review using our clinic review tool, if applicable. No additional management support is needed unless otherwise documented below in the visit note. 

## 2014-01-16 ENCOUNTER — Telehealth: Payer: Self-pay | Admitting: Internal Medicine

## 2014-01-16 NOTE — Telephone Encounter (Signed)
Opened in error

## 2014-04-03 ENCOUNTER — Other Ambulatory Visit (INDEPENDENT_AMBULATORY_CARE_PROVIDER_SITE_OTHER): Payer: 59

## 2014-04-03 DIAGNOSIS — E785 Hyperlipidemia, unspecified: Secondary | ICD-10-CM

## 2014-04-03 DIAGNOSIS — I10 Essential (primary) hypertension: Secondary | ICD-10-CM

## 2014-04-03 DIAGNOSIS — M545 Low back pain, unspecified: Secondary | ICD-10-CM

## 2014-04-03 DIAGNOSIS — F411 Generalized anxiety disorder: Secondary | ICD-10-CM

## 2014-04-03 DIAGNOSIS — R799 Abnormal finding of blood chemistry, unspecified: Secondary | ICD-10-CM

## 2014-04-03 DIAGNOSIS — M1990 Unspecified osteoarthritis, unspecified site: Secondary | ICD-10-CM

## 2014-04-03 DIAGNOSIS — R7309 Other abnormal glucose: Secondary | ICD-10-CM

## 2014-04-03 LAB — HEPATIC FUNCTION PANEL
ALT: 17 U/L (ref 0–35)
AST: 16 U/L (ref 0–37)
Albumin: 3.3 g/dL — ABNORMAL LOW (ref 3.5–5.2)
Alkaline Phosphatase: 78 U/L (ref 39–117)
BILIRUBIN TOTAL: 0.8 mg/dL (ref 0.3–1.2)
Bilirubin, Direct: 0.1 mg/dL (ref 0.0–0.3)
Total Protein: 6.3 g/dL (ref 6.0–8.3)

## 2014-04-03 LAB — CBC WITH DIFFERENTIAL/PLATELET
BASOS ABS: 0 10*3/uL (ref 0.0–0.1)
Basophils Relative: 0.5 % (ref 0.0–3.0)
Eosinophils Absolute: 0 10*3/uL (ref 0.0–0.7)
Eosinophils Relative: 1.2 % (ref 0.0–5.0)
HCT: 35 % — ABNORMAL LOW (ref 36.0–46.0)
HEMOGLOBIN: 11.8 g/dL — AB (ref 12.0–15.0)
LYMPHS ABS: 1.9 10*3/uL (ref 0.7–4.0)
Lymphocytes Relative: 46.6 % — ABNORMAL HIGH (ref 12.0–46.0)
MCHC: 33.8 g/dL (ref 30.0–36.0)
MCV: 95.4 fl (ref 78.0–100.0)
Monocytes Absolute: 0.4 10*3/uL (ref 0.1–1.0)
Monocytes Relative: 8.8 % (ref 3.0–12.0)
Neutro Abs: 1.8 10*3/uL (ref 1.4–7.7)
Neutrophils Relative %: 42.9 % — ABNORMAL LOW (ref 43.0–77.0)
Platelets: 221 10*3/uL (ref 150.0–400.0)
RBC: 3.67 Mil/uL — ABNORMAL LOW (ref 3.87–5.11)
RDW: 13.8 % (ref 11.5–14.6)
WBC: 4.2 10*3/uL — ABNORMAL LOW (ref 4.5–10.5)

## 2014-04-03 LAB — LIPID PANEL
CHOL/HDL RATIO: 3
Cholesterol: 182 mg/dL (ref 0–200)
HDL: 53.8 mg/dL (ref >39.00)
LDL Cholesterol: 113 mg/dL — ABNORMAL HIGH (ref 0–99)
Triglycerides: 75 mg/dL (ref 0.0–149.0)
VLDL: 15 mg/dL (ref 0.0–40.0)

## 2014-04-03 LAB — BASIC METABOLIC PANEL
BUN: 20 mg/dL (ref 6–23)
CHLORIDE: 106 meq/L (ref 96–112)
CO2: 27 meq/L (ref 19–32)
Calcium: 8.7 mg/dL (ref 8.4–10.5)
Creatinine, Ser: 0.7 mg/dL (ref 0.4–1.2)
GFR: 81.94 mL/min (ref >60.00)
GLUCOSE: 101 mg/dL — AB (ref 70–99)
POTASSIUM: 4.4 meq/L (ref 3.5–5.1)
SODIUM: 139 meq/L (ref 135–145)

## 2014-04-13 ENCOUNTER — Encounter: Payer: Self-pay | Admitting: Internal Medicine

## 2014-04-13 ENCOUNTER — Ambulatory Visit (INDEPENDENT_AMBULATORY_CARE_PROVIDER_SITE_OTHER)
Admission: RE | Admit: 2014-04-13 | Discharge: 2014-04-13 | Disposition: A | Discharge status: Home / Self Care | Payer: 59 | Source: Ambulatory Visit | Attending: Internal Medicine | Admitting: Internal Medicine

## 2014-04-13 ENCOUNTER — Ambulatory Visit (INDEPENDENT_AMBULATORY_CARE_PROVIDER_SITE_OTHER): Payer: 59 | Admitting: Internal Medicine

## 2014-04-13 VITALS — BP 110/62 | HR 64 | Temp 97.8°F | Resp 16 | Wt 142.0 lb

## 2014-04-13 DIAGNOSIS — R799 Abnormal finding of blood chemistry, unspecified: Secondary | ICD-10-CM

## 2014-04-13 DIAGNOSIS — K219 Gastro-esophageal reflux disease without esophagitis: Secondary | ICD-10-CM

## 2014-04-13 DIAGNOSIS — M949 Disorder of cartilage, unspecified: Secondary | ICD-10-CM

## 2014-04-13 DIAGNOSIS — M25552 Pain in left hip: Secondary | ICD-10-CM

## 2014-04-13 DIAGNOSIS — M899 Disorder of bone, unspecified: Secondary | ICD-10-CM

## 2014-04-13 DIAGNOSIS — M25559 Pain in unspecified hip: Secondary | ICD-10-CM

## 2014-04-13 MED ORDER — OMEPRAZOLE 20 MG PO CPDR: 40.0000 mg | DELAYED_RELEASE_CAPSULE | Freq: Every day | ORAL | Status: DC

## 2014-04-13 MED ORDER — PROPRANOLOL HCL ER 120 MG PO CP24: 120.0000 mg | ORAL_CAPSULE | ORAL | Status: DC

## 2014-04-13 MED ORDER — CAPTOPRIL 100 MG PO TABS: 100.0000 mg | ORAL_TABLET | Freq: Three times a day (TID) | ORAL | Status: DC

## 2014-04-13 MED ORDER — EZETIMIBE 10 MG PO TABS: 10.0000 mg | ORAL_TABLET | Freq: Every day | ORAL | Status: DC

## 2014-04-13 MED ORDER — ASPIRIN-DIPYRIDAMOLE ER 25-200 MG PO CP12: 1.0000 | ORAL_CAPSULE | Freq: Two times a day (BID) | ORAL | Status: DC

## 2014-04-13 NOTE — Assessment & Plan Note (Signed)
4/15 chronic, mild X ray

## 2014-04-13 NOTE — Assessment & Plan Note (Signed)
Continue with current prescription therapy as reflected on the Med list.  

## 2014-04-13 NOTE — Progress Notes (Deleted)
Pre visit review using our clinic review tool, if applicable. No additional management support is needed unless otherwise documented below in the visit note. 

## 2014-04-13 NOTE — Assessment & Plan Note (Signed)
Watching labs 

## 2014-04-13 NOTE — Progress Notes (Signed)
Subjective:    HPI  The patient is here to follow up on chronic depression, anxiety, HTN and chronic moderate OA/LBP symptoms controlled with medicines, diet and exercise. Lipitor caused arthralgias - taking Zetia C/o issues with teeth - all teeth are to be extracted C/oL hip pain x weeks - better now  Wt Readings from Last 3 Encounters:  04/13/14 142 lb (64.411 kg)  01/11/14 145 lb (65.772 kg)  09/07/13 142 lb (64.411 kg)   BP Readings from Last 3 Encounters:  04/13/14 110/62  01/11/14 120/78  09/07/13 120/71   F/u uterine prolapse and pelvic pains  Review of Systems  Constitutional: Negative.  Negative for fever, chills, diaphoresis, activity change, appetite change, fatigue and unexpected weight change.  HENT: Negative for congestion, ear pain, facial swelling, hearing loss, mouth sores, nosebleeds, postnasal drip, rhinorrhea, sinus pressure, sneezing, sore throat, tinnitus and trouble swallowing.   Eyes: Negative for pain, discharge, redness, itching and visual disturbance.  Respiratory: Negative for cough, chest tightness, shortness of breath, wheezing and stridor.   Cardiovascular: Negative for chest pain, palpitations and leg swelling.  Gastrointestinal: Negative for nausea, diarrhea, constipation, blood in stool, abdominal distention, anal bleeding and rectal pain.  Genitourinary: Positive for urgency and pelvic pain. Negative for dysuria, frequency, hematuria, flank pain, vaginal bleeding, vaginal discharge, difficulty urinating and genital sores.  Musculoskeletal: Positive for arthralgias. Negative for back pain, gait problem, joint swelling, neck pain and neck stiffness.  Skin: Negative.  Negative for rash.  Neurological: Negative for dizziness, tremors, seizures, syncope, speech difficulty, weakness, numbness and headaches.  Hematological: Negative for adenopathy. Does not bruise/bleed easily.  Psychiatric/Behavioral: Negative for suicidal ideas, behavioral problems,  sleep disturbance, dysphoric mood and decreased concentration. The patient is nervous/anxious.        Objective:   Physical Exam  Constitutional: She appears well-developed and well-nourished. No distress.  HENT:  Head: Normocephalic.  Right Ear: External ear normal.  Left Ear: External ear normal.  Nose: Nose normal.  Mouth/Throat: Oropharynx is clear and moist.  Eyes: Conjunctivae are normal. Pupils are equal, round, and reactive to light. Right eye exhibits no discharge. Left eye exhibits no discharge.  Neck: Normal range of motion. Neck supple. No JVD present. No tracheal deviation present. No thyromegaly present.  Cardiovascular: Normal rate, regular rhythm and normal heart sounds.   Pulmonary/Chest: No stridor. No respiratory distress. She has no wheezes.  Abdominal: Soft. Bowel sounds are normal. She exhibits no distension and no mass. There is no tenderness. There is no rebound and no guarding.  Musculoskeletal: She exhibits no edema and no tenderness.  Lymphadenopathy:    She has no cervical adenopathy.  Neurological: She displays normal reflexes. No cranial nerve deficit. She exhibits normal muscle tone. Coordination normal.  Skin: No rash noted. No erythema.  Psychiatric: Her behavior is normal. Judgment and thought content normal.  anxious  L hip is NT  Lab Results  Component Value Date   WBC 4.2* 04/03/2014   HGB 11.8* 04/03/2014   HCT 35.0* 04/03/2014   PLT 221.0 04/03/2014   GLUCOSE 101* 04/03/2014   CHOL 182 04/03/2014   TRIG 75.0 04/03/2014   HDL 53.80 04/03/2014   LDLDIRECT 165.2 05/25/2013   LDLCALC 113* 04/03/2014   ALT 17 04/03/2014   AST 16 04/03/2014   NA 139 04/03/2014   K 4.4 04/03/2014   CL 106 04/03/2014   CREATININE 0.7 04/03/2014   BUN 20 04/03/2014   CO2 27 04/03/2014   TSH 1.90 04/20/2012  HGBA1C 5.9 09/29/2007         Assessment & Plan:

## 2014-04-13 NOTE — Assessment & Plan Note (Signed)
Vit D 

## 2014-07-28 ENCOUNTER — Other Ambulatory Visit (INDEPENDENT_AMBULATORY_CARE_PROVIDER_SITE_OTHER): Payer: Medicare Other

## 2014-07-28 DIAGNOSIS — K219 Gastro-esophageal reflux disease without esophagitis: Secondary | ICD-10-CM

## 2014-07-28 DIAGNOSIS — R799 Abnormal finding of blood chemistry, unspecified: Secondary | ICD-10-CM

## 2014-07-28 LAB — CBC WITH DIFFERENTIAL/PLATELET
Basophils Absolute: 0 10*3/uL (ref 0.0–0.1)
Basophils Relative: 0.6 % (ref 0.0–3.0)
Eosinophils Absolute: 0.1 10*3/uL (ref 0.0–0.7)
Eosinophils Relative: 2.3 % (ref 0.0–5.0)
HEMATOCRIT: 34.5 % — AB (ref 36.0–46.0)
Hemoglobin: 11.5 g/dL — ABNORMAL LOW (ref 12.0–15.0)
LYMPHS ABS: 2 10*3/uL (ref 0.7–4.0)
LYMPHS PCT: 38.6 % (ref 12.0–46.0)
MCHC: 33.2 g/dL (ref 30.0–36.0)
MCV: 96 fl (ref 78.0–100.0)
MONOS PCT: 9 % (ref 3.0–12.0)
Monocytes Absolute: 0.5 10*3/uL (ref 0.1–1.0)
Neutro Abs: 2.6 10*3/uL (ref 1.4–7.7)
Neutrophils Relative %: 49.5 % (ref 43.0–77.0)
PLATELETS: 206 10*3/uL (ref 150.0–400.0)
RBC: 3.6 Mil/uL — ABNORMAL LOW (ref 3.87–5.11)
RDW: 13.6 % (ref 11.5–15.5)
WBC: 5.2 10*3/uL (ref 4.0–10.5)

## 2014-07-28 LAB — BASIC METABOLIC PANEL
BUN: 16 mg/dL (ref 6–23)
CO2: 28 meq/L (ref 19–32)
Calcium: 9.3 mg/dL (ref 8.4–10.5)
Chloride: 106 mEq/L (ref 96–112)
Creatinine, Ser: 0.8 mg/dL (ref 0.4–1.2)
GFR: 75.84 mL/min (ref >60.00)
Glucose, Bld: 90 mg/dL (ref 70–99)
Potassium: 4.2 mEq/L (ref 3.5–5.1)
Sodium: 135 mEq/L (ref 135–145)

## 2014-08-14 ENCOUNTER — Ambulatory Visit: Payer: 59 | Admitting: Internal Medicine

## 2014-08-15 ENCOUNTER — Telehealth: Payer: Self-pay

## 2014-08-15 NOTE — Telephone Encounter (Signed)
Tried to schedule an  AWV with pt. There was a large language barrier between the two of [place].   She has an appt on 08/29/2014 with PCP.

## 2014-08-15 NOTE — Telephone Encounter (Signed)
Noted Keep OV Thx

## 2014-08-29 ENCOUNTER — Encounter: Payer: Self-pay | Admitting: Internal Medicine

## 2014-08-29 ENCOUNTER — Ambulatory Visit (INDEPENDENT_AMBULATORY_CARE_PROVIDER_SITE_OTHER): Payer: Medicare Other | Admitting: Internal Medicine

## 2014-08-29 VITALS — BP 140/80 | HR 80 | Temp 98.1°F | Resp 16 | Wt 141.0 lb

## 2014-08-29 DIAGNOSIS — M899 Disorder of bone, unspecified: Secondary | ICD-10-CM

## 2014-08-29 DIAGNOSIS — I10 Essential (primary) hypertension: Secondary | ICD-10-CM

## 2014-08-29 DIAGNOSIS — F411 Generalized anxiety disorder: Secondary | ICD-10-CM

## 2014-08-29 DIAGNOSIS — K219 Gastro-esophageal reflux disease without esophagitis: Secondary | ICD-10-CM

## 2014-08-29 DIAGNOSIS — Z23 Encounter for immunization: Secondary | ICD-10-CM

## 2014-08-29 DIAGNOSIS — M949 Disorder of cartilage, unspecified: Secondary | ICD-10-CM

## 2014-08-29 NOTE — Assessment & Plan Note (Signed)
Continue with current prn prescription therapy as reflected on the Med list.  

## 2014-08-29 NOTE — Assessment & Plan Note (Signed)
Vit D 

## 2014-08-29 NOTE — Progress Notes (Signed)
Pre visit review using our clinic review tool, if applicable. No additional management support is needed unless otherwise documented below in the visit note. 

## 2014-08-29 NOTE — Progress Notes (Signed)
[name], female   DOB: [birth year], 80 y.o.   MRN: [id number]   Subjective:    HPI  The patient is here to follow up on chronic depression, anxiety, HTN and chronic moderate OA/LBP symptoms controlled with medicines, diet and exercise. Lipitor caused arthralgias - taking Zetia F/u issues with teeth - all teeth are extracted - getting implants done F/u L hip pain x weeks - better now  Wt Readings from Last 3 Encounters:  08/29/14 141 lb (63.957 kg)  04/13/14 142 lb (64.411 kg)  01/11/14 145 lb (65.772 kg)   BP Readings from Last 3 Encounters:  08/29/14 140/80  04/13/14 110/62  01/11/14 120/78   F/u uterine prolapse and pelvic pains  Review of Systems  Constitutional: Negative.  Negative for fever, chills, diaphoresis, activity change, appetite change, fatigue and unexpected weight change.  HENT: Negative for congestion, ear pain, facial swelling, hearing loss, mouth sores, nosebleeds, postnasal drip, rhinorrhea, sinus pressure, sneezing, sore throat, tinnitus and trouble swallowing.   Eyes: Negative for pain, discharge, redness, itching and visual disturbance.  Respiratory: Negative for cough, chest tightness, shortness of breath, wheezing and stridor.   Cardiovascular: Negative for chest pain, palpitations and leg swelling.  Gastrointestinal: Negative for nausea, diarrhea, constipation, blood in stool, abdominal distention, anal bleeding and rectal pain.  Genitourinary: Positive for urgency and pelvic pain. Negative for dysuria, frequency, hematuria, flank pain, vaginal bleeding, vaginal discharge, difficulty urinating and genital sores.  Musculoskeletal: Positive for arthralgias. Negative for back pain, gait problem, joint swelling, neck pain and neck stiffness.  Skin: Negative.  Negative for rash.  Neurological: Negative for dizziness, tremors, seizures, syncope, speech difficulty, weakness, numbness and headaches.  Hematological: Negative for adenopathy. Does not  bruise/bleed easily.  Psychiatric/Behavioral: Negative for suicidal ideas, behavioral problems, sleep disturbance, dysphoric mood and decreased concentration. The patient is nervous/anxious.        Objective:   Physical Exam  Constitutional: She appears well-developed and well-nourished. No distress.  HENT:  Head: Normocephalic.  Right Ear: External ear normal.  Left Ear: External ear normal.  Nose: Nose normal.  Mouth/Throat: Oropharynx is clear and moist.  Eyes: Conjunctivae are normal. Pupils are equal, round, and reactive to light. Right eye exhibits no discharge. Left eye exhibits no discharge.  Neck: Normal range of motion. Neck supple. No JVD present. No tracheal deviation present. No thyromegaly present.  Cardiovascular: Normal rate, regular rhythm and normal heart sounds.   Pulmonary/Chest: No stridor. No respiratory distress. She has no wheezes.  Abdominal: Soft. Bowel sounds are normal. She exhibits no distension and no mass. There is no tenderness. There is no rebound and no guarding.  Musculoskeletal: She exhibits no edema and no tenderness.  Lymphadenopathy:    She has no cervical adenopathy.  Neurological: She displays normal reflexes. No cranial nerve deficit. She exhibits normal muscle tone. Coordination normal.  Skin: No rash noted. No erythema.  Psychiatric: Her behavior is normal. Judgment and thought content normal.  anxious  L hip is NT  Lab Results  Component Value Date   WBC 5.2 07/28/2014   HGB 11.5* 07/28/2014   HCT 34.5* 07/28/2014   PLT 206.0 07/28/2014   GLUCOSE 90 07/28/2014   CHOL 182 04/03/2014   TRIG 75.0 04/03/2014   HDL 53.80 04/03/2014   LDLDIRECT 165.2 05/25/2013   LDLCALC 113* 04/03/2014   ALT 17 04/03/2014   AST 16 04/03/2014   NA 135 07/28/2014   K 4.2 07/28/2014   CL 106 07/28/2014   CREATININE  0.8 07/28/2014   BUN 16 07/28/2014   CO2 28 07/28/2014   TSH 1.90 04/20/2012   HGBA1C 5.9 09/29/2007         Assessment & Plan:

## 2014-08-29 NOTE — Assessment & Plan Note (Signed)
Continue with current prescription therapy as reflected on the Med list.  

## 2014-10-26 LAB — HM MAMMOGRAPHY

## 2014-11-03 ENCOUNTER — Encounter: Payer: Self-pay | Admitting: Internal Medicine

## 2014-11-13 ENCOUNTER — Encounter: Payer: Self-pay | Admitting: Internal Medicine

## 2014-11-15 ENCOUNTER — Telehealth: Payer: Self-pay | Admitting: Internal Medicine

## 2014-11-15 NOTE — Telephone Encounter (Signed)
Would like to check on [employer] Transition form that was faxed over on OCT 1st, OCT 24th and  Nov 17th

## 2014-11-15 NOTE — Telephone Encounter (Signed)
Will refax another copy today

## 2014-11-16 NOTE — Telephone Encounter (Signed)
Noted../lmb 

## 2014-12-04 ENCOUNTER — Ambulatory Visit: Payer: Medicare Other | Admitting: Internal Medicine

## 2014-12-15 DIAGNOSIS — Q041 Arhinencephaly: Secondary | ICD-10-CM | POA: Diagnosis not present

## 2014-12-18 DIAGNOSIS — Q041 Arhinencephaly: Secondary | ICD-10-CM | POA: Diagnosis not present

## 2014-12-19 ENCOUNTER — Other Ambulatory Visit (INDEPENDENT_AMBULATORY_CARE_PROVIDER_SITE_OTHER): Payer: Medicare Other

## 2014-12-19 DIAGNOSIS — Q041 Arhinencephaly: Secondary | ICD-10-CM | POA: Diagnosis not present

## 2014-12-19 DIAGNOSIS — E785 Hyperlipidemia, unspecified: Secondary | ICD-10-CM | POA: Diagnosis not present

## 2014-12-19 LAB — LIPID PANEL
CHOLESTEROL: 201 mg/dL — AB (ref 0–200)
HDL: 55.9 mg/dL (ref >39.00)
LDL CALC: 132 mg/dL — AB (ref 0–99)
NonHDL: 145.1
Total CHOL/HDL Ratio: 4
Triglycerides: 67 mg/dL (ref 0.0–149.0)
VLDL: 13.4 mg/dL (ref 0.0–40.0)

## 2014-12-19 LAB — BASIC METABOLIC PANEL
BUN: 18 mg/dL (ref 6–23)
CALCIUM: 9 mg/dL (ref 8.4–10.5)
CO2: 28 meq/L (ref 19–32)
Chloride: 106 mEq/L (ref 96–112)
Creatinine, Ser: 0.7 mg/dL (ref 0.4–1.2)
GFR: 85.85 mL/min (ref >60.00)
Glucose, Bld: 104 mg/dL — ABNORMAL HIGH (ref 70–99)
POTASSIUM: 4.6 meq/L (ref 3.5–5.1)
SODIUM: 138 meq/L (ref 135–145)

## 2014-12-19 LAB — HEPATIC FUNCTION PANEL
ALT: 18 U/L (ref 0–35)
AST: 16 U/L (ref 0–37)
Albumin: 3.7 g/dL (ref 3.5–5.2)
Alkaline Phosphatase: 79 U/L (ref 39–117)
BILIRUBIN TOTAL: 0.5 mg/dL (ref 0.2–1.2)
Bilirubin, Direct: 0 mg/dL (ref 0.0–0.3)
Total Protein: 6.9 g/dL (ref 6.0–8.3)

## 2014-12-20 DIAGNOSIS — Q041 Arhinencephaly: Secondary | ICD-10-CM | POA: Diagnosis not present

## 2014-12-21 DIAGNOSIS — Q041 Arhinencephaly: Secondary | ICD-10-CM | POA: Diagnosis not present

## 2014-12-22 DIAGNOSIS — Q041 Arhinencephaly: Secondary | ICD-10-CM | POA: Diagnosis not present

## 2014-12-25 DIAGNOSIS — Q041 Arhinencephaly: Secondary | ICD-10-CM | POA: Diagnosis not present

## 2014-12-26 ENCOUNTER — Ambulatory Visit (INDEPENDENT_AMBULATORY_CARE_PROVIDER_SITE_OTHER): Payer: Medicare Other | Admitting: Internal Medicine

## 2014-12-26 VITALS — BP 138/80 | HR 58 | Temp 97.4°F | Wt 144.0 lb

## 2014-12-26 DIAGNOSIS — K219 Gastro-esophageal reflux disease without esophagitis: Secondary | ICD-10-CM | POA: Diagnosis not present

## 2014-12-26 DIAGNOSIS — M5442 Lumbago with sciatica, left side: Secondary | ICD-10-CM

## 2014-12-26 DIAGNOSIS — G43019 Migraine without aura, intractable, without status migrainosus: Secondary | ICD-10-CM | POA: Diagnosis not present

## 2014-12-26 DIAGNOSIS — I10 Essential (primary) hypertension: Secondary | ICD-10-CM | POA: Diagnosis not present

## 2014-12-26 DIAGNOSIS — R7989 Other specified abnormal findings of blood chemistry: Secondary | ICD-10-CM

## 2014-12-26 DIAGNOSIS — R0789 Other chest pain: Secondary | ICD-10-CM | POA: Diagnosis not present

## 2014-12-26 DIAGNOSIS — G43719 Chronic migraine without aura, intractable, without status migrainosus: Secondary | ICD-10-CM | POA: Diagnosis not present

## 2014-12-26 DIAGNOSIS — Q041 Arhinencephaly: Secondary | ICD-10-CM | POA: Diagnosis not present

## 2014-12-26 NOTE — Progress Notes (Signed)
Pre visit review using our clinic review tool, if applicable. No additional management support is needed unless otherwise documented below in the visit note. 

## 2014-12-26 NOTE — Progress Notes (Signed)
Subjective:    HPI  The patient is here to follow up on chronic depression, anxiety, HTN and chronic moderate OA/LBP symptoms controlled with medicines, diet and exercise.  Lipitor caused arthralgias - taking Zetia only F/u issues with teeth - all teeth are extracted - getting implants done F/u LBP and L hip pain x weeks - better now, off and on C/o L CP at times x 1 month  Wt Readings from Last 3 Encounters:  12/26/14 144 lb (65.318 kg)  08/29/14 141 lb (63.957 kg)  04/13/14 142 lb (64.411 kg)   BP Readings from Last 3 Encounters:  12/26/14 138/80  08/29/14 140/80  04/13/14 110/62   F/u uterine prolapse and pelvic pains  Review of Systems  Constitutional: Negative.  Negative for fever, chills, diaphoresis, activity change, appetite change, fatigue and unexpected weight change.  HENT: Negative for congestion, ear pain, facial swelling, hearing loss, mouth sores, nosebleeds, postnasal drip, rhinorrhea, sinus pressure, sneezing, sore throat, tinnitus and trouble swallowing.   Eyes: Negative for pain, discharge, redness, itching and visual disturbance.  Respiratory: Negative for cough, chest tightness, shortness of breath, wheezing and stridor.   Cardiovascular: Negative for chest pain, palpitations and leg swelling.  Gastrointestinal: Negative for nausea, diarrhea, constipation, blood in stool, abdominal distention, anal bleeding and rectal pain.  Genitourinary: Positive for urgency and pelvic pain. Negative for dysuria, frequency, hematuria, flank pain, vaginal bleeding, vaginal discharge, difficulty urinating and genital sores.  Musculoskeletal: Positive for arthralgias. Negative for back pain, joint swelling, gait problem, neck pain and neck stiffness.  Skin: Negative.  Negative for rash.  Neurological: Negative for dizziness, tremors, seizures, syncope, speech difficulty, weakness, numbness and headaches.  Hematological: Negative for adenopathy. Does not bruise/bleed easily.   Psychiatric/Behavioral: Negative for suicidal ideas, behavioral problems, sleep disturbance, dysphoric mood and decreased concentration. The patient is nervous/anxious.        Objective:   Physical Exam  Constitutional: She appears well-developed. No distress.  HENT:  Head: Normocephalic.  Right Ear: External ear normal.  Left Ear: External ear normal.  Nose: Nose normal.  Mouth/Throat: Oropharynx is clear and moist.  Eyes: Conjunctivae are normal. Pupils are equal, round, and reactive to light. Right eye exhibits no discharge. Left eye exhibits no discharge.  Neck: Normal range of motion. Neck supple. No JVD present. No tracheal deviation present. No thyromegaly present.  Cardiovascular: Normal rate, regular rhythm and normal heart sounds.   Pulmonary/Chest: No stridor. No respiratory distress. She has no wheezes.  Abdominal: Soft. Bowel sounds are normal. She exhibits no distension and no mass. There is no tenderness. There is no rebound and no guarding.  Musculoskeletal: She exhibits no edema or tenderness.  Lymphadenopathy:    She has no cervical adenopathy.  Neurological: She displays normal reflexes. No cranial nerve deficit. She exhibits normal muscle tone. Coordination normal.  Skin: No rash noted. No erythema.  Psychiatric: She has a normal mood and affect. Her behavior is normal. Judgment and thought content normal.  L hip is NT  Lab Results  Component Value Date   WBC 5.2 07/28/2014   HGB 11.5* 07/28/2014   HCT 34.5* 07/28/2014   PLT 206.0 07/28/2014   GLUCOSE 104* 12/19/2014   CHOL 201* 12/19/2014   TRIG 67.0 12/19/2014   HDL 55.90 12/19/2014   LDLDIRECT 165.2 05/25/2013   LDLCALC 132* 12/19/2014   ALT 18 12/19/2014   AST 16 12/19/2014   NA 138 12/19/2014   K 4.6 12/19/2014   CL 106 12/19/2014  CREATININE 0.7 12/19/2014   BUN 18 12/19/2014   CO2 28 12/19/2014   TSH 1.90 04/20/2012   HGBA1C 5.9 09/29/2007         Assessment & Plan:  Patient ID:  Sophia Santos, female   DOB: 12/11/1934, 81 y.o.   MRN: [id number]

## 2014-12-26 NOTE — Assessment & Plan Note (Signed)
Continue with current prescription therapy as reflected on the Med list.  

## 2014-12-26 NOTE — Patient Instructions (Signed)
Sciatica Sciatica is pain, weakness, numbness, or tingling along the path of the sciatic nerve. The nerve starts in the lower back and runs down the back of each leg. The nerve controls the muscles in the lower leg and in the back of the knee, while also providing sensation to the back of the thigh, lower leg, and the sole of your foot. Sciatica is a symptom of another medical condition. For instance, nerve damage or certain conditions, such as a herniated disk or bone spur on the spine, pinch or put pressure on the sciatic nerve. This causes the pain, weakness, or other sensations normally associated with sciatica. Generally, sciatica only affects one side of the body. CAUSES   Herniated or slipped disc.  Degenerative disk disease.  A pain disorder involving the narrow muscle in the buttocks (piriformis syndrome).  Pelvic injury or fracture.  Pregnancy.  Tumor (rare). SYMPTOMS  Symptoms can vary from mild to very severe. The symptoms usually travel from the low back to the buttocks and down the back of the leg. Symptoms can include:  Mild tingling or dull aches in the lower back, leg, or hip.  Numbness in the back of the calf or sole of the foot.  Burning sensations in the lower back, leg, or hip.  Sharp pains in the lower back, leg, or hip.  Leg weakness.  Severe back pain inhibiting movement. These symptoms may get worse with coughing, sneezing, laughing, or prolonged sitting or standing. Also, being overweight may worsen symptoms. DIAGNOSIS  Your caregiver will perform a physical exam to look for common symptoms of sciatica. He or she may ask you to do certain movements or activities that would trigger sciatic nerve pain. Other tests may be performed to find the cause of the sciatica. These may include:  Blood tests.  X-rays.  Imaging tests, such as an MRI or CT scan. TREATMENT  Treatment is directed at the cause of the sciatic pain. Sometimes, treatment is not necessary  and the pain and discomfort goes away on its own. If treatment is needed, your caregiver may suggest:  Over-the-counter medicines to relieve pain.  Prescription medicines, such as anti-inflammatory medicine, muscle relaxants, or narcotics.  Applying heat or ice to the painful area.  Steroid injections to lessen pain, irritation, and inflammation around the nerve.  Reducing activity during periods of pain.  Exercising and stretching to strengthen your abdomen and improve flexibility of your spine. Your caregiver may suggest losing weight if the extra weight makes the back pain worse.  Physical therapy.  Surgery to eliminate what is pressing or pinching the nerve, such as a bone spur or part of a herniated disk. HOME CARE INSTRUCTIONS   Only take over-the-counter or prescription medicines for pain or discomfort as directed by your caregiver.  Apply ice to the affected area for 20 minutes, 3-4 times a day for the first 48-72 hours. Then try heat in the same way.  Exercise, stretch, or perform your usual activities if these do not aggravate your pain.  Attend physical therapy sessions as directed by your caregiver.  Keep all follow-up appointments as directed by your caregiver.  Do not wear high heels or shoes that do not provide proper support.  Check your mattress to see if it is too soft. A firm mattress may lessen your pain and discomfort. SEEK IMMEDIATE MEDICAL CARE IF:   You lose control of your bowel or bladder (incontinence).  You have increasing weakness in the lower back, pelvis, buttocks,   or legs.  You have redness or swelling of your back.  You have a burning sensation when you urinate.  You have pain that gets worse when you lie down or awakens you at night.  Your pain is worse than you have experienced in the past.  Your pain is lasting longer than 4 weeks.  You are suddenly losing weight without reason. MAKE SURE YOU:  Understand these  instructions.  Will watch your condition.  Will get help right away if you are not doing well or get worse. Document Released: 11/25/2001 Document Revised: 06/01/2012 Document Reviewed: 04/11/2012 [employer] Patient Information 2015 [employer]. This information is not intended to replace advice given to you by your health care provider. Make sure you discuss any questions you have with your health care provider.  

## 2014-12-26 NOTE — Assessment & Plan Note (Signed)
EKG Pt refused a stress test today

## 2014-12-26 NOTE — Assessment & Plan Note (Signed)
Rare re-occurance -?L sciatica Stretch

## 2014-12-26 NOTE — Assessment & Plan Note (Signed)
Monitor CBC q 6-12 mo

## 2014-12-27 DIAGNOSIS — Q041 Arhinencephaly: Secondary | ICD-10-CM | POA: Diagnosis not present

## 2014-12-28 DIAGNOSIS — Q041 Arhinencephaly: Secondary | ICD-10-CM | POA: Diagnosis not present

## 2014-12-29 DIAGNOSIS — Q041 Arhinencephaly: Secondary | ICD-10-CM | POA: Diagnosis not present

## 2015-01-01 DIAGNOSIS — Q041 Arhinencephaly: Secondary | ICD-10-CM | POA: Diagnosis not present

## 2015-01-01 DIAGNOSIS — L570 Actinic keratosis: Secondary | ICD-10-CM | POA: Diagnosis not present

## 2015-01-02 DIAGNOSIS — Q041 Arhinencephaly: Secondary | ICD-10-CM | POA: Diagnosis not present

## 2015-01-03 DIAGNOSIS — Q041 Arhinencephaly: Secondary | ICD-10-CM | POA: Diagnosis not present

## 2015-01-04 DIAGNOSIS — Q041 Arhinencephaly: Secondary | ICD-10-CM | POA: Diagnosis not present

## 2015-01-05 DIAGNOSIS — Q041 Arhinencephaly: Secondary | ICD-10-CM | POA: Diagnosis not present

## 2015-01-08 DIAGNOSIS — Q041 Arhinencephaly: Secondary | ICD-10-CM | POA: Diagnosis not present

## 2015-01-09 DIAGNOSIS — Q041 Arhinencephaly: Secondary | ICD-10-CM | POA: Diagnosis not present

## 2015-01-10 DIAGNOSIS — Q041 Arhinencephaly: Secondary | ICD-10-CM | POA: Diagnosis not present

## 2015-01-10 DIAGNOSIS — H16223 Keratoconjunctivitis sicca, not specified as Sjogren's, bilateral: Secondary | ICD-10-CM | POA: Diagnosis not present

## 2015-01-11 DIAGNOSIS — Q041 Arhinencephaly: Secondary | ICD-10-CM | POA: Diagnosis not present

## 2015-01-12 DIAGNOSIS — Q041 Arhinencephaly: Secondary | ICD-10-CM | POA: Diagnosis not present

## 2015-01-15 DIAGNOSIS — Q041 Arhinencephaly: Secondary | ICD-10-CM | POA: Diagnosis not present

## 2015-01-16 DIAGNOSIS — Q041 Arhinencephaly: Secondary | ICD-10-CM | POA: Diagnosis not present

## 2015-01-17 DIAGNOSIS — Q041 Arhinencephaly: Secondary | ICD-10-CM | POA: Diagnosis not present

## 2015-01-18 DIAGNOSIS — Q041 Arhinencephaly: Secondary | ICD-10-CM | POA: Diagnosis not present

## 2015-01-19 DIAGNOSIS — Q041 Arhinencephaly: Secondary | ICD-10-CM | POA: Diagnosis not present

## 2015-01-22 DIAGNOSIS — Q041 Arhinencephaly: Secondary | ICD-10-CM | POA: Diagnosis not present

## 2015-01-23 DIAGNOSIS — Q041 Arhinencephaly: Secondary | ICD-10-CM | POA: Diagnosis not present

## 2015-01-24 DIAGNOSIS — Q041 Arhinencephaly: Secondary | ICD-10-CM | POA: Diagnosis not present

## 2015-01-25 DIAGNOSIS — Q041 Arhinencephaly: Secondary | ICD-10-CM | POA: Diagnosis not present

## 2015-01-26 DIAGNOSIS — Q041 Arhinencephaly: Secondary | ICD-10-CM | POA: Diagnosis not present

## 2015-01-29 DIAGNOSIS — Q041 Arhinencephaly: Secondary | ICD-10-CM | POA: Diagnosis not present

## 2015-01-30 DIAGNOSIS — Q041 Arhinencephaly: Secondary | ICD-10-CM | POA: Diagnosis not present

## 2015-01-31 DIAGNOSIS — Q041 Arhinencephaly: Secondary | ICD-10-CM | POA: Diagnosis not present

## 2015-02-01 DIAGNOSIS — Q041 Arhinencephaly: Secondary | ICD-10-CM | POA: Diagnosis not present

## 2015-02-02 DIAGNOSIS — Q041 Arhinencephaly: Secondary | ICD-10-CM | POA: Diagnosis not present

## 2015-02-02 DIAGNOSIS — H1013 Acute atopic conjunctivitis, bilateral: Secondary | ICD-10-CM | POA: Diagnosis not present

## 2015-02-05 DIAGNOSIS — Q041 Arhinencephaly: Secondary | ICD-10-CM | POA: Diagnosis not present

## 2015-02-06 DIAGNOSIS — Q041 Arhinencephaly: Secondary | ICD-10-CM | POA: Diagnosis not present

## 2015-02-07 DIAGNOSIS — Q041 Arhinencephaly: Secondary | ICD-10-CM | POA: Diagnosis not present

## 2015-02-08 DIAGNOSIS — Q041 Arhinencephaly: Secondary | ICD-10-CM | POA: Diagnosis not present

## 2015-02-09 DIAGNOSIS — Q041 Arhinencephaly: Secondary | ICD-10-CM | POA: Diagnosis not present

## 2015-02-12 DIAGNOSIS — Q041 Arhinencephaly: Secondary | ICD-10-CM | POA: Diagnosis not present

## 2015-02-13 DIAGNOSIS — Q041 Arhinencephaly: Secondary | ICD-10-CM | POA: Diagnosis not present

## 2015-02-15 DIAGNOSIS — Q041 Arhinencephaly: Secondary | ICD-10-CM | POA: Diagnosis not present

## 2015-02-16 DIAGNOSIS — Q041 Arhinencephaly: Secondary | ICD-10-CM | POA: Diagnosis not present

## 2015-02-25 ENCOUNTER — Emergency Department (HOSPITAL_COMMUNITY)
Admission: EM | Admit: 2015-02-25 | Discharge: 2015-02-25 | Discharge status: Against Medical Advice | Payer: Medicare Other | Attending: Emergency Medicine | Admitting: Emergency Medicine

## 2015-02-25 ENCOUNTER — Encounter (HOSPITAL_COMMUNITY): Payer: Self-pay | Admitting: *Deleted

## 2015-02-25 DIAGNOSIS — R109 Unspecified abdominal pain: Secondary | ICD-10-CM | POA: Insufficient documentation

## 2015-02-25 DIAGNOSIS — I10 Essential (primary) hypertension: Secondary | ICD-10-CM | POA: Insufficient documentation

## 2015-02-25 DIAGNOSIS — R112 Nausea with vomiting, unspecified: Secondary | ICD-10-CM | POA: Diagnosis not present

## 2015-02-25 DIAGNOSIS — R197 Diarrhea, unspecified: Secondary | ICD-10-CM | POA: Diagnosis not present

## 2015-02-25 NOTE — ED Notes (Signed)
Pt told registration that she felt better and no longer wanted to be seen.

## 2015-02-25 NOTE — ED Notes (Signed)
Pt complains of abdominal pain, nausea, emesis since 3AM today. Pt's son states the pt may have had some blood in her stool as well.

## 2015-02-26 DIAGNOSIS — Q041 Arhinencephaly: Secondary | ICD-10-CM | POA: Diagnosis not present

## 2015-02-27 DIAGNOSIS — Q041 Arhinencephaly: Secondary | ICD-10-CM | POA: Diagnosis not present

## 2015-02-28 DIAGNOSIS — Q041 Arhinencephaly: Secondary | ICD-10-CM | POA: Diagnosis not present

## 2015-03-01 DIAGNOSIS — Q041 Arhinencephaly: Secondary | ICD-10-CM | POA: Diagnosis not present

## 2015-03-02 DIAGNOSIS — Q041 Arhinencephaly: Secondary | ICD-10-CM | POA: Diagnosis not present

## 2015-03-02 DIAGNOSIS — H16223 Keratoconjunctivitis sicca, not specified as Sjogren's, bilateral: Secondary | ICD-10-CM | POA: Diagnosis not present

## 2015-03-05 DIAGNOSIS — Q041 Arhinencephaly: Secondary | ICD-10-CM | POA: Diagnosis not present

## 2015-03-06 DIAGNOSIS — Q041 Arhinencephaly: Secondary | ICD-10-CM | POA: Diagnosis not present

## 2015-03-07 DIAGNOSIS — Q041 Arhinencephaly: Secondary | ICD-10-CM | POA: Diagnosis not present

## 2015-03-08 DIAGNOSIS — Q041 Arhinencephaly: Secondary | ICD-10-CM | POA: Diagnosis not present

## 2015-03-09 DIAGNOSIS — Q041 Arhinencephaly: Secondary | ICD-10-CM | POA: Diagnosis not present

## 2015-03-12 DIAGNOSIS — Q041 Arhinencephaly: Secondary | ICD-10-CM | POA: Diagnosis not present

## 2015-03-13 DIAGNOSIS — Q041 Arhinencephaly: Secondary | ICD-10-CM | POA: Diagnosis not present

## 2015-03-14 DIAGNOSIS — Q041 Arhinencephaly: Secondary | ICD-10-CM | POA: Diagnosis not present

## 2015-03-15 DIAGNOSIS — Q041 Arhinencephaly: Secondary | ICD-10-CM | POA: Diagnosis not present

## 2015-03-16 DIAGNOSIS — Q041 Arhinencephaly: Secondary | ICD-10-CM | POA: Diagnosis not present

## 2015-03-19 DIAGNOSIS — Q041 Arhinencephaly: Secondary | ICD-10-CM | POA: Diagnosis not present

## 2015-03-20 DIAGNOSIS — Q041 Arhinencephaly: Secondary | ICD-10-CM | POA: Diagnosis not present

## 2015-03-21 DIAGNOSIS — Q041 Arhinencephaly: Secondary | ICD-10-CM | POA: Diagnosis not present

## 2015-03-22 DIAGNOSIS — D485 Neoplasm of uncertain behavior of skin: Secondary | ICD-10-CM | POA: Diagnosis not present

## 2015-03-22 DIAGNOSIS — Q041 Arhinencephaly: Secondary | ICD-10-CM | POA: Diagnosis not present

## 2015-03-22 DIAGNOSIS — L570 Actinic keratosis: Secondary | ICD-10-CM | POA: Diagnosis not present

## 2015-03-23 DIAGNOSIS — Q041 Arhinencephaly: Secondary | ICD-10-CM | POA: Diagnosis not present

## 2015-03-26 DIAGNOSIS — Q041 Arhinencephaly: Secondary | ICD-10-CM | POA: Diagnosis not present

## 2015-03-27 DIAGNOSIS — Q041 Arhinencephaly: Secondary | ICD-10-CM | POA: Diagnosis not present

## 2015-03-28 DIAGNOSIS — Q041 Arhinencephaly: Secondary | ICD-10-CM | POA: Diagnosis not present

## 2015-03-29 DIAGNOSIS — Q041 Arhinencephaly: Secondary | ICD-10-CM | POA: Diagnosis not present

## 2015-03-30 DIAGNOSIS — Q041 Arhinencephaly: Secondary | ICD-10-CM | POA: Diagnosis not present

## 2015-04-02 ENCOUNTER — Encounter: Payer: Self-pay | Admitting: Internal Medicine

## 2015-04-02 ENCOUNTER — Ambulatory Visit (INDEPENDENT_AMBULATORY_CARE_PROVIDER_SITE_OTHER): Payer: Medicare Other | Admitting: Internal Medicine

## 2015-04-02 VITALS — BP 120/82 | HR 60 | Wt 141.0 lb

## 2015-04-02 DIAGNOSIS — R1013 Epigastric pain: Secondary | ICD-10-CM

## 2015-04-02 DIAGNOSIS — M25552 Pain in left hip: Secondary | ICD-10-CM | POA: Diagnosis not present

## 2015-04-02 DIAGNOSIS — E785 Hyperlipidemia, unspecified: Secondary | ICD-10-CM

## 2015-04-02 DIAGNOSIS — G4700 Insomnia, unspecified: Secondary | ICD-10-CM | POA: Diagnosis not present

## 2015-04-02 DIAGNOSIS — Q041 Arhinencephaly: Secondary | ICD-10-CM | POA: Diagnosis not present

## 2015-04-02 DIAGNOSIS — F411 Generalized anxiety disorder: Secondary | ICD-10-CM | POA: Diagnosis not present

## 2015-04-02 DIAGNOSIS — K30 Functional dyspepsia: Secondary | ICD-10-CM | POA: Insufficient documentation

## 2015-04-02 MED ORDER — PANCRELIPASE (LIP-PROT-AMYL) 24000-76000 UNITS PO CPEP: ORAL_CAPSULE | ORAL | Status: DC

## 2015-04-02 MED ORDER — OMEPRAZOLE 20 MG PO CPDR: 40.0000 mg | DELAYED_RELEASE_CAPSULE | Freq: Every day | ORAL | Status: DC

## 2015-04-02 MED ORDER — TRIAZOLAM 0.25 MG PO TABS: 0.2500 mg | ORAL_TABLET | Freq: Every evening | ORAL | Status: DC | PRN

## 2015-04-02 MED ORDER — CAPTOPRIL 100 MG PO TABS: 100.0000 mg | ORAL_TABLET | Freq: Three times a day (TID) | ORAL | Status: DC

## 2015-04-02 MED ORDER — PROPRANOLOL HCL ER 120 MG PO CP24: 120.0000 mg | ORAL_CAPSULE | ORAL | Status: DC

## 2015-04-02 MED ORDER — EZETIMIBE 10 MG PO TABS: 10.0000 mg | ORAL_TABLET | Freq: Every day | ORAL | Status: DC

## 2015-04-02 NOTE — Assessment & Plan Note (Signed)
Cont Zetia

## 2015-04-02 NOTE — Assessment & Plan Note (Signed)
Will try Creon  

## 2015-04-02 NOTE — Progress Notes (Signed)
Pre visit review using our clinic review tool, if applicable. No additional management support is needed unless otherwise documented below in the visit note. 

## 2015-04-02 NOTE — Assessment & Plan Note (Signed)
D/c Xanax Triazolam at hs prn

## 2015-04-02 NOTE — Assessment & Plan Note (Signed)
Doing better.   

## 2015-04-02 NOTE — Progress Notes (Signed)
Subjective:    HPI  The patient is here to follow up on chronic depression, anxiety, HTN and chronic moderate OA/LBP symptoms controlled with medicines, diet and exercise.  Lipitor caused arthralgias - taking Zetia only F/u issues with teeth - all teeth are extracted - getting implants done F/u LBP and L hip pain x weeks - better now, off and on C/o chronic bloating w/food - enzymes helped in the past...   Wt Readings from Last 3 Encounters:  04/02/15 141 lb (63.957 kg)  12/26/14 144 lb (65.318 kg)  08/29/14 141 lb (63.957 kg)   BP Readings from Last 3 Encounters:  04/02/15 120/82  12/26/14 138/80  08/29/14 140/80   F/u uterine prolapse and pelvic pains  Review of Systems  Constitutional: Negative.  Negative for fever, chills, diaphoresis, activity change, appetite change, fatigue and unexpected weight change.  HENT: Negative for congestion, ear pain, facial swelling, hearing loss, mouth sores, nosebleeds, postnasal drip, rhinorrhea, sinus pressure, sneezing, sore throat, tinnitus and trouble swallowing.   Eyes: Negative for pain, discharge, redness, itching and visual disturbance.  Respiratory: Negative for cough, chest tightness, shortness of breath, wheezing and stridor.   Cardiovascular: Negative for chest pain, palpitations and leg swelling.  Gastrointestinal: Negative for nausea, diarrhea, constipation, blood in stool, abdominal distention, anal bleeding and rectal pain.  Genitourinary: Positive for urgency and pelvic pain. Negative for dysuria, frequency, hematuria, flank pain, vaginal bleeding, vaginal discharge, difficulty urinating and genital sores.  Musculoskeletal: Positive for arthralgias. Negative for back pain, joint swelling, gait problem, neck pain and neck stiffness.  Skin: Negative.  Negative for rash.  Neurological: Negative for dizziness, tremors, seizures, syncope, speech difficulty, weakness, numbness and headaches.  Hematological: Negative for  adenopathy. Does not bruise/bleed easily.  Psychiatric/Behavioral: Negative for suicidal ideas, behavioral problems, sleep disturbance, dysphoric mood and decreased concentration. The patient is nervous/anxious.        Objective:   Physical Exam  Constitutional: She appears well-developed. No distress.  HENT:  Head: Normocephalic.  Right Ear: External ear normal.  Left Ear: External ear normal.  Nose: Nose normal.  Mouth/Throat: Oropharynx is clear and moist.  Eyes: Conjunctivae are normal. Pupils are equal, round, and reactive to light. Right eye exhibits no discharge. Left eye exhibits no discharge.  Neck: Normal range of motion. Neck supple. No JVD present. No tracheal deviation present. No thyromegaly present.  Cardiovascular: Normal rate, regular rhythm and normal heart sounds.   Pulmonary/Chest: No stridor. No respiratory distress. She has no wheezes.  Abdominal: Soft. Bowel sounds are normal. She exhibits no distension and no mass. There is no tenderness. There is no rebound and no guarding.  Musculoskeletal: She exhibits no edema or tenderness.  Lymphadenopathy:    She has no cervical adenopathy.  Neurological: She displays normal reflexes. No cranial nerve deficit. She exhibits normal muscle tone. Coordination normal.  Skin: No rash noted. No erythema.  Psychiatric: She has a normal mood and affect. Her behavior is normal. Judgment and thought content normal.    Lab Results  Component Value Date   WBC 5.2 07/28/2014   HGB 11.5* 07/28/2014   HCT 34.5* 07/28/2014   PLT 206.0 07/28/2014   GLUCOSE 104* 12/19/2014   CHOL 201* 12/19/2014   TRIG 67.0 12/19/2014   HDL 55.90 12/19/2014   LDLDIRECT 165.2 05/25/2013   LDLCALC 132* 12/19/2014   ALT 18 12/19/2014   AST 16 12/19/2014   NA 138 12/19/2014   K 4.6 12/19/2014   CL 106 12/19/2014  CREATININE 0.7 12/19/2014   BUN 18 12/19/2014   CO2 28 12/19/2014   TSH 1.90 04/20/2012   HGBA1C 5.9 09/29/2007          Assessment & Plan:  [name], female   DOB: November 15, 1934, 81 y.o.   MRN: [id number]

## 2015-04-03 ENCOUNTER — Telehealth: Payer: Self-pay

## 2015-04-03 ENCOUNTER — Telehealth: Payer: Self-pay | Admitting: Internal Medicine

## 2015-04-03 DIAGNOSIS — Q041 Arhinencephaly: Secondary | ICD-10-CM | POA: Diagnosis not present

## 2015-04-03 NOTE — Telephone Encounter (Signed)
Patient has been getting 80 hours a month for 5 days a week home care. They have reduced her ours to only 53 per month. Sophia Santos is going to an appeal tomorrow in hopes to get her hours restored. She was hoping the Dr. Could maybe type something up for her to help her with this appeal. She said that in order to get this they need to be elaborate on the ADL's. Could you please call Sophia Santos

## 2015-04-03 NOTE — Telephone Encounter (Signed)
Per dr Alain Marion, patient is still driving and he cant justify a letter to increase hours for ADLs at this time

## 2015-04-03 NOTE — Telephone Encounter (Signed)
Please advise, thanks.

## 2015-04-03 NOTE — Telephone Encounter (Signed)
Pt is stable Thx

## 2015-04-04 DIAGNOSIS — Q041 Arhinencephaly: Secondary | ICD-10-CM | POA: Diagnosis not present

## 2015-04-05 DIAGNOSIS — Q041 Arhinencephaly: Secondary | ICD-10-CM | POA: Diagnosis not present

## 2015-04-06 DIAGNOSIS — Q041 Arhinencephaly: Secondary | ICD-10-CM | POA: Diagnosis not present

## 2015-04-09 DIAGNOSIS — Q041 Arhinencephaly: Secondary | ICD-10-CM | POA: Diagnosis not present

## 2015-04-09 DIAGNOSIS — N814 Uterovaginal prolapse, unspecified: Secondary | ICD-10-CM | POA: Diagnosis not present

## 2015-04-10 DIAGNOSIS — Q041 Arhinencephaly: Secondary | ICD-10-CM | POA: Diagnosis not present

## 2015-04-11 DIAGNOSIS — Q041 Arhinencephaly: Secondary | ICD-10-CM | POA: Diagnosis not present

## 2015-04-12 DIAGNOSIS — Q041 Arhinencephaly: Secondary | ICD-10-CM | POA: Diagnosis not present

## 2015-04-13 DIAGNOSIS — Q041 Arhinencephaly: Secondary | ICD-10-CM | POA: Diagnosis not present

## 2015-04-16 DIAGNOSIS — Q041 Arhinencephaly: Secondary | ICD-10-CM | POA: Diagnosis not present

## 2015-04-17 DIAGNOSIS — Q041 Arhinencephaly: Secondary | ICD-10-CM | POA: Diagnosis not present

## 2015-04-18 DIAGNOSIS — Q041 Arhinencephaly: Secondary | ICD-10-CM | POA: Diagnosis not present

## 2015-04-19 DIAGNOSIS — Q041 Arhinencephaly: Secondary | ICD-10-CM | POA: Diagnosis not present

## 2015-04-20 DIAGNOSIS — Q041 Arhinencephaly: Secondary | ICD-10-CM | POA: Diagnosis not present

## 2015-04-23 DIAGNOSIS — Q041 Arhinencephaly: Secondary | ICD-10-CM | POA: Diagnosis not present

## 2015-04-23 DIAGNOSIS — L570 Actinic keratosis: Secondary | ICD-10-CM | POA: Diagnosis not present

## 2015-04-24 DIAGNOSIS — Q041 Arhinencephaly: Secondary | ICD-10-CM | POA: Diagnosis not present

## 2015-04-25 DIAGNOSIS — Q041 Arhinencephaly: Secondary | ICD-10-CM | POA: Diagnosis not present

## 2015-04-26 DIAGNOSIS — Q041 Arhinencephaly: Secondary | ICD-10-CM | POA: Diagnosis not present

## 2015-04-27 DIAGNOSIS — Q041 Arhinencephaly: Secondary | ICD-10-CM | POA: Diagnosis not present

## 2015-04-30 DIAGNOSIS — Q041 Arhinencephaly: Secondary | ICD-10-CM | POA: Diagnosis not present

## 2015-05-01 DIAGNOSIS — Q041 Arhinencephaly: Secondary | ICD-10-CM | POA: Diagnosis not present

## 2015-05-02 DIAGNOSIS — Q041 Arhinencephaly: Secondary | ICD-10-CM | POA: Diagnosis not present

## 2015-05-03 DIAGNOSIS — Q041 Arhinencephaly: Secondary | ICD-10-CM | POA: Diagnosis not present

## 2015-05-04 DIAGNOSIS — Q041 Arhinencephaly: Secondary | ICD-10-CM | POA: Diagnosis not present

## 2015-05-07 ENCOUNTER — Encounter: Payer: Self-pay | Admitting: Gastroenterology

## 2015-05-07 DIAGNOSIS — Q041 Arhinencephaly: Secondary | ICD-10-CM | POA: Diagnosis not present

## 2015-05-08 DIAGNOSIS — H16223 Keratoconjunctivitis sicca, not specified as Sjogren's, bilateral: Secondary | ICD-10-CM | POA: Diagnosis not present

## 2015-05-08 DIAGNOSIS — Q041 Arhinencephaly: Secondary | ICD-10-CM | POA: Diagnosis not present

## 2015-05-09 DIAGNOSIS — Q041 Arhinencephaly: Secondary | ICD-10-CM | POA: Diagnosis not present

## 2015-05-10 DIAGNOSIS — Q041 Arhinencephaly: Secondary | ICD-10-CM | POA: Diagnosis not present

## 2015-05-11 DIAGNOSIS — Q041 Arhinencephaly: Secondary | ICD-10-CM | POA: Diagnosis not present

## 2015-05-14 DIAGNOSIS — Q041 Arhinencephaly: Secondary | ICD-10-CM | POA: Diagnosis not present

## 2015-05-15 DIAGNOSIS — Q041 Arhinencephaly: Secondary | ICD-10-CM | POA: Diagnosis not present

## 2015-05-16 DIAGNOSIS — Q041 Arhinencephaly: Secondary | ICD-10-CM | POA: Diagnosis not present

## 2015-05-17 DIAGNOSIS — Q041 Arhinencephaly: Secondary | ICD-10-CM | POA: Diagnosis not present

## 2015-05-18 DIAGNOSIS — Q041 Arhinencephaly: Secondary | ICD-10-CM | POA: Diagnosis not present

## 2015-05-21 DIAGNOSIS — Q041 Arhinencephaly: Secondary | ICD-10-CM | POA: Diagnosis not present

## 2015-05-22 DIAGNOSIS — Q041 Arhinencephaly: Secondary | ICD-10-CM | POA: Diagnosis not present

## 2015-05-23 DIAGNOSIS — Q041 Arhinencephaly: Secondary | ICD-10-CM | POA: Diagnosis not present

## 2015-05-24 DIAGNOSIS — Q041 Arhinencephaly: Secondary | ICD-10-CM | POA: Diagnosis not present

## 2015-05-25 DIAGNOSIS — Q041 Arhinencephaly: Secondary | ICD-10-CM | POA: Diagnosis not present

## 2015-05-28 DIAGNOSIS — Q041 Arhinencephaly: Secondary | ICD-10-CM | POA: Diagnosis not present

## 2015-05-29 DIAGNOSIS — Q041 Arhinencephaly: Secondary | ICD-10-CM | POA: Diagnosis not present

## 2015-05-30 DIAGNOSIS — Q041 Arhinencephaly: Secondary | ICD-10-CM | POA: Diagnosis not present

## 2015-05-31 DIAGNOSIS — Q041 Arhinencephaly: Secondary | ICD-10-CM | POA: Diagnosis not present

## 2015-06-01 DIAGNOSIS — Q041 Arhinencephaly: Secondary | ICD-10-CM | POA: Diagnosis not present

## 2015-06-04 DIAGNOSIS — Q041 Arhinencephaly: Secondary | ICD-10-CM | POA: Diagnosis not present

## 2015-06-05 DIAGNOSIS — Q041 Arhinencephaly: Secondary | ICD-10-CM | POA: Diagnosis not present

## 2015-06-06 DIAGNOSIS — Q041 Arhinencephaly: Secondary | ICD-10-CM | POA: Diagnosis not present

## 2015-06-07 DIAGNOSIS — Q041 Arhinencephaly: Secondary | ICD-10-CM | POA: Diagnosis not present

## 2015-06-08 DIAGNOSIS — Q041 Arhinencephaly: Secondary | ICD-10-CM | POA: Diagnosis not present

## 2015-06-11 DIAGNOSIS — Q041 Arhinencephaly: Secondary | ICD-10-CM | POA: Diagnosis not present

## 2015-06-12 DIAGNOSIS — Q041 Arhinencephaly: Secondary | ICD-10-CM | POA: Diagnosis not present

## 2015-06-13 DIAGNOSIS — Q041 Arhinencephaly: Secondary | ICD-10-CM | POA: Diagnosis not present

## 2015-06-14 DIAGNOSIS — Q041 Arhinencephaly: Secondary | ICD-10-CM | POA: Diagnosis not present

## 2015-06-15 DIAGNOSIS — Q041 Arhinencephaly: Secondary | ICD-10-CM | POA: Diagnosis not present

## 2015-06-18 DIAGNOSIS — Q041 Arhinencephaly: Secondary | ICD-10-CM | POA: Diagnosis not present

## 2015-06-19 DIAGNOSIS — Q041 Arhinencephaly: Secondary | ICD-10-CM | POA: Diagnosis not present

## 2015-06-20 DIAGNOSIS — Q041 Arhinencephaly: Secondary | ICD-10-CM | POA: Diagnosis not present

## 2015-06-21 DIAGNOSIS — Q041 Arhinencephaly: Secondary | ICD-10-CM | POA: Diagnosis not present

## 2015-06-22 DIAGNOSIS — G43719 Chronic migraine without aura, intractable, without status migrainosus: Secondary | ICD-10-CM | POA: Diagnosis not present

## 2015-06-22 DIAGNOSIS — Q041 Arhinencephaly: Secondary | ICD-10-CM | POA: Diagnosis not present

## 2015-06-22 DIAGNOSIS — G43019 Migraine without aura, intractable, without status migrainosus: Secondary | ICD-10-CM | POA: Diagnosis not present

## 2015-06-25 DIAGNOSIS — Q041 Arhinencephaly: Secondary | ICD-10-CM | POA: Diagnosis not present

## 2015-06-26 DIAGNOSIS — Q041 Arhinencephaly: Secondary | ICD-10-CM | POA: Diagnosis not present

## 2015-06-27 DIAGNOSIS — Q041 Arhinencephaly: Secondary | ICD-10-CM | POA: Diagnosis not present

## 2015-06-28 DIAGNOSIS — Q041 Arhinencephaly: Secondary | ICD-10-CM | POA: Diagnosis not present

## 2015-06-29 DIAGNOSIS — Q041 Arhinencephaly: Secondary | ICD-10-CM | POA: Diagnosis not present

## 2015-07-02 DIAGNOSIS — Q041 Arhinencephaly: Secondary | ICD-10-CM | POA: Diagnosis not present

## 2015-07-03 DIAGNOSIS — Q041 Arhinencephaly: Secondary | ICD-10-CM | POA: Diagnosis not present

## 2015-07-04 DIAGNOSIS — Q041 Arhinencephaly: Secondary | ICD-10-CM | POA: Diagnosis not present

## 2015-07-05 DIAGNOSIS — Q041 Arhinencephaly: Secondary | ICD-10-CM | POA: Diagnosis not present

## 2015-07-06 DIAGNOSIS — Q041 Arhinencephaly: Secondary | ICD-10-CM | POA: Diagnosis not present

## 2015-07-09 ENCOUNTER — Other Ambulatory Visit: Payer: Self-pay | Admitting: Internal Medicine

## 2015-07-09 DIAGNOSIS — Q041 Arhinencephaly: Secondary | ICD-10-CM | POA: Diagnosis not present

## 2015-07-10 DIAGNOSIS — Q041 Arhinencephaly: Secondary | ICD-10-CM | POA: Diagnosis not present

## 2015-07-11 ENCOUNTER — Encounter: Payer: Self-pay | Admitting: Gastroenterology

## 2015-07-11 ENCOUNTER — Other Ambulatory Visit: Payer: Self-pay

## 2015-07-11 ENCOUNTER — Ambulatory Visit (INDEPENDENT_AMBULATORY_CARE_PROVIDER_SITE_OTHER): Payer: Medicare Other | Admitting: Gastroenterology

## 2015-07-11 VITALS — BP 126/60 | HR 62 | Ht 63.0 in | Wt 148.2 lb

## 2015-07-11 DIAGNOSIS — Q041 Arhinencephaly: Secondary | ICD-10-CM | POA: Diagnosis not present

## 2015-07-11 DIAGNOSIS — Z800 Family history of malignant neoplasm of digestive organs: Secondary | ICD-10-CM

## 2015-07-11 DIAGNOSIS — Z1211 Encounter for screening for malignant neoplasm of colon: Secondary | ICD-10-CM

## 2015-07-11 MED ORDER — BISACODYL 5 MG PO TBEC: 5.0000 mg | DELAYED_RELEASE_TABLET | Freq: Every day | ORAL | Status: DC | PRN

## 2015-07-11 MED ORDER — POLYETHYLENE GLYCOL 3350 17 GM/SCOOP PO POWD: 1.0000 | Freq: Once | ORAL | Status: DC

## 2015-07-11 NOTE — Progress Notes (Signed)
HPI: This is a   very pleasant 81 year old 1 woman  Whom I am meeting for the first time today  Chief complaint is family history colon cancer  Mother had colon cancer  She has had no bowel issues.  She had a colonoscopy Dr. Verl Blalock June 2011. He described severe left-sided diverticulosis and a small, diminutive polyp was removed however it was not retrieved. He described that she had a poor prep. This colonoscopy was done for "history of polyps".  Colonoscopy Dr. Verl Blalock 2003 done for routine screening, he wrote that there were "no polyps!!!" (sic)  She lives by herself gets around fairly well, is able take care of all of her household chores and activities without assistance.   Review of systems: Pertinent positive and negative review of systems were noted in the above HPI section. Complete review of systems was performed and was otherwise normal.   Past Medical History  Diagnosis Date  . GERD (gastroesophageal reflux disease)   . HTN (hypertension)   . Hyperlipidemia   . Osteoarthritis   . Osteopenia   . Anxiety   . Arthritis   . Colon polyp   . Diverticulosis   . Irritable bowel syndrome (IBS)     History reviewed. No pertinent past surgical history.  Current Outpatient Prescriptions  Medication Sig Dispense Refill  . bisacodyl (DULCOLAX) 5 MG EC tablet Take 1-2 tablets (5-10 mg total) by mouth daily as needed. For constipation (Patient not taking: Reported on 04/02/2015) 60 tablet 11  . butalbital-acetaminophen-caffeine (FIORICET) 50-325-40 MG per tablet Take 1-2 tablets by mouth 2 (two) times daily as needed for headache. 90 tablet 0  . captopril (CAPOTEN) 100 MG tablet Take 1 tablet (100 mg total) by mouth 3 (three) times daily. 270 tablet 3  . cetirizine (ZYRTEC) 10 MG tablet Take 1 tablet (10 mg total) by mouth daily as needed for allergies. 30 tablet 5  . Cholecalciferol 1000 UNITS tablet Take 1,000 Units by mouth daily.      Marland Kitchen  conjugated estrogens (PREMARIN) vaginal cream Use pv qod x 1 week, then pv q1wk 42.5 g 12  . dipyridamole-aspirin (AGGRENOX) 200-25 MG per 12 hr capsule TAKE 1 CAPSULE BY MOUTH 2 (TWO) TIMES DAILY. 180 capsule 3  . ezetimibe (ZETIA) 10 MG tablet Take 1 tablet (10 mg total) by mouth daily. 90 tablet 3  . neomycin-polymyxin b-dexamethasone (MAXITROL) 3.5-10000-0.1 SUSP   0  . omeprazole (PRILOSEC) 20 MG capsule Take 2 capsules (40 mg total) by mouth daily. 180 capsule 3  . Pancrelipase, Lip-Prot-Amyl, 24000 UNITS CPEP 1 po w/food tid 90 capsule 3  . propranolol ER (INDERAL LA) 120 MG 24 hr capsule Take 1 capsule (120 mg total) by mouth every morning. 90 capsule 3  . RESTASIS 0.05 % ophthalmic emulsion     . topiramate (TOPAMAX) 50 MG tablet Take 50 mg by mouth daily.  1  . triazolam (HALCION) 0.25 MG tablet Take 1 tablet (0.25 mg total) by mouth at bedtime as needed for sleep. 30 tablet 5   No current facility-administered medications for this visit.    Allergies as of 07/11/2015 - Review Complete 07/11/2015  Allergen Reaction Noted  . Alendronate sodium  01/23/2009  . Lipitor [atorvastatin]  04/30/2012  . Simvastatin  11/15/2007  . Voltaren [diclofenac sodium]  07/25/2011    Family History  Problem Relation Age of Onset  . Coronary artery disease Other     female 1st degree <50  . Colon cancer Mother   .  Stroke Mother   . Parkinson's disease Father   . Dementia Father     History   Social History  . Marital Status: Widowed    Spouse Name: N/A  . Number of Children: 1  . Years of Education: N/A   Occupational History  . retired    Social History Main Topics  . Smoking status: Never Smoker   . Smokeless tobacco: Not on file  . Alcohol Use: No  . Drug Use: No  . Sexual Activity: No   Other Topics Concern  . Not on file   Social History Narrative   One Boy    Daily Caffeine - tea           Physical Exam: BP 126/60 mmHg  Pulse 62  Ht 5\' 3"  (1.6 m)  Wt 148 lb 3  oz (67.217 kg)  BMI 26.26 kg/m2 Constitutional: generally well-appearing Psychiatric: alert and oriented x3 Eyes: extraocular movements intact Mouth: oral pharynx moist, no lesions Neck: supple no lymphadenopathy Cardiovascular: heart regular rate and rhythm Lungs: clear to auscultation bilaterally Abdomen: soft, nontender, nondistended, no obvious ascites, no peritoneal signs, normal bowel sounds Extremities: no lower extremity edema bilaterally Skin: no lesions on visible extremities   Assessment and plan: 81 y.o. female with  family history colon cancer  She had a "poor prep "5 years ago and a single small polyp was removed. It was not retrieved however. She has a family history of colon cancer. At the age of 22 she is in overall very good health and I think colon cancer screening, polyp surveillance is still a important clinical concern and so we will proceed with colonoscopy at her soonest convenience. I see no reason for any further blood tests or imaging studies prior to then   Owens Loffler, MD [facility] 07/11/2015, 10:26 AM  Cc: Cassandria Anger, MD

## 2015-07-11 NOTE — Patient Instructions (Addendum)
You will be set up for a colonoscopy for FH of colon cancer (with [place] interpretor).

## 2015-07-12 DIAGNOSIS — Q041 Arhinencephaly: Secondary | ICD-10-CM | POA: Diagnosis not present

## 2015-07-13 DIAGNOSIS — Q041 Arhinencephaly: Secondary | ICD-10-CM | POA: Diagnosis not present

## 2015-07-14 ENCOUNTER — Telehealth: Payer: Self-pay | Admitting: Internal Medicine

## 2015-07-14 NOTE — Telephone Encounter (Signed)
They were ordered Thx

## 2015-07-14 NOTE — Telephone Encounter (Signed)
Needs labs

## 2015-07-16 DIAGNOSIS — Q041 Arhinencephaly: Secondary | ICD-10-CM | POA: Diagnosis not present

## 2015-07-17 DIAGNOSIS — Q041 Arhinencephaly: Secondary | ICD-10-CM | POA: Diagnosis not present

## 2015-07-18 DIAGNOSIS — Q041 Arhinencephaly: Secondary | ICD-10-CM | POA: Diagnosis not present

## 2015-07-19 DIAGNOSIS — Q041 Arhinencephaly: Secondary | ICD-10-CM | POA: Diagnosis not present

## 2015-07-20 DIAGNOSIS — Q041 Arhinencephaly: Secondary | ICD-10-CM | POA: Diagnosis not present

## 2015-07-23 DIAGNOSIS — Q041 Arhinencephaly: Secondary | ICD-10-CM | POA: Diagnosis not present

## 2015-07-24 DIAGNOSIS — Q041 Arhinencephaly: Secondary | ICD-10-CM | POA: Diagnosis not present

## 2015-07-25 DIAGNOSIS — Q041 Arhinencephaly: Secondary | ICD-10-CM | POA: Diagnosis not present

## 2015-07-26 ENCOUNTER — Other Ambulatory Visit (INDEPENDENT_AMBULATORY_CARE_PROVIDER_SITE_OTHER): Payer: Medicare Other

## 2015-07-26 DIAGNOSIS — F411 Generalized anxiety disorder: Secondary | ICD-10-CM

## 2015-07-26 DIAGNOSIS — M25552 Pain in left hip: Secondary | ICD-10-CM

## 2015-07-26 DIAGNOSIS — E785 Hyperlipidemia, unspecified: Secondary | ICD-10-CM | POA: Diagnosis not present

## 2015-07-26 DIAGNOSIS — Q041 Arhinencephaly: Secondary | ICD-10-CM | POA: Diagnosis not present

## 2015-07-26 DIAGNOSIS — K30 Functional dyspepsia: Secondary | ICD-10-CM

## 2015-07-26 DIAGNOSIS — R1013 Epigastric pain: Secondary | ICD-10-CM

## 2015-07-26 DIAGNOSIS — G4700 Insomnia, unspecified: Secondary | ICD-10-CM

## 2015-07-26 LAB — LIPID PANEL
CHOL/HDL RATIO: 4
Cholesterol: 190 mg/dL (ref 0–200)
HDL: 53.6 mg/dL (ref >39.00)
LDL CALC: 120 mg/dL — AB (ref 0–99)
NONHDL: 136.87
TRIGLYCERIDES: 82 mg/dL (ref 0.0–149.0)
VLDL: 16.4 mg/dL (ref 0.0–40.0)

## 2015-07-26 LAB — CBC WITH DIFFERENTIAL/PLATELET
Basophils Absolute: 0 10*3/uL (ref 0.0–0.1)
Basophils Relative: 0.5 % (ref 0.0–3.0)
EOS ABS: 0.1 10*3/uL (ref 0.0–0.7)
EOS PCT: 1.9 % (ref 0.0–5.0)
HCT: 34.2 % — ABNORMAL LOW (ref 36.0–46.0)
Hemoglobin: 11.4 g/dL — ABNORMAL LOW (ref 12.0–15.0)
LYMPHS ABS: 2.4 10*3/uL (ref 0.7–4.0)
Lymphocytes Relative: 42.8 % (ref 12.0–46.0)
MCHC: 33.5 g/dL (ref 30.0–36.0)
MCV: 94.7 fl (ref 78.0–100.0)
Monocytes Absolute: 0.5 10*3/uL (ref 0.1–1.0)
Monocytes Relative: 8.2 % (ref 3.0–12.0)
NEUTROS PCT: 46.6 % (ref 43.0–77.0)
Neutro Abs: 2.6 10*3/uL (ref 1.4–7.7)
Platelets: 248 10*3/uL (ref 150.0–400.0)
RBC: 3.61 Mil/uL — ABNORMAL LOW (ref 3.87–5.11)
RDW: 13.4 % (ref 11.5–15.5)
WBC: 5.5 10*3/uL (ref 4.0–10.5)

## 2015-07-26 LAB — HEPATIC FUNCTION PANEL
ALT: 29 U/L (ref 0–35)
AST: 23 U/L (ref 0–37)
Albumin: 3.7 g/dL (ref 3.5–5.2)
Alkaline Phosphatase: 128 U/L — ABNORMAL HIGH (ref 39–117)
BILIRUBIN DIRECT: 0.1 mg/dL (ref 0.0–0.3)
TOTAL PROTEIN: 6.8 g/dL (ref 6.0–8.3)
Total Bilirubin: 0.4 mg/dL (ref 0.2–1.2)

## 2015-07-26 LAB — URINALYSIS, ROUTINE W REFLEX MICROSCOPIC
BILIRUBIN URINE: NEGATIVE
HGB URINE DIPSTICK: NEGATIVE
Ketones, ur: NEGATIVE
Nitrite: NEGATIVE
Specific Gravity, Urine: 1.01 (ref 1.000–1.030)
Total Protein, Urine: NEGATIVE
UROBILINOGEN UA: 0.2 (ref 0.0–1.0)
Urine Glucose: NEGATIVE
pH: 8 (ref 5.0–8.0)

## 2015-07-26 LAB — BASIC METABOLIC PANEL
BUN: 16 mg/dL (ref 6–23)
CO2: 27 mEq/L (ref 19–32)
Calcium: 9 mg/dL (ref 8.4–10.5)
Chloride: 106 mEq/L (ref 96–112)
Creatinine, Ser: 0.78 mg/dL (ref 0.40–1.20)
GFR: 75.65 mL/min (ref >60.00)
GLUCOSE: 100 mg/dL — AB (ref 70–99)
Potassium: 4.4 mEq/L (ref 3.5–5.1)
SODIUM: 140 meq/L (ref 135–145)

## 2015-07-26 LAB — TSH: TSH: 1.35 u[IU]/mL (ref 0.35–4.50)

## 2015-07-27 DIAGNOSIS — Q041 Arhinencephaly: Secondary | ICD-10-CM | POA: Diagnosis not present

## 2015-07-30 DIAGNOSIS — Q041 Arhinencephaly: Secondary | ICD-10-CM | POA: Diagnosis not present

## 2015-07-31 DIAGNOSIS — Q041 Arhinencephaly: Secondary | ICD-10-CM | POA: Diagnosis not present

## 2015-08-01 DIAGNOSIS — Q041 Arhinencephaly: Secondary | ICD-10-CM | POA: Diagnosis not present

## 2015-08-02 DIAGNOSIS — Q041 Arhinencephaly: Secondary | ICD-10-CM | POA: Diagnosis not present

## 2015-08-03 ENCOUNTER — Ambulatory Visit (INDEPENDENT_AMBULATORY_CARE_PROVIDER_SITE_OTHER): Payer: Medicare Other | Admitting: Internal Medicine

## 2015-08-03 ENCOUNTER — Encounter: Payer: Self-pay | Admitting: Internal Medicine

## 2015-08-03 VITALS — BP 140/80 | HR 60 | Wt 142.0 lb

## 2015-08-03 DIAGNOSIS — R748 Abnormal levels of other serum enzymes: Secondary | ICD-10-CM | POA: Diagnosis not present

## 2015-08-03 DIAGNOSIS — Q041 Arhinencephaly: Secondary | ICD-10-CM | POA: Diagnosis not present

## 2015-08-03 DIAGNOSIS — I10 Essential (primary) hypertension: Secondary | ICD-10-CM | POA: Diagnosis not present

## 2015-08-03 DIAGNOSIS — K30 Functional dyspepsia: Secondary | ICD-10-CM

## 2015-08-03 DIAGNOSIS — R1013 Epigastric pain: Secondary | ICD-10-CM

## 2015-08-03 DIAGNOSIS — E785 Hyperlipidemia, unspecified: Secondary | ICD-10-CM | POA: Diagnosis not present

## 2015-08-03 DIAGNOSIS — G4700 Insomnia, unspecified: Secondary | ICD-10-CM

## 2015-08-03 DIAGNOSIS — K219 Gastro-esophageal reflux disease without esophagitis: Secondary | ICD-10-CM

## 2015-08-03 DIAGNOSIS — B351 Tinea unguium: Secondary | ICD-10-CM

## 2015-08-03 MED ORDER — PANCRELIPASE (LIP-PROT-AMYL) 24000-76000 UNITS PO CPEP: ORAL_CAPSULE | ORAL | Status: DC

## 2015-08-03 MED ORDER — LORAZEPAM 1 MG PO TABS: 1.0000 mg | ORAL_TABLET | Freq: Every evening | ORAL | Status: DC | PRN

## 2015-08-03 MED ORDER — MELOXICAM 7.5 MG PO TABS: 7.5000 mg | ORAL_TABLET | Freq: Every day | ORAL | Status: DC

## 2015-08-03 MED ORDER — CICLOPIROX 8 % EX SOLN: Freq: Every day | CUTANEOUS | Status: DC

## 2015-08-03 NOTE — Assessment & Plan Note (Signed)
Chronic Captopril

## 2015-08-03 NOTE — Assessment & Plan Note (Signed)
On Zetia 

## 2015-08-03 NOTE — Assessment & Plan Note (Signed)
Omeprasole 

## 2015-08-03 NOTE — Assessment & Plan Note (Signed)
Creon prn

## 2015-08-03 NOTE — Assessment & Plan Note (Signed)
[place]

## 2015-08-03 NOTE — Assessment & Plan Note (Signed)
Lorazepam prn  Potential benefits of a long term benzodiazepines  use as well as potential risks  and complications were explained to the patient and were aknowledged.  

## 2015-08-03 NOTE — Progress Notes (Signed)
Pre visit review using our clinic review tool, if applicable. No additional management support is needed unless otherwise documented below in the visit note. 

## 2015-08-03 NOTE — Progress Notes (Signed)
Subjective:  [name], female    DOB: 06/29/1934  Age: 81 y.o. MRN: [id number]  CC: No chief complaint on file.   HPI Macon Minkler presents for HTM, dyslipidemia, GERD, indigestion f/up.  Outpatient Prescriptions Prior to Visit  Medication Sig Dispense Refill  . bisacodyl (DULCOLAX) 5 MG EC tablet Take 1 tablet (5 mg total) by mouth daily as needed for moderate constipation. 5 tablet 0  . butalbital-acetaminophen-caffeine (FIORICET) 50-325-40 MG per tablet Take 1-2 tablets by mouth 2 (two) times daily as needed for headache. 90 tablet 0  . captopril (CAPOTEN) 100 MG tablet Take 1 tablet (100 mg total) by mouth 3 (three) times daily. 270 tablet 3  . Cholecalciferol 1000 UNITS tablet Take 1,000 Units by mouth daily.      Marland Kitchen conjugated estrogens (PREMARIN) vaginal cream Use pv qod x 1 week, then pv q1wk 42.5 g 12  . dipyridamole-aspirin (AGGRENOX) 200-25 MG per 12 hr capsule TAKE 1 CAPSULE BY MOUTH 2 (TWO) TIMES DAILY. 180 capsule 3  . ezetimibe (ZETIA) 10 MG tablet Take 1 tablet (10 mg total) by mouth daily. 90 tablet 3  . neomycin-polymyxin b-dexamethasone (MAXITROL) 3.5-10000-0.1 SUSP   0  . omeprazole (PRILOSEC) 20 MG capsule Take 2 capsules (40 mg total) by mouth daily. 180 capsule 3  . Pancrelipase, Lip-Prot-Amyl, 24000 UNITS CPEP 1 po w/food tid 90 capsule 3  . polyethylene glycol powder (GLYCOLAX/MIRALAX) powder Take 255 g by mouth once. As directed 255 g 0  . propranolol ER (INDERAL LA) 120 MG 24 hr capsule Take 1 capsule (120 mg total) by mouth every morning. 90 capsule 3  . RESTASIS 0.05 % ophthalmic emulsion     . topiramate (TOPAMAX) 50 MG tablet Take 50 mg by mouth daily.  1   No facility-administered medications prior to visit.    ROS Review of Systems  Constitutional: Negative for chills, activity change, appetite change, fatigue and unexpected weight change.  HENT: Negative for congestion, mouth sores and sinus pressure.   Eyes: Negative for visual  disturbance.  Respiratory: Negative for cough and chest tightness.   Gastrointestinal: Negative for nausea and abdominal pain.  Genitourinary: Negative for frequency, difficulty urinating and vaginal pain.  Musculoskeletal: Negative for back pain and gait problem.  Skin: Negative for pallor and rash.  Neurological: Negative for dizziness, tremors, weakness, numbness and headaches.  Psychiatric/Behavioral: Negative for suicidal ideas, confusion and sleep disturbance. The patient is not nervous/anxious.     Objective:  BP 140/80 mmHg  Pulse 60  Wt 142 lb (64.411 kg)  SpO2 98%  BP Readings from Last 3 Encounters:  08/03/15 140/80  07/11/15 126/60  04/02/15 120/82    Wt Readings from Last 3 Encounters:  08/03/15 142 lb (64.411 kg)  07/11/15 148 lb 3 oz (67.217 kg)  04/02/15 141 lb (63.957 kg)    Physical Exam  Constitutional: She appears well-developed. No distress.  HENT:  Head: Normocephalic.  Right Ear: External ear normal.  Left Ear: External ear normal.  Nose: Nose normal.  Mouth/Throat: Oropharynx is clear and moist.  Eyes: Conjunctivae are normal. Pupils are equal, round, and reactive to light. Right eye exhibits no discharge. Left eye exhibits no discharge.  Neck: Normal range of motion. Neck supple. No JVD present. No tracheal deviation present. No thyromegaly present.  Cardiovascular: Normal rate, regular rhythm and normal heart sounds.   Pulmonary/Chest: No stridor. No respiratory distress. She has no wheezes.  Abdominal: Soft. Bowel sounds are normal. She exhibits no  distension and no mass. There is no tenderness. There is no rebound and no guarding.  Musculoskeletal: She exhibits no edema or tenderness.  Lymphadenopathy:    She has no cervical adenopathy.  Neurological: She displays normal reflexes. No cranial nerve deficit. She exhibits normal muscle tone. Coordination normal.  Skin: No rash noted. No erythema.  Psychiatric: She has a normal mood and affect.  Her behavior is normal. Judgment and thought content normal.  onycho  Lab Results  Component Value Date   WBC 5.5 07/26/2015   HGB 11.4* 07/26/2015   HCT 34.2* 07/26/2015   PLT 248.0 07/26/2015   GLUCOSE 100* 07/26/2015   CHOL 190 07/26/2015   TRIG 82.0 07/26/2015   HDL 53.60 07/26/2015   LDLDIRECT 165.2 05/25/2013   LDLCALC 120* 07/26/2015   ALT 29 07/26/2015   AST 23 07/26/2015   NA 140 07/26/2015   K 4.4 07/26/2015   CL 106 07/26/2015   CREATININE 0.78 07/26/2015   BUN 16 07/26/2015   CO2 27 07/26/2015   TSH 1.35 07/26/2015   HGBA1C 5.9 09/29/2007    No results found.  Assessment & Plan:   There are no diagnoses linked to this encounter. I am having Ms. Labell maintain her Cholecalciferol, conjugated estrogens, butalbital-acetaminophen-caffeine, topiramate, RESTASIS, neomycin-polymyxin b-dexamethasone, captopril, ezetimibe, propranolol ER, omeprazole, Pancrelipase (Lip-Prot-Amyl), dipyridamole-aspirin, polyethylene glycol powder, and bisacodyl.  No orders of the defined types were placed in this encounter.     Follow-up: No Follow-up on file.  Walker Kehr, MD

## 2015-08-03 NOTE — Assessment & Plan Note (Signed)
8/16 mild, new ?etiology LFTs in   1-2 mo

## 2015-08-06 DIAGNOSIS — Q041 Arhinencephaly: Secondary | ICD-10-CM | POA: Diagnosis not present

## 2015-08-07 DIAGNOSIS — Q041 Arhinencephaly: Secondary | ICD-10-CM | POA: Diagnosis not present

## 2015-08-08 DIAGNOSIS — Q041 Arhinencephaly: Secondary | ICD-10-CM | POA: Diagnosis not present

## 2015-08-09 DIAGNOSIS — Q041 Arhinencephaly: Secondary | ICD-10-CM | POA: Diagnosis not present

## 2015-08-10 DIAGNOSIS — Q041 Arhinencephaly: Secondary | ICD-10-CM | POA: Diagnosis not present

## 2015-08-13 DIAGNOSIS — Q041 Arhinencephaly: Secondary | ICD-10-CM | POA: Diagnosis not present

## 2015-08-14 DIAGNOSIS — Q041 Arhinencephaly: Secondary | ICD-10-CM | POA: Diagnosis not present

## 2015-08-15 DIAGNOSIS — Q041 Arhinencephaly: Secondary | ICD-10-CM | POA: Diagnosis not present

## 2015-08-16 DIAGNOSIS — Q041 Arhinencephaly: Secondary | ICD-10-CM | POA: Diagnosis not present

## 2015-08-17 DIAGNOSIS — Q041 Arhinencephaly: Secondary | ICD-10-CM | POA: Diagnosis not present

## 2015-08-20 DIAGNOSIS — Q041 Arhinencephaly: Secondary | ICD-10-CM | POA: Diagnosis not present

## 2015-08-21 DIAGNOSIS — Q041 Arhinencephaly: Secondary | ICD-10-CM | POA: Diagnosis not present

## 2015-08-22 DIAGNOSIS — Q041 Arhinencephaly: Secondary | ICD-10-CM | POA: Diagnosis not present

## 2015-08-23 DIAGNOSIS — Q041 Arhinencephaly: Secondary | ICD-10-CM | POA: Diagnosis not present

## 2015-08-24 DIAGNOSIS — Q041 Arhinencephaly: Secondary | ICD-10-CM | POA: Diagnosis not present

## 2015-08-27 DIAGNOSIS — Q041 Arhinencephaly: Secondary | ICD-10-CM | POA: Diagnosis not present

## 2015-08-28 DIAGNOSIS — Q041 Arhinencephaly: Secondary | ICD-10-CM | POA: Diagnosis not present

## 2015-08-29 DIAGNOSIS — Q041 Arhinencephaly: Secondary | ICD-10-CM | POA: Diagnosis not present

## 2015-08-30 DIAGNOSIS — Q041 Arhinencephaly: Secondary | ICD-10-CM | POA: Diagnosis not present

## 2015-08-31 DIAGNOSIS — Q041 Arhinencephaly: Secondary | ICD-10-CM | POA: Diagnosis not present

## 2015-09-03 DIAGNOSIS — Q041 Arhinencephaly: Secondary | ICD-10-CM | POA: Diagnosis not present

## 2015-09-04 DIAGNOSIS — Q041 Arhinencephaly: Secondary | ICD-10-CM | POA: Diagnosis not present

## 2015-09-05 DIAGNOSIS — Q041 Arhinencephaly: Secondary | ICD-10-CM | POA: Diagnosis not present

## 2015-09-05 DIAGNOSIS — H26493 Other secondary cataract, bilateral: Secondary | ICD-10-CM | POA: Diagnosis not present

## 2015-09-05 DIAGNOSIS — Z961 Presence of intraocular lens: Secondary | ICD-10-CM | POA: Diagnosis not present

## 2015-09-06 DIAGNOSIS — Q041 Arhinencephaly: Secondary | ICD-10-CM | POA: Diagnosis not present

## 2015-09-07 DIAGNOSIS — Q041 Arhinencephaly: Secondary | ICD-10-CM | POA: Diagnosis not present

## 2015-09-10 ENCOUNTER — Other Ambulatory Visit (INDEPENDENT_AMBULATORY_CARE_PROVIDER_SITE_OTHER): Payer: Medicare Other

## 2015-09-10 DIAGNOSIS — R748 Abnormal levels of other serum enzymes: Secondary | ICD-10-CM | POA: Diagnosis not present

## 2015-09-10 DIAGNOSIS — Q041 Arhinencephaly: Secondary | ICD-10-CM | POA: Diagnosis not present

## 2015-09-10 LAB — HEPATIC FUNCTION PANEL
ALBUMIN: 3.7 g/dL (ref 3.5–5.2)
ALT: 13 U/L (ref 0–35)
AST: 14 U/L (ref 0–37)
Alkaline Phosphatase: 71 U/L (ref 39–117)
Bilirubin, Direct: 0.1 mg/dL (ref 0.0–0.3)
Total Bilirubin: 0.4 mg/dL (ref 0.2–1.2)
Total Protein: 6.6 g/dL (ref 6.0–8.3)

## 2015-09-11 DIAGNOSIS — Q041 Arhinencephaly: Secondary | ICD-10-CM | POA: Diagnosis not present

## 2015-09-12 DIAGNOSIS — Q041 Arhinencephaly: Secondary | ICD-10-CM | POA: Diagnosis not present

## 2015-09-13 ENCOUNTER — Other Ambulatory Visit: Payer: Self-pay | Admitting: Internal Medicine

## 2015-09-13 DIAGNOSIS — Q041 Arhinencephaly: Secondary | ICD-10-CM | POA: Diagnosis not present

## 2015-09-13 MED ORDER — CICLOPIROX 8 % EX SOLN: Freq: Every day | CUTANEOUS | Status: DC

## 2015-09-14 DIAGNOSIS — Q041 Arhinencephaly: Secondary | ICD-10-CM | POA: Diagnosis not present

## 2015-09-17 DIAGNOSIS — Q041 Arhinencephaly: Secondary | ICD-10-CM | POA: Diagnosis not present

## 2015-09-18 ENCOUNTER — Encounter: Payer: Self-pay | Admitting: Gastroenterology

## 2015-09-18 ENCOUNTER — Ambulatory Visit (AMBULATORY_SURGERY_CENTER): Payer: Medicare Other | Admitting: Gastroenterology

## 2015-09-18 VITALS — BP 150/87 | HR 79 | Temp 96.0°F | Resp 15 | Ht 63.0 in | Wt 148.0 lb

## 2015-09-18 DIAGNOSIS — K5730 Diverticulosis of large intestine without perforation or abscess without bleeding: Secondary | ICD-10-CM

## 2015-09-18 DIAGNOSIS — Z800 Family history of malignant neoplasm of digestive organs: Secondary | ICD-10-CM | POA: Diagnosis present

## 2015-09-18 DIAGNOSIS — K639 Disease of intestine, unspecified: Secondary | ICD-10-CM

## 2015-09-18 DIAGNOSIS — Z1211 Encounter for screening for malignant neoplasm of colon: Secondary | ICD-10-CM

## 2015-09-18 DIAGNOSIS — K5140 Inflammatory polyps of colon without complications: Secondary | ICD-10-CM | POA: Diagnosis not present

## 2015-09-18 DIAGNOSIS — K635 Polyp of colon: Secondary | ICD-10-CM

## 2015-09-18 DIAGNOSIS — D125 Benign neoplasm of sigmoid colon: Secondary | ICD-10-CM

## 2015-09-18 DIAGNOSIS — Q041 Arhinencephaly: Secondary | ICD-10-CM | POA: Diagnosis not present

## 2015-09-18 MED ORDER — SODIUM CHLORIDE 0.9 % IV SOLN: 500.0000 mL | INTRAVENOUS | Status: DC

## 2015-09-18 NOTE — Progress Notes (Signed)
Report to PACU, RN, vss, BBS= Clear.  

## 2015-09-18 NOTE — Progress Notes (Signed)
Pt's son was called to work as an emergency; Veterinary surgeon, Rosalyn Charters, is here with pt. She is a Veterinary surgeon from [employer]. Interpretor states she will be pt's carepartner.  I discussed with pt that carepartner will be responsible for staying the entire time, speak with about results and making medical decisions for pt.  Pt voiced understanding.

## 2015-09-18 NOTE — Patient Instructions (Signed)
YOU HAD AN ENDOSCOPIC PROCEDURE TODAY AT THE [facility]:   Refer to the procedure report that was given to you for any specific questions about what was found during the examination.  If the procedure report does not answer your questions, please call your gastroenterologist to clarify.  If you requested that your care partner not be given the details of your procedure findings, then the procedure report has been included in a sealed envelope for you to review at your convenience later.  YOU SHOULD EXPECT: Some feelings of bloating in the abdomen. Passage of more gas than usual.  Walking can help get rid of the air that was put into your GI tract during the procedure and reduce the bloating. If you had a lower endoscopy (such as a colonoscopy or flexible sigmoidoscopy) you may notice spotting of blood in your stool or on the toilet paper. If you underwent a bowel prep for your procedure, you may not have a normal bowel movement for a few days.  Please Note:  You might notice some irritation and congestion in your nose or some drainage.  This is from the oxygen used during your procedure.  There is no need for concern and it should clear up in a day or so.  SYMPTOMS TO REPORT IMMEDIATELY:   Following lower endoscopy (colonoscopy or flexible sigmoidoscopy):  Excessive amounts of blood in the stool  Significant tenderness or worsening of abdominal pains  Swelling of the abdomen that is new, acute  Fever of 100F or higher    For urgent or emergent issues, a gastroenterologist can be reached at any hour by calling [phone].   DIET: Your first meal following the procedure should be a small meal and then it is ok to progress to your normal diet. Heavy or fried foods are harder to digest and may make you feel nauseous or bloated.  Likewise, meals heavy in dairy and vegetables can increase bloating.  Drink plenty of fluids but you should avoid alcoholic beverages for 24  hours.  ACTIVITY:  You should plan to take it easy for the rest of today and you should NOT DRIVE or use heavy machinery until tomorrow (because of the sedation medicines used during the test).    FOLLOW UP: Our staff will call the number listed on your records the next business day following your procedure to check on you and address any questions or concerns that you may have regarding the information given to you following your procedure. If we do not reach you, we will leave a message.  However, if you are feeling well and you are not experiencing any problems, there is no need to return our call.  We will assume that you have returned to your regular daily activities without incident.  If any biopsies were taken you will be contacted by phone or by letter within the next 1-3 weeks.  Please call us at [phone] if you have not heard about the biopsies in 3 weeks.    SIGNATURES/CONFIDENTIALITY: You and/or your care partner have signed paperwork which will be entered into your electronic medical record.  These signatures attest to the fact that that the information above on your After Visit Summary has been reviewed and is understood.  Full responsibility of the confidentiality of this discharge information lies with you and/or your care-partner.   Resume medications. Information given on polyps,diverticulosis and high fiber diet. 

## 2015-09-18 NOTE — Progress Notes (Signed)
Called to room to assist during endoscopic procedure.  Patient ID and intended procedure confirmed with present staff. Received instructions for my participation in the procedure from the performing physician.  

## 2015-09-18 NOTE — Op Note (Signed)
[place]  [address] [place], [id number]   COLONOSCOPY PROCEDURE REPORT  PATIENT: Sophia Santos, Sophia Santos  MR#: [id number] BIRTHDATE: 1934/10/02 , 81  yrs. old GENDER: female ENDOSCOPIST: Milus Banister, MD REFERRED XE:NMMH Avel Sensor, M.D. PROCEDURE DATE:  09/18/2015 PROCEDURE:   Colonoscopy, screening, Colonoscopy with biopsy, Colonoscopy with snare polypectomy, and Submucosal injection, any substance First Screening Colonoscopy - Avg.  risk and is 50 yrs.  old or older - No.  Prior Negative Screening - Now for repeat screening. Above average risk  History of Adenoma - Now for follow-up colonoscopy & has been > or = to 3 yrs.  N/A  Polyps removed today? Yes ASA CLASS:   Class II INDICATIONS:mother had colon cancer. MEDICATIONS: Monitored anesthesia care and Propofol 200 mg IV  DESCRIPTION OF PROCEDURE:   After the risks benefits and alternatives of the procedure were thoroughly explained, informed consent was obtained.  The digital rectal exam revealed no abnormalities of the rectum.   The LB [id number]  endoscope was introduced through the anus and advanced to the cecum, which was identified by both the appendix and ileocecal valve. No adverse events experienced.   The quality of the prep was excellent.  The instrument was then slowly withdrawn as the colon was fully examined. Estimated blood loss is zero unless otherwise noted in this procedure report.  COLON FINDINGS: There were numerous medium to large diverticulum throughout the left colon.  Closely associated with one diverticulum (at 35cm from anus in sigmoid segment) there was a 1cm polypoid nodule and adjacent abnormal mucosa.  The mucosa was speckeled appearing, not clearly neoplastic.  The mucosa was sampled with biopsy, the nodule was removed with snare/cautery and the site was labeled with submucosal injection of SPOT.  The examination was otherwise normal.  Retroflexed views revealed  no abnormalities. The time to cecum = 3.7 Withdrawal time = 18.7   The scope was withdrawn and the procedure completed. COMPLICATIONS: There were no immediate complications.  ENDOSCOPIC IMPRESSION: There were numerous medium to large diverticulum throughout the left colon.  Closely associated with one diverticulum (at 35cm from anus in sigmoid segment) there was a 1cm polypoid nodule and adjacent abnormal mucosa.  The mucosa was speckeled appearing, not clearly neoplastic.  The mucosa was sampled with biopsy, the nodule was removed with snare/cautery and the site was labeled with submucosal injection of SPOT.  The examination was otherwise normal  RECOMMENDATIONS: Await final pathology results.  eSigned:  Milus Banister, MD 09/18/2015 10:02 AM

## 2015-09-19 ENCOUNTER — Telehealth: Payer: Self-pay | Admitting: *Deleted

## 2015-09-19 DIAGNOSIS — Q041 Arhinencephaly: Secondary | ICD-10-CM | POA: Diagnosis not present

## 2015-09-19 DIAGNOSIS — Z23 Encounter for immunization: Secondary | ICD-10-CM | POA: Diagnosis not present

## 2015-09-19 NOTE — Telephone Encounter (Signed)
  Follow up Call-  Call back number 09/18/2015  Post procedure Call Back phone  # [phone]  Permission to leave phone message Yes     Patient questions:  Do you have a fever, pain , or abdominal swelling? No. Pain Score  0 *  Have you tolerated food without any problems? Yes.    Have you been able to return to your normal activities? Yes.    Do you have any questions about your discharge instructions: Diet   No. Medications  No. Follow up visit  No.  Do you have questions or concerns about your Care? No.  Actions: * If pain score is 4 or above: No action needed, pain <4.

## 2015-09-20 DIAGNOSIS — Q041 Arhinencephaly: Secondary | ICD-10-CM | POA: Diagnosis not present

## 2015-09-21 DIAGNOSIS — Q041 Arhinencephaly: Secondary | ICD-10-CM | POA: Diagnosis not present

## 2015-09-24 DIAGNOSIS — Q041 Arhinencephaly: Secondary | ICD-10-CM | POA: Diagnosis not present

## 2015-09-25 DIAGNOSIS — Q041 Arhinencephaly: Secondary | ICD-10-CM | POA: Diagnosis not present

## 2015-09-26 ENCOUNTER — Encounter: Payer: Self-pay | Admitting: Gastroenterology

## 2015-09-26 DIAGNOSIS — Q041 Arhinencephaly: Secondary | ICD-10-CM | POA: Diagnosis not present

## 2015-09-27 DIAGNOSIS — N3281 Overactive bladder: Secondary | ICD-10-CM | POA: Diagnosis not present

## 2015-09-27 DIAGNOSIS — Q041 Arhinencephaly: Secondary | ICD-10-CM | POA: Diagnosis not present

## 2015-09-27 DIAGNOSIS — N814 Uterovaginal prolapse, unspecified: Secondary | ICD-10-CM | POA: Diagnosis not present

## 2015-09-28 DIAGNOSIS — Q041 Arhinencephaly: Secondary | ICD-10-CM | POA: Diagnosis not present

## 2015-10-05 DIAGNOSIS — Z961 Presence of intraocular lens: Secondary | ICD-10-CM | POA: Diagnosis not present

## 2015-10-08 DIAGNOSIS — Q041 Arhinencephaly: Secondary | ICD-10-CM | POA: Diagnosis not present

## 2015-10-09 DIAGNOSIS — Q041 Arhinencephaly: Secondary | ICD-10-CM | POA: Diagnosis not present

## 2015-10-10 DIAGNOSIS — Q041 Arhinencephaly: Secondary | ICD-10-CM | POA: Diagnosis not present

## 2015-10-11 DIAGNOSIS — Q041 Arhinencephaly: Secondary | ICD-10-CM | POA: Diagnosis not present

## 2015-10-12 DIAGNOSIS — Q041 Arhinencephaly: Secondary | ICD-10-CM | POA: Diagnosis not present

## 2015-10-15 ENCOUNTER — Telehealth: Payer: Self-pay | Admitting: Gastroenterology

## 2015-10-15 DIAGNOSIS — Q041 Arhinencephaly: Secondary | ICD-10-CM | POA: Diagnosis not present

## 2015-10-16 DIAGNOSIS — Q041 Arhinencephaly: Secondary | ICD-10-CM | POA: Diagnosis not present

## 2015-10-16 NOTE — Telephone Encounter (Signed)
Pt has been notified and all questions answered 

## 2015-10-16 NOTE — Telephone Encounter (Signed)
Left message on machine to call back  

## 2015-10-17 DIAGNOSIS — Q041 Arhinencephaly: Secondary | ICD-10-CM | POA: Diagnosis not present

## 2015-10-18 DIAGNOSIS — Q041 Arhinencephaly: Secondary | ICD-10-CM | POA: Diagnosis not present

## 2015-10-19 DIAGNOSIS — Q041 Arhinencephaly: Secondary | ICD-10-CM | POA: Diagnosis not present

## 2015-10-22 DIAGNOSIS — Q041 Arhinencephaly: Secondary | ICD-10-CM | POA: Diagnosis not present

## 2015-10-23 DIAGNOSIS — Q041 Arhinencephaly: Secondary | ICD-10-CM | POA: Diagnosis not present

## 2015-10-24 DIAGNOSIS — Q041 Arhinencephaly: Secondary | ICD-10-CM | POA: Diagnosis not present

## 2015-10-24 DIAGNOSIS — L570 Actinic keratosis: Secondary | ICD-10-CM | POA: Diagnosis not present

## 2015-10-24 DIAGNOSIS — L720 Epidermal cyst: Secondary | ICD-10-CM | POA: Diagnosis not present

## 2015-10-24 DIAGNOSIS — D049 Carcinoma in situ of skin, unspecified: Secondary | ICD-10-CM | POA: Diagnosis not present

## 2015-10-25 DIAGNOSIS — Q041 Arhinencephaly: Secondary | ICD-10-CM | POA: Diagnosis not present

## 2015-10-26 DIAGNOSIS — Q041 Arhinencephaly: Secondary | ICD-10-CM | POA: Diagnosis not present

## 2015-10-29 DIAGNOSIS — Q041 Arhinencephaly: Secondary | ICD-10-CM | POA: Diagnosis not present

## 2015-10-30 DIAGNOSIS — Z1231 Encounter for screening mammogram for malignant neoplasm of breast: Secondary | ICD-10-CM | POA: Diagnosis not present

## 2015-10-30 DIAGNOSIS — Q041 Arhinencephaly: Secondary | ICD-10-CM | POA: Diagnosis not present

## 2015-10-31 DIAGNOSIS — Z961 Presence of intraocular lens: Secondary | ICD-10-CM | POA: Diagnosis not present

## 2015-10-31 DIAGNOSIS — Q041 Arhinencephaly: Secondary | ICD-10-CM | POA: Diagnosis not present

## 2015-11-01 DIAGNOSIS — Q041 Arhinencephaly: Secondary | ICD-10-CM | POA: Diagnosis not present

## 2015-11-02 DIAGNOSIS — Q041 Arhinencephaly: Secondary | ICD-10-CM | POA: Diagnosis not present

## 2015-11-05 DIAGNOSIS — Q041 Arhinencephaly: Secondary | ICD-10-CM | POA: Diagnosis not present

## 2015-11-06 DIAGNOSIS — Q041 Arhinencephaly: Secondary | ICD-10-CM | POA: Diagnosis not present

## 2015-11-07 DIAGNOSIS — Q041 Arhinencephaly: Secondary | ICD-10-CM | POA: Diagnosis not present

## 2015-11-08 DIAGNOSIS — Q041 Arhinencephaly: Secondary | ICD-10-CM | POA: Diagnosis not present

## 2015-11-09 DIAGNOSIS — Q041 Arhinencephaly: Secondary | ICD-10-CM | POA: Diagnosis not present

## 2015-11-12 DIAGNOSIS — Q041 Arhinencephaly: Secondary | ICD-10-CM | POA: Diagnosis not present

## 2015-11-12 DIAGNOSIS — N898 Other specified noninflammatory disorders of vagina: Secondary | ICD-10-CM | POA: Diagnosis not present

## 2015-11-13 DIAGNOSIS — Q041 Arhinencephaly: Secondary | ICD-10-CM | POA: Diagnosis not present

## 2015-11-14 DIAGNOSIS — Q041 Arhinencephaly: Secondary | ICD-10-CM | POA: Diagnosis not present

## 2015-11-15 DIAGNOSIS — Q041 Arhinencephaly: Secondary | ICD-10-CM | POA: Diagnosis not present

## 2015-11-16 DIAGNOSIS — Q041 Arhinencephaly: Secondary | ICD-10-CM | POA: Diagnosis not present

## 2015-11-19 DIAGNOSIS — Q041 Arhinencephaly: Secondary | ICD-10-CM | POA: Diagnosis not present

## 2015-11-20 DIAGNOSIS — Q041 Arhinencephaly: Secondary | ICD-10-CM | POA: Diagnosis not present

## 2015-11-21 ENCOUNTER — Encounter: Payer: Self-pay | Admitting: Internal Medicine

## 2015-11-21 DIAGNOSIS — Q041 Arhinencephaly: Secondary | ICD-10-CM | POA: Diagnosis not present

## 2015-11-22 DIAGNOSIS — Q041 Arhinencephaly: Secondary | ICD-10-CM | POA: Diagnosis not present

## 2015-11-23 DIAGNOSIS — Q041 Arhinencephaly: Secondary | ICD-10-CM | POA: Diagnosis not present

## 2015-11-26 DIAGNOSIS — Q041 Arhinencephaly: Secondary | ICD-10-CM | POA: Diagnosis not present

## 2015-11-27 DIAGNOSIS — Q041 Arhinencephaly: Secondary | ICD-10-CM | POA: Diagnosis not present

## 2015-11-28 DIAGNOSIS — Q041 Arhinencephaly: Secondary | ICD-10-CM | POA: Diagnosis not present

## 2015-11-29 DIAGNOSIS — Q041 Arhinencephaly: Secondary | ICD-10-CM | POA: Diagnosis not present

## 2015-11-30 DIAGNOSIS — Q041 Arhinencephaly: Secondary | ICD-10-CM | POA: Diagnosis not present

## 2015-12-03 ENCOUNTER — Encounter: Payer: Self-pay | Admitting: Internal Medicine

## 2015-12-03 ENCOUNTER — Ambulatory Visit (INDEPENDENT_AMBULATORY_CARE_PROVIDER_SITE_OTHER): Payer: Medicare Other | Admitting: Internal Medicine

## 2015-12-03 VITALS — BP 138/80 | HR 56 | Wt 143.0 lb

## 2015-12-03 DIAGNOSIS — I10 Essential (primary) hypertension: Secondary | ICD-10-CM | POA: Diagnosis not present

## 2015-12-03 DIAGNOSIS — Q041 Arhinencephaly: Secondary | ICD-10-CM | POA: Diagnosis not present

## 2015-12-03 DIAGNOSIS — E785 Hyperlipidemia, unspecified: Secondary | ICD-10-CM | POA: Diagnosis not present

## 2015-12-03 DIAGNOSIS — G44201 Tension-type headache, unspecified, intractable: Secondary | ICD-10-CM | POA: Diagnosis not present

## 2015-12-03 DIAGNOSIS — G4700 Insomnia, unspecified: Secondary | ICD-10-CM

## 2015-12-03 DIAGNOSIS — R519 Headache, unspecified: Secondary | ICD-10-CM | POA: Insufficient documentation

## 2015-12-03 DIAGNOSIS — R51 Headache: Secondary | ICD-10-CM

## 2015-12-03 MED ORDER — EZETIMIBE 10 MG PO TABS: 10.0000 mg | ORAL_TABLET | Freq: Every day | ORAL | Status: DC

## 2015-12-03 MED ORDER — LORAZEPAM 1 MG PO TABS: 1.0000 mg | ORAL_TABLET | Freq: Every evening | ORAL | Status: DC | PRN

## 2015-12-03 MED ORDER — BUTALBITAL-APAP-CAFFEINE 50-325-40 MG PO TABS: 1.0000 | ORAL_TABLET | Freq: Two times a day (BID) | ORAL | Status: DC | PRN

## 2015-12-03 MED ORDER — CICLOPIROX 8 % EX SOLN: Freq: Every day | CUTANEOUS | Status: DC

## 2015-12-03 NOTE — Assessment & Plan Note (Signed)
Captopril Risks associated with treatment noncompliance were discussed. Compliance was encouraged. 

## 2015-12-03 NOTE — Progress Notes (Signed)
Subjective:  [name], female    DOB: 02-Feb-1934  Age: 81 y.o. MRN: [id number]  CC: No chief complaint on file.   HPI Nancyjo Bellard presents for HTN, HAs, OA, insomnia f/u. Pt declined Prevnar, Zostavax   Outpatient Prescriptions Prior to Visit  Medication Sig Dispense Refill  . butalbital-acetaminophen-caffeine (FIORICET) 50-325-40 MG per tablet Take 1-2 tablets by mouth 2 (two) times daily as needed for headache. 90 tablet 0  . captopril (CAPOTEN) 100 MG tablet Take 1 tablet (100 mg total) by mouth 3 (three) times daily. 270 tablet 3  . Cholecalciferol 1000 UNITS tablet Take 1,000 Units by mouth daily.      . ciclopirox (PENLAC) 8 % solution Apply topically at bedtime. Apply over nail and surrounding skin. Apply daily over previous coat. After seven (7) days, may remove with alcohol and continue cycle. 6.6 mL 2  . conjugated estrogens (PREMARIN) vaginal cream Use pv qod x 1 week, then pv q1wk 42.5 g 12  . dipyridamole-aspirin (AGGRENOX) 200-25 MG per 12 hr capsule TAKE 1 CAPSULE BY MOUTH 2 (TWO) TIMES DAILY. 180 capsule 3  . ezetimibe (ZETIA) 10 MG tablet Take 1 tablet (10 mg total) by mouth daily. 90 tablet 3  . LORazepam (ATIVAN) 1 MG tablet Take 1 tablet (1 mg total) by mouth at bedtime as needed for sleep. 30 tablet 5  . meloxicam (MOBIC) 7.5 MG tablet Take 1 tablet (7.5 mg total) by mouth daily. 30 tablet 3  . omeprazole (PRILOSEC) 20 MG capsule Take 2 capsules (40 mg total) by mouth daily. 180 capsule 3  . Pancrelipase, Lip-Prot-Amyl, 24000 UNITS CPEP 1 po w/food tid 90 capsule 3  . propranolol ER (INDERAL LA) 120 MG 24 hr capsule Take 1 capsule (120 mg total) by mouth every morning. 90 capsule 3  . RESTASIS 0.05 % ophthalmic emulsion     . topiramate (TOPAMAX) 50 MG tablet Take 50 mg by mouth daily.  1   No facility-administered medications prior to visit.    ROS Review of Systems  Constitutional: Negative for chills, activity change, appetite change, fatigue  and unexpected weight change.  HENT: Negative for congestion, mouth sores and sinus pressure.   Eyes: Negative for visual disturbance.  Respiratory: Negative for cough and chest tightness.   Gastrointestinal: Negative for nausea and abdominal pain.  Genitourinary: Negative for frequency, difficulty urinating and vaginal pain.  Musculoskeletal: Negative for back pain and gait problem.  Skin: Negative for pallor and rash.  Neurological: Negative for dizziness, tremors, weakness, numbness and headaches.  Psychiatric/Behavioral: Negative for confusion and sleep disturbance.    Objective:  BP 138/80 mmHg  Pulse 56  Wt 143 lb (64.864 kg)  SpO2 98%  BP Readings from Last 3 Encounters:  12/03/15 138/80  09/18/15 150/87  08/03/15 140/80    Wt Readings from Last 3 Encounters:  12/03/15 143 lb (64.864 kg)  09/18/15 148 lb (67.132 kg)  08/03/15 142 lb (64.411 kg)    Physical Exam  Constitutional: She appears well-developed. No distress.  HENT:  Head: Normocephalic.  Right Ear: External ear normal.  Left Ear: External ear normal.  Nose: Nose normal.  Mouth/Throat: Oropharynx is clear and moist.  Eyes: Conjunctivae are normal. Pupils are equal, round, and reactive to light. Right eye exhibits no discharge. Left eye exhibits no discharge.  Neck: Normal range of motion. Neck supple. No JVD present. No tracheal deviation present. No thyromegaly present.  Cardiovascular: Normal rate, regular rhythm and normal heart sounds.  Pulmonary/Chest: No stridor. No respiratory distress. She has no wheezes.  Abdominal: Soft. Bowel sounds are normal. She exhibits no distension and no mass. There is no tenderness. There is no rebound and no guarding.  Musculoskeletal: She exhibits no edema or tenderness.  Lymphadenopathy:    She has no cervical adenopathy.  Neurological: She displays normal reflexes. No cranial nerve deficit. She exhibits normal muscle tone. Coordination normal.  Skin: No rash  noted. No erythema.  Psychiatric: She has a normal mood and affect. Her behavior is normal. Judgment and thought content normal.    Lab Results  Component Value Date   WBC 5.5 07/26/2015   HGB 11.4* 07/26/2015   HCT 34.2* 07/26/2015   PLT 248.0 07/26/2015   GLUCOSE 100* 07/26/2015   CHOL 190 07/26/2015   TRIG 82.0 07/26/2015   HDL 53.60 07/26/2015   LDLDIRECT 165.2 05/25/2013   LDLCALC 120* 07/26/2015   ALT 13 09/10/2015   AST 14 09/10/2015   NA 140 07/26/2015   K 4.4 07/26/2015   CL 106 07/26/2015   CREATININE 0.78 07/26/2015   BUN 16 07/26/2015   CO2 27 07/26/2015   TSH 1.35 07/26/2015   HGBA1C 5.9 09/29/2007    No results found.  Assessment & Plan:   There are no diagnoses linked to this encounter. I am having Ms. Koone maintain her Cholecalciferol, conjugated estrogens, butalbital-acetaminophen-caffeine, topiramate, RESTASIS, captopril, ezetimibe, propranolol ER, omeprazole, dipyridamole-aspirin, LORazepam, meloxicam, Pancrelipase (Lip-Prot-Amyl), and ciclopirox.  No orders of the defined types were placed in this encounter.     Follow-up: No Follow-up on file.  Walker Kehr, MD

## 2015-12-03 NOTE — Progress Notes (Signed)
Pre visit review using our clinic review tool, if applicable. No additional management support is needed unless otherwise documented below in the visit note. 

## 2015-12-03 NOTE — Assessment & Plan Note (Signed)
Lorazepam 1-2 mg prn  Potential benefits of a long term benzodiazepines  use as well as potential risks  and complications were explained to the patient and were aknowledged.

## 2015-12-03 NOTE — Assessment & Plan Note (Signed)
On Zetia 

## 2015-12-04 DIAGNOSIS — Q041 Arhinencephaly: Secondary | ICD-10-CM | POA: Diagnosis not present

## 2015-12-04 DIAGNOSIS — N765 Ulceration of vagina: Secondary | ICD-10-CM | POA: Diagnosis not present

## 2015-12-04 DIAGNOSIS — N950 Postmenopausal bleeding: Secondary | ICD-10-CM | POA: Diagnosis not present

## 2015-12-05 DIAGNOSIS — Q041 Arhinencephaly: Secondary | ICD-10-CM | POA: Diagnosis not present

## 2015-12-06 DIAGNOSIS — Q041 Arhinencephaly: Secondary | ICD-10-CM | POA: Diagnosis not present

## 2015-12-07 DIAGNOSIS — Q041 Arhinencephaly: Secondary | ICD-10-CM | POA: Diagnosis not present

## 2015-12-10 DIAGNOSIS — Q041 Arhinencephaly: Secondary | ICD-10-CM | POA: Diagnosis not present

## 2015-12-11 DIAGNOSIS — Q041 Arhinencephaly: Secondary | ICD-10-CM | POA: Diagnosis not present

## 2015-12-12 DIAGNOSIS — Q041 Arhinencephaly: Secondary | ICD-10-CM | POA: Diagnosis not present

## 2015-12-13 DIAGNOSIS — Q041 Arhinencephaly: Secondary | ICD-10-CM | POA: Diagnosis not present

## 2015-12-14 DIAGNOSIS — Q041 Arhinencephaly: Secondary | ICD-10-CM | POA: Diagnosis not present

## 2015-12-17 DIAGNOSIS — Q041 Arhinencephaly: Secondary | ICD-10-CM | POA: Diagnosis not present

## 2015-12-18 DIAGNOSIS — Q041 Arhinencephaly: Secondary | ICD-10-CM | POA: Diagnosis not present

## 2015-12-19 DIAGNOSIS — Q041 Arhinencephaly: Secondary | ICD-10-CM | POA: Diagnosis not present

## 2015-12-20 DIAGNOSIS — Q041 Arhinencephaly: Secondary | ICD-10-CM | POA: Diagnosis not present

## 2015-12-20 DIAGNOSIS — G43019 Migraine without aura, intractable, without status migrainosus: Secondary | ICD-10-CM | POA: Diagnosis not present

## 2015-12-20 DIAGNOSIS — G43719 Chronic migraine without aura, intractable, without status migrainosus: Secondary | ICD-10-CM | POA: Diagnosis not present

## 2015-12-21 DIAGNOSIS — Q041 Arhinencephaly: Secondary | ICD-10-CM | POA: Diagnosis not present

## 2015-12-24 DIAGNOSIS — Q041 Arhinencephaly: Secondary | ICD-10-CM | POA: Diagnosis not present

## 2015-12-25 DIAGNOSIS — Q041 Arhinencephaly: Secondary | ICD-10-CM | POA: Diagnosis not present

## 2015-12-26 DIAGNOSIS — Q041 Arhinencephaly: Secondary | ICD-10-CM | POA: Diagnosis not present

## 2015-12-27 DIAGNOSIS — Q041 Arhinencephaly: Secondary | ICD-10-CM | POA: Diagnosis not present

## 2015-12-28 DIAGNOSIS — Q041 Arhinencephaly: Secondary | ICD-10-CM | POA: Diagnosis not present

## 2015-12-31 DIAGNOSIS — Q041 Arhinencephaly: Secondary | ICD-10-CM | POA: Diagnosis not present

## 2016-01-01 DIAGNOSIS — Q041 Arhinencephaly: Secondary | ICD-10-CM | POA: Diagnosis not present

## 2016-01-02 DIAGNOSIS — Q041 Arhinencephaly: Secondary | ICD-10-CM | POA: Diagnosis not present

## 2016-01-03 DIAGNOSIS — Q041 Arhinencephaly: Secondary | ICD-10-CM | POA: Diagnosis not present

## 2016-01-04 DIAGNOSIS — Q041 Arhinencephaly: Secondary | ICD-10-CM | POA: Diagnosis not present

## 2016-01-07 DIAGNOSIS — Q041 Arhinencephaly: Secondary | ICD-10-CM | POA: Diagnosis not present

## 2016-01-08 DIAGNOSIS — Q041 Arhinencephaly: Secondary | ICD-10-CM | POA: Diagnosis not present

## 2016-01-09 DIAGNOSIS — Q041 Arhinencephaly: Secondary | ICD-10-CM | POA: Diagnosis not present

## 2016-01-10 DIAGNOSIS — Q041 Arhinencephaly: Secondary | ICD-10-CM | POA: Diagnosis not present

## 2016-01-11 DIAGNOSIS — Q041 Arhinencephaly: Secondary | ICD-10-CM | POA: Diagnosis not present

## 2016-01-14 DIAGNOSIS — Q041 Arhinencephaly: Secondary | ICD-10-CM | POA: Diagnosis not present

## 2016-01-15 ENCOUNTER — Telehealth: Payer: Self-pay | Admitting: Internal Medicine

## 2016-01-15 DIAGNOSIS — Q041 Arhinencephaly: Secondary | ICD-10-CM | POA: Diagnosis not present

## 2016-01-15 NOTE — Telephone Encounter (Signed)
Rosann Auerbach from [employer] is following up on a fax she sent regarding pts request for knee supports. She can be reached at [phone]

## 2016-01-16 DIAGNOSIS — Q041 Arhinencephaly: Secondary | ICD-10-CM | POA: Diagnosis not present

## 2016-01-16 NOTE — Telephone Encounter (Signed)
No need. Thx 

## 2016-01-17 DIAGNOSIS — Q041 Arhinencephaly: Secondary | ICD-10-CM | POA: Diagnosis not present

## 2016-01-17 NOTE — Telephone Encounter (Signed)
[facility] informed of PCP's response.

## 2016-01-18 DIAGNOSIS — Q041 Arhinencephaly: Secondary | ICD-10-CM | POA: Diagnosis not present

## 2016-01-21 DIAGNOSIS — Q041 Arhinencephaly: Secondary | ICD-10-CM | POA: Diagnosis not present

## 2016-01-22 DIAGNOSIS — Q041 Arhinencephaly: Secondary | ICD-10-CM | POA: Diagnosis not present

## 2016-01-23 DIAGNOSIS — Q041 Arhinencephaly: Secondary | ICD-10-CM | POA: Diagnosis not present

## 2016-01-24 DIAGNOSIS — Q041 Arhinencephaly: Secondary | ICD-10-CM | POA: Diagnosis not present

## 2016-01-25 DIAGNOSIS — Q041 Arhinencephaly: Secondary | ICD-10-CM | POA: Diagnosis not present

## 2016-01-28 DIAGNOSIS — Q041 Arhinencephaly: Secondary | ICD-10-CM | POA: Diagnosis not present

## 2016-01-29 DIAGNOSIS — Q041 Arhinencephaly: Secondary | ICD-10-CM | POA: Diagnosis not present

## 2016-01-30 DIAGNOSIS — Q041 Arhinencephaly: Secondary | ICD-10-CM | POA: Diagnosis not present

## 2016-01-31 DIAGNOSIS — Q041 Arhinencephaly: Secondary | ICD-10-CM | POA: Diagnosis not present

## 2016-02-01 ENCOUNTER — Encounter: Payer: Self-pay | Admitting: Gastroenterology

## 2016-02-01 DIAGNOSIS — Q041 Arhinencephaly: Secondary | ICD-10-CM | POA: Diagnosis not present

## 2016-02-04 DIAGNOSIS — Q041 Arhinencephaly: Secondary | ICD-10-CM | POA: Diagnosis not present

## 2016-02-05 DIAGNOSIS — Q041 Arhinencephaly: Secondary | ICD-10-CM | POA: Diagnosis not present

## 2016-02-06 DIAGNOSIS — Q041 Arhinencephaly: Secondary | ICD-10-CM | POA: Diagnosis not present

## 2016-02-07 DIAGNOSIS — Q041 Arhinencephaly: Secondary | ICD-10-CM | POA: Diagnosis not present

## 2016-02-08 DIAGNOSIS — Q041 Arhinencephaly: Secondary | ICD-10-CM | POA: Diagnosis not present

## 2016-02-18 DIAGNOSIS — Q041 Arhinencephaly: Secondary | ICD-10-CM | POA: Diagnosis not present

## 2016-02-19 DIAGNOSIS — Q041 Arhinencephaly: Secondary | ICD-10-CM | POA: Diagnosis not present

## 2016-02-20 DIAGNOSIS — Q041 Arhinencephaly: Secondary | ICD-10-CM | POA: Diagnosis not present

## 2016-02-21 DIAGNOSIS — Q041 Arhinencephaly: Secondary | ICD-10-CM | POA: Diagnosis not present

## 2016-02-22 ENCOUNTER — Other Ambulatory Visit (INDEPENDENT_AMBULATORY_CARE_PROVIDER_SITE_OTHER): Payer: Medicare Other

## 2016-02-22 DIAGNOSIS — E785 Hyperlipidemia, unspecified: Secondary | ICD-10-CM

## 2016-02-22 DIAGNOSIS — Q041 Arhinencephaly: Secondary | ICD-10-CM | POA: Diagnosis not present

## 2016-02-22 LAB — HEPATIC FUNCTION PANEL
ALT: 28 U/L (ref 0–35)
AST: 21 U/L (ref 0–37)
Albumin: 3.7 g/dL (ref 3.5–5.2)
Alkaline Phosphatase: 95 U/L (ref 39–117)
BILIRUBIN DIRECT: 0 mg/dL (ref 0.0–0.3)
BILIRUBIN TOTAL: 0.4 mg/dL (ref 0.2–1.2)
Total Protein: 6.5 g/dL (ref 6.0–8.3)

## 2016-02-22 LAB — LIPID PANEL
CHOLESTEROL: 179 mg/dL (ref 0–200)
HDL: 52 mg/dL (ref >39.00)
LDL CALC: 108 mg/dL — AB (ref 0–99)
NONHDL: 127.47
Total CHOL/HDL Ratio: 3
Triglycerides: 99 mg/dL (ref 0.0–149.0)
VLDL: 19.8 mg/dL (ref 0.0–40.0)

## 2016-02-22 LAB — BASIC METABOLIC PANEL
BUN: 14 mg/dL (ref 6–23)
CO2: 29 meq/L (ref 19–32)
Calcium: 8.6 mg/dL (ref 8.4–10.5)
Chloride: 106 mEq/L (ref 96–112)
Creatinine, Ser: 0.74 mg/dL (ref 0.40–1.20)
GFR: 80.27 mL/min (ref >60.00)
GLUCOSE: 99 mg/dL (ref 70–99)
POTASSIUM: 4.1 meq/L (ref 3.5–5.1)
SODIUM: 138 meq/L (ref 135–145)

## 2016-02-27 DIAGNOSIS — H01002 Unspecified blepharitis right lower eyelid: Secondary | ICD-10-CM | POA: Diagnosis not present

## 2016-02-27 DIAGNOSIS — Z961 Presence of intraocular lens: Secondary | ICD-10-CM | POA: Diagnosis not present

## 2016-02-27 DIAGNOSIS — H01001 Unspecified blepharitis right upper eyelid: Secondary | ICD-10-CM | POA: Diagnosis not present

## 2016-02-27 DIAGNOSIS — H01005 Unspecified blepharitis left lower eyelid: Secondary | ICD-10-CM | POA: Diagnosis not present

## 2016-02-27 DIAGNOSIS — H01004 Unspecified blepharitis left upper eyelid: Secondary | ICD-10-CM | POA: Diagnosis not present

## 2016-03-03 ENCOUNTER — Encounter: Payer: Self-pay | Admitting: Internal Medicine

## 2016-03-03 ENCOUNTER — Ambulatory Visit (INDEPENDENT_AMBULATORY_CARE_PROVIDER_SITE_OTHER): Payer: Medicare Other | Admitting: Internal Medicine

## 2016-03-03 VITALS — BP 124/70 | HR 63 | Wt 143.0 lb

## 2016-03-03 DIAGNOSIS — F411 Generalized anxiety disorder: Secondary | ICD-10-CM

## 2016-03-03 DIAGNOSIS — K219 Gastro-esophageal reflux disease without esophagitis: Secondary | ICD-10-CM

## 2016-03-03 DIAGNOSIS — I10 Essential (primary) hypertension: Secondary | ICD-10-CM

## 2016-03-03 DIAGNOSIS — K8689 Other specified diseases of pancreas: Secondary | ICD-10-CM

## 2016-03-03 DIAGNOSIS — E785 Hyperlipidemia, unspecified: Secondary | ICD-10-CM | POA: Diagnosis not present

## 2016-03-03 MED ORDER — LORAZEPAM 1 MG PO TABS: 1.0000 mg | ORAL_TABLET | Freq: Every evening | ORAL | Status: DC | PRN

## 2016-03-03 MED ORDER — PANCRELIPASE (LIP-PROT-AMYL) 24000-76000 UNITS PO CPEP: ORAL_CAPSULE | ORAL | Status: DC

## 2016-03-03 MED ORDER — CICLOPIROX 8 % EX SOLN: Freq: Every day | CUTANEOUS | Status: DC

## 2016-03-03 NOTE — Assessment & Plan Note (Signed)
Prilosec 

## 2016-03-03 NOTE — Progress Notes (Signed)
Pre visit review using our clinic review tool, if applicable. No additional management support is needed unless otherwise documented below in the visit note. 

## 2016-03-03 NOTE — Assessment & Plan Note (Signed)
On Zetia 

## 2016-03-03 NOTE — Assessment & Plan Note (Signed)
Chronic Lorazepam  Potential benefits of a long term benzodiazepines  use as well as potential risks  and complications were explained to the patient and were aknowledged.

## 2016-03-03 NOTE — Progress Notes (Signed)
Subjective:  [name], female    DOB: 27-Dec-1933  Age: 82 y.o. MRN: [id number]  CC: No chief complaint on file.   HPI Sophia Santos presents for HTN, dyslipidemia, GERD f/u. Pt declined vaccines  Outpatient Prescriptions Prior to Visit  Medication Sig Dispense Refill  . butalbital-acetaminophen-caffeine (FIORICET) 50-325-40 MG tablet Take 1-2 tablets by mouth 2 (two) times daily as needed for headache. 90 tablet 1  . captopril (CAPOTEN) 100 MG tablet Take 1 tablet (100 mg total) by mouth 3 (three) times daily. 270 tablet 3  . Cholecalciferol 1000 UNITS tablet Take 1,000 Units by mouth daily.      Marland Kitchen conjugated estrogens (PREMARIN) vaginal cream Use pv qod x 1 week, then pv q1wk 42.5 g 12  . dipyridamole-aspirin (AGGRENOX) 200-25 MG per 12 hr capsule TAKE 1 CAPSULE BY MOUTH 2 (TWO) TIMES DAILY. 180 capsule 3  . ezetimibe (ZETIA) 10 MG tablet Take 1 tablet (10 mg total) by mouth daily. 90 tablet 3  . meloxicam (MOBIC) 7.5 MG tablet Take 1 tablet (7.5 mg total) by mouth daily. 30 tablet 3  . omeprazole (PRILOSEC) 20 MG capsule Take 2 capsules (40 mg total) by mouth daily. 180 capsule 3  . propranolol ER (INDERAL LA) 120 MG 24 hr capsule Take 1 capsule (120 mg total) by mouth every morning. 90 capsule 3  . RESTASIS 0.05 % ophthalmic emulsion     . topiramate (TOPAMAX) 50 MG tablet Take 50 mg by mouth daily.  1  . ciclopirox (PENLAC) 8 % solution Apply topically at bedtime. Apply over nail and surrounding skin. Apply daily over previous coat. After seven (7) days, may remove with alcohol and continue cycle. 6.6 mL 2  . LORazepam (ATIVAN) 1 MG tablet Take 1-2 tablets (1-2 mg total) by mouth at bedtime as needed for sleep. 60 tablet 5  . Pancrelipase, Lip-Prot-Amyl, 24000 UNITS CPEP 1 po w/food tid 90 capsule 3   No facility-administered medications prior to visit.    ROS Review of Systems  Constitutional: Negative for chills, activity change, appetite change, fatigue and  unexpected weight change.  HENT: Negative for congestion, mouth sores and sinus pressure.   Eyes: Negative for visual disturbance.  Respiratory: Negative for cough and chest tightness.   Gastrointestinal: Negative for nausea and abdominal pain.  Genitourinary: Negative for frequency, difficulty urinating and vaginal pain.  Musculoskeletal: Negative for back pain and gait problem.  Skin: Negative for pallor and rash.  Neurological: Negative for dizziness, tremors, weakness, numbness and headaches.  Psychiatric/Behavioral: Positive for decreased concentration. Negative for suicidal ideas, confusion and sleep disturbance. The patient is not nervous/anxious.     Objective:  BP 124/70 mmHg  Pulse 63  Wt 143 lb (64.864 kg)  SpO2 95%  BP Readings from Last 3 Encounters:  03/03/16 124/70  12/03/15 138/80  09/18/15 150/87    Wt Readings from Last 3 Encounters:  03/03/16 143 lb (64.864 kg)  12/03/15 143 lb (64.864 kg)  09/18/15 148 lb (67.132 kg)    Physical Exam  Constitutional: She appears well-developed. No distress.  HENT:  Head: Normocephalic.  Right Ear: External ear normal.  Left Ear: External ear normal.  Nose: Nose normal.  Mouth/Throat: Oropharynx is clear and moist.  Eyes: Conjunctivae are normal. Pupils are equal, round, and reactive to light. Right eye exhibits no discharge. Left eye exhibits no discharge.  Neck: Normal range of motion. Neck supple. No JVD present. No tracheal deviation present. No thyromegaly present.  Cardiovascular: Normal  rate, regular rhythm and normal heart sounds.   Pulmonary/Chest: No stridor. No respiratory distress. She has no wheezes.  Abdominal: Soft. Bowel sounds are normal. She exhibits no distension and no mass. There is no tenderness. There is no rebound and no guarding.  Musculoskeletal: She exhibits no edema or tenderness.  Lymphadenopathy:    She has no cervical adenopathy.  Neurological: She displays normal reflexes. No cranial  nerve deficit. She exhibits normal muscle tone. Coordination normal.  Skin: No rash noted. No erythema.  Psychiatric: She has a normal mood and affect. Her behavior is normal. Judgment and thought content normal.    Lab Results  Component Value Date   WBC 5.5 07/26/2015   HGB 11.4* 07/26/2015   HCT 34.2* 07/26/2015   PLT 248.0 07/26/2015   GLUCOSE 99 02/22/2016   CHOL 179 02/22/2016   TRIG 99.0 02/22/2016   HDL 52.00 02/22/2016   LDLDIRECT 165.2 05/25/2013   LDLCALC 108* 02/22/2016   ALT 28 02/22/2016   AST 21 02/22/2016   NA 138 02/22/2016   K 4.1 02/22/2016   CL 106 02/22/2016   CREATININE 0.74 02/22/2016   BUN 14 02/22/2016   CO2 29 02/22/2016   TSH 1.35 07/26/2015   HGBA1C 5.9 09/29/2007    No results found.  Assessment & Plan:   Diagnoses and all orders for this visit:  Anxiety state  Dyslipidemia  Essential hypertension  Gastroesophageal reflux disease without esophagitis  Other orders -     ciclopirox (PENLAC) 8 % solution; Apply topically at bedtime. Apply over nail and surrounding skin. Apply daily over previous coat. After seven (7) days, may remove with alcohol and continue cycle. -     Pancrelipase, Lip-Prot-Amyl, 24000 units CPEP; 1 po w/food tid -     LORazepam (ATIVAN) 1 MG tablet; Take 1-2 tablets (1-2 mg total) by mouth at bedtime as needed for sleep.   I am having Sophia Santos maintain her Cholecalciferol, conjugated estrogens, topiramate, RESTASIS, captopril, propranolol ER, omeprazole, dipyridamole-aspirin, meloxicam, butalbital-acetaminophen-caffeine, ezetimibe, ciclopirox, Pancrelipase (Lip-Prot-Amyl), and LORazepam.  Meds ordered this encounter  Medications  . ciclopirox (PENLAC) 8 % solution    Sig: Apply topically at bedtime. Apply over nail and surrounding skin. Apply daily over previous coat. After seven (7) days, may remove with alcohol and continue cycle.    Dispense:  6.6 mL    Refill:  2  . Pancrelipase, Lip-Prot-Amyl, 24000 units  CPEP    Sig: 1 po w/food tid    Dispense:  90 capsule    Refill:  3  . LORazepam (ATIVAN) 1 MG tablet    Sig: Take 1-2 tablets (1-2 mg total) by mouth at bedtime as needed for sleep.    Dispense:  60 tablet    Refill:  5     Follow-up: Return in about 4 months (around 07/03/2016) for a follow-up visit.  Walker Kehr, MD

## 2016-03-03 NOTE — Assessment & Plan Note (Signed)
Chronic Captopril Risks associated with treatment noncompliance were discussed. Compliance was encouraged.  

## 2016-03-03 NOTE — Assessment & Plan Note (Signed)
Mild  Creon

## 2016-03-04 DIAGNOSIS — N814 Uterovaginal prolapse, unspecified: Secondary | ICD-10-CM | POA: Diagnosis not present

## 2016-03-04 DIAGNOSIS — N3281 Overactive bladder: Secondary | ICD-10-CM | POA: Diagnosis not present

## 2016-04-07 ENCOUNTER — Other Ambulatory Visit: Payer: Self-pay | Admitting: Internal Medicine

## 2016-04-21 ENCOUNTER — Other Ambulatory Visit: Payer: Self-pay | Admitting: Dermatology

## 2016-04-21 DIAGNOSIS — D0439 Carcinoma in situ of skin of other parts of face: Secondary | ICD-10-CM | POA: Diagnosis not present

## 2016-04-21 DIAGNOSIS — C44329 Squamous cell carcinoma of skin of other parts of face: Secondary | ICD-10-CM | POA: Diagnosis not present

## 2016-05-22 DIAGNOSIS — D0439 Carcinoma in situ of skin of other parts of face: Secondary | ICD-10-CM | POA: Diagnosis not present

## 2016-05-28 DIAGNOSIS — H01001 Unspecified blepharitis right upper eyelid: Secondary | ICD-10-CM | POA: Diagnosis not present

## 2016-05-28 DIAGNOSIS — H01005 Unspecified blepharitis left lower eyelid: Secondary | ICD-10-CM | POA: Diagnosis not present

## 2016-05-28 DIAGNOSIS — Z961 Presence of intraocular lens: Secondary | ICD-10-CM | POA: Diagnosis not present

## 2016-05-28 DIAGNOSIS — H01004 Unspecified blepharitis left upper eyelid: Secondary | ICD-10-CM | POA: Diagnosis not present

## 2016-05-28 DIAGNOSIS — H01002 Unspecified blepharitis right lower eyelid: Secondary | ICD-10-CM | POA: Diagnosis not present

## 2016-06-05 ENCOUNTER — Other Ambulatory Visit: Payer: Self-pay | Admitting: Internal Medicine

## 2016-06-06 DIAGNOSIS — R300 Dysuria: Secondary | ICD-10-CM | POA: Diagnosis not present

## 2016-06-06 DIAGNOSIS — N814 Uterovaginal prolapse, unspecified: Secondary | ICD-10-CM | POA: Diagnosis not present

## 2016-06-18 DIAGNOSIS — G43019 Migraine without aura, intractable, without status migrainosus: Secondary | ICD-10-CM | POA: Diagnosis not present

## 2016-06-18 DIAGNOSIS — G43719 Chronic migraine without aura, intractable, without status migrainosus: Secondary | ICD-10-CM | POA: Diagnosis not present

## 2016-07-03 ENCOUNTER — Encounter: Payer: Medicare Other | Admitting: Internal Medicine

## 2016-07-04 ENCOUNTER — Encounter: Payer: Self-pay | Admitting: Internal Medicine

## 2016-07-04 ENCOUNTER — Ambulatory Visit (INDEPENDENT_AMBULATORY_CARE_PROVIDER_SITE_OTHER): Payer: Medicare Other | Admitting: Internal Medicine

## 2016-07-04 VITALS — BP 128/74 | HR 66 | Ht 63.0 in | Wt 140.0 lb

## 2016-07-04 DIAGNOSIS — G4700 Insomnia, unspecified: Secondary | ICD-10-CM | POA: Diagnosis not present

## 2016-07-04 DIAGNOSIS — E785 Hyperlipidemia, unspecified: Secondary | ICD-10-CM | POA: Diagnosis not present

## 2016-07-04 DIAGNOSIS — Z0000 Encounter for general adult medical examination without abnormal findings: Secondary | ICD-10-CM

## 2016-07-04 DIAGNOSIS — M5442 Lumbago with sciatica, left side: Secondary | ICD-10-CM

## 2016-07-04 DIAGNOSIS — F411 Generalized anxiety disorder: Secondary | ICD-10-CM

## 2016-07-04 DIAGNOSIS — K5901 Slow transit constipation: Secondary | ICD-10-CM

## 2016-07-04 DIAGNOSIS — I10 Essential (primary) hypertension: Secondary | ICD-10-CM | POA: Diagnosis not present

## 2016-07-04 MED ORDER — ASPIRIN-DIPYRIDAMOLE ER 25-200 MG PO CP12: 1.0000 | ORAL_CAPSULE | Freq: Two times a day (BID) | ORAL | Status: DC

## 2016-07-04 MED ORDER — LINACLOTIDE 290 MCG PO CAPS: 290.0000 ug | ORAL_CAPSULE | Freq: Every day | ORAL | Status: DC

## 2016-07-04 MED ORDER — CAPTOPRIL 100 MG PO TABS: 100.0000 mg | ORAL_TABLET | Freq: Two times a day (BID) | ORAL | Status: DC

## 2016-07-04 NOTE — Assessment & Plan Note (Signed)
Meloxicam prn 

## 2016-07-04 NOTE — Assessment & Plan Note (Signed)
Captopril Risks associated with treatment noncompliance were discussed. Compliance was encouraged. 

## 2016-07-04 NOTE — Assessment & Plan Note (Signed)
2017 try Linzess

## 2016-07-04 NOTE — Assessment & Plan Note (Signed)
Lorazepam prn  Potential benefits of a long term benzodiazepines  use as well as potential risks  and complications were explained to the patient and were aknowledged.  

## 2016-07-04 NOTE — Assessment & Plan Note (Signed)
Zetia

## 2016-07-04 NOTE — Progress Notes (Signed)
Pre visit review using our clinic review tool, if applicable. No additional management support is needed unless otherwise documented below in the visit note. 

## 2016-07-04 NOTE — Progress Notes (Signed)
Subjective:  [name], female    DOB: 1934-08-29  Age: 82 y.o. MRN: [id number]  CC: No chief complaint on file.   HPI Sophia Santos presents for HTN, dyslipidemia, anxiety f/u  Outpatient Prescriptions Prior to Visit  Medication Sig Dispense Refill  . butalbital-acetaminophen-caffeine (FIORICET) 50-325-40 MG tablet Take 1-2 tablets by mouth 2 (two) times daily as needed for headache. 90 tablet 1  . captopril (CAPOTEN) 100 MG tablet Take 1 tablet (100 mg total) by mouth 3 (three) times daily. 270 tablet 3  . Cholecalciferol 1000 UNITS tablet Take 1,000 Units by mouth daily.      . ciclopirox (PENLAC) 8 % solution APPLY TOPICALLY AT BEDTIME OVER NAIL AND SURROUNDING SKIN. APPLY DAILY OVER PREVIOUS COAT. 6.6 mL 2  . conjugated estrogens (PREMARIN) vaginal cream Use pv qod x 1 week, then pv q1wk 42.5 g 12  . dipyridamole-aspirin (AGGRENOX) 200-25 MG 12hr capsule Take 1 capsule by mouth 2 (two) times daily. 180 capsule 3  . ezetimibe (ZETIA) 10 MG tablet Take 1 tablet (10 mg total) by mouth daily. 90 tablet 3  . LORazepam (ATIVAN) 1 MG tablet Take 1-2 tablets (1-2 mg total) by mouth at bedtime as needed for sleep. 60 tablet 5  . meloxicam (MOBIC) 7.5 MG tablet Take 1 tablet (7.5 mg total) by mouth daily. 30 tablet 3  . omeprazole (PRILOSEC) 20 MG capsule Take 2 capsules (40 mg total) by mouth daily. 180 capsule 3  . Pancrelipase, Lip-Prot-Amyl, 24000 units CPEP 1 po w/food tid 90 capsule 3  . propranolol ER (INDERAL LA) 120 MG 24 hr capsule Take 1 capsule (120 mg total) by mouth daily. 90 capsule 3  . RESTASIS 0.05 % ophthalmic emulsion     . topiramate (TOPAMAX) 50 MG tablet Take 50 mg by mouth daily.  1   No facility-administered medications prior to visit.    ROS Review of Systems  Constitutional: Negative for chills, activity change, appetite change, fatigue and unexpected weight change.  HENT: Negative for congestion, mouth sores and sinus pressure.   Eyes: Negative  for visual disturbance.  Respiratory: Negative for cough and chest tightness.   Gastrointestinal: Negative for nausea and abdominal pain.  Genitourinary: Negative for frequency, difficulty urinating and vaginal pain.  Musculoskeletal: Negative for back pain and gait problem.  Skin: Negative for pallor and rash.  Neurological: Negative for dizziness, tremors, weakness, numbness and headaches.  Psychiatric/Behavioral: Negative for suicidal ideas, confusion and sleep disturbance. The patient is nervous/anxious.     Objective:  BP 128/74 mmHg  Pulse 66  Ht 5\' 3"  (1.6 m)  Wt 140 lb (63.504 kg)  BMI 24.81 kg/m2  SpO2 96%  BP Readings from Last 3 Encounters:  07/04/16 128/74  03/03/16 124/70  12/03/15 138/80    Wt Readings from Last 3 Encounters:  07/04/16 140 lb (63.504 kg)  03/03/16 143 lb (64.864 kg)  12/03/15 143 lb (64.864 kg)    Physical Exam  Constitutional: She appears well-developed. No distress.  HENT:  Head: Normocephalic.  Right Ear: External ear normal.  Left Ear: External ear normal.  Nose: Nose normal.  Mouth/Throat: Oropharynx is clear and moist.  Eyes: Conjunctivae are normal. Pupils are equal, round, and reactive to light. Right eye exhibits no discharge. Left eye exhibits no discharge.  Neck: Normal range of motion. Neck supple. No JVD present. No tracheal deviation present. No thyromegaly present.  Cardiovascular: Normal rate, regular rhythm and normal heart sounds.   Pulmonary/Chest: No stridor. No  respiratory distress. She has no wheezes.  Abdominal: Soft. Bowel sounds are normal. She exhibits no distension and no mass. There is no tenderness. There is no rebound and no guarding.  Musculoskeletal: She exhibits no edema or tenderness.  Lymphadenopathy:    She has no cervical adenopathy.  Neurological: She displays normal reflexes. No cranial nerve deficit. She exhibits normal muscle tone. Coordination normal.  Skin: No rash noted. No erythema.    Psychiatric: She has a normal mood and affect. Her behavior is normal. Judgment and thought content normal.    Lab Results  Component Value Date   WBC 5.5 07/26/2015   HGB 11.4* 07/26/2015   HCT 34.2* 07/26/2015   PLT 248.0 07/26/2015   GLUCOSE 99 02/22/2016   CHOL 179 02/22/2016   TRIG 99.0 02/22/2016   HDL 52.00 02/22/2016   LDLDIRECT 165.2 05/25/2013   LDLCALC 108* 02/22/2016   ALT 28 02/22/2016   AST 21 02/22/2016   NA 138 02/22/2016   K 4.1 02/22/2016   CL 106 02/22/2016   CREATININE 0.74 02/22/2016   BUN 14 02/22/2016   CO2 29 02/22/2016   TSH 1.35 07/26/2015   HGBA1C 5.9 09/29/2007    No results found.  Assessment & Plan:   There are no diagnoses linked to this encounter. I am having Sophia Santos maintain her Cholecalciferol, conjugated estrogens, topiramate, RESTASIS, captopril, meloxicam, butalbital-acetaminophen-caffeine, ezetimibe, Pancrelipase (Lip-Prot-Amyl), LORazepam, dipyridamole-aspirin, omeprazole, propranolol ER, and ciclopirox.  No orders of the defined types were placed in this encounter.     Follow-up: No Follow-up on file.  Walker Kehr, MD

## 2016-08-27 DIAGNOSIS — R1084 Generalized abdominal pain: Secondary | ICD-10-CM | POA: Diagnosis not present

## 2016-09-01 ENCOUNTER — Other Ambulatory Visit: Payer: Self-pay | Admitting: Internal Medicine

## 2016-09-02 DIAGNOSIS — G43719 Chronic migraine without aura, intractable, without status migrainosus: Secondary | ICD-10-CM | POA: Diagnosis not present

## 2016-09-02 DIAGNOSIS — G43019 Migraine without aura, intractable, without status migrainosus: Secondary | ICD-10-CM | POA: Diagnosis not present

## 2016-09-03 ENCOUNTER — Ambulatory Visit (INDEPENDENT_AMBULATORY_CARE_PROVIDER_SITE_OTHER): Payer: Medicare Other | Admitting: Internal Medicine

## 2016-09-03 ENCOUNTER — Encounter: Payer: Self-pay | Admitting: Internal Medicine

## 2016-09-03 ENCOUNTER — Other Ambulatory Visit (INDEPENDENT_AMBULATORY_CARE_PROVIDER_SITE_OTHER): Payer: Medicare Other

## 2016-09-03 ENCOUNTER — Ambulatory Visit (INDEPENDENT_AMBULATORY_CARE_PROVIDER_SITE_OTHER)
Admission: RE | Admit: 2016-09-03 | Discharge: 2016-09-03 | Disposition: A | Discharge status: Home / Self Care | Payer: Medicare Other | Source: Ambulatory Visit | Attending: Internal Medicine | Admitting: Internal Medicine

## 2016-09-03 VITALS — BP 130/76 | HR 55 | Temp 97.9°F | Wt 139.0 lb

## 2016-09-03 DIAGNOSIS — I10 Essential (primary) hypertension: Secondary | ICD-10-CM

## 2016-09-03 DIAGNOSIS — R1030 Lower abdominal pain, unspecified: Secondary | ICD-10-CM

## 2016-09-03 DIAGNOSIS — Z961 Presence of intraocular lens: Secondary | ICD-10-CM | POA: Diagnosis not present

## 2016-09-03 DIAGNOSIS — K5901 Slow transit constipation: Secondary | ICD-10-CM

## 2016-09-03 DIAGNOSIS — Z23 Encounter for immunization: Secondary | ICD-10-CM | POA: Diagnosis not present

## 2016-09-03 DIAGNOSIS — H01005 Unspecified blepharitis left lower eyelid: Secondary | ICD-10-CM | POA: Diagnosis not present

## 2016-09-03 DIAGNOSIS — H01001 Unspecified blepharitis right upper eyelid: Secondary | ICD-10-CM | POA: Diagnosis not present

## 2016-09-03 DIAGNOSIS — H01004 Unspecified blepharitis left upper eyelid: Secondary | ICD-10-CM | POA: Diagnosis not present

## 2016-09-03 DIAGNOSIS — H01002 Unspecified blepharitis right lower eyelid: Secondary | ICD-10-CM | POA: Diagnosis not present

## 2016-09-03 LAB — URINALYSIS, ROUTINE W REFLEX MICROSCOPIC
Bilirubin Urine: NEGATIVE
HGB URINE DIPSTICK: NEGATIVE
Ketones, ur: NEGATIVE
Nitrite: NEGATIVE
Specific Gravity, Urine: <=1.005 — AB (ref 1.000–1.030)
Total Protein, Urine: NEGATIVE
Urine Glucose: NEGATIVE
Urobilinogen, UA: 0.2 (ref 0.0–1.0)
pH: 7 (ref 5.0–8.0)

## 2016-09-03 LAB — CBC WITH DIFFERENTIAL/PLATELET
BASOS ABS: 0 10*3/uL (ref 0.0–0.1)
Basophils Relative: 0.4 % (ref 0.0–3.0)
Eosinophils Absolute: 0.1 10*3/uL (ref 0.0–0.7)
Eosinophils Relative: 1.6 % (ref 0.0–5.0)
HEMATOCRIT: 33.8 % — AB (ref 36.0–46.0)
Hemoglobin: 11.3 g/dL — ABNORMAL LOW (ref 12.0–15.0)
Lymphocytes Relative: 50 % — ABNORMAL HIGH (ref 12.0–46.0)
Lymphs Abs: 2.7 10*3/uL (ref 0.7–4.0)
MCHC: 33.4 g/dL (ref 30.0–36.0)
MCV: 95.3 fl (ref 78.0–100.0)
Monocytes Absolute: 0.6 10*3/uL (ref 0.1–1.0)
Monocytes Relative: 10.4 % (ref 3.0–12.0)
Neutro Abs: 2 10*3/uL (ref 1.4–7.7)
Neutrophils Relative %: 37.6 % — ABNORMAL LOW (ref 43.0–77.0)
Platelets: 227 10*3/uL (ref 150.0–400.0)
RBC: 3.55 Mil/uL — AB (ref 3.87–5.11)
RDW: 13.3 % (ref 11.5–15.5)
WBC: 5.4 10*3/uL (ref 4.0–10.5)

## 2016-09-03 LAB — BASIC METABOLIC PANEL
BUN: 14 mg/dL (ref 6–23)
CALCIUM: 8.7 mg/dL (ref 8.4–10.5)
CO2: 29 mEq/L (ref 19–32)
CREATININE: 0.72 mg/dL (ref 0.40–1.20)
Chloride: 104 mEq/L (ref 96–112)
GFR: 82.74 mL/min (ref >60.00)
Glucose, Bld: 89 mg/dL (ref 70–99)
Potassium: 4.5 mEq/L (ref 3.5–5.1)
Sodium: 134 mEq/L — ABNORMAL LOW (ref 135–145)

## 2016-09-03 LAB — HEPATIC FUNCTION PANEL
ALK PHOS: 69 U/L (ref 39–117)
ALT: 15 U/L (ref 0–35)
AST: 12 U/L (ref 0–37)
Albumin: 3.8 g/dL (ref 3.5–5.2)
BILIRUBIN DIRECT: 0 mg/dL (ref 0.0–0.3)
BILIRUBIN TOTAL: 0.3 mg/dL (ref 0.2–1.2)
TOTAL PROTEIN: 6.7 g/dL (ref 6.0–8.3)

## 2016-09-03 LAB — SEDIMENTATION RATE: Sed Rate: 12 mm/hr (ref 0–30)

## 2016-09-03 MED ORDER — POLYETHYLENE GLYCOL 3350 17 GM/SCOOP PO POWD: 17.0000 g | Freq: Two times a day (BID) | ORAL | 11 refills | Status: DC | PRN

## 2016-09-03 MED ORDER — LUBIPROSTONE 24 MCG PO CAPS: 24.0000 ug | ORAL_CAPSULE | Freq: Two times a day (BID) | ORAL | 11 refills | Status: AC | PRN

## 2016-09-03 MED ORDER — AMOXICILLIN-POT CLAVULANATE 875-125 MG PO TABS: 1.0000 | ORAL_TABLET | Freq: Two times a day (BID) | ORAL | 0 refills | Status: DC

## 2016-09-03 NOTE — Progress Notes (Signed)
Subjective:  [name], female    DOB: Jun 19, 1934  Age: 81 y.o. MRN: [id number]  CC: Abdominal Pain   HPI Sophia Santos presents for abd pain in the lower abd x 3 week -- 5/10 constant. Not related to meals. No n/v. Took Tylenol - helps a little  Outpatient Medications Prior to Visit  Medication Sig Dispense Refill  . butalbital-acetaminophen-caffeine (FIORICET) 50-325-40 MG tablet Take 1-2 tablets by mouth 2 (two) times daily as needed for headache. 90 tablet 1  . captopril (CAPOTEN) 100 MG tablet Take 1 tablet (100 mg total) by mouth 2 (two) times daily. 180 tablet 3  . Cholecalciferol 1000 UNITS tablet Take 1,000 Units by mouth daily.      . ciclopirox (PENLAC) 8 % solution APPLY TOPICALLY AT BEDTIME OVER NAIL AND SURROUNDING SKIN. APPLY DAILY OVER PREVIOUS COAT. 6.6 mL 2  . conjugated estrogens (PREMARIN) vaginal cream Use pv qod x 1 week, then pv q1wk 42.5 g 12  . dipyridamole-aspirin (AGGRENOX) 200-25 MG 12hr capsule Take 1 capsule by mouth 2 (two) times daily. 180 capsule 3  . ezetimibe (ZETIA) 10 MG tablet Take 1 tablet (10 mg total) by mouth daily. 90 tablet 3  . linaclotide (LINZESS) 290 MCG CAPS capsule Take 1 capsule (290 mcg total) by mouth daily. 30 capsule 11  . LORazepam (ATIVAN) 1 MG tablet TAKE 1 TO 2 TABLETS BY MOUTH AT BEDTIME AS NEEDED FOR SLEEP 60 tablet 3  . meloxicam (MOBIC) 7.5 MG tablet Take 1 tablet (7.5 mg total) by mouth daily. 30 tablet 3  . omeprazole (PRILOSEC) 20 MG capsule Take 2 capsules (40 mg total) by mouth daily. 180 capsule 3  . Pancrelipase, Lip-Prot-Amyl, 24000 units CPEP 1 po w/food tid 90 capsule 3  . propranolol ER (INDERAL LA) 120 MG 24 hr capsule Take 1 capsule (120 mg total) by mouth daily. 90 capsule 3  . RESTASIS 0.05 % ophthalmic emulsion     . topiramate (TOPAMAX) 50 MG tablet Take 50 mg by mouth daily.  1   No facility-administered medications prior to visit.     ROS Review of Systems  Constitutional: Negative for  activity change, appetite change, chills, fatigue and unexpected weight change.  HENT: Negative for congestion, mouth sores and sinus pressure.   Eyes: Negative for visual disturbance.  Respiratory: Negative for cough and chest tightness.   Gastrointestinal: Positive for abdominal pain. Negative for blood in stool, diarrhea, nausea, rectal pain and vomiting.  Genitourinary: Negative for difficulty urinating, frequency and vaginal pain.  Musculoskeletal: Negative for back pain and gait problem.  Skin: Negative for pallor and rash.  Neurological: Negative for dizziness, tremors, weakness, numbness and headaches.  Psychiatric/Behavioral: Negative for confusion and sleep disturbance.    Objective:  BP 130/76   Pulse (!) 55   Temp 97.9 F (36.6 C) (Oral)   Wt 139 lb (63 kg)   SpO2 98%   BMI 24.62 kg/m   BP Readings from Last 3 Encounters:  09/03/16 130/76  07/04/16 128/74  03/03/16 124/70    Wt Readings from Last 3 Encounters:  09/03/16 139 lb (63 kg)  07/04/16 140 lb (63.5 kg)  03/03/16 143 lb (64.9 kg)    Physical Exam  Constitutional: She appears well-developed. No distress.  HENT:  Head: Normocephalic.  Right Ear: External ear normal.  Left Ear: External ear normal.  Nose: Nose normal.  Mouth/Throat: Oropharynx is clear and moist.  Eyes: Conjunctivae are normal. Pupils are equal, round, and reactive  to light. Right eye exhibits no discharge. Left eye exhibits no discharge.  Neck: Normal range of motion. Neck supple. No JVD present. No tracheal deviation present. No thyromegaly present.  Cardiovascular: Normal rate, regular rhythm and normal heart sounds.   Pulmonary/Chest: No stridor. No respiratory distress. She has no wheezes.  Abdominal: Soft. Bowel sounds are normal. She exhibits no distension and no mass. There is no tenderness. There is no rebound and no guarding.  Musculoskeletal: She exhibits no edema or tenderness.  Lymphadenopathy:    She has no cervical  adenopathy.  Neurological: She displays normal reflexes. No cranial nerve deficit. She exhibits normal muscle tone. Coordination normal.  Skin: No rash noted. No erythema.  Psychiatric: She has a normal mood and affect. Her behavior is normal. Judgment and thought content normal.  sensitive in LLQ and suprapubic area; no mass  Lab Results  Component Value Date   WBC 5.5 07/26/2015   HGB 11.4 (L) 07/26/2015   HCT 34.2 (L) 07/26/2015   PLT 248.0 07/26/2015   GLUCOSE 99 02/22/2016   CHOL 179 02/22/2016   TRIG 99.0 02/22/2016   HDL 52.00 02/22/2016   LDLDIRECT 165.2 05/25/2013   LDLCALC 108 (H) 02/22/2016   ALT 28 02/22/2016   AST 21 02/22/2016   NA 138 02/22/2016   K 4.1 02/22/2016   CL 106 02/22/2016   CREATININE 0.74 02/22/2016   BUN 14 02/22/2016   CO2 29 02/22/2016   TSH 1.35 07/26/2015   HGBA1C 5.9 09/29/2007    No results found.  Assessment & Plan:   There are no diagnoses linked to this encounter. I am having Sophia Santos maintain her Cholecalciferol, conjugated estrogens, topiramate, RESTASIS, meloxicam, butalbital-acetaminophen-caffeine, ezetimibe, Pancrelipase (Lip-Prot-Amyl), omeprazole, propranolol ER, ciclopirox, captopril, dipyridamole-aspirin, linaclotide, and LORazepam.  No orders of the defined types were placed in this encounter.    Follow-up: No Follow-up on file.  Walker Kehr, MD

## 2016-09-03 NOTE — Progress Notes (Signed)
Pre visit review using our clinic review tool, if applicable. No additional management support is needed unless otherwise documented below in the visit note. 

## 2016-09-03 NOTE — Telephone Encounter (Signed)
Called refill into pharmacy spoke w/Kim gave md approval.../lmb

## 2016-09-03 NOTE — Assessment & Plan Note (Addendum)
Miralax  Amitiza

## 2016-09-03 NOTE — Addendum Note (Signed)
Addended by: Cresenciano Lick on: 09/03/2016 02:11 PM   Modules accepted: Orders

## 2016-09-03 NOTE — Assessment & Plan Note (Signed)
Captopril Risks associated with treatment noncompliance were discussed. Compliance was encouraged. 

## 2016-09-03 NOTE — Assessment & Plan Note (Signed)
9/17 diverticulitis vs other Colon 2016 Empiric abx CT if needed Labs

## 2016-09-18 ENCOUNTER — Ambulatory Visit (INDEPENDENT_AMBULATORY_CARE_PROVIDER_SITE_OTHER): Payer: Medicare Other | Admitting: Internal Medicine

## 2016-09-18 ENCOUNTER — Encounter: Payer: Self-pay | Admitting: Internal Medicine

## 2016-09-18 DIAGNOSIS — K5900 Constipation, unspecified: Secondary | ICD-10-CM

## 2016-09-18 DIAGNOSIS — R1010 Upper abdominal pain, unspecified: Secondary | ICD-10-CM | POA: Diagnosis not present

## 2016-09-18 DIAGNOSIS — K219 Gastro-esophageal reflux disease without esophagitis: Secondary | ICD-10-CM

## 2016-09-18 NOTE — Assessment & Plan Note (Signed)
Miralax, Amitiza prn

## 2016-09-18 NOTE — Assessment & Plan Note (Signed)
diverticulitis vs other - on abx, laxatives CT abd/pelvis

## 2016-09-18 NOTE — Assessment & Plan Note (Signed)
Prilosec 

## 2016-09-18 NOTE — Progress Notes (Signed)
Subjective:  [name], female    DOB: August 18, 1934  Age: 82 y.o. MRN: [id number]  CC: No chief complaint on file.   HPI Sophia Santos presents for abdominal pain, constipation - some better after a laxative. On abx now. No n/v. No fever    Outpatient Medications Prior to Visit  Medication Sig Dispense Refill  . amoxicillin-clavulanate (AUGMENTIN) 875-125 MG tablet Take 1 tablet by mouth 2 (two) times daily. 20 tablet 0  . butalbital-acetaminophen-caffeine (FIORICET) 50-325-40 MG tablet Take 1-2 tablets by mouth 2 (two) times daily as needed for headache. 90 tablet 1  . captopril (CAPOTEN) 100 MG tablet Take 1 tablet (100 mg total) by mouth 2 (two) times daily. 180 tablet 3  . Cholecalciferol 1000 UNITS tablet Take 1,000 Units by mouth daily.      . ciclopirox (PENLAC) 8 % solution APPLY TOPICALLY AT BEDTIME OVER NAIL AND SURROUNDING SKIN. APPLY DAILY OVER PREVIOUS COAT. 6.6 mL 2  . conjugated estrogens (PREMARIN) vaginal cream Use pv qod x 1 week, then pv q1wk 42.5 g 12  . dipyridamole-aspirin (AGGRENOX) 200-25 MG 12hr capsule Take 1 capsule by mouth 2 (two) times daily. 180 capsule 3  . ezetimibe (ZETIA) 10 MG tablet Take 1 tablet (10 mg total) by mouth daily. 90 tablet 3  . linaclotide (LINZESS) 290 MCG CAPS capsule Take 1 capsule (290 mcg total) by mouth daily. 30 capsule 11  . LORazepam (ATIVAN) 1 MG tablet TAKE 1 TO 2 TABLETS BY MOUTH AT BEDTIME AS NEEDED FOR SLEEP 60 tablet 3  . lubiprostone (AMITIZA) 24 MCG capsule Take 1 capsule (24 mcg total) by mouth 2 (two) times daily as needed for constipation. 60 capsule 11  . meloxicam (MOBIC) 7.5 MG tablet Take 1 tablet (7.5 mg total) by mouth daily. 30 tablet 3  . omeprazole (PRILOSEC) 20 MG capsule Take 2 capsules (40 mg total) by mouth daily. 180 capsule 3  . Pancrelipase, Lip-Prot-Amyl, 24000 units CPEP 1 po w/food tid 90 capsule 3  . polyethylene glycol powder (GLYCOLAX/MIRALAX) powder Take 17 g by mouth 2 (two) times  daily as needed for moderate constipation. 500 g 11  . propranolol ER (INDERAL LA) 120 MG 24 hr capsule Take 1 capsule (120 mg total) by mouth daily. 90 capsule 3  . RESTASIS 0.05 % ophthalmic emulsion     . topiramate (TOPAMAX) 50 MG tablet Take 50 mg by mouth daily.  1   No facility-administered medications prior to visit.     ROS Review of Systems  Constitutional: Negative for activity change, appetite change, chills, fatigue and unexpected weight change.  HENT: Negative for congestion, mouth sores and sinus pressure.   Eyes: Negative for visual disturbance.  Respiratory: Negative for cough and chest tightness.   Gastrointestinal: Positive for abdominal pain. Negative for blood in stool and nausea.  Genitourinary: Negative for difficulty urinating, frequency and vaginal pain.  Musculoskeletal: Negative for back pain and gait problem.  Skin: Negative for pallor and rash.  Neurological: Negative for dizziness, tremors, weakness, numbness and headaches.  Psychiatric/Behavioral: Negative for confusion and sleep disturbance.    Objective:  BP 130/70   Pulse (!) 54   Wt 138 lb (62.6 kg)   SpO2 97%   BMI 24.45 kg/m   BP Readings from Last 3 Encounters:  09/18/16 130/70  09/03/16 130/76  07/04/16 128/74    Wt Readings from Last 3 Encounters:  09/18/16 138 lb (62.6 kg)  09/03/16 139 lb (63 kg)  07/04/16 140  lb (63.5 kg)    Physical Exam  Constitutional: She appears well-developed. No distress.  HENT:  Head: Normocephalic.  Right Ear: External ear normal.  Left Ear: External ear normal.  Nose: Nose normal.  Mouth/Throat: Oropharynx is clear and moist.  Eyes: Conjunctivae are normal. Pupils are equal, round, and reactive to light. Right eye exhibits no discharge. Left eye exhibits no discharge.  Neck: Normal range of motion. Neck supple. No JVD present. No tracheal deviation present. No thyromegaly present.  Cardiovascular: Normal rate, regular rhythm and normal heart  sounds.   Pulmonary/Chest: No stridor. No respiratory distress. She has no wheezes.  Abdominal: Soft. Bowel sounds are normal. She exhibits no distension and no mass. There is tenderness. There is no rebound and no guarding.  Musculoskeletal: She exhibits no edema or tenderness.  Lymphadenopathy:    She has no cervical adenopathy.  Neurological: She displays normal reflexes. No cranial nerve deficit. She exhibits normal muscle tone. Coordination normal.  Skin: No rash noted. No erythema.  Psychiatric: She has a normal mood and affect. Her behavior is normal. Judgment and thought content normal.  Soft abd, sensitive to deep palp over transverse colon  Lab Results  Component Value Date   WBC 5.4 09/03/2016   HGB 11.3 (L) 09/03/2016   HCT 33.8 (L) 09/03/2016   PLT 227.0 09/03/2016   GLUCOSE 89 09/03/2016   CHOL 179 02/22/2016   TRIG 99.0 02/22/2016   HDL 52.00 02/22/2016   LDLDIRECT 165.2 05/25/2013   LDLCALC 108 (H) 02/22/2016   ALT 15 09/03/2016   AST 12 09/03/2016   NA 134 (L) 09/03/2016   K 4.5 09/03/2016   CL 104 09/03/2016   CREATININE 0.72 09/03/2016   BUN 14 09/03/2016   CO2 29 09/03/2016   TSH 1.35 07/26/2015   HGBA1C 5.9 09/29/2007    Dg Abd 2 Views  Result Date: 09/03/2016 CLINICAL DATA:  Lower abdominal pain for 3 weeks, history of diverticulosis at bowel syndrome, hypertension, GERD EXAM: ABDOMEN - 2 VIEW COMPARISON:  None FINDINGS: Scattered gas and slightly increased stool throughout colon. Nonobstructive bowel gas pattern without bowel dilatation,, bowel wall thickening or free air. Osseous demineralization with degenerative disc disease changes of the lumbar spine with levoconvex scoliosis. Atherosclerotic calcification aorta. Calcific density in the inferior pelvis, probably a calcified uterine leiomyoma with question adjacent pessary. Cannot exclude small nonobstructing RIGHT renal calculus 4 mm diameter. IMPRESSION: Increased stool in colon. Nonobstructive bowel  gas pattern. Probable calcified uterine leiomyoma. Cannot exclude nonobstructing 4 mm RIGHT renal calculus. Electronically Signed   By: Lavonia Dana M.D.   On: 09/03/2016 15:16    Assessment & Plan:   There are no diagnoses linked to this encounter. I am having Ms. Alcindor maintain her Cholecalciferol, conjugated estrogens, topiramate, RESTASIS, meloxicam, butalbital-acetaminophen-caffeine, ezetimibe, Pancrelipase (Lip-Prot-Amyl), omeprazole, propranolol ER, ciclopirox, captopril, dipyridamole-aspirin, linaclotide, LORazepam, polyethylene glycol powder, lubiprostone, amoxicillin-clavulanate, and MYRBETRIQ.  Meds ordered this encounter  Medications  . MYRBETRIQ 25 MG TB24 tablet    Sig: Take 25 mg by mouth daily as needed.    Refill:  6     Follow-up: No Follow-up on file.  Walker Kehr, MD

## 2016-09-18 NOTE — Progress Notes (Signed)
Pre visit review using our clinic review tool, if applicable. No additional management support is needed unless otherwise documented below in the visit note. 

## 2016-09-30 ENCOUNTER — Ambulatory Visit (INDEPENDENT_AMBULATORY_CARE_PROVIDER_SITE_OTHER)
Admission: RE | Admit: 2016-09-30 | Discharge: 2016-09-30 | Disposition: A | Discharge status: Home / Self Care | Payer: Medicare Other | Source: Ambulatory Visit | Attending: Internal Medicine | Admitting: Internal Medicine

## 2016-09-30 DIAGNOSIS — R1010 Upper abdominal pain, unspecified: Secondary | ICD-10-CM | POA: Diagnosis not present

## 2016-09-30 MED ORDER — IOPAMIDOL (ISOVUE-300) INJECTION 61%
100.0000 mL | Freq: Once | INTRAVENOUS | Status: AC | PRN
  Administered 2016-09-30: 100 mL via INTRAVENOUS

## 2016-10-01 ENCOUNTER — Telehealth: Payer: Self-pay | Admitting: Internal Medicine

## 2016-10-01 NOTE — Telephone Encounter (Signed)
Patient has called back in.  I gave her MD response on CT.  Patient is not sure if she has linzess but states she is taking "everything".  Patient states that she does not have constipation.  She believes this has gone away and that she only has pain.  She is requesting something for pain.  I told patient that a pain medication would make constipation worse.  Patient still insisting on pain medication.  Patient was not happy with CT results and wants to talk to Dr. Alain Marion.  Patient states she would start the miralax with linzess but needs new script sent to her pharmacy.   Please follow up with patient in regard.

## 2016-10-02 NOTE — Telephone Encounter (Signed)
Left mess for patient to call back.  

## 2016-10-02 NOTE — Telephone Encounter (Signed)
See MD's advisement below on 09/30/16 CTAbd/Pelvis Scan. Pt informed by Alvira Monday., [facility] scheduler.   Notes Recorded by Cassandria Anger, MD on 09/30/2016 at 7:57 PM EDT Erline Levine,  Please inform patient that her CT shows severe constipation. Use Linzess 1 caps together with 1-2 scoops of Miralax daily until the pt feels she has cleared her constipation. It may take 1-2 weeks. Please, mail the labs to the patient. Thx

## 2016-10-23 NOTE — Telephone Encounter (Signed)
I called pt- she does not understand me due to language barrier. I am mailing a copy of CT scan to pt and advised her to keep ROV with PCP on 11/04/16. PT states she understands.

## 2016-10-27 ENCOUNTER — Other Ambulatory Visit (INDEPENDENT_AMBULATORY_CARE_PROVIDER_SITE_OTHER): Payer: Medicare Other

## 2016-10-27 DIAGNOSIS — Z0000 Encounter for general adult medical examination without abnormal findings: Secondary | ICD-10-CM

## 2016-10-27 DIAGNOSIS — G4700 Insomnia, unspecified: Secondary | ICD-10-CM

## 2016-10-27 DIAGNOSIS — M5442 Lumbago with sciatica, left side: Secondary | ICD-10-CM

## 2016-10-27 DIAGNOSIS — F411 Generalized anxiety disorder: Secondary | ICD-10-CM | POA: Diagnosis not present

## 2016-10-27 DIAGNOSIS — I10 Essential (primary) hypertension: Secondary | ICD-10-CM

## 2016-10-27 DIAGNOSIS — E785 Hyperlipidemia, unspecified: Secondary | ICD-10-CM | POA: Diagnosis not present

## 2016-10-27 LAB — CBC WITH DIFFERENTIAL/PLATELET
BASOS PCT: 0.8 % (ref 0.0–3.0)
Basophils Absolute: 0 10*3/uL (ref 0.0–0.1)
EOS ABS: 0.1 10*3/uL (ref 0.0–0.7)
EOS PCT: 2.3 % (ref 0.0–5.0)
HEMATOCRIT: 34.3 % — AB (ref 36.0–46.0)
HEMOGLOBIN: 11.4 g/dL — AB (ref 12.0–15.0)
LYMPHS PCT: 44.3 % (ref 12.0–46.0)
Lymphs Abs: 1.9 10*3/uL (ref 0.7–4.0)
MCHC: 33.3 g/dL (ref 30.0–36.0)
MCV: 95.5 fl (ref 78.0–100.0)
Monocytes Absolute: 0.4 10*3/uL (ref 0.1–1.0)
Monocytes Relative: 9.7 % (ref 3.0–12.0)
NEUTROS ABS: 1.8 10*3/uL (ref 1.4–7.7)
Neutrophils Relative %: 42.9 % — ABNORMAL LOW (ref 43.0–77.0)
PLATELETS: 244 10*3/uL (ref 150.0–400.0)
RBC: 3.59 Mil/uL — ABNORMAL LOW (ref 3.87–5.11)
RDW: 13.4 % (ref 11.5–15.5)
WBC: 4.3 10*3/uL (ref 4.0–10.5)

## 2016-10-27 LAB — HEPATIC FUNCTION PANEL
ALT: 20 U/L (ref 0–35)
AST: 16 U/L (ref 0–37)
Albumin: 3.8 g/dL (ref 3.5–5.2)
Alkaline Phosphatase: 81 U/L (ref 39–117)
BILIRUBIN DIRECT: 0.1 mg/dL (ref 0.0–0.3)
BILIRUBIN TOTAL: 0.4 mg/dL (ref 0.2–1.2)
Total Protein: 6.6 g/dL (ref 6.0–8.3)

## 2016-10-27 LAB — LIPID PANEL
CHOL/HDL RATIO: 4
CHOLESTEROL: 198 mg/dL (ref 0–200)
HDL: 54.2 mg/dL (ref >39.00)
LDL CALC: 128 mg/dL — AB (ref 0–99)
NONHDL: 144.2
Triglycerides: 81 mg/dL (ref 0.0–149.0)
VLDL: 16.2 mg/dL (ref 0.0–40.0)

## 2016-10-27 LAB — URINALYSIS, ROUTINE W REFLEX MICROSCOPIC
Bilirubin Urine: NEGATIVE
Ketones, ur: NEGATIVE
Nitrite: NEGATIVE
PH: 7.5 (ref 5.0–8.0)
SPECIFIC GRAVITY, URINE: 1.01 (ref 1.000–1.030)
Total Protein, Urine: NEGATIVE
UROBILINOGEN UA: 0.2 (ref 0.0–1.0)
Urine Glucose: NEGATIVE

## 2016-10-27 LAB — TSH: TSH: 1.58 u[IU]/mL (ref 0.35–4.50)

## 2016-10-27 LAB — BASIC METABOLIC PANEL
BUN: 19 mg/dL (ref 6–23)
CHLORIDE: 106 meq/L (ref 96–112)
CO2: 26 meq/L (ref 19–32)
Calcium: 8.7 mg/dL (ref 8.4–10.5)
Creatinine, Ser: 0.7 mg/dL (ref 0.40–1.20)
GFR: 85.44 mL/min (ref >60.00)
GLUCOSE: 98 mg/dL (ref 70–99)
POTASSIUM: 3.7 meq/L (ref 3.5–5.1)
SODIUM: 138 meq/L (ref 135–145)

## 2016-10-30 DIAGNOSIS — Z1231 Encounter for screening mammogram for malignant neoplasm of breast: Secondary | ICD-10-CM | POA: Diagnosis not present

## 2016-10-30 LAB — HM MAMMOGRAPHY

## 2016-10-31 ENCOUNTER — Encounter: Payer: Self-pay | Admitting: Internal Medicine

## 2016-11-04 ENCOUNTER — Ambulatory Visit (INDEPENDENT_AMBULATORY_CARE_PROVIDER_SITE_OTHER): Payer: Medicare Other | Admitting: Internal Medicine

## 2016-11-04 ENCOUNTER — Encounter: Payer: Self-pay | Admitting: Internal Medicine

## 2016-11-04 VITALS — BP 138/70 | HR 62 | Wt 138.0 lb

## 2016-11-04 DIAGNOSIS — K8689 Other specified diseases of pancreas: Secondary | ICD-10-CM

## 2016-11-04 DIAGNOSIS — K581 Irritable bowel syndrome with constipation: Secondary | ICD-10-CM | POA: Diagnosis not present

## 2016-11-04 DIAGNOSIS — Z0000 Encounter for general adult medical examination without abnormal findings: Secondary | ICD-10-CM | POA: Diagnosis not present

## 2016-11-04 DIAGNOSIS — K30 Functional dyspepsia: Secondary | ICD-10-CM

## 2016-11-04 DIAGNOSIS — F411 Generalized anxiety disorder: Secondary | ICD-10-CM

## 2016-11-04 DIAGNOSIS — K589 Irritable bowel syndrome without diarrhea: Secondary | ICD-10-CM | POA: Insufficient documentation

## 2016-11-04 DIAGNOSIS — I10 Essential (primary) hypertension: Secondary | ICD-10-CM

## 2016-11-04 MED ORDER — HYOSCYAMINE SULFATE 0.125 MG PO TABS: 0.1250 mg | ORAL_TABLET | Freq: Four times a day (QID) | ORAL | 3 refills | Status: DC | PRN

## 2016-11-04 MED ORDER — BISACODYL 10 MG RE SUPP: 10.0000 mg | Freq: Every day | RECTAL | 5 refills | Status: DC | PRN

## 2016-11-04 MED ORDER — PANCRELIPASE (LIP-PROT-AMYL) 24000-76000 UNITS PO CPEP: ORAL_CAPSULE | ORAL | 3 refills | Status: DC

## 2016-11-04 MED ORDER — BUTALBITAL-APAP-CAFFEINE 50-325-40 MG PO TABS: 1.0000 | ORAL_TABLET | Freq: Two times a day (BID) | ORAL | 1 refills | Status: DC | PRN

## 2016-11-04 MED ORDER — LORAZEPAM 1 MG PO TABS: 1.0000 mg | ORAL_TABLET | Freq: Every evening | ORAL | 3 refills | Status: DC | PRN

## 2016-11-04 NOTE — Assessment & Plan Note (Signed)
Lorazepam ° °

## 2016-11-04 NOTE — Patient Instructions (Signed)

## 2016-11-04 NOTE — Assessment & Plan Note (Signed)
Better  

## 2016-11-04 NOTE — Assessment & Plan Note (Signed)
IBS-c Miralax, Amitiza, Hyoscimine

## 2016-11-04 NOTE — Assessment & Plan Note (Signed)
On Capoten

## 2016-11-04 NOTE — Assessment & Plan Note (Signed)

## 2016-11-04 NOTE — Progress Notes (Signed)
Subjective:  [name], female    DOB: 09/05/1934  Age: 82 y.o. MRN: [id number]  CC: No chief complaint on file.   HPI Sophia Santos presents for a well exam  Outpatient Medications Prior to Visit  Medication Sig Dispense Refill  . butalbital-acetaminophen-caffeine (FIORICET) 50-325-40 MG tablet Take 1-2 tablets by mouth 2 (two) times daily as needed for headache. 90 tablet 1  . captopril (CAPOTEN) 100 MG tablet Take 1 tablet (100 mg total) by mouth 2 (two) times daily. 180 tablet 3  . Cholecalciferol 1000 UNITS tablet Take 1,000 Units by mouth daily.      Marland Kitchen conjugated estrogens (PREMARIN) vaginal cream Use pv qod x 1 week, then pv q1wk 42.5 g 12  . dipyridamole-aspirin (AGGRENOX) 200-25 MG 12hr capsule Take 1 capsule by mouth 2 (two) times daily. 180 capsule 3  . ezetimibe (ZETIA) 10 MG tablet Take 1 tablet (10 mg total) by mouth daily. 90 tablet 3  . linaclotide (LINZESS) 290 MCG CAPS capsule Take 1 capsule (290 mcg total) by mouth daily. 30 capsule 11  . LORazepam (ATIVAN) 1 MG tablet TAKE 1 TO 2 TABLETS BY MOUTH AT BEDTIME AS NEEDED FOR SLEEP 60 tablet 3  . meloxicam (MOBIC) 7.5 MG tablet Take 1 tablet (7.5 mg total) by mouth daily. 30 tablet 3  . MYRBETRIQ 25 MG TB24 tablet Take 25 mg by mouth daily as needed.  6  . omeprazole (PRILOSEC) 20 MG capsule Take 2 capsules (40 mg total) by mouth daily. 180 capsule 3  . Pancrelipase, Lip-Prot-Amyl, 24000 units CPEP 1 po w/food tid 90 capsule 3  . polyethylene glycol powder (GLYCOLAX/MIRALAX) powder Take 17 g by mouth 2 (two) times daily as needed for moderate constipation. 500 g 11  . propranolol ER (INDERAL LA) 120 MG 24 hr capsule Take 1 capsule (120 mg total) by mouth daily. 90 capsule 3  . RESTASIS 0.05 % ophthalmic emulsion     . topiramate (TOPAMAX) 50 MG tablet Take 50 mg by mouth daily.  1  . amoxicillin-clavulanate (AUGMENTIN) 875-125 MG tablet Take 1 tablet by mouth 2 (two) times daily. 20 tablet 0  . ciclopirox  (PENLAC) 8 % solution APPLY TOPICALLY AT BEDTIME OVER NAIL AND SURROUNDING SKIN. APPLY DAILY OVER PREVIOUS COAT. (Patient not taking: Reported on 11/04/2016) 6.6 mL 2   No facility-administered medications prior to visit.     ROS Review of Systems  Constitutional: Negative for activity change, appetite change, chills, fatigue and unexpected weight change.  HENT: Negative for congestion, mouth sores and sinus pressure.   Eyes: Negative for visual disturbance.  Respiratory: Negative for cough and chest tightness.   Gastrointestinal: Negative for abdominal pain and nausea.  Genitourinary: Negative for difficulty urinating, frequency and vaginal pain.  Musculoskeletal: Positive for arthralgias. Negative for back pain and gait problem.  Skin: Negative for pallor and rash.  Neurological: Negative for dizziness, tremors, weakness, numbness and headaches.  Psychiatric/Behavioral: Negative for confusion and sleep disturbance.    Objective:  BP 138/70   Pulse 62   Wt 138 lb (62.6 kg)   SpO2 98%   BMI 24.45 kg/m   BP Readings from Last 3 Encounters:  11/04/16 138/70  09/18/16 130/70  09/03/16 130/76    Wt Readings from Last 3 Encounters:  11/04/16 138 lb (62.6 kg)  09/18/16 138 lb (62.6 kg)  09/03/16 139 lb (63 kg)    Physical Exam  Constitutional: She appears well-developed. No distress.  HENT:  Head: Normocephalic.  Right Ear: External ear normal.  Left Ear: External ear normal.  Nose: Nose normal.  Mouth/Throat: Oropharynx is clear and moist.  Eyes: Conjunctivae are normal. Pupils are equal, round, and reactive to light. Right eye exhibits no discharge. Left eye exhibits no discharge.  Neck: Normal range of motion. Neck supple. No JVD present. No tracheal deviation present. No thyromegaly present.  Cardiovascular: Normal rate, regular rhythm and normal heart sounds.   Pulmonary/Chest: No stridor. No respiratory distress. She has no wheezes.  Abdominal: Soft. Bowel sounds  are normal. She exhibits no distension and no mass. There is no tenderness. There is no rebound and no guarding.  Musculoskeletal: She exhibits no edema or tenderness.  Lymphadenopathy:    She has no cervical adenopathy.  Neurological: She displays normal reflexes. No cranial nerve deficit. She exhibits normal muscle tone. Coordination normal.  Skin: No rash noted. No erythema.  Psychiatric: She has a normal mood and affect. Her behavior is normal. Judgment and thought content normal.    Lab Results  Component Value Date   WBC 4.3 10/27/2016   HGB 11.4 (L) 10/27/2016   HCT 34.3 (L) 10/27/2016   PLT 244.0 10/27/2016   GLUCOSE 98 10/27/2016   CHOL 198 10/27/2016   TRIG 81.0 10/27/2016   HDL 54.20 10/27/2016   LDLDIRECT 165.2 05/25/2013   LDLCALC 128 (H) 10/27/2016   ALT 20 10/27/2016   AST 16 10/27/2016   NA 138 10/27/2016   K 3.7 10/27/2016   CL 106 10/27/2016   CREATININE 0.70 10/27/2016   BUN 19 10/27/2016   CO2 26 10/27/2016   TSH 1.58 10/27/2016   HGBA1C 5.9 09/29/2007    Ct Abdomen Pelvis W Contrast  Result Date: 09/30/2016 CLINICAL DATA:  Abdominal pain, chronic constipation for 2 weeks, history of diverticulosis EXAM: CT ABDOMEN AND PELVIS WITH CONTRAST TECHNIQUE: Multidetector CT imaging of the abdomen and pelvis was performed using the standard protocol following bolus administration of intravenous contrast. CONTRAST:  195mL ISOVUE-300 IOPAMIDOL (ISOVUE-300) INJECTION 61% COMPARISON:  None. FINDINGS: Lower chest: Lung bases are unremarkable. Hepatobiliary: Mild fatty infiltration of the liver. No focal hepatic mass. No calcified gallstones are noted within gallbladder. Pancreas: Unremarkable Spleen: Enhanced spleen is unremarkable. Adrenals/Urinary Tract: Probable adenoma left adrenal gland measures 1.2 cm. No all hydronephrosis or hydroureter. Enhanced kidneys are symmetrical in size. Delayed renal images shows bilateral renal symmetrical excretion. Bilateral visualized  proximal ureter is unremarkable. Stomach/Bowel: No small bowel obstruction. Abundant stool noted in right colon transverse colon and in proximal descending colon. Moderate stool noted left colon and sigmoid colon. Colonic diverticula are noted left colon and sigmoid colon. No evidence of acute colitis or diverticulitis. No distal colonic obstruction. No pericecal inflammation.  Normal appendix Vascular/Lymphatic: No aortic aneurysm. Atherosclerotic calcifications of abdominal aorta and iliac arteries. No retroperitoneal or mesenteric adenopathy. Reproductive: Calcified uterine fibroid measures 2.8 cm. Other: No ascites or free abdominal air. A pelvic pessary is noted. There is thickening of anterior wall of the urinary bladder. Cystitis or inflammation cannot be excluded. Small bilateral inguinal canal hernia containing fat without evidence of acute complication. Musculoskeletal: There is levoscoliosis of the lumbar spine. Sagittal images of the spine shows multilevel degenerative changes thoracolumbar spine. No destructive bony lesions are noted. IMPRESSION: 1. Abundant stool noted in right colon and transverse colon. Moderate stool noted in distal colon. Colonic diverticula are noted left colon and sigmoid colon. No evidence of acute colitis or diverticulitis. 2. Mild thickening of anterior wall of urinary bladder. Cystitis or  chronic inflammation cannot be excluded. 3. Calcified uterine fibroid measures 2.8 cm. Pelvic pessary is noted. 4. No pericecal inflammation.  Normal appendix. 5. No small bowel obstruction. 6. Probable left adrenal adenoma measures 1.2 cm. Electronically Signed   By: Lahoma Crocker M.D.   On: 09/30/2016 14:18    Assessment & Plan:   There are no diagnoses linked to this encounter. I have discontinued Ms. Bagdasarian's amoxicillin-clavulanate. I am also having her maintain her Cholecalciferol, conjugated estrogens, topiramate, RESTASIS, meloxicam, butalbital-acetaminophen-caffeine, ezetimibe,  Pancrelipase (Lip-Prot-Amyl), omeprazole, propranolol ER, ciclopirox, captopril, dipyridamole-aspirin, linaclotide, LORazepam, polyethylene glycol powder, MYRBETRIQ, and AMITIZA.  Meds ordered this encounter  Medications  . AMITIZA 24 MCG capsule    Sig: Take 1 capsule by mouth 2 (two) times daily as needed.    Refill:  11     Follow-up: No Follow-up on file.  Walker Kehr, MD

## 2016-11-04 NOTE — Assessment & Plan Note (Signed)
Pancrease

## 2016-11-04 NOTE — Progress Notes (Signed)
Pre visit review using our clinic review tool, if applicable. No additional management support is needed unless otherwise documented below in the visit note. 

## 2016-11-20 DIAGNOSIS — L570 Actinic keratosis: Secondary | ICD-10-CM | POA: Diagnosis not present

## 2016-12-03 DIAGNOSIS — Z961 Presence of intraocular lens: Secondary | ICD-10-CM | POA: Diagnosis not present

## 2016-12-03 DIAGNOSIS — H01005 Unspecified blepharitis left lower eyelid: Secondary | ICD-10-CM | POA: Diagnosis not present

## 2016-12-03 DIAGNOSIS — H01002 Unspecified blepharitis right lower eyelid: Secondary | ICD-10-CM | POA: Diagnosis not present

## 2016-12-03 DIAGNOSIS — H01004 Unspecified blepharitis left upper eyelid: Secondary | ICD-10-CM | POA: Diagnosis not present

## 2016-12-03 DIAGNOSIS — H01001 Unspecified blepharitis right upper eyelid: Secondary | ICD-10-CM | POA: Diagnosis not present

## 2017-01-06 DIAGNOSIS — D0439 Carcinoma in situ of skin of other parts of face: Secondary | ICD-10-CM | POA: Diagnosis not present

## 2017-01-06 DIAGNOSIS — C44329 Squamous cell carcinoma of skin of other parts of face: Secondary | ICD-10-CM | POA: Diagnosis not present

## 2017-02-03 ENCOUNTER — Encounter: Payer: Self-pay | Admitting: Internal Medicine

## 2017-02-03 ENCOUNTER — Other Ambulatory Visit (INDEPENDENT_AMBULATORY_CARE_PROVIDER_SITE_OTHER): Payer: Medicare Other

## 2017-02-03 ENCOUNTER — Ambulatory Visit (INDEPENDENT_AMBULATORY_CARE_PROVIDER_SITE_OTHER): Payer: Medicare Other | Admitting: Internal Medicine

## 2017-02-03 VITALS — BP 112/78 | HR 61 | Temp 97.8°F | Resp 16 | Ht 63.0 in | Wt 139.2 lb

## 2017-02-03 DIAGNOSIS — N3000 Acute cystitis without hematuria: Secondary | ICD-10-CM

## 2017-02-03 DIAGNOSIS — I10 Essential (primary) hypertension: Secondary | ICD-10-CM | POA: Diagnosis not present

## 2017-02-03 DIAGNOSIS — N390 Urinary tract infection, site not specified: Secondary | ICD-10-CM | POA: Insufficient documentation

## 2017-02-03 DIAGNOSIS — Z23 Encounter for immunization: Secondary | ICD-10-CM

## 2017-02-03 DIAGNOSIS — F411 Generalized anxiety disorder: Secondary | ICD-10-CM | POA: Diagnosis not present

## 2017-02-03 DIAGNOSIS — E785 Hyperlipidemia, unspecified: Secondary | ICD-10-CM | POA: Diagnosis not present

## 2017-02-03 DIAGNOSIS — K5900 Constipation, unspecified: Secondary | ICD-10-CM | POA: Diagnosis not present

## 2017-02-03 LAB — URINALYSIS, ROUTINE W REFLEX MICROSCOPIC
Bilirubin Urine: NEGATIVE
Ketones, ur: NEGATIVE
Nitrite: NEGATIVE
SPECIFIC GRAVITY, URINE: 1.01 (ref 1.000–1.030)
TOTAL PROTEIN, URINE-UPE24: NEGATIVE
UROBILINOGEN UA: 0.2 (ref 0.0–1.0)
Urine Glucose: NEGATIVE
pH: 7 (ref 5.0–8.0)

## 2017-02-03 LAB — LIPID PANEL
CHOL/HDL RATIO: 4
Cholesterol: 187 mg/dL (ref 0–200)
HDL: 48.1 mg/dL (ref >39.00)
LDL Cholesterol: 101 mg/dL — ABNORMAL HIGH (ref 0–99)
NonHDL: 138.64
TRIGLYCERIDES: 186 mg/dL — AB (ref 0.0–149.0)
VLDL: 37.2 mg/dL (ref 0.0–40.0)

## 2017-02-03 LAB — BASIC METABOLIC PANEL
BUN: 23 mg/dL (ref 6–23)
CALCIUM: 8.8 mg/dL (ref 8.4–10.5)
CO2: 28 mEq/L (ref 19–32)
CREATININE: 0.73 mg/dL (ref 0.40–1.20)
Chloride: 103 mEq/L (ref 96–112)
GFR: 81.35 mL/min (ref >60.00)
Glucose, Bld: 93 mg/dL (ref 70–99)
Potassium: 4.4 mEq/L (ref 3.5–5.1)
Sodium: 133 mEq/L — ABNORMAL LOW (ref 135–145)

## 2017-02-03 LAB — HEPATIC FUNCTION PANEL
ALBUMIN: 3.8 g/dL (ref 3.5–5.2)
ALK PHOS: 64 U/L (ref 39–117)
ALT: 15 U/L (ref 0–35)
AST: 14 U/L (ref 0–37)
Bilirubin, Direct: 0 mg/dL (ref 0.0–0.3)
TOTAL PROTEIN: 6.4 g/dL (ref 6.0–8.3)
Total Bilirubin: 0.3 mg/dL (ref 0.2–1.2)

## 2017-02-03 MED ORDER — PANCRELIPASE (LIP-PROT-AMYL) 24000-76000 UNITS PO CPEP: ORAL_CAPSULE | ORAL | 3 refills | Status: DC

## 2017-02-03 MED ORDER — EZETIMIBE 10 MG PO TABS: 10.0000 mg | ORAL_TABLET | Freq: Every day | ORAL | 3 refills | Status: DC

## 2017-02-03 MED ORDER — ASPIRIN-DIPYRIDAMOLE ER 25-200 MG PO CP12: 1.0000 | ORAL_CAPSULE | Freq: Two times a day (BID) | ORAL | 3 refills | Status: DC

## 2017-02-03 NOTE — Assessment & Plan Note (Signed)
Captopril 

## 2017-02-03 NOTE — Progress Notes (Signed)
Subjective:  [name], female    DOB: [birth year]  Age: 83 y.o. MRN: [id number]  CC: Follow-up (HTN)   HPI Naraya Teagle presents for HTN, constipation, insomnia f/u  Outpatient Medications Prior to Visit  Medication Sig Dispense Refill  . AMITIZA 24 MCG capsule Take 1 capsule by mouth 2 (two) times daily as needed.  11  . butalbital-acetaminophen-caffeine (FIORICET) 50-325-40 MG tablet Take 1-2 tablets by mouth 2 (two) times daily as needed for headache. 90 tablet 1  . captopril (CAPOTEN) 100 MG tablet Take 1 tablet (100 mg total) by mouth 2 (two) times daily. 180 tablet 3  . Cholecalciferol 1000 UNITS tablet Take 1,000 Units by mouth daily.      Marland Kitchen conjugated estrogens (PREMARIN) vaginal cream Use pv qod x 1 week, then pv q1wk 42.5 g 12  . dipyridamole-aspirin (AGGRENOX) 200-25 MG 12hr capsule Take 1 capsule by mouth 2 (two) times daily. 180 capsule 3  . ezetimibe (ZETIA) 10 MG tablet Take 1 tablet (10 mg total) by mouth daily. 90 tablet 3  . LORazepam (ATIVAN) 1 MG tablet Take 1-2 tablets (1-2 mg total) by mouth at bedtime as needed. for sleep 60 tablet 3  . meloxicam (MOBIC) 7.5 MG tablet Take 1 tablet (7.5 mg total) by mouth daily. 30 tablet 3  . MYRBETRIQ 25 MG TB24 tablet Take 25 mg by mouth daily as needed.  6  . omeprazole (PRILOSEC) 20 MG capsule Take 2 capsules (40 mg total) by mouth daily. 180 capsule 3  . Pancrelipase, Lip-Prot-Amyl, 24000-76000 units CPEP 1 po w/food tid 90 capsule 3  . polyethylene glycol powder (GLYCOLAX/MIRALAX) powder Take 17 g by mouth 2 (two) times daily as needed for moderate constipation. 500 g 11  . propranolol ER (INDERAL LA) 120 MG 24 hr capsule Take 1 capsule (120 mg total) by mouth daily. 90 capsule 3  . RESTASIS 0.05 % ophthalmic emulsion     . topiramate (TOPAMAX) 50 MG tablet Take 50 mg by mouth daily.  1  . bisacodyl (DULCOLAX) 10 MG suppository Place 1 suppository (10 mg total) rectally daily as needed for moderate constipation.  28 suppository 5  . ciclopirox (PENLAC) 8 % solution APPLY TOPICALLY AT BEDTIME OVER NAIL AND SURROUNDING SKIN. APPLY DAILY OVER PREVIOUS COAT. (Patient not taking: Reported on 11/04/2016) 6.6 mL 2  . hyoscyamine (LEVSIN, ANASPAZ) 0.125 MG tablet Take 1-2 tablets (0.125-0.25 mg total) by mouth every 6 (six) hours as needed. 100 tablet 3   No facility-administered medications prior to visit.     ROS Review of Systems  Constitutional: Negative for activity change, appetite change, chills, fatigue and unexpected weight change.  HENT: Negative for congestion, mouth sores and sinus pressure.   Eyes: Negative for visual disturbance.  Respiratory: Negative for cough and chest tightness.   Gastrointestinal: Negative for abdominal pain and nausea.  Genitourinary: Negative for difficulty urinating, frequency and vaginal pain.  Musculoskeletal: Negative for back pain and gait problem.  Skin: Negative for pallor and rash.  Neurological: Negative for dizziness, tremors, weakness, numbness and headaches.  Psychiatric/Behavioral: Negative for confusion and sleep disturbance.    Objective:  BP 112/78   Pulse 61   Temp 97.8 F (36.6 C) (Oral)   Resp 16   Ht 5\' 3"  (1.6 m)   Wt 139 lb 4 oz (63.2 kg)   SpO2 98%   BMI 24.67 kg/m   BP Readings from Last 3 Encounters:  02/03/17 112/78  11/04/16 138/70  09/18/16 130/70  Wt Readings from Last 3 Encounters:  02/03/17 139 lb 4 oz (63.2 kg)  11/04/16 138 lb (62.6 kg)  09/18/16 138 lb (62.6 kg)    Physical Exam  Constitutional: She appears well-developed. No distress.  HENT:  Head: Normocephalic.  Right Ear: External ear normal.  Left Ear: External ear normal.  Nose: Nose normal.  Mouth/Throat: Oropharynx is clear and moist.  Eyes: Conjunctivae are normal. Pupils are equal, round, and reactive to light. Right eye exhibits no discharge. Left eye exhibits no discharge.  Neck: Normal range of motion. Neck supple. No JVD present. No tracheal  deviation present. No thyromegaly present.  Cardiovascular: Normal rate, regular rhythm and normal heart sounds.   Pulmonary/Chest: No stridor. No respiratory distress. She has no wheezes.  Abdominal: Soft. Bowel sounds are normal. She exhibits no distension and no mass. There is no tenderness. There is no rebound and no guarding.  Musculoskeletal: She exhibits no edema or tenderness.  Lymphadenopathy:    She has no cervical adenopathy.  Neurological: She displays normal reflexes. No cranial nerve deficit. She exhibits normal muscle tone. Coordination normal.  Skin: No rash noted. No erythema.  Psychiatric: She has a normal mood and affect. Her behavior is normal. Judgment and thought content normal.    Lab Results  Component Value Date   WBC 4.3 10/27/2016   HGB 11.4 (L) 10/27/2016   HCT 34.3 (L) 10/27/2016   PLT 244.0 10/27/2016   GLUCOSE 98 10/27/2016   CHOL 198 10/27/2016   TRIG 81.0 10/27/2016   HDL 54.20 10/27/2016   LDLDIRECT 165.2 05/25/2013   LDLCALC 128 (H) 10/27/2016   ALT 20 10/27/2016   AST 16 10/27/2016   NA 138 10/27/2016   K 3.7 10/27/2016   CL 106 10/27/2016   CREATININE 0.70 10/27/2016   BUN 19 10/27/2016   CO2 26 10/27/2016   TSH 1.58 10/27/2016   HGBA1C 5.9 09/29/2007    Ct Abdomen Pelvis W Contrast  Result Date: 09/30/2016 CLINICAL DATA:  Abdominal pain, chronic constipation for 2 weeks, history of diverticulosis EXAM: CT ABDOMEN AND PELVIS WITH CONTRAST TECHNIQUE: Multidetector CT imaging of the abdomen and pelvis was performed using the standard protocol following bolus administration of intravenous contrast. CONTRAST:  131mL ISOVUE-300 IOPAMIDOL (ISOVUE-300) INJECTION 61% COMPARISON:  None. FINDINGS: Lower chest: Lung bases are unremarkable. Hepatobiliary: Mild fatty infiltration of the liver. No focal hepatic mass. No calcified gallstones are noted within gallbladder. Pancreas: Unremarkable Spleen: Enhanced spleen is unremarkable. Adrenals/Urinary  Tract: Probable adenoma left adrenal gland measures 1.2 cm. No all hydronephrosis or hydroureter. Enhanced kidneys are symmetrical in size. Delayed renal images shows bilateral renal symmetrical excretion. Bilateral visualized proximal ureter is unremarkable. Stomach/Bowel: No small bowel obstruction. Abundant stool noted in right colon transverse colon and in proximal descending colon. Moderate stool noted left colon and sigmoid colon. Colonic diverticula are noted left colon and sigmoid colon. No evidence of acute colitis or diverticulitis. No distal colonic obstruction. No pericecal inflammation.  Normal appendix Vascular/Lymphatic: No aortic aneurysm. Atherosclerotic calcifications of abdominal aorta and iliac arteries. No retroperitoneal or mesenteric adenopathy. Reproductive: Calcified uterine fibroid measures 2.8 cm. Other: No ascites or free abdominal air. A pelvic pessary is noted. There is thickening of anterior wall of the urinary bladder. Cystitis or inflammation cannot be excluded. Small bilateral inguinal canal hernia containing fat without evidence of acute complication. Musculoskeletal: There is levoscoliosis of the lumbar spine. Sagittal images of the spine shows multilevel degenerative changes thoracolumbar spine. No destructive bony lesions are noted.  IMPRESSION: 1. Abundant stool noted in right colon and transverse colon. Moderate stool noted in distal colon. Colonic diverticula are noted left colon and sigmoid colon. No evidence of acute colitis or diverticulitis. 2. Mild thickening of anterior wall of urinary bladder. Cystitis or chronic inflammation cannot be excluded. 3. Calcified uterine fibroid measures 2.8 cm. Pelvic pessary is noted. 4. No pericecal inflammation.  Normal appendix. 5. No small bowel obstruction. 6. Probable left adrenal adenoma measures 1.2 cm. Electronically Signed   By: Lahoma Crocker M.D.   On: 09/30/2016 14:18    Assessment & Plan:   There are no diagnoses linked to  this encounter. I have discontinued Ms. Nott's ciclopirox, hyoscyamine, and bisacodyl. I am also having her maintain her Cholecalciferol, conjugated estrogens, topiramate, RESTASIS, meloxicam, ezetimibe, omeprazole, propranolol ER, captopril, dipyridamole-aspirin, polyethylene glycol powder, MYRBETRIQ, AMITIZA, butalbital-acetaminophen-caffeine, Pancrelipase (Lip-Prot-Amyl), and LORazepam.  No orders of the defined types were placed in this encounter.    Follow-up: No Follow-up on file.  Walker Kehr, MD

## 2017-02-03 NOTE — Assessment & Plan Note (Signed)
Zetia

## 2017-02-03 NOTE — Progress Notes (Signed)
Pre-visit discussion using our clinic review tool. No additional management support is needed unless otherwise documented below in the visit note.  

## 2017-02-03 NOTE — Assessment & Plan Note (Signed)
Linzess, Amitiza

## 2017-02-03 NOTE — Assessment & Plan Note (Signed)
UA and Cx 

## 2017-02-03 NOTE — Addendum Note (Signed)
Addended by: Valere Dross on: 02/03/2017 03:34 PM   Modules accepted: Orders

## 2017-02-03 NOTE — Assessment & Plan Note (Signed)
Lorazepam prn  Potential benefits of a long term benzodiazepines  use as well as potential risks  and complications were explained to the patient and were aknowledged.  

## 2017-02-04 ENCOUNTER — Other Ambulatory Visit: Payer: Self-pay | Admitting: Internal Medicine

## 2017-02-04 DIAGNOSIS — H01004 Unspecified blepharitis left upper eyelid: Secondary | ICD-10-CM | POA: Diagnosis not present

## 2017-02-04 DIAGNOSIS — H01005 Unspecified blepharitis left lower eyelid: Secondary | ICD-10-CM | POA: Diagnosis not present

## 2017-02-04 DIAGNOSIS — H01001 Unspecified blepharitis right upper eyelid: Secondary | ICD-10-CM | POA: Diagnosis not present

## 2017-02-04 DIAGNOSIS — Z961 Presence of intraocular lens: Secondary | ICD-10-CM | POA: Diagnosis not present

## 2017-02-04 DIAGNOSIS — H01002 Unspecified blepharitis right lower eyelid: Secondary | ICD-10-CM | POA: Diagnosis not present

## 2017-02-04 LAB — URINE CULTURE: ORGANISM ID, BACTERIA: NO GROWTH

## 2017-02-05 ENCOUNTER — Other Ambulatory Visit: Payer: Self-pay | Admitting: Internal Medicine

## 2017-02-06 MED ORDER — LORAZEPAM 1 MG PO TABS: 1.0000 mg | ORAL_TABLET | Freq: Every evening | ORAL | 1 refills | Status: DC | PRN

## 2017-02-06 NOTE — Addendum Note (Signed)
Addended by: Aviva Signs M on: 02/06/2017 11:46 AM   Modules accepted: Orders

## 2017-02-06 NOTE — Telephone Encounter (Signed)
rx faxed to [facility] 

## 2017-02-16 ENCOUNTER — Telehealth: Payer: Self-pay

## 2017-02-16 NOTE — Telephone Encounter (Signed)
Patient Rx Zetia is under temporary supply due to it not being one of the formulary. Please advise if she needs to currently be on this medication or another therapy.

## 2017-02-18 NOTE — Telephone Encounter (Signed)
There is no alternative available Thx

## 2017-02-19 NOTE — Telephone Encounter (Signed)
Then a PA must be done

## 2017-02-25 ENCOUNTER — Telehealth: Payer: Self-pay

## 2017-02-25 NOTE — Telephone Encounter (Signed)
PA started via KEY: [id number]

## 2017-02-26 DIAGNOSIS — G43719 Chronic migraine without aura, intractable, without status migrainosus: Secondary | ICD-10-CM | POA: Diagnosis not present

## 2017-02-26 DIAGNOSIS — G43019 Migraine without aura, intractable, without status migrainosus: Secondary | ICD-10-CM | POA: Diagnosis not present

## 2017-03-05 NOTE — Telephone Encounter (Signed)
What is the Rx? Thx 

## 2017-03-05 NOTE — Telephone Encounter (Signed)
PA was denied.   Are there any alternative that patient may try?

## 2017-03-06 NOTE — Telephone Encounter (Signed)
Generic Zetia is not covered as well? Thx

## 2017-03-06 NOTE — Telephone Encounter (Signed)
Noted. Thx.

## 2017-03-06 NOTE — Telephone Encounter (Signed)
Alternative for zetia?

## 2017-03-06 NOTE — Telephone Encounter (Signed)
zetia 

## 2017-04-04 ENCOUNTER — Other Ambulatory Visit: Payer: Self-pay | Admitting: Internal Medicine

## 2017-04-07 DIAGNOSIS — N952 Postmenopausal atrophic vaginitis: Secondary | ICD-10-CM | POA: Diagnosis not present

## 2017-04-29 DIAGNOSIS — H04129 Dry eye syndrome of unspecified lacrimal gland: Secondary | ICD-10-CM | POA: Diagnosis not present

## 2017-04-29 DIAGNOSIS — H01004 Unspecified blepharitis left upper eyelid: Secondary | ICD-10-CM | POA: Diagnosis not present

## 2017-04-29 DIAGNOSIS — H01005 Unspecified blepharitis left lower eyelid: Secondary | ICD-10-CM | POA: Diagnosis not present

## 2017-04-29 DIAGNOSIS — Z961 Presence of intraocular lens: Secondary | ICD-10-CM | POA: Diagnosis not present

## 2017-04-29 DIAGNOSIS — H01001 Unspecified blepharitis right upper eyelid: Secondary | ICD-10-CM | POA: Diagnosis not present

## 2017-05-07 ENCOUNTER — Other Ambulatory Visit: Payer: Self-pay | Admitting: Internal Medicine

## 2017-06-01 ENCOUNTER — Other Ambulatory Visit (INDEPENDENT_AMBULATORY_CARE_PROVIDER_SITE_OTHER): Payer: Medicare Other

## 2017-06-01 DIAGNOSIS — E785 Hyperlipidemia, unspecified: Secondary | ICD-10-CM

## 2017-06-01 LAB — LIPID PANEL
CHOL/HDL RATIO: 3
Cholesterol: 207 mg/dL — ABNORMAL HIGH (ref 0–200)
HDL: 62.3 mg/dL (ref >39.00)
LDL Cholesterol: 131 mg/dL — ABNORMAL HIGH (ref 0–99)
NONHDL: 144.47
Triglycerides: 66 mg/dL (ref 0.0–149.0)
VLDL: 13.2 mg/dL (ref 0.0–40.0)

## 2017-06-01 LAB — BASIC METABOLIC PANEL
BUN: 22 mg/dL (ref 6–23)
CALCIUM: 8.8 mg/dL (ref 8.4–10.5)
CHLORIDE: 106 meq/L (ref 96–112)
CO2: 26 meq/L (ref 19–32)
CREATININE: 0.86 mg/dL (ref 0.40–1.20)
GFR: 67.27 mL/min (ref >60.00)
GLUCOSE: 98 mg/dL (ref 70–99)
Potassium: 4.2 mEq/L (ref 3.5–5.1)
SODIUM: 138 meq/L (ref 135–145)

## 2017-06-01 LAB — HEPATIC FUNCTION PANEL
ALBUMIN: 3.7 g/dL (ref 3.5–5.2)
ALK PHOS: 71 U/L (ref 39–117)
ALT: 29 U/L (ref 0–35)
AST: 19 U/L (ref 0–37)
BILIRUBIN TOTAL: 0.4 mg/dL (ref 0.2–1.2)
Bilirubin, Direct: 0 mg/dL (ref 0.0–0.3)
Total Protein: 6.3 g/dL (ref 6.0–8.3)

## 2017-06-09 ENCOUNTER — Ambulatory Visit (INDEPENDENT_AMBULATORY_CARE_PROVIDER_SITE_OTHER): Payer: Medicare Other | Admitting: Internal Medicine

## 2017-06-09 ENCOUNTER — Encounter: Payer: Self-pay | Admitting: Internal Medicine

## 2017-06-09 DIAGNOSIS — R7989 Other specified abnormal findings of blood chemistry: Secondary | ICD-10-CM | POA: Diagnosis not present

## 2017-06-09 DIAGNOSIS — I10 Essential (primary) hypertension: Secondary | ICD-10-CM

## 2017-06-09 DIAGNOSIS — G43809 Other migraine, not intractable, without status migrainosus: Secondary | ICD-10-CM

## 2017-06-09 DIAGNOSIS — G4700 Insomnia, unspecified: Secondary | ICD-10-CM | POA: Diagnosis not present

## 2017-06-09 DIAGNOSIS — E785 Hyperlipidemia, unspecified: Secondary | ICD-10-CM | POA: Diagnosis not present

## 2017-06-09 DIAGNOSIS — F411 Generalized anxiety disorder: Secondary | ICD-10-CM | POA: Diagnosis not present

## 2017-06-09 DIAGNOSIS — K219 Gastro-esophageal reflux disease without esophagitis: Secondary | ICD-10-CM

## 2017-06-09 MED ORDER — ACYCLOVIR 400 MG PO TABS: 400.0000 mg | ORAL_TABLET | Freq: Three times a day (TID) | ORAL | 3 refills | Status: DC

## 2017-06-09 MED ORDER — MYRBETRIQ 25 MG PO TB24: 25.0000 mg | ORAL_TABLET | Freq: Every day | ORAL | 11 refills | Status: DC | PRN

## 2017-06-09 NOTE — Assessment & Plan Note (Signed)
improved TG

## 2017-06-09 NOTE — Assessment & Plan Note (Signed)
On Captopril

## 2017-06-09 NOTE — Assessment & Plan Note (Signed)
Labs

## 2017-06-09 NOTE — Assessment & Plan Note (Signed)
Prilosec 

## 2017-06-09 NOTE — Assessment & Plan Note (Signed)
Lorazepam prn  Potential benefits of a long term benzodiazepines  use as well as potential risks  and complications were explained to the patient and were aknowledged.  

## 2017-06-09 NOTE — Assessment & Plan Note (Signed)
Fioricet rare  Potential benefits of a long term fioricet  use as well as potential risks  and complications were explained to the patient and were aknowledged.

## 2017-06-09 NOTE — Progress Notes (Signed)
Subjective:  [name], female    DOB: 1934-05-22  Age: 83 y.o. MRN: [id number]  CC: No chief complaint on file.   HPI Sophia Santos presents for HAs, dyslipidemia, urinary urgency f/u   Outpatient Medications Prior to Visit  Medication Sig Dispense Refill  . AMITIZA 24 MCG capsule Take 1 capsule by mouth 2 (two) times daily as needed.  11  . butalbital-acetaminophen-caffeine (FIORICET) 50-325-40 MG tablet Take 1-2 tablets by mouth 2 (two) times daily as needed for headache. 90 tablet 1  . captopril (CAPOTEN) 100 MG tablet Take 1 tablet (100 mg total) by mouth 2 (two) times daily. 180 tablet 3  . Cholecalciferol 1000 UNITS tablet Take 1,000 Units by mouth daily.      Marland Kitchen conjugated estrogens (PREMARIN) vaginal cream Use pv qod x 1 week, then pv q1wk 42.5 g 12  . dipyridamole-aspirin (AGGRENOX) 200-25 MG 12hr capsule Take 1 capsule by mouth 2 (two) times daily. 180 capsule 3  . dipyridamole-aspirin (AGGRENOX) 200-25 MG 12hr capsule TAKE 1 CAPSULE BY MOUTH 2 (TWO) TIMES DAILY. 180 capsule 1  . ezetimibe (ZETIA) 10 MG tablet Take 1 tablet (10 mg total) by mouth daily. 90 tablet 3  . LORazepam (ATIVAN) 1 MG tablet Take 1-2 tablets (1-2 mg total) by mouth at bedtime as needed. for sleep 60 tablet 1  . meloxicam (MOBIC) 7.5 MG tablet Take 1 tablet (7.5 mg total) by mouth daily. 30 tablet 3  . MYRBETRIQ 25 MG TB24 tablet Take 25 mg by mouth daily as needed.  6  . omeprazole (PRILOSEC) 20 MG capsule TAKE 2 CAPSULES (40 MG TOTAL) BY MOUTH DAILY. 180 capsule 2  . Pancrelipase, Lip-Prot-Amyl, 24000-76000 units CPEP 1 po w/food tid 270 capsule 3  . polyethylene glycol powder (GLYCOLAX/MIRALAX) powder Take 17 g by mouth 2 (two) times daily as needed for moderate constipation. 500 g 11  . propranolol ER (INDERAL LA) 120 MG 24 hr capsule TAKE 1 CAPSULE (120 MG TOTAL) BY MOUTH DAILY. 90 capsule 2  . RESTASIS 0.05 % ophthalmic emulsion     . topiramate (TOPAMAX) 50 MG tablet Take 50 mg by  mouth daily.  1   No facility-administered medications prior to visit.     ROS Review of Systems  Constitutional: Negative for activity change, appetite change, chills, fatigue and unexpected weight change.  HENT: Negative for congestion, mouth sores and sinus pressure.   Eyes: Positive for pain. Negative for visual disturbance.  Respiratory: Negative for cough and chest tightness.   Gastrointestinal: Negative for abdominal pain and nausea.  Genitourinary: Negative for difficulty urinating, frequency and vaginal pain.  Musculoskeletal: Negative for back pain and gait problem.  Skin: Negative for pallor and rash.  Neurological: Negative for dizziness, tremors, weakness, numbness and headaches.  Psychiatric/Behavioral: Negative for confusion and sleep disturbance.  dry eyes after cataract surgery 3 y ago  Objective:  BP 126/72 (BP Location: Left Arm, Patient Position: Sitting, Cuff Size: Normal)   Pulse 60   Temp 97.9 F (36.6 C) (Oral)   Ht 5\' 3"  (1.6 m)   Wt 136 lb (61.7 kg)   SpO2 100%   BMI 24.09 kg/m   BP Readings from Last 3 Encounters:  06/09/17 126/72  02/03/17 112/78  11/04/16 138/70    Wt Readings from Last 3 Encounters:  06/09/17 136 lb (61.7 kg)  02/03/17 139 lb 4 oz (63.2 kg)  11/04/16 138 lb (62.6 kg)    Physical Exam  Constitutional: She appears well-developed.  No distress.  HENT:  Head: Normocephalic.  Right Ear: External ear normal.  Left Ear: External ear normal.  Nose: Nose normal.  Mouth/Throat: Oropharynx is clear and moist.  Eyes: Conjunctivae are normal. Pupils are equal, round, and reactive to light. Right eye exhibits no discharge. Left eye exhibits no discharge.  Neck: Normal range of motion. Neck supple. No JVD present. No tracheal deviation present. No thyromegaly present.  Cardiovascular: Normal rate, regular rhythm and normal heart sounds.   Pulmonary/Chest: No stridor. No respiratory distress. She has no wheezes.  Abdominal: Soft.  Bowel sounds are normal. She exhibits no distension and no mass. There is no tenderness. There is no rebound and no guarding.  Musculoskeletal: She exhibits no edema or tenderness.  Lymphadenopathy:    She has no cervical adenopathy.  Neurological: She displays normal reflexes. No cranial nerve deficit. She exhibits normal muscle tone. Coordination normal.  Skin: No rash noted. No erythema.  Psychiatric: She has a normal mood and affect. Her behavior is normal. Judgment and thought content normal.    Lab Results  Component Value Date   WBC 4.3 10/27/2016   HGB 11.4 (L) 10/27/2016   HCT 34.3 (L) 10/27/2016   PLT 244.0 10/27/2016   GLUCOSE 98 06/01/2017   CHOL 207 (H) 06/01/2017   TRIG 66.0 06/01/2017   HDL 62.30 06/01/2017   LDLDIRECT 165.2 05/25/2013   LDLCALC 131 (H) 06/01/2017   ALT 29 06/01/2017   AST 19 06/01/2017   NA 138 06/01/2017   K 4.2 06/01/2017   CL 106 06/01/2017   CREATININE 0.86 06/01/2017   BUN 22 06/01/2017   CO2 26 06/01/2017   TSH 1.58 10/27/2016   HGBA1C 5.9 09/29/2007    Ct Abdomen Pelvis W Contrast  Result Date: 09/30/2016 CLINICAL DATA:  Abdominal pain, chronic constipation for 2 weeks, history of diverticulosis EXAM: CT ABDOMEN AND PELVIS WITH CONTRAST TECHNIQUE: Multidetector CT imaging of the abdomen and pelvis was performed using the standard protocol following bolus administration of intravenous contrast. CONTRAST:  153mL ISOVUE-300 IOPAMIDOL (ISOVUE-300) INJECTION 61% COMPARISON:  None. FINDINGS: Lower chest: Lung bases are unremarkable. Hepatobiliary: Mild fatty infiltration of the liver. No focal hepatic mass. No calcified gallstones are noted within gallbladder. Pancreas: Unremarkable Spleen: Enhanced spleen is unremarkable. Adrenals/Urinary Tract: Probable adenoma left adrenal gland measures 1.2 cm. No all hydronephrosis or hydroureter. Enhanced kidneys are symmetrical in size. Delayed renal images shows bilateral renal symmetrical excretion.  Bilateral visualized proximal ureter is unremarkable. Stomach/Bowel: No small bowel obstruction. Abundant stool noted in right colon transverse colon and in proximal descending colon. Moderate stool noted left colon and sigmoid colon. Colonic diverticula are noted left colon and sigmoid colon. No evidence of acute colitis or diverticulitis. No distal colonic obstruction. No pericecal inflammation.  Normal appendix Vascular/Lymphatic: No aortic aneurysm. Atherosclerotic calcifications of abdominal aorta and iliac arteries. No retroperitoneal or mesenteric adenopathy. Reproductive: Calcified uterine fibroid measures 2.8 cm. Other: No ascites or free abdominal air. A pelvic pessary is noted. There is thickening of anterior wall of the urinary bladder. Cystitis or inflammation cannot be excluded. Small bilateral inguinal canal hernia containing fat without evidence of acute complication. Musculoskeletal: There is levoscoliosis of the lumbar spine. Sagittal images of the spine shows multilevel degenerative changes thoracolumbar spine. No destructive bony lesions are noted. IMPRESSION: 1. Abundant stool noted in right colon and transverse colon. Moderate stool noted in distal colon. Colonic diverticula are noted left colon and sigmoid colon. No evidence of acute colitis or diverticulitis. 2. Mild  thickening of anterior wall of urinary bladder. Cystitis or chronic inflammation cannot be excluded. 3. Calcified uterine fibroid measures 2.8 cm. Pelvic pessary is noted. 4. No pericecal inflammation.  Normal appendix. 5. No small bowel obstruction. 6. Probable left adrenal adenoma measures 1.2 cm. Electronically Signed   By: Lahoma Crocker M.D.   On: 09/30/2016 14:18    Assessment & Plan:   There are no diagnoses linked to this encounter. I am having Sophia Santos maintain her Cholecalciferol, conjugated estrogens, topiramate, RESTASIS, meloxicam, captopril, polyethylene glycol powder, MYRBETRIQ, AMITIZA,  butalbital-acetaminophen-caffeine, dipyridamole-aspirin, ezetimibe, Pancrelipase (Lip-Prot-Amyl), LORazepam, propranolol ER, omeprazole, and dipyridamole-aspirin.  No orders of the defined types were placed in this encounter.    Follow-up: No Follow-up on file.  Walker Kehr, MD

## 2017-07-28 ENCOUNTER — Other Ambulatory Visit: Payer: Self-pay | Admitting: Internal Medicine

## 2017-07-28 DIAGNOSIS — L308 Other specified dermatitis: Secondary | ICD-10-CM | POA: Diagnosis not present

## 2017-07-28 DIAGNOSIS — L570 Actinic keratosis: Secondary | ICD-10-CM | POA: Diagnosis not present

## 2017-07-28 DIAGNOSIS — D049 Carcinoma in situ of skin, unspecified: Secondary | ICD-10-CM | POA: Diagnosis not present

## 2017-07-28 MED ORDER — DICYCLOMINE HCL 10 MG PO CAPS: 10.0000 mg | ORAL_CAPSULE | Freq: Three times a day (TID) | ORAL | 5 refills | Status: DC | PRN

## 2017-08-18 DIAGNOSIS — G43719 Chronic migraine without aura, intractable, without status migrainosus: Secondary | ICD-10-CM | POA: Diagnosis not present

## 2017-08-18 DIAGNOSIS — G43019 Migraine without aura, intractable, without status migrainosus: Secondary | ICD-10-CM | POA: Diagnosis not present

## 2017-08-27 DIAGNOSIS — N8111 Cystocele, midline: Secondary | ICD-10-CM | POA: Diagnosis not present

## 2017-08-27 DIAGNOSIS — N3281 Overactive bladder: Secondary | ICD-10-CM | POA: Diagnosis not present

## 2017-08-27 DIAGNOSIS — N952 Postmenopausal atrophic vaginitis: Secondary | ICD-10-CM | POA: Diagnosis not present

## 2017-09-01 ENCOUNTER — Other Ambulatory Visit: Payer: Self-pay | Admitting: Internal Medicine

## 2017-09-18 DIAGNOSIS — R3915 Urgency of urination: Secondary | ICD-10-CM | POA: Diagnosis not present

## 2017-10-01 ENCOUNTER — Other Ambulatory Visit: Payer: Self-pay | Admitting: Dermatology

## 2017-10-01 DIAGNOSIS — D0430 Carcinoma in situ of skin of unspecified part of face: Secondary | ICD-10-CM | POA: Diagnosis not present

## 2017-10-01 DIAGNOSIS — D0439 Carcinoma in situ of skin of other parts of face: Secondary | ICD-10-CM | POA: Diagnosis not present

## 2017-10-02 ENCOUNTER — Other Ambulatory Visit: Payer: Self-pay | Admitting: Internal Medicine

## 2017-10-13 ENCOUNTER — Encounter: Payer: Self-pay | Admitting: Gastroenterology

## 2017-10-13 ENCOUNTER — Ambulatory Visit (INDEPENDENT_AMBULATORY_CARE_PROVIDER_SITE_OTHER): Payer: Medicare Other | Admitting: Gastroenterology

## 2017-10-13 VITALS — BP 144/78 | HR 72 | Ht 60.25 in | Wt 135.1 lb

## 2017-10-13 DIAGNOSIS — R229 Localized swelling, mass and lump, unspecified: Secondary | ICD-10-CM | POA: Diagnosis not present

## 2017-10-13 DIAGNOSIS — K6289 Other specified diseases of anus and rectum: Secondary | ICD-10-CM | POA: Diagnosis not present

## 2017-10-13 DIAGNOSIS — IMO0002 Reserved for concepts with insufficient information to code with codable children: Secondary | ICD-10-CM

## 2017-10-13 MED ORDER — HYDROCORTISONE ACE-PRAMOXINE 2.5-1 % RE CREA: 1.0000 "application " | TOPICAL_CREAM | Freq: Two times a day (BID) | RECTAL | 5 refills | Status: AC

## 2017-10-13 NOTE — Patient Instructions (Addendum)
Pelvis, transvaginal US for ? Mass on rectal exam. You have been scheduled for an abdominal ultrasound at [facility] (1st floor of hospital) on 10/21/17 at 1:30 pm. Please arrive 15 minutes prior to your appointment for registration. Please have a full bladder when you arrive for your test. Should you need to reschedule your appointment, please contact radiology at [phone]. This test typically takes about 30 minutes to perform.  If this is not helpful, then will likely need flexible sigmoidoscopy.  Normal BMI (Body Mass Index- based on height and weight) is between 23 and 30. Your BMI today is Body mass index is 26.17 kg/m. Marland Kitchen Please consider follow up  regarding your BMI with your Primary Care Provider.

## 2017-10-13 NOTE — Progress Notes (Signed)
Review of pertinent gastrointestinal problems: 1. FH of colon cancer, mother:  colonoscopy Dr. Verl Blalock June 2011. He described severe left-sided diverticulosis and a small, diminutive polyp was removed however it was not retrieved. He described that she had a poor prep. This colonoscopy was done for "history of polyps".  Colonoscopy Dr. Verl Blalock 2003 done for routine screening, he wrote that there were "no polyps!!!".  Colonoscopy Dr. Ardis Hughs 2016 found diverticulosis, single inflammatory polyp (by path). No need for further colon cancer screening given age (was 43)    HPI: This is a  very pleasan 83 year old Russian-speaking woman t who was referred to me by Plotnikov, Evie Lacks, MD  to evaluate abdominal pain, rectal discomfort.    Chief complaint is abdominal pain, rectal discomfort  [place] interpreter present but as is usual many of the subleties, nuances of are still lost in translation  Lower abd pains, intermittently.  One of her medicines helps with the pain.  She also takes [place] remedy NOSHBA, she does not know what is in it.  She has a pain at her bottom I believe.  She's had constipation for many years.  Since childbirth.  Currently her constipation is treated very well.    She has a pain at her bottom for about a year.  She has no bleeding.  She has tried prep H type ointment 3 times and it caused burning.    Old Data Reviewed:  CT scan Iv and PO contrast 09/2016: 1. Abundant stool noted in right colon and transverse colon. Moderate stool noted in distal colon. Colonic diverticula are noted left colon and sigmoid colon. No evidence of acute colitis or diverticulitis. 2. Mild thickening of anterior wall of urinary bladder. Cystitis or chronic inflammation cannot be excluded. 3. Calcified uterine fibroid measures 2.8 cm. Pelvic pessary is noted. 4. No pericecal inflammation.  Normal appendix. 5. No small bowel obstruction. 6. Probable left adrenal adenoma  measures 1.2 cm.      Review of systems: Pertinent positive and negative review of systems were noted in the above HPI section. All other review negative.   Past Medical History:  Diagnosis Date  . Arthritis   . Cataract    bilateral eyes removed  . Colon polyp   . Diverticulosis   . GERD (gastroesophageal reflux disease)   . HTN (hypertension)   . Hyperlipidemia   . Irritable bowel syndrome (IBS)   . Osteoarthritis   . Osteopenia   . Osteoporosis     Past Surgical History:  Procedure Laterality Date  . bilerateral carteracts removed      Current Outpatient Prescriptions  Medication Sig Dispense Refill  . AMITIZA 24 MCG capsule TAKE ONE CAPSULE BY MOUTH TWICE A DAY AS NEEDED FOR CONSTIPATION 60 capsule 2  . butalbital-acetaminophen-caffeine (FIORICET) 50-325-40 MG tablet Take 1-2 tablets by mouth 2 (two) times daily as needed for headache. 90 tablet 1  . captopril (CAPOTEN) 100 MG tablet TAKE 1 TABLET (100 MG TOTAL) BY MOUTH 2 (TWO) TIMES DAILY. 180 tablet 2  . Cholecalciferol 1000 UNITS tablet Take 1,000 Units by mouth daily.      Marland Kitchen conjugated estrogens (PREMARIN) vaginal cream Use pv qod x 1 week, then pv q1wk 42.5 g 12  . dicyclomine (BENTYL) 10 MG capsule Take 1 capsule (10 mg total) by mouth 3 (three) times daily as needed for spasms. 120 capsule 5  . dipyridamole-aspirin (AGGRENOX) 200-25 MG 12hr capsule TAKE 1 CAPSULE BY MOUTH 2 (TWO) TIMES DAILY. 180 capsule  1  . ezetimibe (ZETIA) 10 MG tablet Take 1 tablet (10 mg total) by mouth daily. 90 tablet 3  . LORazepam (ATIVAN) 1 MG tablet Take 1-2 tablets (1-2 mg total) by mouth at bedtime as needed. for sleep 60 tablet 1  . meloxicam (MOBIC) 7.5 MG tablet Take 1 tablet (7.5 mg total) by mouth daily. 30 tablet 3  . MYRBETRIQ 25 MG TB24 tablet Take 1 tablet (25 mg total) by mouth daily as needed. 30 tablet 11  . omeprazole (PRILOSEC) 20 MG capsule TAKE 2 CAPSULES (40 MG TOTAL) BY MOUTH DAILY. 180 capsule 2  .  Pancrelipase, Lip-Prot-Amyl, 24000-76000 units CPEP 1 po w/food tid 270 capsule 3  . polyethylene glycol powder (GLYCOLAX/MIRALAX) powder MIX 17 GM AS DIRECTED AND DRINK TWICE DAILY AS NEEDED FOR MODERATE CONSTIPATION 527 g 7  . propranolol ER (INDERAL LA) 120 MG 24 hr capsule TAKE 1 CAPSULE (120 MG TOTAL) BY MOUTH DAILY. 90 capsule 2  . RESTASIS 0.05 % ophthalmic emulsion     . topiramate (TOPAMAX) 50 MG tablet Take 50 mg by mouth daily.  1  . acyclovir (ZOVIRAX) 400 MG tablet Take 1 tablet (400 mg total) by mouth 3 (three) times daily. (Patient not taking: Reported on 10/13/2017) 21 tablet 3   No current facility-administered medications for this visit.     Allergies as of 10/13/2017 - Review Complete 10/13/2017  Allergen Reaction Noted  . Alendronate sodium  01/23/2009  . Fosamax [alendronate sodium]  10/13/2017  . Lipitor [atorvastatin]  04/30/2012  . Simvastatin  11/15/2007  . Voltaren [diclofenac sodium]  07/25/2011    Family History  Problem Relation Age of Onset  . Colon cancer Mother 46  . Stroke Mother   . Parkinson's disease Father   . Dementia Father   . Coronary artery disease Other        female 1st degree <50  . Esophageal cancer Neg Hx   . Rectal cancer Neg Hx   . Stomach cancer Neg Hx     Social History   Social History  . Marital status: Widowed    Spouse name: N/A  . Number of children: 1  . Years of education: N/A   Occupational History  . retired    Social History Main Topics  . Smoking status: Never Smoker  . Smokeless tobacco: Never Used  . Alcohol use No  . Drug use: No  . Sexual activity: No   Other Topics Concern  . Not on file   Social History Narrative   One Boy    Daily Caffeine - tea           Physical Exam: BP (!) 144/78 (BP Location: Left Arm, Patient Position: Sitting, Cuff Size: Normal)   Pulse 72   Ht 5' 0.25" (1.53 m) Comment: height measured without shoes  Wt 135 lb 2 oz (61.3 kg)   BMI 26.17 kg/m  Constitutional:  generally well-appearing Psychiatric: alert and oriented x3 Eyes: extraocular movements intact Mouth: oral pharynx moist, no lesions Neck: supple no lymphadenopathy Cardiovascular: heart regular rate and rhythm Lungs: clear to auscultation bilaterally Abdomen: soft, nontender, nondistended, no obvious ascites, no peritoneal signs, normal bowel sounds Extremities: no lower extremity edema bilaterally Skin: no lesions on visible extremities Rectal examination with female assistant in the room: Very small deflated hemorrhoidal tissue externally, on digital rectal exam felt an anterior wall firmness that seems to be not from within the rectum but external to it.  It was 2 or 3  cm by best estimation.  This did not feel normal to me.  Assessment and plan: 83 y.o. female with rectal discomfort, lower abdominal pains  First I have to admit that a lot of the history here was lost in translation, she is a Russian-speaking only woman and a lot of the subtleties and nuances to nail down here.  She does have some discomfort from within her rectum.  There is a firmness that I could palpate on the anterior wall of her rectum that does not feel like it is an intrinsic mucosal rectal lesion but rather an extrinsic lesion.  It is firm.  It is definitely not feel normal.  Concerned about possible vaginal, uterine pathology I am going to arrange for a transvaginal ultrasound..  If that ultrasound is not helpful but I think we should go ahead with a flexible sigmoidoscopy to exclude rectal lesions.  She did have a colonoscopy 2 years ago with a good retroflex examination that I reviewed again today.    Please see the "Patient Instructions" section for addition details about the plan.   Owens Loffler, MD [facility] 10/13/2017, 2:04 PM  Cc: Cassandria Anger, MD

## 2017-10-21 ENCOUNTER — Ambulatory Visit (HOSPITAL_COMMUNITY)
Admission: RE | Admit: 2017-10-21 | Discharge: 2017-10-21 | Disposition: A | Discharge status: Home / Self Care | Payer: Medicare Other | Source: Ambulatory Visit | Attending: Gastroenterology | Admitting: Gastroenterology

## 2017-10-21 ENCOUNTER — Other Ambulatory Visit: Payer: Self-pay | Admitting: Gastroenterology

## 2017-10-21 DIAGNOSIS — N859 Noninflammatory disorder of uterus, unspecified: Secondary | ICD-10-CM | POA: Insufficient documentation

## 2017-10-21 DIAGNOSIS — R229 Localized swelling, mass and lump, unspecified: Secondary | ICD-10-CM

## 2017-10-21 DIAGNOSIS — K6289 Other specified diseases of anus and rectum: Secondary | ICD-10-CM | POA: Insufficient documentation

## 2017-10-21 DIAGNOSIS — IMO0002 Reserved for concepts with insufficient information to code with codable children: Secondary | ICD-10-CM

## 2017-10-27 DIAGNOSIS — R3915 Urgency of urination: Secondary | ICD-10-CM | POA: Diagnosis not present

## 2017-10-30 ENCOUNTER — Other Ambulatory Visit: Payer: Self-pay | Admitting: Internal Medicine

## 2017-11-03 DIAGNOSIS — Z1231 Encounter for screening mammogram for malignant neoplasm of breast: Secondary | ICD-10-CM | POA: Diagnosis not present

## 2017-11-03 LAB — HM MAMMOGRAPHY

## 2017-11-11 ENCOUNTER — Encounter: Payer: Self-pay | Admitting: Internal Medicine

## 2017-11-18 DIAGNOSIS — Z9841 Cataract extraction status, right eye: Secondary | ICD-10-CM | POA: Diagnosis not present

## 2017-11-18 DIAGNOSIS — Z961 Presence of intraocular lens: Secondary | ICD-10-CM | POA: Diagnosis not present

## 2017-11-18 DIAGNOSIS — Z9842 Cataract extraction status, left eye: Secondary | ICD-10-CM | POA: Diagnosis not present

## 2017-11-18 DIAGNOSIS — H02403 Unspecified ptosis of bilateral eyelids: Secondary | ICD-10-CM | POA: Diagnosis not present

## 2017-11-18 DIAGNOSIS — H0100A Unspecified blepharitis right eye, upper and lower eyelids: Secondary | ICD-10-CM | POA: Diagnosis not present

## 2017-11-25 ENCOUNTER — Ambulatory Visit (AMBULATORY_SURGERY_CENTER): Payer: Medicare Other | Admitting: *Deleted

## 2017-11-25 ENCOUNTER — Other Ambulatory Visit: Payer: Self-pay

## 2017-11-25 ENCOUNTER — Other Ambulatory Visit: Payer: Medicare Other | Admitting: Gastroenterology

## 2017-11-25 VITALS — Ht 62.5 in | Wt 132.0 lb

## 2017-11-25 DIAGNOSIS — K6289 Other specified diseases of anus and rectum: Secondary | ICD-10-CM

## 2017-11-25 NOTE — Progress Notes (Signed)
No egg or soy allergy known to patient  No issues with past sedation with any surgeries  or procedures, no intubation problems  No diet pills per patient No home 02 use per patient  Pt takes  blood thinners per chart- on Aggrenox- do not need to stop this per Dr Ardis Hughs   Pt denies issues with constipation  No A fib or A flutter  No e mail for emmi video  Interpreter in [place] with pt today for [place] language difference - we discussed prep instructions and all questions asked- pt informed multiple times she needs a care partner / driver here the whole 2-3 hours- he cannot leave and she cannot drive herself home

## 2017-11-26 ENCOUNTER — Other Ambulatory Visit: Payer: Self-pay | Admitting: Internal Medicine

## 2017-11-26 ENCOUNTER — Other Ambulatory Visit (INDEPENDENT_AMBULATORY_CARE_PROVIDER_SITE_OTHER): Payer: Medicare Other

## 2017-11-26 DIAGNOSIS — I10 Essential (primary) hypertension: Secondary | ICD-10-CM | POA: Diagnosis not present

## 2017-11-26 DIAGNOSIS — E785 Hyperlipidemia, unspecified: Secondary | ICD-10-CM | POA: Diagnosis not present

## 2017-11-26 DIAGNOSIS — R7989 Other specified abnormal findings of blood chemistry: Secondary | ICD-10-CM | POA: Diagnosis not present

## 2017-11-26 DIAGNOSIS — Z7689 Persons encountering health services in other specified circumstances: Secondary | ICD-10-CM | POA: Diagnosis not present

## 2017-11-26 LAB — BASIC METABOLIC PANEL
BUN: 16 mg/dL (ref 6–23)
CHLORIDE: 104 meq/L (ref 96–112)
CO2: 28 mEq/L (ref 19–32)
Calcium: 8.6 mg/dL (ref 8.4–10.5)
Creatinine, Ser: 0.76 mg/dL (ref 0.40–1.20)
GFR: 77.49 mL/min (ref >60.00)
Glucose, Bld: 99 mg/dL (ref 70–99)
POTASSIUM: 3.9 meq/L (ref 3.5–5.1)
SODIUM: 137 meq/L (ref 135–145)

## 2017-11-26 LAB — URINALYSIS, ROUTINE W REFLEX MICROSCOPIC
Bilirubin Urine: NEGATIVE
Hgb urine dipstick: NEGATIVE
KETONES UR: NEGATIVE
Nitrite: NEGATIVE
SPECIFIC GRAVITY, URINE: 1.015 (ref 1.000–1.030)
Total Protein, Urine: NEGATIVE
Urine Glucose: NEGATIVE
Urobilinogen, UA: 0.2 (ref 0.0–1.0)
pH: 7 (ref 5.0–8.0)

## 2017-11-26 LAB — CBC WITH DIFFERENTIAL/PLATELET
BASOS PCT: 0.6 % (ref 0.0–3.0)
Basophils Absolute: 0 10*3/uL (ref 0.0–0.1)
EOS PCT: 1.4 % (ref 0.0–5.0)
Eosinophils Absolute: 0.1 10*3/uL (ref 0.0–0.7)
HEMATOCRIT: 34.1 % — AB (ref 36.0–46.0)
Hemoglobin: 11.5 g/dL — ABNORMAL LOW (ref 12.0–15.0)
Lymphocytes Relative: 41 % (ref 12.0–46.0)
Lymphs Abs: 1.8 10*3/uL (ref 0.7–4.0)
MCHC: 33.7 g/dL (ref 30.0–36.0)
MCV: 97.8 fl (ref 78.0–100.0)
MONOS PCT: 7.4 % (ref 3.0–12.0)
Monocytes Absolute: 0.3 10*3/uL (ref 0.1–1.0)
Neutro Abs: 2.2 10*3/uL (ref 1.4–7.7)
Neutrophils Relative %: 49.6 % (ref 43.0–77.0)
Platelets: 244 10*3/uL (ref 150.0–400.0)
RBC: 3.49 Mil/uL — AB (ref 3.87–5.11)
RDW: 13.3 % (ref 11.5–15.5)
WBC: 4.4 10*3/uL (ref 4.0–10.5)

## 2017-11-26 LAB — TSH: TSH: 1.17 u[IU]/mL (ref 0.35–4.50)

## 2017-11-26 LAB — LIPID PANEL
CHOL/HDL RATIO: 4
CHOLESTEROL: 204 mg/dL — AB (ref 0–200)
HDL: 57.2 mg/dL (ref >39.00)
LDL CALC: 128 mg/dL — AB (ref 0–99)
NonHDL: 147.1
Triglycerides: 94 mg/dL (ref 0.0–149.0)
VLDL: 18.8 mg/dL (ref 0.0–40.0)

## 2017-11-26 LAB — HEPATIC FUNCTION PANEL
ALT: 14 U/L (ref 0–35)
AST: 14 U/L (ref 0–37)
Albumin: 3.9 g/dL (ref 3.5–5.2)
Alkaline Phosphatase: 82 U/L (ref 39–117)
BILIRUBIN TOTAL: 0.5 mg/dL (ref 0.2–1.2)
Bilirubin, Direct: 0.1 mg/dL (ref 0.0–0.3)
TOTAL PROTEIN: 6.7 g/dL (ref 6.0–8.3)

## 2017-11-27 ENCOUNTER — Other Ambulatory Visit: Payer: Medicare Other | Admitting: Gastroenterology

## 2017-11-28 LAB — IRON,TIBC AND FERRITIN PANEL
Ferritin: 95 ng/mL (ref 15–150)
IRON SATURATION: 41 % (ref 15–55)
Iron: 96 ug/dL (ref 27–139)
TIBC: 236 ug/dL — AB (ref 250–450)
UIBC: 140 ug/dL (ref 118–369)

## 2017-11-28 LAB — CULTURE, URINE COMPREHENSIVE
MICRO NUMBER: 81402393
RESULT:: NO GROWTH
SPECIMEN QUALITY: ADEQUATE

## 2017-11-28 LAB — SPECIMEN STATUS REPORT

## 2017-12-02 ENCOUNTER — Ambulatory Visit (INDEPENDENT_AMBULATORY_CARE_PROVIDER_SITE_OTHER)
Admission: RE | Admit: 2017-12-02 | Discharge: 2017-12-02 | Disposition: A | Discharge status: Home / Self Care | Payer: Medicare Other | Source: Ambulatory Visit | Attending: Internal Medicine | Admitting: Internal Medicine

## 2017-12-02 ENCOUNTER — Encounter: Payer: Self-pay | Admitting: Internal Medicine

## 2017-12-02 ENCOUNTER — Ambulatory Visit (INDEPENDENT_AMBULATORY_CARE_PROVIDER_SITE_OTHER): Payer: Medicare Other | Admitting: Internal Medicine

## 2017-12-02 VITALS — BP 128/78 | HR 62 | Temp 98.3°F | Ht 62.5 in | Wt 132.0 lb

## 2017-12-02 DIAGNOSIS — G43809 Other migraine, not intractable, without status migrainosus: Secondary | ICD-10-CM

## 2017-12-02 DIAGNOSIS — Z23 Encounter for immunization: Secondary | ICD-10-CM

## 2017-12-02 DIAGNOSIS — R1010 Upper abdominal pain, unspecified: Secondary | ICD-10-CM | POA: Diagnosis not present

## 2017-12-02 DIAGNOSIS — Z0000 Encounter for general adult medical examination without abnormal findings: Secondary | ICD-10-CM | POA: Diagnosis not present

## 2017-12-02 DIAGNOSIS — K5900 Constipation, unspecified: Secondary | ICD-10-CM | POA: Diagnosis not present

## 2017-12-02 DIAGNOSIS — K581 Irritable bowel syndrome with constipation: Secondary | ICD-10-CM

## 2017-12-02 DIAGNOSIS — F411 Generalized anxiety disorder: Secondary | ICD-10-CM

## 2017-12-02 DIAGNOSIS — I10 Essential (primary) hypertension: Secondary | ICD-10-CM

## 2017-12-02 MED ORDER — HYDROCORTISONE ACETATE 25 MG RE SUPP: 25.0000 mg | Freq: Every day | RECTAL | 1 refills | Status: AC

## 2017-12-02 NOTE — Assessment & Plan Note (Signed)
Fioricet prn rare use 

## 2017-12-02 NOTE — Patient Instructions (Signed)
Thermapad, Thermacare pads  Capsacin patch

## 2017-12-02 NOTE — Assessment & Plan Note (Addendum)
1 view abd X ray to r/o stool back up F/u w/Dr Ardis Hughs and sigmoidoscopy is pending [facility]

## 2017-12-02 NOTE — Assessment & Plan Note (Signed)
Lorazepam prn  Potential benefits of a long term benzodiazepines  use as well as potential risks  and complications were explained to the patient and were aknowledged.  

## 2017-12-02 NOTE — Assessment & Plan Note (Signed)
Captopril 

## 2017-12-02 NOTE — Progress Notes (Signed)
Subjective:  [name], female    DOB: 01-Dec-1934  Age: 83 y.o. MRN: [id number]  CC: No chief complaint on file.   HPI Sophia Santos presents for a well exam C/o abd pain, bloating C/o pain in the rectal area all the time  Outpatient Medications Prior to Visit  Medication Sig Dispense Refill  . AMITIZA 24 MCG capsule TAKE ONE CAPSULE BY MOUTH TWICE A DAY AS NEEDED FOR CONSTIPATION 60 capsule 2  . butalbital-acetaminophen-caffeine (FIORICET) 50-325-40 MG tablet Take 1-2 tablets by mouth 2 (two) times daily as needed for headache. 90 tablet 1  . captopril (CAPOTEN) 100 MG tablet TAKE 1 TABLET (100 MG TOTAL) BY MOUTH 2 (TWO) TIMES DAILY. 180 tablet 2  . Cholecalciferol 1000 UNITS tablet Take 1,000 Units by mouth daily.      Marland Kitchen conjugated estrogens (PREMARIN) vaginal cream Use pv qod x 1 week, then pv q1wk 42.5 g 12  . dicyclomine (BENTYL) 10 MG capsule Take 1 capsule (10 mg total) by mouth 3 (three) times daily as needed for spasms. 120 capsule 5  . dipyridamole-aspirin (AGGRENOX) 200-25 MG 12hr capsule TAKE 1 CAPSULE BY MOUTH 2 (TWO) TIMES DAILY. 180 capsule 1  . ezetimibe (ZETIA) 10 MG tablet Take 1 tablet (10 mg total) by mouth daily. 90 tablet 3  . LORazepam (ATIVAN) 1 MG tablet Take 1-2 tablets (1-2 mg total) by mouth at bedtime as needed. for sleep 60 tablet 1  . meloxicam (MOBIC) 7.5 MG tablet Take 1 tablet (7.5 mg total) by mouth daily. 30 tablet 3  . MYRBETRIQ 25 MG TB24 tablet Take 1 tablet (25 mg total) by mouth daily as needed. 30 tablet 11  . omeprazole (PRILOSEC) 20 MG capsule TAKE 2 CAPSULES (40 MG TOTAL) BY MOUTH DAILY. 180 capsule 2  . Pancrelipase, Lip-Prot-Amyl, 24000-76000 units CPEP 1 po w/food tid 270 capsule 3  . polyethylene glycol powder (GLYCOLAX/MIRALAX) powder MIX 17 GM AS DIRECTED AND DRINK TWICE DAILY AS NEEDED FOR MODERATE CONSTIPATION 527 g 7  . propranolol ER (INDERAL LA) 120 MG 24 hr capsule TAKE 1 CAPSULE (120 MG TOTAL) BY MOUTH DAILY. 90  capsule 2  . RESTASIS 0.05 % ophthalmic emulsion     . topiramate (TOPAMAX) 50 MG tablet Take 50 mg by mouth daily.  1  . acyclovir (ZOVIRAX) 400 MG tablet Take 1 tablet (400 mg total) by mouth 3 (three) times daily. (Patient not taking: Reported on 10/13/2017) 21 tablet 3   No facility-administered medications prior to visit.     ROS Review of Systems  Constitutional: Positive for fatigue. Negative for activity change, appetite change, chills and unexpected weight change.  HENT: Negative for congestion, mouth sores and sinus pressure.   Eyes: Negative for visual disturbance.  Respiratory: Negative for cough and chest tightness.   Gastrointestinal: Positive for abdominal pain and constipation. Negative for nausea.  Genitourinary: Negative for difficulty urinating, frequency and vaginal pain.  Musculoskeletal: Negative for back pain and gait problem.  Skin: Negative for pallor and rash.  Neurological: Negative for dizziness, tremors, weakness, numbness and headaches.  Psychiatric/Behavioral: Negative for confusion and sleep disturbance. The patient is nervous/anxious.   a/o/c Hesitated w/clock drawing test  Objective:  BP 128/78 (BP Location: Left Arm, Patient Position: Sitting, Cuff Size: Normal)   Pulse 62   Temp 98.3 F (36.8 C) (Oral)   Ht 5' 2.5" (1.588 m)   Wt 132 lb (59.9 kg)   SpO2 100%   BMI 23.76 kg/m  BP Readings from Last 3 Encounters:  12/02/17 128/78  10/13/17 (!) 144/78  06/09/17 126/72    Wt Readings from Last 3 Encounters:  12/02/17 132 lb (59.9 kg)  11/25/17 132 lb (59.9 kg)  10/13/17 135 lb 2 oz (61.3 kg)    Physical Exam  Constitutional: She appears well-developed. No distress.  HENT:  Head: Normocephalic.  Right Ear: External ear normal.  Left Ear: External ear normal.  Nose: Nose normal.  Mouth/Throat: Oropharynx is clear and moist.  Eyes: Conjunctivae are normal. Pupils are equal, round, and reactive to light. Right eye exhibits no  discharge. Left eye exhibits no discharge.  Neck: Normal range of motion. Neck supple. No JVD present. No tracheal deviation present. No thyromegaly present.  Cardiovascular: Normal rate, regular rhythm and normal heart sounds.  Pulmonary/Chest: No stridor. No respiratory distress. She has no wheezes.  Abdominal: Soft. Bowel sounds are normal. She exhibits no distension and no mass. There is no tenderness. There is no rebound and no guarding.  Musculoskeletal: She exhibits no edema or tenderness.  Lymphadenopathy:    She has no cervical adenopathy.  Neurological: She displays normal reflexes. No cranial nerve deficit. She exhibits normal muscle tone. Coordination normal.  Skin: No rash noted. No erythema.  Psychiatric: She has a normal mood and affect. Her behavior is normal. Judgment and thought content normal.    Lab Results  Component Value Date   WBC 4.4 11/26/2017   HGB 11.5 (L) 11/26/2017   HCT 34.1 (L) 11/26/2017   PLT 244.0 11/26/2017   GLUCOSE 99 11/26/2017   CHOL 204 (H) 11/26/2017   TRIG 94.0 11/26/2017   HDL 57.20 11/26/2017   LDLDIRECT 165.2 05/25/2013   LDLCALC 128 (H) 11/26/2017   ALT 14 11/26/2017   AST 14 11/26/2017   NA 137 11/26/2017   K 3.9 11/26/2017   CL 104 11/26/2017   CREATININE 0.76 11/26/2017   BUN 16 11/26/2017   CO2 28 11/26/2017   TSH 1.17 11/26/2017   HGBA1C 5.9 09/29/2007    US Pelvis (transabdominal Only)  Result Date: 10/21/2017 CLINICAL DATA:  Follow-up examination for mass palpated rectal exam. Pessary device in place. EXAM: TRANSABDOMINAL ULTRASOUND OF PELVIS TECHNIQUE: Transabdominal ultrasound examination of the pelvis was performed including evaluation of the uterus, ovaries, adnexal regions, and pelvic cul-de-sac. COMPARISON:  Prior CT from 09/30/2016. FINDINGS: Uterus Measurements: 7.0 x 3.0 x 5.1 cm. Dense calcification measuring 2.0 x 2.2 x 3.2 cm present at the uterine fundus. This most likely reflects an densely calcified uterine  fibroid, also seen on prior CT from 09/30/2016. Endometrium Not visualized due to shadowing from overlying calcification. Right ovary Not visualized.  No adnexal mass. Left ovary Not visualized.  No adnexal mass. Other findings:  No abnormal free fluid. IMPRESSION: 1. 2.0 x 2.2 x 3.2 cm densely calcified lesion within the uterus, most likely reflecting a calcified uterine fibroid. This is similar relative to prior CT from 09/30/2016. Query this as source of palpable abnormality. 2. No other abnormality identified within the pelvis. Nonvisualization of the ovaries. No adnexal mass. Electronically Signed   By: Jeannine Boga M.D.   On: 10/21/2017 23:47    Assessment & Plan:   There are no diagnoses linked to this encounter. I have discontinued Yoceline Aye's acyclovir. I am also having her maintain her Cholecalciferol, conjugated estrogens, topiramate, RESTASIS, meloxicam, butalbital-acetaminophen-caffeine, ezetimibe, Pancrelipase (Lip-Prot-Amyl), LORazepam, omeprazole, MYRBETRIQ, dicyclomine, captopril, polyethylene glycol powder, AMITIZA, dipyridamole-aspirin, and propranolol ER.  No orders of the defined types were  placed in this encounter.    Follow-up: No Follow-up on file.  Walker Kehr, MD

## 2017-12-03 ENCOUNTER — Other Ambulatory Visit: Payer: Self-pay | Admitting: Internal Medicine

## 2017-12-03 MED ORDER — ASPIRIN-DIPYRIDAMOLE ER 25-200 MG PO CP12: 1.0000 | ORAL_CAPSULE | Freq: Two times a day (BID) | ORAL | 3 refills | Status: DC

## 2017-12-03 MED ORDER — OMEPRAZOLE 20 MG PO CPDR: 40.0000 mg | DELAYED_RELEASE_CAPSULE | Freq: Every day | ORAL | 3 refills | Status: DC

## 2017-12-03 MED ORDER — EZETIMIBE 10 MG PO TABS: 10.0000 mg | ORAL_TABLET | Freq: Every day | ORAL | 3 refills | Status: DC

## 2017-12-03 MED ORDER — LORAZEPAM 1 MG PO TABS: 1.0000 mg | ORAL_TABLET | Freq: Every evening | ORAL | 3 refills | Status: DC | PRN

## 2017-12-03 MED ORDER — PROPRANOLOL HCL ER 120 MG PO CP24: 120.0000 mg | ORAL_CAPSULE | Freq: Every day | ORAL | 2 refills | Status: DC

## 2017-12-03 NOTE — Assessment & Plan Note (Signed)
Here for medicare wellness/physical  Diet: heart healthy  Physical activity: not sedentary  Depression/mood screen: - anxiety  Hearing: intact to whispered voice  Visual acuity: grossly normal w/glasses, performs annual eye exam  ADLs: capable  Fall risk: low to none  Home safety: good  Cognitive evaluation: intact to orientation, naming, recall and repetition  EOL planning: adv directives, full code/ I agree  I have personally reviewed and have noted  1. The patient's medical, surgical and social history  2. Their use of alcohol, tobacco or illicit drugs  3. Their current medications and supplements  4. The patient's functional ability including ADL's, fall risks, home safety risks and hearing or visual impairment.  5. Diet and physical activities  6. Evidence for depression or mood disorders 7. The roster of all physicians providing medical care to patient - is listed in the Snapshot section of the chart and reviewed today.    Today patient counseled on age appropriate routine health concerns for screening and prevention, each reviewed and up to date or declined. Immunizations reviewed and up to date or declined. Labs ordered and reviewed. Risk factors for depression reviewed and negative. Hearing function and visual acuity are intact. ADLs screened and addressed as needed. Functional ability and level of safety reviewed and appropriate. Education, counseling and referrals performed based on assessed risks today. Patient provided with a copy of personalized plan for preventive services.

## 2017-12-07 ENCOUNTER — Ambulatory Visit (AMBULATORY_SURGERY_CENTER): Payer: Medicare Other | Admitting: Gastroenterology

## 2017-12-07 ENCOUNTER — Encounter: Payer: Self-pay | Admitting: Gastroenterology

## 2017-12-07 ENCOUNTER — Other Ambulatory Visit: Payer: Self-pay

## 2017-12-07 VITALS — BP 118/80 | HR 68 | Temp 97.5°F | Resp 13 | Ht 62.0 in | Wt 132.0 lb

## 2017-12-07 DIAGNOSIS — K6289 Other specified diseases of anus and rectum: Secondary | ICD-10-CM | POA: Diagnosis present

## 2017-12-07 DIAGNOSIS — K5730 Diverticulosis of large intestine without perforation or abscess without bleeding: Secondary | ICD-10-CM

## 2017-12-07 DIAGNOSIS — K649 Unspecified hemorrhoids: Secondary | ICD-10-CM

## 2017-12-07 MED ORDER — SODIUM CHLORIDE 0.9 % IV SOLN: 500.0000 mL | INTRAVENOUS | Status: DC

## 2017-12-07 NOTE — Op Note (Signed)
[facility] Patient Name: Sophia Santos Procedure Date: 12/07/2017 9:27 AM MRN: [id number] Endoscopist: Milus Banister , MD Age: 83 Referring MD:  Date of Birth: October 16, 1934 Gender: Female Account #: [id number] Procedure:                Flexible Sigmoidoscopy Indications:              Rectal discomfort, palpable abnormality in rectum Medicines:                Monitored Anesthesia Care Procedure:                Pre-Anesthesia Assessment:                           - Prior to the procedure, a History and Physical                            was performed, and patient medications and                            allergies were reviewed. The patient's tolerance of                            previous anesthesia was also reviewed. The risks                            and benefits of the procedure and the sedation                            options and risks were discussed with the patient.                            All questions were answered, and informed consent                            was obtained. Prior Anticoagulants: The patient has                            taken no previous anticoagulant or antiplatelet                            agents. ASA Grade Assessment: III - A patient with                            severe systemic disease. After reviewing the risks                            and benefits, the patient was deemed in                            satisfactory condition to undergo the procedure.                           After obtaining informed consent, the scope was  passed under direct vision. The Colonoscope was                            introduced through the anus and advanced to the the                            descending colon. The flexible sigmoidoscopy was                            accomplished without difficulty. The patient                            tolerated the procedure well. The quality of the                            bowel  preparation was adequate. Scope In: 9:32:04 AM Scope Out: 9:37:27 AM Total Procedure Duration: 0 hours 5 minutes 23 seconds  Findings:                 Many small and large-mouthed diverticula were found                            in the sigmoid colon.                           External and internal hemorrhoids were found. The                            hemorrhoids were small.                           The exam was otherwise without abnormality. Complications:            No immediate complications. Estimated blood loss:                            None. Estimated Blood Loss:     Estimated blood loss: none. Impression:               - Diverticulosis in the sigmoid colon.                           - External and internal hemorrhoids.                           - The examination was otherwise normal.                           - Your rectal discomfort is probably related to the                            Pesary device and your medium sized chronically                            calcified uterine fibroid. Recommendation:           - Patient has a contact number  available for                            emergencies. The signs and symptoms of potential                            delayed complications were discussed with the                            patient. Return to normal activities tomorrow.                            Written discharge instructions were provided to the                            patient.                           - Resume regular diet.                           - Follow up as needed. Milus Banister, MD 12/07/2017 9:42:06 AM This report has been signed electronically.

## 2017-12-07 NOTE — Progress Notes (Signed)
Report to PACU, RN, vss, BBS= Clear.  

## 2017-12-07 NOTE — Progress Notes (Signed)
Interpreter and daughter at bedside to help with translation

## 2017-12-07 NOTE — Patient Instructions (Signed)
YOU HAD AN ENDOSCOPIC PROCEDURE TODAY AT [facility]:   Refer to the procedure report that was given to you for any specific questions about what was found during the examination.  If the procedure report does not answer your questions, please call your gastroenterologist to clarify.  If you requested that your care partner not be given the details of your procedure findings, then the procedure report has been included in a sealed envelope for you to review at your convenience later.  YOU SHOULD EXPECT: Some feelings of bloating in the abdomen. Passage of more gas than usual.  Walking can help get rid of the air that was put into your GI tract during the procedure and reduce the bloating. If you had a lower endoscopy (such as a colonoscopy or flexible sigmoidoscopy) you may notice spotting of blood in your stool or on the toilet paper. If you underwent a bowel prep for your procedure, you may not have a normal bowel movement for a few days.  Please Note:  You might notice some irritation and congestion in your nose or some drainage.  This is from the oxygen used during your procedure.  There is no need for concern and it should clear up in a day or so.  SYMPTOMS TO REPORT IMMEDIATELY:   Following lower endoscopy (colonoscopy or flexible sigmoidoscopy):  Excessive amounts of blood in the stool  Significant tenderness or worsening of abdominal pains  Swelling of the abdomen that is new, acute  Fever of 100F or higher  For urgent or emergent issues, a gastroenterologist can be reached at any hour by calling [phone].  DIET:  We do recommend a small meal at first, but then you may proceed to your regular diet.  Drink plenty of fluids but you should avoid alcoholic beverages for 24 hours.  ACTIVITY:  You should plan to take it easy for the rest of today and you should NOT DRIVE or use heavy machinery until tomorrow (because of the sedation medicines used during the test).     FOLLOW UP: Our staff will call the number listed on your records the next business day following your procedure to check on you and address any questions or concerns that you may have regarding the information given to you following your procedure. If we do not reach you, we will leave a message.  However, if you are feeling well and you are not experiencing any problems, there is no need to return our call.  We will assume that you have returned to your regular daily activities without incident.  SIGNATURES/CONFIDENTIALITY: You and/or your care partner have signed paperwork which will be entered into your electronic medical record.  These signatures attest to the fact that that the information above on your After Visit Summary has been reviewed and is understood.  Full responsibility of the confidentiality of this discharge information lies with you and/or your care-partner.  Please read over handouts about diverticulosis, hemorrhoids, and high fiber diets  Continue your normal medications  Follow up with Dr. Ardis Hughs as needed

## 2017-12-09 ENCOUNTER — Ambulatory Visit: Payer: Medicare Other | Admitting: Internal Medicine

## 2017-12-10 ENCOUNTER — Telehealth: Payer: Self-pay

## 2017-12-10 NOTE — Telephone Encounter (Signed)
  Follow up Call-  Call back number 12/07/2017 09/18/2015  Post procedure Call Back phone  # [phone]  Permission to leave phone message Yes Yes  Some recent data might be hidden     Patient questions:  Do you have a fever, pain , or abdominal swelling? No. Pain Score  0 *  Have you tolerated food without any problems? Yes.    Have you been able to return to your normal activities? Yes.    Do you have any questions about your discharge instructions: Diet   No. Medications  No. Follow up visit  No.  Do you have questions or concerns about your Care? No.  Actions: * If pain score is 4 or above: No action needed, pain <4.

## 2018-01-19 ENCOUNTER — Other Ambulatory Visit: Payer: Self-pay

## 2018-01-19 DIAGNOSIS — E785 Hyperlipidemia, unspecified: Secondary | ICD-10-CM

## 2018-01-19 NOTE — Addendum Note (Signed)
Addended by: Karren Cobble on: 01/19/2018 03:27 PM   Modules accepted: Orders

## 2018-01-21 DIAGNOSIS — K4020 Bilateral inguinal hernia, without obstruction or gangrene, not specified as recurrent: Secondary | ICD-10-CM | POA: Diagnosis not present

## 2018-02-10 DIAGNOSIS — K4020 Bilateral inguinal hernia, without obstruction or gangrene, not specified as recurrent: Secondary | ICD-10-CM | POA: Diagnosis not present

## 2018-02-15 DIAGNOSIS — G43019 Migraine without aura, intractable, without status migrainosus: Secondary | ICD-10-CM | POA: Diagnosis not present

## 2018-02-15 DIAGNOSIS — G43719 Chronic migraine without aura, intractable, without status migrainosus: Secondary | ICD-10-CM | POA: Diagnosis not present

## 2018-02-27 IMAGING — DX DG ABDOMEN 2V
2 series · 2 of 2 positions shown · non-contrast
Comparison: None

CLINICAL DATA: Lower abdominal pain for 3 weeks, history of
diverticulosis at bowel syndrome, hypertension, GERD

EXAM:
ABDOMEN - 2 VIEW

[abdomen erect]
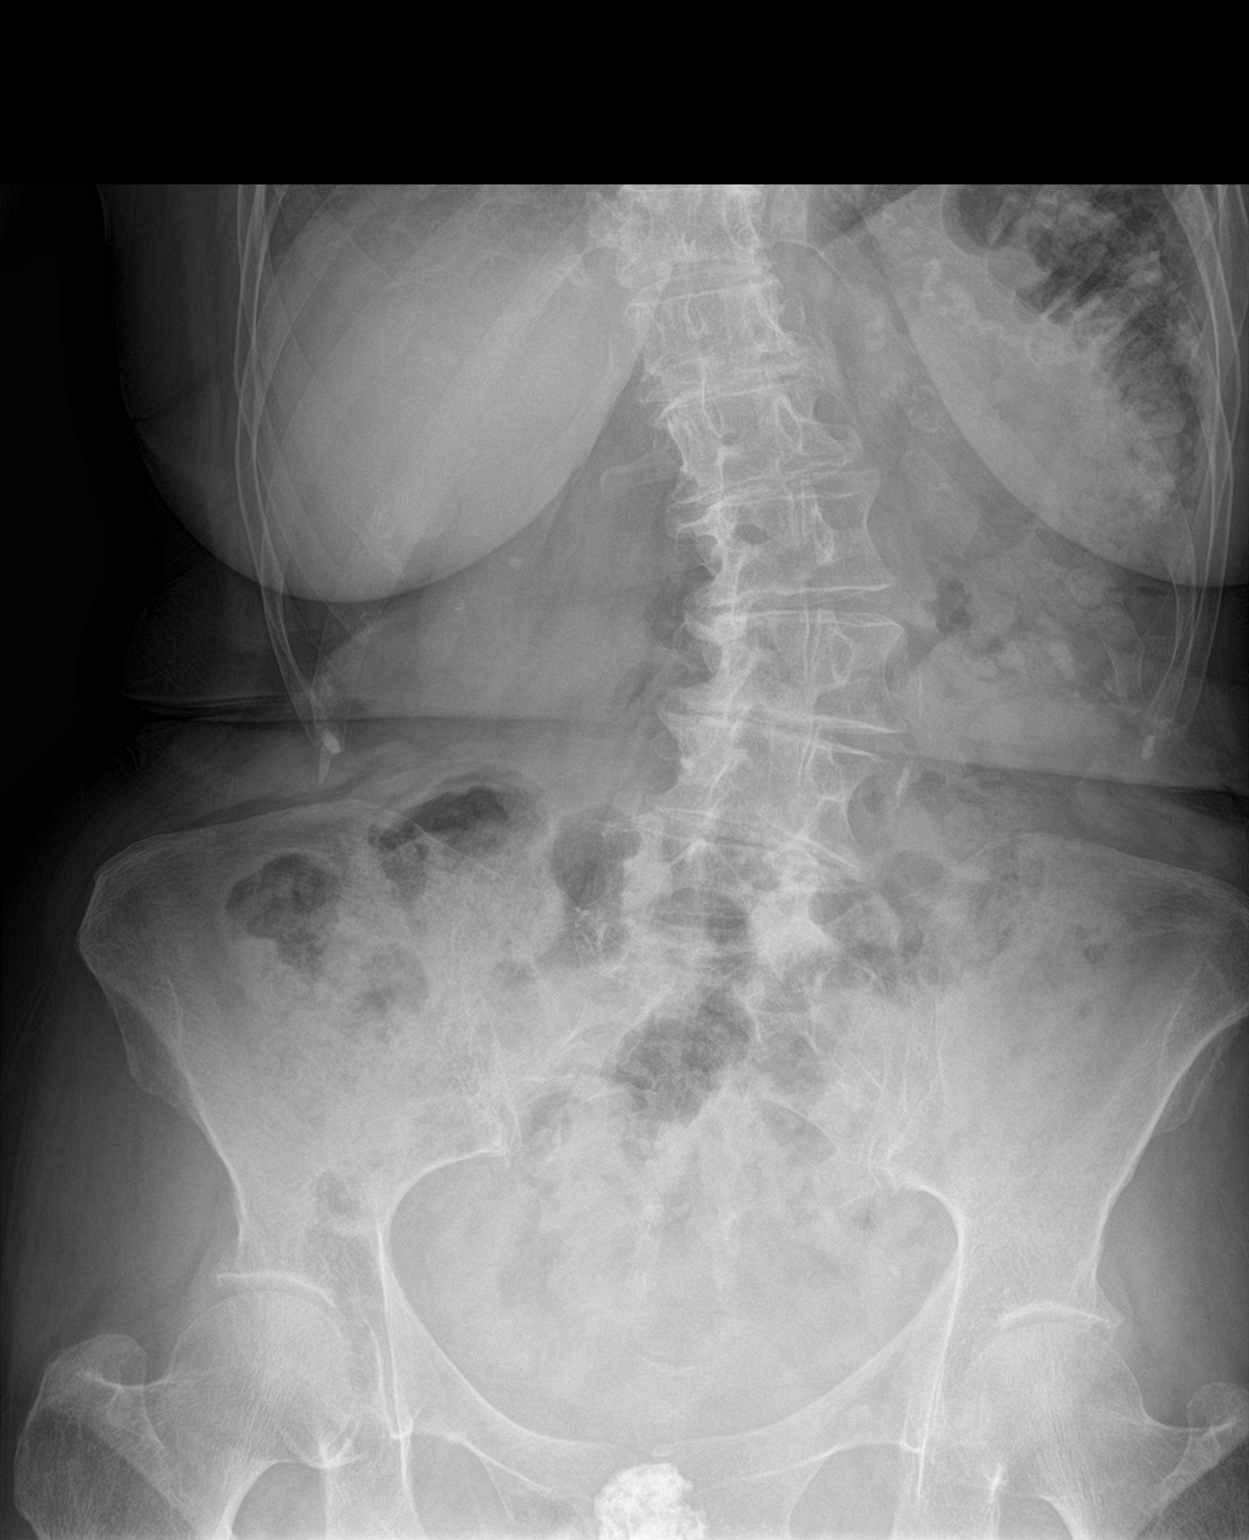

[abdomen supine]
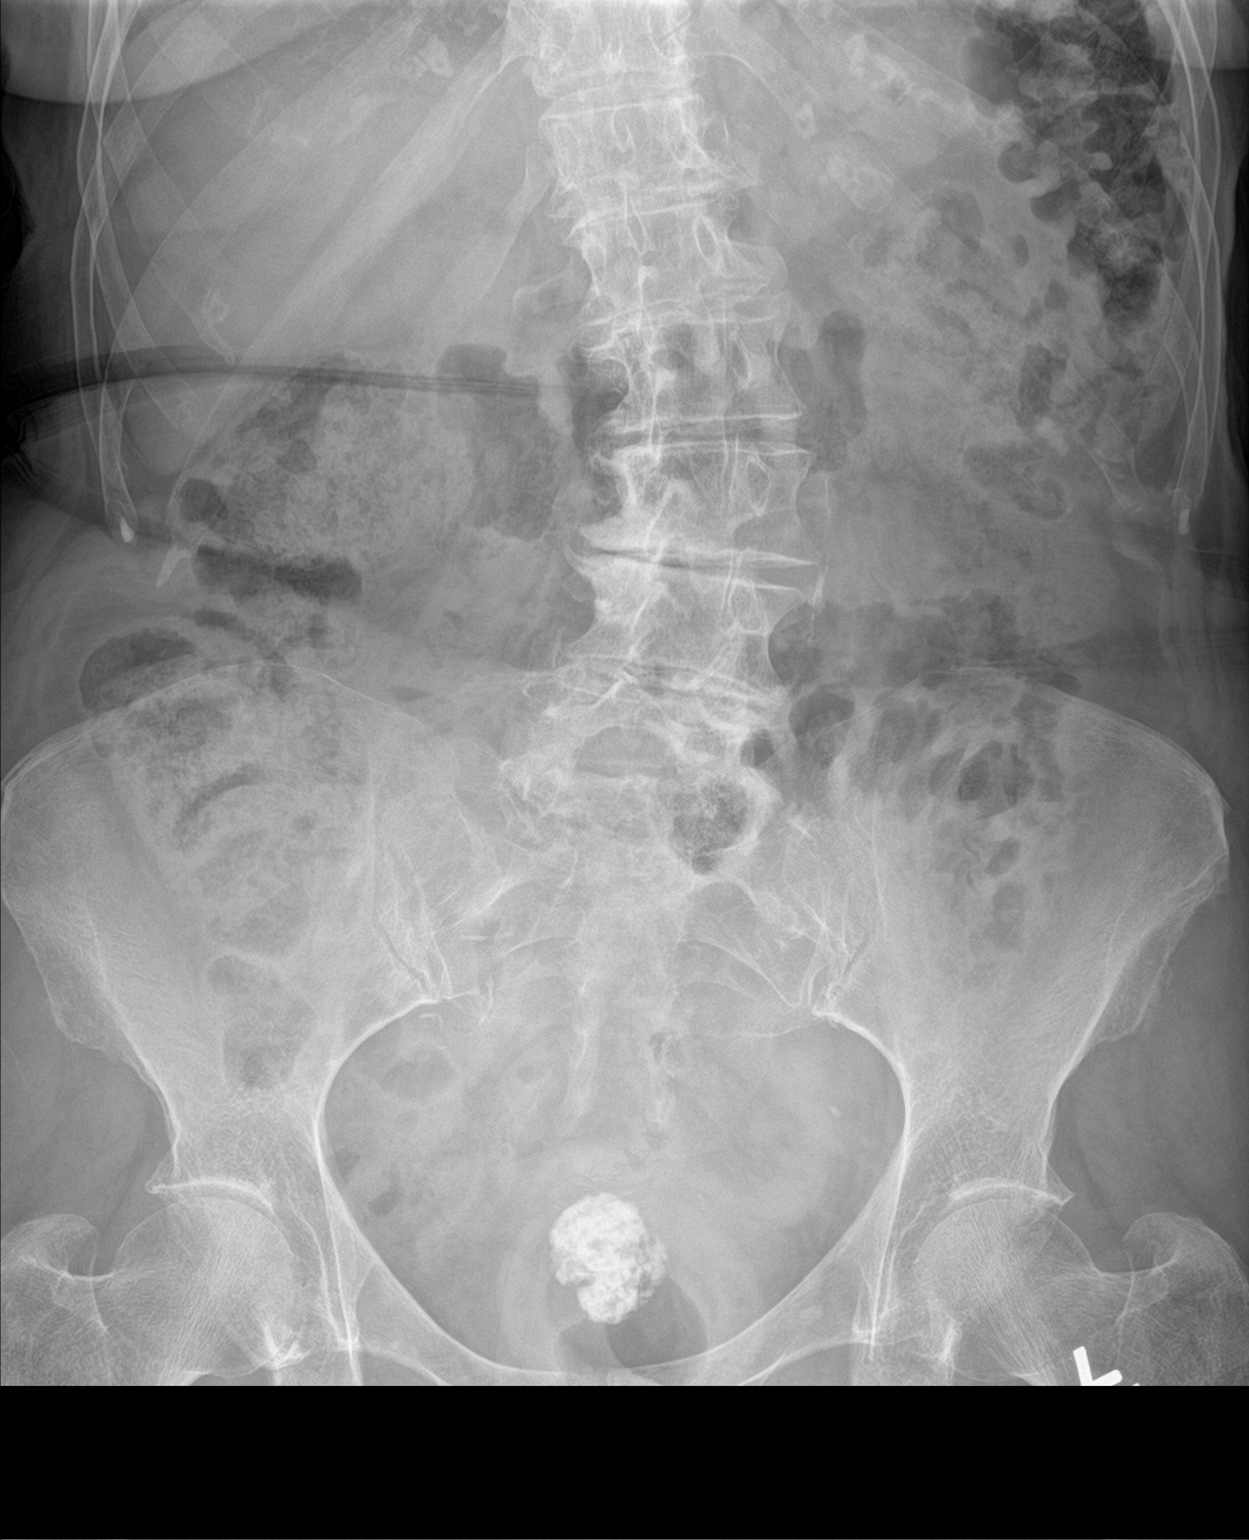

[2 of 2 positions shown; findings below may reference images not displayed]

FINDINGS: Scattered gas and slightly increased stool throughout colon.

Nonobstructive bowel gas pattern without bowel dilatation,, bowel
wall thickening or free air.

Osseous demineralization with degenerative disc disease changes of
the lumbar spine with levoconvex scoliosis.

Atherosclerotic calcification aorta.

Calcific density in the inferior pelvis, probably a calcified
uterine leiomyoma with question adjacent pessary.

Cannot exclude small nonobstructing RIGHT renal calculus 4 mm
diameter.
IMPRESSION: Increased stool in colon.

Nonobstructive bowel gas pattern.

Probable calcified uterine leiomyoma.

Cannot exclude nonobstructing 4 mm RIGHT renal calculus.

## 2018-03-01 ENCOUNTER — Other Ambulatory Visit: Payer: Self-pay | Admitting: Internal Medicine

## 2018-03-02 ENCOUNTER — Ambulatory Visit (INDEPENDENT_AMBULATORY_CARE_PROVIDER_SITE_OTHER): Payer: Medicare Other | Admitting: Internal Medicine

## 2018-03-02 ENCOUNTER — Other Ambulatory Visit (INDEPENDENT_AMBULATORY_CARE_PROVIDER_SITE_OTHER): Payer: Medicare Other

## 2018-03-02 ENCOUNTER — Encounter: Payer: Self-pay | Admitting: Internal Medicine

## 2018-03-02 DIAGNOSIS — K4090 Unilateral inguinal hernia, without obstruction or gangrene, not specified as recurrent: Secondary | ICD-10-CM | POA: Insufficient documentation

## 2018-03-02 DIAGNOSIS — E785 Hyperlipidemia, unspecified: Secondary | ICD-10-CM | POA: Diagnosis not present

## 2018-03-02 DIAGNOSIS — K5900 Constipation, unspecified: Secondary | ICD-10-CM

## 2018-03-02 DIAGNOSIS — K581 Irritable bowel syndrome with constipation: Secondary | ICD-10-CM | POA: Diagnosis not present

## 2018-03-02 DIAGNOSIS — I10 Essential (primary) hypertension: Secondary | ICD-10-CM | POA: Diagnosis not present

## 2018-03-02 DIAGNOSIS — K4020 Bilateral inguinal hernia, without obstruction or gangrene, not specified as recurrent: Secondary | ICD-10-CM

## 2018-03-02 LAB — LIPID PANEL
CHOL/HDL RATIO: 4
CHOLESTEROL: 223 mg/dL — AB (ref 0–200)
HDL: 59.9 mg/dL (ref >39.00)
NONHDL: 162.71
TRIGLYCERIDES: 258 mg/dL — AB (ref 0.0–149.0)
VLDL: 51.6 mg/dL — AB (ref 0.0–40.0)

## 2018-03-02 LAB — BASIC METABOLIC PANEL
BUN: 16 mg/dL (ref 6–23)
CO2: 26 mEq/L (ref 19–32)
CREATININE: 0.75 mg/dL (ref 0.40–1.20)
Calcium: 9 mg/dL (ref 8.4–10.5)
Chloride: 102 mEq/L (ref 96–112)
GFR: 78.64 mL/min (ref >60.00)
Glucose, Bld: 105 mg/dL — ABNORMAL HIGH (ref 70–99)
Potassium: 3.6 mEq/L (ref 3.5–5.1)
Sodium: 134 mEq/L — ABNORMAL LOW (ref 135–145)

## 2018-03-02 LAB — HEPATIC FUNCTION PANEL
ALT: 9 U/L (ref 0–35)
AST: 12 U/L (ref 0–37)
Albumin: 4 g/dL (ref 3.5–5.2)
Alkaline Phosphatase: 65 U/L (ref 39–117)
BILIRUBIN DIRECT: 0.1 mg/dL (ref 0.0–0.3)
BILIRUBIN TOTAL: 0.4 mg/dL (ref 0.2–1.2)
Total Protein: 6.6 g/dL (ref 6.0–8.3)

## 2018-03-02 LAB — LDL CHOLESTEROL, DIRECT: LDL DIRECT: 143 mg/dL

## 2018-03-02 NOTE — Assessment & Plan Note (Signed)
2019 Dr Rosendo Gros - inguinal hernias B R>>L, NT

## 2018-03-02 NOTE — Assessment & Plan Note (Signed)
Chronic OA/LBP

## 2018-03-02 NOTE — Assessment & Plan Note (Signed)
Captopril Risks associated with treatment noncompliance were discussed. Compliance was encouraged. 

## 2018-03-02 NOTE — Patient Instructions (Signed)
????? ? ???????? (Hernia, Adult) ????? ???????????? ????? ??????????? ?????? ??? ????? ????? ?????? ????? ? ?????? ?????? (??????? ??????). ????? ???? ?????????? ?????? ????? ??? ????. ????????? ????? ????? ????. ????? ??????????? ????????? ????:  ????????? ?????. ????? ???? ???? ?????????? ??? ????? ? ??????? ????? ?????.  ??????? ?????. ????? ???? ???? ?????????? ? ??????? ??????? ??? ???????.  ???????? ?????. ????? ???? ???? ?????????? ?????? ?????.  ????? ??????????? ????????? ?????????. ??? ?????? ????? ???? ????? ??????? ?????????????? ?????, ? ??????? ??????.  ??????????? ?????. ??? ?????? ????? ???? ???????????? ???????????? ????? ???? ????? ???????? ?? ??????? ???????. ??????? ????????? ????? ????????? ????? ????:  ?????????? ????????.  ?????? ? ??????? ??????????? ??????? ???????.  ????????????? ??????? ??? ?????????.  ??????, ??????? ???????? ? ???? ????????????? ????????.  ?????????? ?????? (?????? ????????).  ?????????? ??? ??? ????????.  ???????.  ?????? ???????.  ????????????.  ??????? ???????? ? ??????? ???????.  ?????????????? ?????. ???????? ?????????? ????? ????? ????:  ????? ?? ??????. ??? ?????? ??????? ?????. ??? ????? ????? ????? ????? ????? ? ????????? ????, ??? ?????????? ??? ?????. ???? ????? ??? ????????? ?? ????????? ?? ??????, ?? ???????? ????? ????? ?????????????.  ????. ????? ?????? ?????????????, ?? ???? ??????? ????????????? ?? ?????? ?????, ??? ????? ????? ???????????. ????, ??? ???????, ????? ? ????? ???????????? ??? ??????? ??? ?????????? ??????? ?????????. ?????? ????? ?????? ????????? ? ????? ????? (?????????) ??? ????????? ??? (?????????? ?????), ??? ???????? ?????????????? ????????. ?????????? ????? ????????? ????? ????:  ?????.  ???????.  ??????.  ?????????????????. ??????? ????? ????? ???? ??????????????? ? ??????? ??????????:  ?????? ???????????? ????????????. ?? ????? ?????? ???????????? ??? ??????? ???? ?????  ????????? ??? ????????? ??? ??????? ?? ??? ???? ????????, ??? ??? ?????, ??? ???????, ????? ???????, ????? ?? ??????????.  ???????????????? ????????????. ??? ????? ????: ? ?????????????????? ????????????. ? ?????????????? ???????????? (???). ? ??-???????????? (???????????? ??????????). ??????? ????????? ?????????????? ????? ????? ?? ????????? ???????. ??? ??????? ??? ??????????? ????? ????? ???? ????????? ????????????? ???????. ??????? ????? ????? ?????? ????????????? ???????, ????? ????????????? ?????????. ?????????? ????? ?????? ????? ????????????? ?????, ?????? ??? ?????????? ?????????????? ? ??????????? ?????? ??? ????? ????? ???????? ? ?? ?????????. ????????????? ??????? ????? ???????? ?????????? ????????? ??????? ?? ????? ? ?????????? ??????? ????? ??????. ?????????? ?? ????? ? ???????? ????????  ????????? [???????????] ??????????.  ?? ??????? ????????? ???????? ??????? 10 ?????? (4,5 ??).  ??? ??????? [????????] ??????????? ????? ???, ? ?? ?????. ??? ???????? ???????? ??????????.  ??? ????? ?????????? ??????? ?????.  ????????? ????????? ??????. ????? ???????? ? ?????????? ?? ????? ?????????, ??????? ????? ?????????????? ??????????? ????? ??? ??????? ?????? ?????. ?? ?????? ????????????? ????????????? ?????? ????????? ???????: ? ???????????? ???? ? ??????? ??????????? ?????????, ??????? ? ??????? ?????????? ?????????? ? ?????? ? ???????. ? ????? ?????? ?????????, ????? ???? ???? ?????????? ??? ??????-??????? ?????. ?????????? ??????? ? ????????? 6-8 ???????? ???? ?????? ????. ? ??????????? ???????????? ? ???????????? ? ?????????? ?????????????? ?????.  ??? ??????? ????????, ???? ? ??? ????????.  ?? ??????? ??????????? ????? ???????? ??????? ? ??? ????? ????????, ??????????? ????? ??? ??????????? ????????. ???? ??? ?????? ?? ??????? ??? ????? ??????, ?????????? ? ?????? ???????? ?????.  ????????? ? ????? ?? ??? ??????????? ??????. ??? ?????. ?????? ???????? ????? ????? ?????????????  ???????? ?? ????? ??????????.  ?????????? ? ?????, ????:  ? ??? ???????? ????, ??????????? ? ???? ? ?????????? ???????.  ?? ???????? ????????? ? ???????????????? ?????????.  ?????????? ?????????? ? ?????, ????:  ? ??? ?????????? ???????????.  ?? ??????????? ????????????? ???? ? ??????.  ??? ???????????? ??? ?????????? ?????.  ?? ?? ?????? ???????? ????? ?? ?????, ?????? ????? ?? ???, ???????? ? ????????? ????.  ?????: ? ???????? ????? ??? ???????. ? ???????????? ?? ??????? ??????? ??????. ? ?????????? ? ?????. ? ????? ??????? ??? ??????????? ?? ?????.  ??? ?????????? ?? ????? ???????? ??????, ??????????????? ????? ??????. ??????????? ???????? ????? ???????????? ??? ??????? ? ????? ??????? ??????.  Document Released: 12/01/2005 Document Revised: 12/22/2014 Document Reviewed: 10/11/2014 Elsevier Interactive Patient Education  2017 [employer].

## 2018-03-02 NOTE — Assessment & Plan Note (Signed)
Sx: constipation, rectal discomfort (12/18 sigmoidoscopy was ok)

## 2018-03-02 NOTE — Progress Notes (Signed)
Subjective:  [name], female    DOB: November 14, 1934  Age: 84 y.o. MRN: [id number]  CC: No chief complaint on file.   HPI Sophia Santos presents for constipation, rectal discomfort (12/18 sigmoidoscopy was ok) C/o "hernias" - saw a surgeon Pessary was removed by GYN  Outpatient Medications Prior to Visit  Medication Sig Dispense Refill  . AMITIZA 24 MCG capsule TAKE ONE CAPSULE BY MOUTH TWICE A DAY AS NEEDED FOR CONSTIPATION 60 capsule 2  . butalbital-acetaminophen-caffeine (FIORICET) 50-325-40 MG tablet Take 1-2 tablets by mouth 2 (two) times daily as needed for headache. 90 tablet 1  . captopril (CAPOTEN) 100 MG tablet TAKE 1 TABLET (100 MG TOTAL) BY MOUTH 2 (TWO) TIMES DAILY. 180 tablet 2  . Cholecalciferol 1000 UNITS tablet Take 1,000 Units by mouth daily.      Marland Kitchen conjugated estrogens (PREMARIN) vaginal cream Use pv qod x 1 week, then pv q1wk 42.5 g 12  . dicyclomine (BENTYL) 10 MG capsule Take 1 capsule (10 mg total) by mouth 3 (three) times daily as needed for spasms. 120 capsule 5  . dipyridamole-aspirin (AGGRENOX) 200-25 MG 12hr capsule Take 1 capsule by mouth 2 (two) times daily. 180 capsule 3  . ezetimibe (ZETIA) 10 MG tablet Take 1 tablet (10 mg total) by mouth daily. 90 tablet 3  . hydrocortisone (ANUSOL-HC) 25 MG suppository Place 1 suppository (25 mg total) rectally at bedtime. 10 suppository 1  . LORazepam (ATIVAN) 1 MG tablet Take 1-2 tablets (1-2 mg total) by mouth at bedtime as needed. for sleep 60 tablet 3  . meloxicam (MOBIC) 7.5 MG tablet Take 1 tablet (7.5 mg total) by mouth daily. 30 tablet 3  . MYRBETRIQ 25 MG TB24 tablet Take 1 tablet (25 mg total) by mouth daily as needed. 30 tablet 11  . omeprazole (PRILOSEC) 20 MG capsule Take 2 capsules (40 mg total) by mouth daily. 180 capsule 3  . Pancrelipase, Lip-Prot-Amyl, (CREON) 24000-76000 units CPEP TAKE ONE CAPSULE BY MOUTH 3 TIMES A DAY WITH FOOD 270 capsule 3  . polyethylene glycol powder  (GLYCOLAX/MIRALAX) powder MIX 17 GM AS DIRECTED AND DRINK TWICE DAILY AS NEEDED FOR MODERATE CONSTIPATION 527 g 7  . propranolol ER (INDERAL LA) 120 MG 24 hr capsule Take 1 capsule (120 mg total) by mouth daily. 90 capsule 2  . RESTASIS 0.05 % ophthalmic emulsion     . topiramate (TOPAMAX) 50 MG tablet Take 50 mg by mouth daily.  1   Facility-Administered Medications Prior to Visit  Medication Dose Route Frequency Provider Last Rate Last Dose  . 0.9 %  sodium chloride infusion  500 mL Intravenous Continuous Milus Banister, MD        ROS Review of Systems  Constitutional: Negative for activity change, appetite change, chills, fatigue and unexpected weight change.  HENT: Negative for congestion, mouth sores and sinus pressure.   Eyes: Negative for visual disturbance.  Respiratory: Negative for cough and chest tightness.   Gastrointestinal: Positive for abdominal distention, constipation and rectal pain. Negative for abdominal pain and nausea.  Genitourinary: Negative for difficulty urinating, frequency and vaginal pain.  Musculoskeletal: Negative for back pain and gait problem.  Skin: Negative for pallor and rash.  Neurological: Negative for dizziness, tremors, weakness, numbness and headaches.  Psychiatric/Behavioral: Negative for confusion, sleep disturbance and suicidal ideas.    Objective:  BP 118/76 (BP Location: Left Arm, Patient Position: Sitting, Cuff Size: Normal)   Pulse 61   Temp 98.2 F (36.8 C) (  Oral)   Ht 5\' 2"  (1.575 m)   Wt 127 lb (57.6 kg)   SpO2 99%   BMI 23.23 kg/m   BP Readings from Last 3 Encounters:  03/02/18 118/76  12/07/17 118/80  12/02/17 128/78    Wt Readings from Last 3 Encounters:  03/02/18 127 lb (57.6 kg)  12/07/17 132 lb (59.9 kg)  12/02/17 132 lb (59.9 kg)    Physical Exam  Constitutional: She appears well-developed. No distress.  HENT:  Head: Normocephalic.  Right Ear: External ear normal.  Left Ear: External ear normal.  Nose:  Nose normal.  Mouth/Throat: Oropharynx is clear and moist.  Eyes: Conjunctivae are normal. Pupils are equal, round, and reactive to light. Right eye exhibits no discharge. Left eye exhibits no discharge.  Neck: Normal range of motion. Neck supple. No JVD present. No tracheal deviation present. No thyromegaly present.  Cardiovascular: Normal rate, regular rhythm and normal heart sounds.  Pulmonary/Chest: No stridor. No respiratory distress. She has no wheezes.  Abdominal: Soft. Bowel sounds are normal. She exhibits no distension and no mass. There is no tenderness. There is no rebound and no guarding.  Musculoskeletal: She exhibits no edema or tenderness.  Lymphadenopathy:    She has no cervical adenopathy.  Neurological: She displays normal reflexes. No cranial nerve deficit. She exhibits normal muscle tone. Coordination normal.  Skin: No rash noted. No erythema.  Psychiatric: She has a normal mood and affect. Her behavior is normal. Judgment and thought content normal.  inguinal hernias B R>>L, NT  Lab Results  Component Value Date   WBC 4.4 11/26/2017   HGB 11.5 (L) 11/26/2017   HCT 34.1 (L) 11/26/2017   PLT 244.0 11/26/2017   GLUCOSE 99 11/26/2017   CHOL 204 (H) 11/26/2017   TRIG 94.0 11/26/2017   HDL 57.20 11/26/2017   LDLDIRECT 165.2 05/25/2013   LDLCALC 128 (H) 11/26/2017   ALT 14 11/26/2017   AST 14 11/26/2017   NA 137 11/26/2017   K 3.9 11/26/2017   CL 104 11/26/2017   CREATININE 0.76 11/26/2017   BUN 16 11/26/2017   CO2 28 11/26/2017   TSH 1.17 11/26/2017   HGBA1C 5.9 09/29/2007    Dg Abd 1 View  Result Date: 12/02/2017 CLINICAL DATA:  Chronic lower abdominal pain and constipation. EXAM: ABDOMEN - 1 VIEW COMPARISON:  09/03/2016 FINDINGS: The bowel gas pattern is normal. No excessive stool in the colon. No fecal impaction. Stable calcified 3.7 cm fibroid in the uterus. Chronic lumbar scoliosis. Aortic atherosclerosis. IMPRESSION: No acute abnormality of the abdomen.  Specifically, no evidence of excessive stool or fecal impaction. Electronically Signed   By: Lorriane Shire M.D.   On: 12/02/2017 16:49    Assessment & Plan:   There are no diagnoses linked to this encounter. I am having Sophia Santos maintain her Cholecalciferol, conjugated estrogens, topiramate, RESTASIS, meloxicam, butalbital-acetaminophen-caffeine, MYRBETRIQ, dicyclomine, captopril, polyethylene glycol powder, AMITIZA, hydrocortisone, LORazepam, omeprazole, propranolol ER, ezetimibe, dipyridamole-aspirin, and Pancrelipase (Lip-Prot-Amyl). We will continue to administer sodium chloride.  No orders of the defined types were placed in this encounter.    Follow-up: No Follow-up on file.  Walker Kehr, MD

## 2018-03-02 NOTE — Assessment & Plan Note (Signed)
Zetia

## 2018-03-02 NOTE — Assessment & Plan Note (Signed)
constipation, rectal discomfort (12/18 sigmoidoscopy was ok)

## 2018-03-11 ENCOUNTER — Telehealth: Payer: Self-pay | Admitting: Internal Medicine

## 2018-03-11 NOTE — Telephone Encounter (Signed)
Please advise 

## 2018-03-11 NOTE — Telephone Encounter (Signed)
Copied from [place]. Topic: Inquiry >> Mar 11, 2018 10:16 AM Conception Chancy, NT wrote: Patient is calling and states she is sick (no explanation) and needs an antibiotic. I informed the patient that she needed to be seen by her PCP so that he can prescribe her the correct medication. Patient refuses to schedule an appt and states that she can explain to Dr. Alain Marion her situation and he will call her something in. Please advise.

## 2018-03-12 NOTE — Telephone Encounter (Signed)
Pt notified but states she is feeling better today

## 2018-03-12 NOTE — Telephone Encounter (Signed)
Needs to be seen -?[facility] Use OTC Afrin, Robitussin, Tylenol Thx

## 2018-03-13 ENCOUNTER — Ambulatory Visit (INDEPENDENT_AMBULATORY_CARE_PROVIDER_SITE_OTHER): Payer: Medicare Other | Admitting: Family Medicine

## 2018-03-13 VITALS — BP 116/74 | HR 66 | Temp 97.8°F | Wt 126.0 lb

## 2018-03-13 DIAGNOSIS — J019 Acute sinusitis, unspecified: Secondary | ICD-10-CM

## 2018-03-13 MED ORDER — AMOXICILLIN 500 MG PO CAPS: 500.0000 mg | ORAL_CAPSULE | Freq: Three times a day (TID) | ORAL | 0 refills | Status: DC

## 2018-03-13 MED ORDER — BENZONATATE 100 MG PO CAPS: 100.0000 mg | ORAL_CAPSULE | Freq: Two times a day (BID) | ORAL | 0 refills | Status: DC | PRN

## 2018-03-13 NOTE — Progress Notes (Signed)
Subjective:  [name], female    DOB: 03/16/1934  Age: 84 y.o. MRN: [id number]  CC: Cough   HPI Sophia Santos presents for 5 day ho nasal congestion, pnd, cough and rhinorrhea described as white pus. Denies facial pressure or teeth pain. No fever or chills. Tried nasal decongestant without relief. Some sneezing but with no ho allergy rhinitis.   Assisted by translation line.   Outpatient Medications Prior to Visit  Medication Sig Dispense Refill  . AMITIZA 24 MCG capsule TAKE ONE CAPSULE BY MOUTH TWICE A DAY AS NEEDED FOR CONSTIPATION 60 capsule 2  . butalbital-acetaminophen-caffeine (FIORICET) 50-325-40 MG tablet Take 1-2 tablets by mouth 2 (two) times daily as needed for headache. 90 tablet 1  . captopril (CAPOTEN) 100 MG tablet TAKE 1 TABLET (100 MG TOTAL) BY MOUTH 2 (TWO) TIMES DAILY. 180 tablet 2  . Cholecalciferol 1000 UNITS tablet Take 1,000 Units by mouth daily.      Marland Kitchen conjugated estrogens (PREMARIN) vaginal cream Use pv qod x 1 week, then pv q1wk 42.5 g 12  . dicyclomine (BENTYL) 10 MG capsule Take 1 capsule (10 mg total) by mouth 3 (three) times daily as needed for spasms. 120 capsule 5  . dipyridamole-aspirin (AGGRENOX) 200-25 MG 12hr capsule Take 1 capsule by mouth 2 (two) times daily. 180 capsule 3  . ezetimibe (ZETIA) 10 MG tablet Take 1 tablet (10 mg total) by mouth daily. 90 tablet 3  . hydrocortisone (ANUSOL-HC) 25 MG suppository Place 1 suppository (25 mg total) rectally at bedtime. 10 suppository 1  . LORazepam (ATIVAN) 1 MG tablet Take 1-2 tablets (1-2 mg total) by mouth at bedtime as needed. for sleep 60 tablet 3  . meloxicam (MOBIC) 7.5 MG tablet Take 1 tablet (7.5 mg total) by mouth daily. 30 tablet 3  . MYRBETRIQ 25 MG TB24 tablet Take 1 tablet (25 mg total) by mouth daily as needed. 30 tablet 11  . omeprazole (PRILOSEC) 20 MG capsule Take 2 capsules (40 mg total) by mouth daily. 180 capsule 3  . Pancrelipase, Lip-Prot-Amyl, (CREON) 24000-76000 units  CPEP TAKE ONE CAPSULE BY MOUTH 3 TIMES A DAY WITH FOOD 270 capsule 3  . polyethylene glycol powder (GLYCOLAX/MIRALAX) powder MIX 17 GM AS DIRECTED AND DRINK TWICE DAILY AS NEEDED FOR MODERATE CONSTIPATION 527 g 7  . propranolol ER (INDERAL LA) 120 MG 24 hr capsule Take 1 capsule (120 mg total) by mouth daily. 90 capsule 2  . RESTASIS 0.05 % ophthalmic emulsion     . topiramate (TOPAMAX) 50 MG tablet Take 50 mg by mouth daily.  1   Facility-Administered Medications Prior to Visit  Medication Dose Route Frequency Provider Last Rate Last Dose  . 0.9 %  sodium chloride infusion  500 mL Intravenous Continuous Milus Banister, MD        ROS Review of Systems  Constitutional: Negative for chills, fatigue and unexpected weight change.  HENT: Positive for congestion, postnasal drip, rhinorrhea and sneezing. Negative for sinus pressure, sinus pain and sore throat.   Eyes: Negative for photophobia and visual disturbance.  Respiratory: Positive for cough. Negative for chest tightness, shortness of breath and wheezing.   Cardiovascular: Negative.   Endocrine: Negative for polyphagia and polyuria.  Genitourinary: Negative.   Musculoskeletal: Negative for arthralgias and myalgias.  Allergic/Immunologic: Negative for immunocompromised state.  Neurological: Positive for headaches.  Hematological: Does not bruise/bleed easily.  Psychiatric/Behavioral: Negative.     Objective:  BP 116/74 (BP Location: Left Arm, Patient Position:  Sitting, Cuff Size: Normal)   Pulse 66   Temp 97.8 F (36.6 C) (Oral)   Wt 126 lb (57.2 kg)   BMI 23.05 kg/m   BP Readings from Last 3 Encounters:  03/13/18 116/74  03/02/18 118/76  12/07/17 118/80    Wt Readings from Last 3 Encounters:  03/13/18 126 lb (57.2 kg)  03/02/18 127 lb (57.6 kg)  12/07/17 132 lb (59.9 kg)    Physical Exam  Constitutional: She is oriented to person, place, and time. She appears well-developed and well-nourished. No distress.  HENT:    Head: Normocephalic and atraumatic.  Right Ear: External ear normal.  Left Ear: External ear normal.  Mouth/Throat: Oropharynx is clear and moist. No oropharyngeal exudate.  Eyes: Pupils are equal, round, and reactive to light. Conjunctivae are normal. Right eye exhibits no discharge. Left eye exhibits no discharge. No scleral icterus.  Neck: Neck supple. No JVD present. No tracheal deviation present. No thyromegaly present.  Cardiovascular: Normal rate, regular rhythm and normal heart sounds.  Pulmonary/Chest: Effort normal and breath sounds normal. No stridor.  Lymphadenopathy:    She has no cervical adenopathy.  Neurological: She is alert and oriented to person, place, and time.  Skin: Skin is warm and dry. She is not diaphoretic.  Psychiatric: She has a normal mood and affect. Her behavior is normal.    Lab Results  Component Value Date   WBC 4.4 11/26/2017   HGB 11.5 (L) 11/26/2017   HCT 34.1 (L) 11/26/2017   PLT 244.0 11/26/2017   GLUCOSE 105 (H) 03/02/2018   CHOL 223 (H) 03/02/2018   TRIG 258.0 (H) 03/02/2018   HDL 59.90 03/02/2018   LDLDIRECT 143.0 03/02/2018   LDLCALC 128 (H) 11/26/2017   ALT 9 03/02/2018   AST 12 03/02/2018   NA 134 (L) 03/02/2018   K 3.6 03/02/2018   CL 102 03/02/2018   CREATININE 0.75 03/02/2018   BUN 16 03/02/2018   CO2 26 03/02/2018   TSH 1.17 11/26/2017   HGBA1C 5.9 09/29/2007    Dg Abd 1 View  Result Date: 12/02/2017 CLINICAL DATA:  Chronic lower abdominal pain and constipation. EXAM: ABDOMEN - 1 VIEW COMPARISON:  09/03/2016 FINDINGS: The bowel gas pattern is normal. No excessive stool in the colon. No fecal impaction. Stable calcified 3.7 cm fibroid in the uterus. Chronic lumbar scoliosis. Aortic atherosclerosis. IMPRESSION: No acute abnormality of the abdomen. Specifically, no evidence of excessive stool or fecal impaction. Electronically Signed   By: Lorriane Shire M.D.   On: 12/02/2017 16:49    Assessment & Plan:   Sophia Santos was seen  today for cough.  Diagnoses and all orders for this visit:  Acute non-recurrent sinusitis, unspecified location -     amoxicillin (AMOXIL) 500 MG capsule; Take 1 capsule (500 mg total) by mouth 3 (three) times daily. -     benzonatate (TESSALON) 100 MG capsule; Take 1 capsule (100 mg total) by mouth 2 (two) times daily as needed for cough.   I am having Sophia Santos start on amoxicillin and benzonatate. I am also having her maintain her Cholecalciferol, conjugated estrogens, topiramate, RESTASIS, meloxicam, butalbital-acetaminophen-caffeine, MYRBETRIQ, dicyclomine, captopril, polyethylene glycol powder, AMITIZA, hydrocortisone, LORazepam, omeprazole, propranolol ER, ezetimibe, dipyridamole-aspirin, and Pancrelipase (Lip-Prot-Amyl). We will continue to administer sodium chloride.  Meds ordered this encounter  Medications  . amoxicillin (AMOXIL) 500 MG capsule    Sig: Take 1 capsule (500 mg total) by mouth 3 (three) times daily.    Dispense:  30 capsule  Refill:  0  . benzonatate (TESSALON) 100 MG capsule    Sig: Take 1 capsule (100 mg total) by mouth 2 (two) times daily as needed for cough.    Dispense:  20 capsule    Refill:  0     Follow-up: No follow-ups on file.  Libby Maw, MD

## 2018-03-31 DIAGNOSIS — L570 Actinic keratosis: Secondary | ICD-10-CM | POA: Diagnosis not present

## 2018-03-31 DIAGNOSIS — D049 Carcinoma in situ of skin, unspecified: Secondary | ICD-10-CM | POA: Diagnosis not present

## 2018-03-31 DIAGNOSIS — L821 Other seborrheic keratosis: Secondary | ICD-10-CM | POA: Diagnosis not present

## 2018-05-27 ENCOUNTER — Other Ambulatory Visit: Payer: Self-pay | Admitting: Internal Medicine

## 2018-06-04 ENCOUNTER — Ambulatory Visit (INDEPENDENT_AMBULATORY_CARE_PROVIDER_SITE_OTHER): Payer: Medicare Other | Admitting: Internal Medicine

## 2018-06-04 ENCOUNTER — Encounter: Payer: Self-pay | Admitting: Internal Medicine

## 2018-06-04 ENCOUNTER — Other Ambulatory Visit (INDEPENDENT_AMBULATORY_CARE_PROVIDER_SITE_OTHER): Payer: Medicare Other

## 2018-06-04 DIAGNOSIS — K581 Irritable bowel syndrome with constipation: Secondary | ICD-10-CM

## 2018-06-04 DIAGNOSIS — I10 Essential (primary) hypertension: Secondary | ICD-10-CM

## 2018-06-04 DIAGNOSIS — K4020 Bilateral inguinal hernia, without obstruction or gangrene, not specified as recurrent: Secondary | ICD-10-CM

## 2018-06-04 DIAGNOSIS — F411 Generalized anxiety disorder: Secondary | ICD-10-CM

## 2018-06-04 LAB — CBC WITH DIFFERENTIAL/PLATELET
BASOS PCT: 0.6 % (ref 0.0–3.0)
Basophils Absolute: 0 10*3/uL (ref 0.0–0.1)
EOS ABS: 0.1 10*3/uL (ref 0.0–0.7)
EOS PCT: 1.9 % (ref 0.0–5.0)
HCT: 31.9 % — ABNORMAL LOW (ref 36.0–46.0)
Hemoglobin: 10.9 g/dL — ABNORMAL LOW (ref 12.0–15.0)
LYMPHS ABS: 2.2 10*3/uL (ref 0.7–4.0)
Lymphocytes Relative: 45.3 % (ref 12.0–46.0)
MCHC: 34.1 g/dL (ref 30.0–36.0)
MCV: 97.4 fl (ref 78.0–100.0)
Monocytes Absolute: 0.6 10*3/uL (ref 0.1–1.0)
Monocytes Relative: 11.6 % (ref 3.0–12.0)
Neutro Abs: 2 10*3/uL (ref 1.4–7.7)
Neutrophils Relative %: 40.6 % — ABNORMAL LOW (ref 43.0–77.0)
PLATELETS: 233 10*3/uL (ref 150.0–400.0)
RBC: 3.28 Mil/uL — ABNORMAL LOW (ref 3.87–5.11)
RDW: 13.6 % (ref 11.5–15.5)
WBC: 4.9 10*3/uL (ref 4.0–10.5)

## 2018-06-04 LAB — BASIC METABOLIC PANEL
BUN: 19 mg/dL (ref 6–23)
CALCIUM: 8.9 mg/dL (ref 8.4–10.5)
CO2: 26 mEq/L (ref 19–32)
Chloride: 101 mEq/L (ref 96–112)
Creatinine, Ser: 0.83 mg/dL (ref 0.40–1.20)
GFR: 69.91 mL/min (ref >60.00)
Glucose, Bld: 102 mg/dL — ABNORMAL HIGH (ref 70–99)
POTASSIUM: 3.7 meq/L (ref 3.5–5.1)
SODIUM: 133 meq/L — AB (ref 135–145)

## 2018-06-04 MED ORDER — DICYCLOMINE HCL 10 MG PO CAPS: 10.0000 mg | ORAL_CAPSULE | Freq: Three times a day (TID) | ORAL | 5 refills | Status: DC | PRN

## 2018-06-04 MED ORDER — ALPRAZOLAM 1 MG PO TABS: 1.0000 mg | ORAL_TABLET | Freq: Two times a day (BID) | ORAL | 5 refills | Status: DC | PRN

## 2018-06-04 MED ORDER — EZETIMIBE 10 MG PO TABS: 10.0000 mg | ORAL_TABLET | Freq: Every day | ORAL | 3 refills | Status: DC

## 2018-06-04 NOTE — Assessment & Plan Note (Signed)
F/u w/ Dr Rosendo Gros if worse

## 2018-06-04 NOTE — Assessment & Plan Note (Signed)
Lorazepam prn  Potential benefits of a long term benzodiazepines  use as well as potential risks  and complications were explained to the patient and were aknowledged.  

## 2018-06-04 NOTE — Progress Notes (Signed)
Subjective:  [name], female    DOB: 1934/07/21  Age: 84 y.o. MRN: [id number]  CC: No chief complaint on file.   HPI Sophia Santos presents for HTN, dyslipidemia, IBS - constipation f/u  Outpatient Medications Prior to Visit  Medication Sig Dispense Refill  . AMITIZA 24 MCG capsule TAKE ONE CAPSULE BY MOUTH TWICE A DAY AS NEEDED FOR CONSTIPATION 60 capsule 2  . butalbital-acetaminophen-caffeine (FIORICET) 50-325-40 MG tablet Take 1-2 tablets by mouth 2 (two) times daily as needed for headache. 90 tablet 1  . captopril (CAPOTEN) 100 MG tablet TAKE 1 TABLET (100 MG TOTAL) BY MOUTH 2 (TWO) TIMES DAILY. 180 tablet 2  . Cholecalciferol 1000 UNITS tablet Take 1,000 Units by mouth daily.      Marland Kitchen conjugated estrogens (PREMARIN) vaginal cream Use pv qod x 1 week, then pv q1wk 42.5 g 12  . dicyclomine (BENTYL) 10 MG capsule Take 1 capsule (10 mg total) by mouth 3 (three) times daily as needed for spasms. 120 capsule 5  . dipyridamole-aspirin (AGGRENOX) 200-25 MG 12hr capsule Take 1 capsule by mouth 2 (two) times daily. 180 capsule 3  . ezetimibe (ZETIA) 10 MG tablet Take 1 tablet (10 mg total) by mouth daily. 90 tablet 3  . hydrocortisone (ANUSOL-HC) 25 MG suppository Place 1 suppository (25 mg total) rectally at bedtime. 10 suppository 1  . LORazepam (ATIVAN) 1 MG tablet Take 1-2 tablets (1-2 mg total) by mouth at bedtime as needed. for sleep 60 tablet 3  . meloxicam (MOBIC) 7.5 MG tablet Take 1 tablet (7.5 mg total) by mouth daily. 30 tablet 3  . MYRBETRIQ 25 MG TB24 tablet Take 1 tablet (25 mg total) by mouth daily as needed. 30 tablet 11  . omeprazole (PRILOSEC) 20 MG capsule Take 2 capsules (40 mg total) by mouth daily. 180 capsule 3  . Pancrelipase, Lip-Prot-Amyl, (CREON) 24000-76000 units CPEP TAKE ONE CAPSULE BY MOUTH 3 TIMES A DAY WITH FOOD 270 capsule 3  . polyethylene glycol powder (GLYCOLAX/MIRALAX) powder MIX 17 GM AS DIRECTED AND DRINK TWICE DAILY AS NEEDED FOR MODERATE  CONSTIPATION 527 g 7  . propranolol ER (INDERAL LA) 120 MG 24 hr capsule Take 1 capsule (120 mg total) by mouth daily. 90 capsule 2  . RESTASIS 0.05 % ophthalmic emulsion     . topiramate (TOPAMAX) 50 MG tablet Take 50 mg by mouth daily.  1  . amoxicillin (AMOXIL) 500 MG capsule Take 1 capsule (500 mg total) by mouth 3 (three) times daily. (Patient not taking: Reported on 06/04/2018) 30 capsule 0  . benzonatate (TESSALON) 100 MG capsule Take 1 capsule (100 mg total) by mouth 2 (two) times daily as needed for cough. (Patient not taking: Reported on 06/04/2018) 20 capsule 0   Facility-Administered Medications Prior to Visit  Medication Dose Route Frequency Provider Last Rate Last Dose  . 0.9 %  sodium chloride infusion  500 mL Intravenous Continuous Milus Banister, MD        ROS: Review of Systems  Constitutional: Negative for activity change, appetite change, chills, fatigue and unexpected weight change.  HENT: Negative for congestion, mouth sores and sinus pressure.   Eyes: Negative for visual disturbance.  Respiratory: Negative for cough and chest tightness.   Gastrointestinal: Positive for abdominal pain and constipation. Negative for diarrhea, nausea and vomiting.  Genitourinary: Negative for difficulty urinating, frequency and vaginal pain.  Musculoskeletal: Negative for back pain and gait problem.  Skin: Negative for pallor and rash.  Neurological:  Negative for dizziness, tremors, weakness, numbness and headaches.  Psychiatric/Behavioral: Negative for confusion and sleep disturbance.    Objective:  BP 130/76 (BP Location: Left Arm, Patient Position: Sitting, Cuff Size: Normal)   Pulse 62   Ht 5\' 2"  (1.575 m)   Wt 126 lb (57.2 kg)   SpO2 98%   BMI 23.05 kg/m   BP Readings from Last 3 Encounters:  06/04/18 130/76  03/13/18 116/74  03/02/18 118/76    Wt Readings from Last 3 Encounters:  06/04/18 126 lb (57.2 kg)  03/13/18 126 lb (57.2 kg)  03/02/18 127 lb (57.6 kg)     Physical Exam  Constitutional: She appears well-developed. No distress.  HENT:  Head: Normocephalic.  Right Ear: External ear normal.  Left Ear: External ear normal.  Nose: Nose normal.  Mouth/Throat: Oropharynx is clear and moist.  Eyes: Pupils are equal, round, and reactive to light. Conjunctivae are normal. Right eye exhibits no discharge. Left eye exhibits no discharge.  Neck: Normal range of motion. Neck supple. No JVD present. No tracheal deviation present. No thyromegaly present.  Cardiovascular: Normal rate, regular rhythm and normal heart sounds.  Pulmonary/Chest: No stridor. No respiratory distress. She has no wheezes.  Abdominal: Soft. Bowel sounds are normal. She exhibits no distension and no mass. There is no tenderness. There is no rebound and no guarding. A hernia is present.  Musculoskeletal: She exhibits tenderness. She exhibits no edema.  Lymphadenopathy:    She has no cervical adenopathy.  Neurological: She displays normal reflexes. No cranial nerve deficit. She exhibits normal muscle tone. Coordination normal.  Skin: No rash noted. No erythema.  Psychiatric: She has a normal mood and affect. Her behavior is normal. Judgment and thought content normal.  B inguinal hernia NT  Lab Results  Component Value Date   WBC 4.4 11/26/2017   HGB 11.5 (L) 11/26/2017   HCT 34.1 (L) 11/26/2017   PLT 244.0 11/26/2017   GLUCOSE 105 (H) 03/02/2018   CHOL 223 (H) 03/02/2018   TRIG 258.0 (H) 03/02/2018   HDL 59.90 03/02/2018   LDLDIRECT 143.0 03/02/2018   LDLCALC 128 (H) 11/26/2017   ALT 9 03/02/2018   AST 12 03/02/2018   NA 134 (L) 03/02/2018   K 3.6 03/02/2018   CL 102 03/02/2018   CREATININE 0.75 03/02/2018   BUN 16 03/02/2018   CO2 26 03/02/2018   TSH 1.17 11/26/2017   HGBA1C 5.9 09/29/2007    Dg Abd 1 View  Result Date: 12/02/2017 CLINICAL DATA:  Chronic lower abdominal pain and constipation. EXAM: ABDOMEN - 1 VIEW COMPARISON:  09/03/2016 FINDINGS: The bowel  gas pattern is normal. No excessive stool in the colon. No fecal impaction. Stable calcified 3.7 cm fibroid in the uterus. Chronic lumbar scoliosis. Aortic atherosclerosis. IMPRESSION: No acute abnormality of the abdomen. Specifically, no evidence of excessive stool or fecal impaction. Electronically Signed   By: Lorriane Shire M.D.   On: 12/02/2017 16:49    Assessment & Plan:   There are no diagnoses linked to this encounter.   No orders of the defined types were placed in this encounter.    Follow-up: No follow-ups on file.  Walker Kehr, MD

## 2018-06-04 NOTE — Assessment & Plan Note (Signed)
IBS-C Miralax, Amitiza - not taking, Hyoscimineprn Sx: constipation, rectal discomfort (12/18 sigmoidoscopy was ok) F/u w/Dr Ardis Hughs if worse

## 2018-06-04 NOTE — Assessment & Plan Note (Signed)
Captopril Risks associated with treatment noncompliance were discussed. Compliance was encouraged. 

## 2018-08-27 DIAGNOSIS — Z9842 Cataract extraction status, left eye: Secondary | ICD-10-CM | POA: Diagnosis not present

## 2018-08-27 DIAGNOSIS — Z961 Presence of intraocular lens: Secondary | ICD-10-CM | POA: Diagnosis not present

## 2018-08-27 DIAGNOSIS — H0100A Unspecified blepharitis right eye, upper and lower eyelids: Secondary | ICD-10-CM | POA: Diagnosis not present

## 2018-08-27 DIAGNOSIS — H0100B Unspecified blepharitis left eye, upper and lower eyelids: Secondary | ICD-10-CM | POA: Diagnosis not present

## 2018-08-27 DIAGNOSIS — Z9841 Cataract extraction status, right eye: Secondary | ICD-10-CM | POA: Diagnosis not present

## 2018-09-22 DIAGNOSIS — G43719 Chronic migraine without aura, intractable, without status migrainosus: Secondary | ICD-10-CM | POA: Diagnosis not present

## 2018-09-22 DIAGNOSIS — G43019 Migraine without aura, intractable, without status migrainosus: Secondary | ICD-10-CM | POA: Diagnosis not present

## 2018-09-28 ENCOUNTER — Other Ambulatory Visit: Payer: Self-pay | Admitting: Dermatology

## 2018-09-28 DIAGNOSIS — D0439 Carcinoma in situ of skin of other parts of face: Secondary | ICD-10-CM | POA: Diagnosis not present

## 2018-09-28 DIAGNOSIS — C44321 Squamous cell carcinoma of skin of nose: Secondary | ICD-10-CM | POA: Diagnosis not present

## 2018-09-28 DIAGNOSIS — L820 Inflamed seborrheic keratosis: Secondary | ICD-10-CM | POA: Diagnosis not present

## 2018-09-28 DIAGNOSIS — D485 Neoplasm of uncertain behavior of skin: Secondary | ICD-10-CM | POA: Diagnosis not present

## 2018-10-06 ENCOUNTER — Ambulatory Visit (INDEPENDENT_AMBULATORY_CARE_PROVIDER_SITE_OTHER): Payer: Medicare Other | Admitting: Internal Medicine

## 2018-10-06 ENCOUNTER — Encounter: Payer: Self-pay | Admitting: Internal Medicine

## 2018-10-06 VITALS — BP 122/72 | HR 63 | Temp 97.6°F | Ht 62.0 in | Wt 128.0 lb

## 2018-10-06 DIAGNOSIS — Z780 Asymptomatic menopausal state: Secondary | ICD-10-CM

## 2018-10-06 DIAGNOSIS — R7989 Other specified abnormal findings of blood chemistry: Secondary | ICD-10-CM | POA: Diagnosis not present

## 2018-10-06 DIAGNOSIS — K4020 Bilateral inguinal hernia, without obstruction or gangrene, not specified as recurrent: Secondary | ICD-10-CM

## 2018-10-06 DIAGNOSIS — Z23 Encounter for immunization: Secondary | ICD-10-CM | POA: Diagnosis not present

## 2018-10-06 DIAGNOSIS — F411 Generalized anxiety disorder: Secondary | ICD-10-CM

## 2018-10-06 DIAGNOSIS — R1010 Upper abdominal pain, unspecified: Secondary | ICD-10-CM

## 2018-10-06 DIAGNOSIS — E785 Hyperlipidemia, unspecified: Secondary | ICD-10-CM

## 2018-10-06 DIAGNOSIS — I10 Essential (primary) hypertension: Secondary | ICD-10-CM

## 2018-10-06 MED ORDER — POLYETHYLENE GLYCOL 3350 17 GM/SCOOP PO POWD: ORAL | 5 refills | Status: DC

## 2018-10-06 MED ORDER — BUTALBITAL-APAP-CAFFEINE 50-325-40 MG PO TABS: 1.0000 | ORAL_TABLET | Freq: Two times a day (BID) | ORAL | 1 refills | Status: DC | PRN

## 2018-10-06 MED ORDER — PROPRANOLOL HCL ER 120 MG PO CP24: 120.0000 mg | ORAL_CAPSULE | Freq: Every day | ORAL | 3 refills | Status: DC

## 2018-10-06 MED ORDER — ZOSTER VAC RECOMB ADJUVANTED 50 MCG/0.5ML IM SUSR: 0.5000 mL | Freq: Once | INTRAMUSCULAR | 1 refills | Status: AC

## 2018-10-06 MED ORDER — EZETIMIBE 10 MG PO TABS: 10.0000 mg | ORAL_TABLET | Freq: Every day | ORAL | 3 refills | Status: DC

## 2018-10-06 MED ORDER — CAPTOPRIL 50 MG PO TABS: 50.0000 mg | ORAL_TABLET | Freq: Two times a day (BID) | ORAL | 3 refills | Status: DC

## 2018-10-06 MED ORDER — PRAVASTATIN SODIUM 20 MG PO TABS: 20.0000 mg | ORAL_TABLET | Freq: Every day | ORAL | 11 refills | Status: DC

## 2018-10-06 NOTE — Assessment & Plan Note (Signed)
Lorazepam prn  Potential benefits of a long term benzodiazepines  use as well as potential risks  and complications were explained to the patient and were aknowledged.  

## 2018-10-06 NOTE — Patient Instructions (Signed)
  Gluten free trial for 4-6 weeks. OK to use gluten-free bread and gluten-free pasta.    Gluten-Free Diet for Celiac Disease, Adult The gluten-free diet includes all foods that do not contain gluten. Gluten is a protein that is found in wheat, rye, barley, and some other grains. Following the gluten-free diet is the only treatment for people with celiac disease. It helps to prevent damage to the intestines and improves or eliminates the symptoms of celiac disease. Following the gluten-free diet requires some planning. It can be challenging at first, but it gets easier with time and practice. There are more gluten-free options available today than ever before. If you need help finding gluten-free foods or if you have questions, talk with your diet and nutrition specialist (registered dietitian) or your health care provider. What do I need to know about a gluten-free diet?  All fruits, vegetables, and meats are safe to eat and do not contain gluten.  When grocery shopping, start by shopping in the produce, meat, and dairy sections. These sections are more likely to contain gluten-free foods. Then move to the aisles that contain packaged foods if you need to.  Read all food labels. Gluten is often added to foods. Always check the ingredient list and look for warnings, such as "may contain gluten."  Talk with your dietitian or health care provider before taking a gluten-free multivitamin or mineral supplement.  Be aware of gluten-free foods having contact with foods that contain gluten (cross-contamination). This can happen at home and with any processed foods. ? Talk with your health care provider or dietitian about how to reduce the risk of cross-contamination in your home. ? If you have questions about how a food is processed, ask the manufacturer. What key words help to identify gluten? Foods that list any of these key words on the label usually contain gluten:  Wheat, flour, enriched  flour, bromated flour, white flour, durum flour, graham flour, phosphated flour, self-rising flour, semolina, farina, barley (malt), rye, and oats.  Starch, dextrin, modified food starch, or cereal.  Thickening, fillers, or emulsifiers.  Malt flavoring, malt extract, or malt syrup.  Hydrolyzed vegetable protein.  In the [place] packaged foods that are gluten-free are required to be labeled "GF." These foods should be easy to identify and are safe to eat. In the [place] food companies are also required to list common food allergens, including wheat, on their labels. Recommended foods Grains  Amaranth, bean flours, 100% buckwheat flour, corn, millet, nut flours or nut meals, GF oats, quinoa, rice, sorghum, teff, rice wafers, pure cornmeal tortillas, popcorn, and hot cereals made from cornmeal. Hominy, rice, wild rice. Some Asian rice noodles or bean noodles. Arrowroot starch, corn bran, corn flour, corn germ, cornmeal, corn starch, potato flour, potato starch flour, and rice bran. Plain, brown, and sweet rice flours. Rice polish, soy flour, and tapioca starch. Vegetables  All plain fresh, frozen, and canned vegetables. Fruits  All plain fresh, frozen, canned, and dried fruits, and 100% fruit juices. Meats and other protein foods  All fresh beef, pork, poultry, fish, seafood, and eggs. Fish canned in water, oil, brine, or vegetable broth. Plain nuts and seeds, peanut butter. Some lunch meat and some frankfurters. Dried beans, dried peas, and lentils. Dairy  Fresh plain, dry, evaporated, or condensed milk. Cream, butter, sour cream, whipping cream, and most yogurts. Unprocessed cheese, most processed cheeses, some cottage cheese, some cream cheeses. Beverages  Coffee, tea, most herbal teas. Carbonated beverages and some root beers.   Wine, sake, and distilled spirits, such as gin, vodka, and whiskey. Most hard ciders. Fats and oils  Butter, margarine, vegetable oil, hydrogenated butter, olive  oil, shortening, lard, cream, and some mayonnaise. Some commercial salad dressings. Olives. Sweets and desserts  Sugar, honey, some syrups, molasses, jelly, and jam. Plain hard candy, marshmallows, and gumdrops. Pure cocoa powder. Plain chocolate. Custard and some pudding mixes. Gelatin desserts, sorbets, frozen ice pops, and sherbet. Cake, cookies, and other desserts prepared with allowed flours. Some commercial ice creams. Cornstarch, tapioca, and rice puddings. Seasoning and other foods  Some canned or frozen soups. Monosodium glutamate (MSG). Cider, rice, and wine vinegar. Baking soda and baking powder. Cream of tartar. Baking and nutritional yeast. Certain soy sauces made without wheat (ask your dietitian about specific brands that are allowed). Nuts, coconut, and chocolate. Salt, pepper, herbs, spices, flavoring extracts, imitation or artificial flavorings, natural flavorings, and food colorings. Some medicines and supplements. Some lip glosses and other cosmetics. Rice syrups. The items listed may not be a complete list. Talk with your dietitian about what dietary choices are best for you. Foods to avoid Grains  Barley, bran, bulgur, couscous, cracked wheat, [place], farro, graham, malt, matzo, semolina, wheat germ, and all wheat and rye cereals including spelt and kamut. Cereals containing malt as a flavoring, such as rice cereal. Noodles, spaghetti, macaroni, most packaged rice mixes, and all mixes containing wheat, rye, barley, or triticale. Vegetables  Most creamed vegetables and most vegetables canned in sauces. Some commercially prepared vegetables and salads. Fruits  Thickened or prepared fruits and some pie fillings. Some fruit snacks and fruit roll-ups. Meats and other protein foods  Any meat or meat alternative containing wheat, rye, barley, or gluten stabilizers. These are often marinated or packaged meats and lunch meats. Bread-containing products, such as Swiss steak,  croquettes, meatballs, and meatloaf. Most tuna canned in vegetable broth and turkey with hydrolyzed vegetable protein (HVP) injected as part of the basting. Seitan. Imitation fish. Eggs in sauces made from ingredients to avoid. Dairy  Commercial chocolate milk drinks and malted milk. Some non-dairy creamers. Any cheese product containing ingredients to avoid. Beverages  Certain cereal beverages. Beer, ale, malted milk, and some root beers. Some hard ciders. Some instant flavored coffees. Some herbal teas made with barley or with barley malt added. Fats and oils  Some commercial salad dressings. Sour cream containing modified food starch. Sweets and desserts  Some toffees. Chocolate-coated nuts (may be rolled in wheat flour) and some commercial candies and candy bars. Most cakes, cookies, donuts, pastries, and other baked goods. Some commercial ice cream. Ice cream cones. Commercially prepared mixes for cakes, cookies, and other desserts. Bread pudding and other puddings thickened with flour. Products containing brown rice syrup made with barley malt enzyme. Desserts and sweets made with malt flavoring. Seasoning and other foods  Some curry powders, some dry seasoning mixes, some gravy extracts, some meat sauces, some ketchups, some prepared mustards, and horseradish. Certain soy sauces. Malt vinegar. Bouillon and bouillon cubes that contain HVP. Some chip dips, and some chewing gum. Yeast extract. Brewer's yeast. Caramel color. Some medicines and supplements. Some lip glosses and other cosmetics. The items listed may not be a complete list. Talk with your dietitian about what dietary choices are best for you. Summary  Gluten is a protein that is found in wheat, rye, barley, and some other grains. The gluten-free diet includes all foods that do not contain gluten.  If you need help finding gluten-free foods or if   you have questions, talk with your diet and nutrition specialist (registered  dietitian) or your health care provider.  Read all food labels. Gluten is often added to foods. Always check the ingredient list and look for warnings, such as "may contain gluten." This information is not intended to replace advice given to you by your health care provider. Make sure you discuss any questions you have with your health care provider. Document Released: 12/01/2005 Document Revised: 09/15/2016 Document Reviewed: 09/15/2016 Elsevier Interactive Patient Education  2018 [employer]   

## 2018-10-06 NOTE — Progress Notes (Signed)
Subjective:  [name], female    DOB: 23-Nov-1934  Age: 83 y.o. MRN: [id number]  CC: No chief complaint on file.   HPI Sophia Santos presents for HTN, HAs, anxiety, anemia f/u. Needs a new GYN  Outpatient Medications Prior to Visit  Medication Sig Dispense Refill  . ALPRAZolam (XANAX) 1 MG tablet Take 1 tablet (1 mg total) by mouth 2 (two) times daily as needed for anxiety or sleep. 60 tablet 5  . AMITIZA 24 MCG capsule TAKE ONE CAPSULE BY MOUTH TWICE A DAY AS NEEDED FOR CONSTIPATION 60 capsule 2  . amoxicillin (AMOXIL) 500 MG capsule Take 1 capsule (500 mg total) by mouth 3 (three) times daily. 30 capsule 0  . benzonatate (TESSALON) 100 MG capsule Take 1 capsule (100 mg total) by mouth 2 (two) times daily as needed for cough. 20 capsule 0  . butalbital-acetaminophen-caffeine (FIORICET) 50-325-40 MG tablet Take 1-2 tablets by mouth 2 (two) times daily as needed for headache. 90 tablet 1  . captopril (CAPOTEN) 100 MG tablet TAKE 1 TABLET (100 MG TOTAL) BY MOUTH 2 (TWO) TIMES DAILY. 180 tablet 2  . Cholecalciferol 1000 UNITS tablet Take 1,000 Units by mouth daily.      Marland Kitchen conjugated estrogens (PREMARIN) vaginal cream Use pv qod x 1 week, then pv q1wk 42.5 g 12  . dicyclomine (BENTYL) 10 MG capsule Take 1 capsule (10 mg total) by mouth 3 (three) times daily as needed for spasms. 120 capsule 5  . dipyridamole-aspirin (AGGRENOX) 200-25 MG 12hr capsule Take 1 capsule by mouth 2 (two) times daily. 180 capsule 3  . ezetimibe (ZETIA) 10 MG tablet Take 1 tablet (10 mg total) by mouth daily. 90 tablet 3  . hydrocortisone (ANUSOL-HC) 25 MG suppository Place 1 suppository (25 mg total) rectally at bedtime. 10 suppository 1  . meloxicam (MOBIC) 7.5 MG tablet Take 1 tablet (7.5 mg total) by mouth daily. 30 tablet 3  . MYRBETRIQ 25 MG TB24 tablet Take 1 tablet (25 mg total) by mouth daily as needed. 30 tablet 11  . omeprazole (PRILOSEC) 20 MG capsule Take 2 capsules (40 mg total) by mouth  daily. 180 capsule 3  . Pancrelipase, Lip-Prot-Amyl, (CREON) 24000-76000 units CPEP TAKE ONE CAPSULE BY MOUTH 3 TIMES A DAY WITH FOOD 270 capsule 3  . polyethylene glycol powder (GLYCOLAX/MIRALAX) powder MIX 17 GM AS DIRECTED AND DRINK TWICE DAILY AS NEEDED FOR MODERATE CONSTIPATION 527 g 7  . propranolol ER (INDERAL LA) 120 MG 24 hr capsule Take 1 capsule (120 mg total) by mouth daily. 90 capsule 2  . RESTASIS 0.05 % ophthalmic emulsion     . topiramate (TOPAMAX) 50 MG tablet Take 50 mg by mouth daily.  1   Facility-Administered Medications Prior to Visit  Medication Dose Route Frequency Provider Last Rate Last Dose  . 0.9 %  sodium chloride infusion  500 mL Intravenous Continuous Milus Banister, MD        ROS: Review of Systems  Constitutional: Negative for activity change, appetite change, chills, fatigue and unexpected weight change.  HENT: Negative for congestion, mouth sores and sinus pressure.   Eyes: Negative for visual disturbance.  Respiratory: Negative for cough and chest tightness.   Cardiovascular: Negative for chest pain and leg swelling.  Gastrointestinal: Negative for abdominal pain and nausea.  Genitourinary: Negative for difficulty urinating, frequency and vaginal pain.  Musculoskeletal: Negative for back pain and gait problem.  Skin: Negative for pallor and rash.  Neurological: Negative for  dizziness, tremors, weakness, numbness and headaches.  Psychiatric/Behavioral: Positive for decreased concentration and sleep disturbance. Negative for confusion and suicidal ideas. The patient is nervous/anxious.     Objective:  BP 122/72 (BP Location: Left Arm, Patient Position: Sitting, Cuff Size: Normal)   Pulse 63   Temp 97.6 F (36.4 C) (Oral)   Ht 5\' 2"  (1.575 m)   Wt 128 lb (58.1 kg)   SpO2 98%   BMI 23.41 kg/m   BP Readings from Last 3 Encounters:  10/06/18 122/72  06/04/18 130/76  03/13/18 116/74    Wt Readings from Last 3 Encounters:  10/06/18 128 lb  (58.1 kg)  06/04/18 126 lb (57.2 kg)  03/13/18 126 lb (57.2 kg)    Physical Exam  Constitutional: She appears well-developed. No distress.  HENT:  Head: Normocephalic.  Right Ear: External ear normal.  Left Ear: External ear normal.  Nose: Nose normal.  Mouth/Throat: Oropharynx is clear and moist.  Eyes: Pupils are equal, round, and reactive to light. Conjunctivae are normal. Right eye exhibits no discharge. Left eye exhibits no discharge.  Neck: Normal range of motion. Neck supple. No JVD present. No tracheal deviation present. No thyromegaly present.  Cardiovascular: Normal rate, regular rhythm and normal heart sounds.  Pulmonary/Chest: No stridor. No respiratory distress. She has no wheezes.  Abdominal: Soft. Bowel sounds are normal. She exhibits no distension and no mass. There is no tenderness. There is no rebound and no guarding.  Musculoskeletal: She exhibits no edema or tenderness.  Lymphadenopathy:    She has no cervical adenopathy.  Neurological: She displays normal reflexes. No cranial nerve deficit. She exhibits normal muscle tone. Coordination normal.  Skin: No rash noted. No erythema.  Psychiatric: She has a normal mood and affect. Her behavior is normal. Judgment and thought content normal.    Lab Results  Component Value Date   WBC 4.9 06/04/2018   HGB 10.9 (L) 06/04/2018   HCT 31.9 (L) 06/04/2018   PLT 233.0 06/04/2018   GLUCOSE 102 (H) 06/04/2018   CHOL 223 (H) 03/02/2018   TRIG 258.0 (H) 03/02/2018   HDL 59.90 03/02/2018   LDLDIRECT 143.0 03/02/2018   LDLCALC 128 (H) 11/26/2017   ALT 9 03/02/2018   AST 12 03/02/2018   NA 133 (L) 06/04/2018   K 3.7 06/04/2018   CL 101 06/04/2018   CREATININE 0.83 06/04/2018   BUN 19 06/04/2018   CO2 26 06/04/2018   TSH 1.17 11/26/2017   HGBA1C 5.9 09/29/2007    Dg Abd 1 View  Result Date: 12/02/2017 CLINICAL DATA:  Chronic lower abdominal pain and constipation. EXAM: ABDOMEN - 1 VIEW COMPARISON:  09/03/2016  FINDINGS: The bowel gas pattern is normal. No excessive stool in the colon. No fecal impaction. Stable calcified 3.7 cm fibroid in the uterus. Chronic lumbar scoliosis. Aortic atherosclerosis. IMPRESSION: No acute abnormality of the abdomen. Specifically, no evidence of excessive stool or fecal impaction. Electronically Signed   By: Lorriane Shire M.D.   On: 12/02/2017 16:49    Assessment & Plan:   There are no diagnoses linked to this encounter.   No orders of the defined types were placed in this encounter.    Follow-up: No follow-ups on file.  Walker Kehr, MD

## 2018-10-06 NOTE — Assessment & Plan Note (Signed)
Minor life-long normocytic ?etiol Hem ref offered - pt declined

## 2018-10-06 NOTE — Assessment & Plan Note (Signed)
Captopril Risks associated with treatment noncompliance were discussed. Compliance was encouraged. 

## 2018-10-06 NOTE — Assessment & Plan Note (Signed)
F/u w/surgery if needed

## 2018-10-19 ENCOUNTER — Other Ambulatory Visit (INDEPENDENT_AMBULATORY_CARE_PROVIDER_SITE_OTHER): Payer: Medicare Other

## 2018-10-19 DIAGNOSIS — R1010 Upper abdominal pain, unspecified: Secondary | ICD-10-CM | POA: Diagnosis not present

## 2018-10-19 DIAGNOSIS — E785 Hyperlipidemia, unspecified: Secondary | ICD-10-CM

## 2018-10-19 LAB — HEPATIC FUNCTION PANEL
ALBUMIN: 3.9 g/dL (ref 3.5–5.2)
ALK PHOS: 84 U/L (ref 39–117)
ALT: 18 U/L (ref 0–35)
AST: 16 U/L (ref 0–37)
Bilirubin, Direct: 0.1 mg/dL (ref 0.0–0.3)
TOTAL PROTEIN: 6.7 g/dL (ref 6.0–8.3)
Total Bilirubin: 0.3 mg/dL (ref 0.2–1.2)

## 2018-10-19 LAB — LIPID PANEL
Cholesterol: 225 mg/dL — ABNORMAL HIGH (ref 0–200)
HDL: 61.6 mg/dL (ref >39.00)
LDL Cholesterol: 145 mg/dL — ABNORMAL HIGH (ref 0–99)
NONHDL: 163.4
Total CHOL/HDL Ratio: 4
Triglycerides: 93 mg/dL (ref 0.0–149.0)
VLDL: 18.6 mg/dL (ref 0.0–40.0)

## 2018-10-19 LAB — CBC WITH DIFFERENTIAL/PLATELET
BASOS ABS: 0 10*3/uL (ref 0.0–0.1)
Basophils Relative: 0.8 % (ref 0.0–3.0)
Eosinophils Absolute: 0.1 10*3/uL (ref 0.0–0.7)
Eosinophils Relative: 1.6 % (ref 0.0–5.0)
HEMATOCRIT: 33.6 % — AB (ref 36.0–46.0)
Hemoglobin: 11.4 g/dL — ABNORMAL LOW (ref 12.0–15.0)
LYMPHS PCT: 50.5 % — AB (ref 12.0–46.0)
Lymphs Abs: 2.1 10*3/uL (ref 0.7–4.0)
MCHC: 34 g/dL (ref 30.0–36.0)
MCV: 95.2 fl (ref 78.0–100.0)
Monocytes Absolute: 0.4 10*3/uL (ref 0.1–1.0)
Monocytes Relative: 8.8 % (ref 3.0–12.0)
NEUTROS PCT: 38.3 % — AB (ref 43.0–77.0)
Neutro Abs: 1.6 10*3/uL (ref 1.4–7.7)
Platelets: 241 10*3/uL (ref 150.0–400.0)
RBC: 3.53 Mil/uL — AB (ref 3.87–5.11)
RDW: 13.6 % (ref 11.5–15.5)
WBC: 4.2 10*3/uL (ref 4.0–10.5)

## 2018-10-19 LAB — BASIC METABOLIC PANEL
BUN: 17 mg/dL (ref 6–23)
CO2: 28 meq/L (ref 19–32)
Calcium: 8.9 mg/dL (ref 8.4–10.5)
Chloride: 104 mEq/L (ref 96–112)
Creatinine, Ser: 0.74 mg/dL (ref 0.40–1.20)
GFR: 79.74 mL/min (ref >60.00)
GLUCOSE: 96 mg/dL (ref 70–99)
POTASSIUM: 3.7 meq/L (ref 3.5–5.1)
SODIUM: 138 meq/L (ref 135–145)

## 2018-10-19 LAB — TSH: TSH: 1.99 u[IU]/mL (ref 0.35–4.50)

## 2018-11-04 DIAGNOSIS — Z1231 Encounter for screening mammogram for malignant neoplasm of breast: Secondary | ICD-10-CM | POA: Diagnosis not present

## 2018-11-04 LAB — HM MAMMOGRAPHY

## 2018-11-10 ENCOUNTER — Encounter: Payer: Self-pay | Admitting: Internal Medicine

## 2018-11-15 ENCOUNTER — Encounter: Payer: Self-pay | Admitting: Obstetrics & Gynecology

## 2018-11-15 ENCOUNTER — Ambulatory Visit (INDEPENDENT_AMBULATORY_CARE_PROVIDER_SITE_OTHER): Payer: Medicare Other | Admitting: Obstetrics & Gynecology

## 2018-11-15 VITALS — BP 173/91 | HR 60 | Ht 60.0 in | Wt 129.7 lb

## 2018-11-15 DIAGNOSIS — N814 Uterovaginal prolapse, unspecified: Secondary | ICD-10-CM

## 2018-11-15 NOTE — Progress Notes (Signed)
[name], female   DOB: 18-Mar-1934, 84 y.o.   MRN: [id number]  Chief Complaint  Patient presents with  . Gynecologic Exam    HPI Sophia Santos is a 84 y.o. female.  H/o uterine prolapse previously followed by Dr Benjie Karvonen. She cannot continue at that office and is referred by Dr Alain Marion. She has done well since her pessary was discontinued in February with no vaginal sx except occasional vulvar irritation that resolve when she applies premarin cream on rare occasions HPI  Past Medical History:  Diagnosis Date  . Arthritis   . Blood in stool    with rectal pain   . Cataract    bilateral eyes removed  . Colon polyp   . Diverticulosis   . GERD (gastroesophageal reflux disease)   . HTN (hypertension)   . Hyperlipidemia   . Irritable bowel syndrome (IBS)   . Osteoarthritis   . Osteopenia   . Osteoporosis   . Uterine prolapse     Past Surgical History:  Procedure Laterality Date  . bilerateral carteracts removed    . COLONOSCOPY    . POLYPECTOMY    . uterine prolapse surgery      Family History  Problem Relation Age of Onset  . Colon cancer Mother 46  . Stroke Mother   . Parkinson's disease Father   . Dementia Father   . Coronary artery disease Other        female 1st degree <50  . Esophageal cancer Neg Hx   . Rectal cancer Neg Hx   . Stomach cancer Neg Hx   . Colon polyps Neg Hx     Social History Social History   Tobacco Use  . Smoking status: Never Smoker  . Smokeless tobacco: Never Used  Substance Use Topics  . Alcohol use: No  . Drug use: No    Allergies  Allergen Reactions  . Alendronate Sodium     REACTION: pains  . Fosamax [Alendronate Sodium]   . Lipitor [Atorvastatin]     arthralgias  . Simvastatin     REACTION: cramp  . Voltaren [Diclofenac Sodium]     Skin rash from gel    Current Outpatient Medications  Medication Sig Dispense Refill  . ALPRAZolam (XANAX) 1 MG tablet Take 1 tablet (1 mg total) by mouth 2 (two) times daily as  needed for anxiety or sleep. 60 tablet 5  . AMITIZA 24 MCG capsule TAKE ONE CAPSULE BY MOUTH TWICE A DAY AS NEEDED FOR CONSTIPATION 60 capsule 2  . benzonatate (TESSALON) 100 MG capsule Take 1 capsule (100 mg total) by mouth 2 (two) times daily as needed for cough. 20 capsule 0  . butalbital-acetaminophen-caffeine (FIORICET) 50-325-40 MG tablet Take 1-2 tablets by mouth 2 (two) times daily as needed for headache. 90 tablet 1  . captopril (CAPOTEN) 50 MG tablet Take 1 tablet (50 mg total) by mouth 2 (two) times daily. 180 tablet 3  . Cholecalciferol 1000 UNITS tablet Take 1,000 Units by mouth daily.      Marland Kitchen conjugated estrogens (PREMARIN) vaginal cream Use pv qod x 1 week, then pv q1wk 42.5 g 12  . dicyclomine (BENTYL) 10 MG capsule Take 1 capsule (10 mg total) by mouth 3 (three) times daily as needed for spasms. 120 capsule 5  . dipyridamole-aspirin (AGGRENOX) 200-25 MG 12hr capsule Take 1 capsule by mouth 2 (two) times daily. 180 capsule 3  . ezetimibe (ZETIA) 10 MG tablet Take 1 tablet (10 mg total) by mouth  daily. 90 tablet 3  . meloxicam (MOBIC) 7.5 MG tablet Take 1 tablet (7.5 mg total) by mouth daily. 30 tablet 3  . MYRBETRIQ 25 MG TB24 tablet Take 1 tablet (25 mg total) by mouth daily as needed. 30 tablet 11  . omeprazole (PRILOSEC) 20 MG capsule Take 2 capsules (40 mg total) by mouth daily. 180 capsule 3  . Pancrelipase, Lip-Prot-Amyl, (CREON) 24000-76000 units CPEP TAKE ONE CAPSULE BY MOUTH 3 TIMES A DAY WITH FOOD 270 capsule 3  . propranolol ER (INDERAL LA) 120 MG 24 hr capsule Take 1 capsule (120 mg total) by mouth daily. 90 capsule 3  . RESTASIS 0.05 % ophthalmic emulsion     . topiramate (TOPAMAX) 50 MG tablet Take 50 mg by mouth daily.  1  . amoxicillin (AMOXIL) 500 MG capsule Take 1 capsule (500 mg total) by mouth 3 (three) times daily. (Patient not taking: Reported on 11/15/2018) 30 capsule 0  . hydrocortisone (ANUSOL-HC) 25 MG suppository Place 1 suppository (25 mg total) rectally at  bedtime. (Patient not taking: Reported on 11/15/2018) 10 suppository 1  . polyethylene glycol powder (GLYCOLAX/MIRALAX) powder MIX 17 GM AS DIRECTED AND DRINK TWICE DAILY AS NEEDED FOR MODERATE CONSTIPATION (Patient not taking: Reported on 11/15/2018) 527 g 5  . pravastatin (PRAVACHOL) 20 MG tablet Take 1 tablet (20 mg total) by mouth daily. (Patient not taking: Reported on 11/15/2018) 30 tablet 11   Current Facility-Administered Medications  Medication Dose Route Frequency Provider Last Rate Last Dose  . 0.9 %  sodium chloride infusion  500 mL Intravenous Continuous Milus Banister, MD        Review of Systems Review of Systems  Constitutional: Negative.   Respiratory: Negative.   Cardiovascular: Negative.   Gastrointestinal: Negative.   Genitourinary: Negative for frequency, menstrual problem, pelvic pain, vaginal bleeding and vaginal pain.    Blood pressure (!) 173/91, pulse 60, height 5' (1.524 m), weight 129 lb 11.2 oz (58.8 kg).  Physical Exam Physical Exam  Constitutional: She appears well-developed. No distress.  Pulmonary/Chest: Effort normal. No respiratory distress.  Skin: Skin is warm and dry.  Psychiatric: She has a normal mood and affect.  Vitals reviewed.   Data Reviewed [place] 2018 small fibroid calcified  Assessment    H/o pelvic prolapse now no sx  No need for pap test after age 30  Plan    Gyn care prn Annual breast exam and mammogram as indicated Primary care f/u Interpreter present for her visit-Russian        Emeterio Reeve 11/15/2018, 11:44 AM

## 2018-11-15 NOTE — Patient Instructions (Signed)
About Pelvic Support Problems  Pelvic Support Problems Explained Ligaments, muscles, and connective tissue normally hold your bladder, uterus, and other organs in their proper places in your pelvis. When these tissues become weak, a problem with pelvic support may result. Weak support can cause one or more of the pelvic organs to drop down into the vagina. An organ may even drop so far that is partially exposed outside the body.  Pelvic support problems are named by the change in the organ. The main types of pelvic support problems are:  . Cystocele: When the bladder drops down into your vagina.  . Enterocele: When your small intestine drops between your vagina and rectum.  . Rectocele: When your rectum bulges into the vaginal wall.  . Uterine prolapse: When your uterus drops into your vagina.  . Vaginal prolapse: When the top part of the vagina begins to droop. This sometimes happens after a hysterectomy (removal of the uterus).   Causes Pelvic support problems can be caused by many conditions. They may begin after you give birth, especially if you had a large baby. During childbirth, the muscles and skin of the birth canal (vagina) are stretched and sometimes torn. They heal over time but are not always exactly the same. A long pushing stage of labor may also weaken these tissues as well as very rapid births as the tissues do not have time to stretch so they tear.  Also, after menopause, there are changes in the vaginal walls resulting from a decrease in estrogen. Estrogen helps to keep the tissues toned. Low levels of estrogen weaken the vaginal walls and may cause the bladder to shift from its normal position. As women get older, the loss of muscle tone and the relaxation of muscles may cause the uterus or other organs to drop.  Over time, conditions like chronic coughing, chronic constipation, doing a lot of heavy lifting, straining to pass stool, and obesity, can also weaken the pelvic support  muscles.   Diagnosing Pelvic Support Problems Your health care provider will ask about your symptoms and do a pelvic examination. Your provider may also do a rectal exam during your pelvic exam. Your provider may ask you to: 1. Bear down and push (like you are having a bowel movement) so he or she can see if your bladder or other part of your body protrudes into the vagina. 2. Contract the muscles of your pelvis to check the strength of your pelvic muscles.  3. Do several types of urine, nerve and muscle tests of the pelvis and around the bladder to see what type of treatment is best for you.   Symptoms Symptoms of pelvic support problems depend on the organ involved, but may include:  . urine leakage  . stain or fecal loss after a bowel movement . trouble having bowel movements  . ache in the lower abdomen, groin, or lower back  . bladder infection  . a feeling of heaviness, pulling, or fullness in the pelvis, or a feeling that something is falling out of the vagina  . an organ protruding from your vaginal opening  . feeling the need to support the organs or perineal area to empty bladder or bowels . painful sexual intercourse.  Many women feel pelvic pressure or trouble holding their urine immediately after childbirth. For some, these symptoms go away permanently, in others they return as they get older.  Treatment Options A prolapsed organ cannot repair itself. Contact your health care provider as soon as   you notice symptoms of a problem. Treatment depends on what the specific problem is and how far advanced it is.  . The symptoms caused by some pelvic support problems may simply be treated with changes in diet, medicine to soften the stool, weight loss, or avoiding strenuous activities. You may also do pelvic floor exercises to help strengthen your pelvic muscles.  . Some cases of prolapse may require a special support device made from plastic or rubber called a pessary that fits into the  vagina to support the uterus, vagina, or bladder. A pessary can also help women who leak urine when coughing, straining, or exercising. In mild cases, a tampon or vaginal diaphragm may be used instead of a pessary.  Talk to your doctor or health care provider about these options. . In serious cases, surgery may be needed to put the organs back into their proper place. The uterus may be removed because of the pressure it puts on the bladder.  Your doctor will know what surgery will be best for you. How can I prevent pelvic support problems?  You can help prevent pelvic support problems by:  . maintaining a healthy lifestyle  . continuing to do pelvic floor exercises after you deliver a baby  . maintaining a healthy weight  . avoiding a lot of heavy lifting and lifting with your legs (not from your waist)  . treating constipation and avoid getting constipated by eating high fiber foods.    2007, [employer] Doc.32  

## 2018-12-07 ENCOUNTER — Other Ambulatory Visit: Payer: Self-pay | Admitting: Internal Medicine

## 2018-12-08 MED ORDER — ALPRAZOLAM 1 MG PO TABS: 1.0000 mg | ORAL_TABLET | Freq: Two times a day (BID) | ORAL | 5 refills | Status: DC | PRN

## 2018-12-27 ENCOUNTER — Other Ambulatory Visit: Payer: Self-pay | Admitting: Dermatology

## 2018-12-27 DIAGNOSIS — L820 Inflamed seborrheic keratosis: Secondary | ICD-10-CM | POA: Diagnosis not present

## 2018-12-27 DIAGNOSIS — D0439 Carcinoma in situ of skin of other parts of face: Secondary | ICD-10-CM | POA: Diagnosis not present

## 2018-12-27 DIAGNOSIS — D485 Neoplasm of uncertain behavior of skin: Secondary | ICD-10-CM | POA: Diagnosis not present

## 2018-12-31 ENCOUNTER — Other Ambulatory Visit: Payer: Self-pay | Admitting: Internal Medicine

## 2019-01-27 ENCOUNTER — Other Ambulatory Visit: Payer: Self-pay | Admitting: Internal Medicine

## 2019-02-16 ENCOUNTER — Other Ambulatory Visit (INDEPENDENT_AMBULATORY_CARE_PROVIDER_SITE_OTHER): Payer: Medicare Other

## 2019-02-16 ENCOUNTER — Encounter: Payer: Self-pay | Admitting: Internal Medicine

## 2019-02-16 ENCOUNTER — Ambulatory Visit (INDEPENDENT_AMBULATORY_CARE_PROVIDER_SITE_OTHER): Payer: Medicare Other | Admitting: Internal Medicine

## 2019-02-16 VITALS — BP 134/82 | HR 69 | Temp 97.8°F | Ht 60.0 in | Wt 129.0 lb

## 2019-02-16 DIAGNOSIS — E785 Hyperlipidemia, unspecified: Secondary | ICD-10-CM

## 2019-02-16 DIAGNOSIS — I10 Essential (primary) hypertension: Secondary | ICD-10-CM

## 2019-02-16 DIAGNOSIS — K581 Irritable bowel syndrome with constipation: Secondary | ICD-10-CM | POA: Diagnosis not present

## 2019-02-16 DIAGNOSIS — G4700 Insomnia, unspecified: Secondary | ICD-10-CM | POA: Diagnosis not present

## 2019-02-16 DIAGNOSIS — M5442 Lumbago with sciatica, left side: Secondary | ICD-10-CM

## 2019-02-16 DIAGNOSIS — G8929 Other chronic pain: Secondary | ICD-10-CM

## 2019-02-16 DIAGNOSIS — R7989 Other specified abnormal findings of blood chemistry: Secondary | ICD-10-CM

## 2019-02-16 LAB — CBC WITH DIFFERENTIAL/PLATELET
Basophils Absolute: 0 10*3/uL (ref 0.0–0.1)
Basophils Relative: 0.6 % (ref 0.0–3.0)
EOS PCT: 1.6 % (ref 0.0–5.0)
Eosinophils Absolute: 0.1 10*3/uL (ref 0.0–0.7)
HCT: 32.9 % — ABNORMAL LOW (ref 36.0–46.0)
HEMOGLOBIN: 11.1 g/dL — AB (ref 12.0–15.0)
Lymphocytes Relative: 45.5 % (ref 12.0–46.0)
Lymphs Abs: 2.3 10*3/uL (ref 0.7–4.0)
MCHC: 33.6 g/dL (ref 30.0–36.0)
MCV: 96.4 fl (ref 78.0–100.0)
MONOS PCT: 8.8 % (ref 3.0–12.0)
Monocytes Absolute: 0.5 10*3/uL (ref 0.1–1.0)
Neutro Abs: 2.2 10*3/uL (ref 1.4–7.7)
Neutrophils Relative %: 43.5 % (ref 43.0–77.0)
Platelets: 218 10*3/uL (ref 150.0–400.0)
RBC: 3.42 Mil/uL — ABNORMAL LOW (ref 3.87–5.11)
RDW: 13.2 % (ref 11.5–15.5)
WBC: 5.1 10*3/uL (ref 4.0–10.5)

## 2019-02-16 LAB — LIPID PANEL
Cholesterol: 211 mg/dL — ABNORMAL HIGH (ref 0–200)
HDL: 58.6 mg/dL (ref >39.00)
LDL Cholesterol: 119 mg/dL — ABNORMAL HIGH (ref 0–99)
NonHDL: 152.47
TRIGLYCERIDES: 169 mg/dL — AB (ref 0.0–149.0)
Total CHOL/HDL Ratio: 4
VLDL: 33.8 mg/dL (ref 0.0–40.0)

## 2019-02-16 LAB — BASIC METABOLIC PANEL
BUN: 20 mg/dL (ref 6–23)
CO2: 26 mEq/L (ref 19–32)
Calcium: 8.8 mg/dL (ref 8.4–10.5)
Chloride: 102 mEq/L (ref 96–112)
Creatinine, Ser: 0.72 mg/dL (ref 0.40–1.20)
GFR: 77.37 mL/min (ref >60.00)
Glucose, Bld: 116 mg/dL — ABNORMAL HIGH (ref 70–99)
Potassium: 3.5 mEq/L (ref 3.5–5.1)
SODIUM: 133 meq/L — AB (ref 135–145)

## 2019-02-16 LAB — IRON,TIBC AND FERRITIN PANEL
%SAT: 28 % (calc) (ref 16–45)
FERRITIN: 61 ng/mL (ref 16–288)
Iron: 67 ug/dL (ref 45–160)
TIBC: 240 mcg/dL (calc) — ABNORMAL LOW (ref 250–450)

## 2019-02-16 LAB — HEPATIC FUNCTION PANEL
ALBUMIN: 4 g/dL (ref 3.5–5.2)
ALT: 23 U/L (ref 0–35)
AST: 18 U/L (ref 0–37)
Alkaline Phosphatase: 104 U/L (ref 39–117)
Bilirubin, Direct: 0 mg/dL (ref 0.0–0.3)
Total Bilirubin: 0.3 mg/dL (ref 0.2–1.2)
Total Protein: 6.7 g/dL (ref 6.0–8.3)

## 2019-02-16 MED ORDER — MYRBETRIQ 25 MG PO TB24: 25.0000 mg | ORAL_TABLET | Freq: Every day | ORAL | 3 refills | Status: DC | PRN

## 2019-02-16 MED ORDER — ASPIRIN-DIPYRIDAMOLE ER 25-200 MG PO CP12: 1.0000 | ORAL_CAPSULE | Freq: Two times a day (BID) | ORAL | 3 refills | Status: DC

## 2019-02-16 MED ORDER — LORAZEPAM 1 MG PO TABS: 1.0000 mg | ORAL_TABLET | Freq: Every evening | ORAL | 1 refills | Status: DC | PRN

## 2019-02-16 MED ORDER — FERROUS GLUCONATE 240 (27 FE) MG PO TABS: 240.0000 mg | ORAL_TABLET | Freq: Every day | ORAL | 1 refills | Status: DC

## 2019-02-16 NOTE — Assessment & Plan Note (Signed)
A cardiac CT scan for calcium scoring offered  Zetia

## 2019-02-16 NOTE — Assessment & Plan Note (Signed)
A cardiac CT scan for calcium scoring offered BP Readings from Last 3 Encounters:  02/16/19 134/82  11/15/18 (!) 173/91  10/06/18 122/72

## 2019-02-16 NOTE — Progress Notes (Signed)
Subjective:  [name], female    DOB: 04-27-1934  Age: 85 y.o. MRN: [id number]  CC: No chief complaint on file.   HPI Sophia Santos presents for IBS - better w/o gluten, dyslipidemia, HAs.   Outpatient Medications Prior to Visit  Medication Sig Dispense Refill  . ALPRAZolam (XANAX) 1 MG tablet Take 1 tablet (1 mg total) by mouth 2 (two) times daily as needed for anxiety or sleep. 60 tablet 5  . AMITIZA 24 MCG capsule TAKE ONE CAPSULE BY MOUTH TWICE A DAY AS NEEDED FOR CONSTIPATION 60 capsule 2  . butalbital-acetaminophen-caffeine (FIORICET) 50-325-40 MG tablet Take 1-2 tablets by mouth 2 (two) times daily as needed for headache. 90 tablet 1  . captopril (CAPOTEN) 50 MG tablet Take 1 tablet (50 mg total) by mouth 2 (two) times daily. 180 tablet 3  . Cholecalciferol 1000 UNITS tablet Take 1,000 Units by mouth daily.      Marland Kitchen conjugated estrogens (PREMARIN) vaginal cream Use pv qod x 1 week, then pv q1wk 42.5 g 12  . dicyclomine (BENTYL) 10 MG capsule Take 1 capsule (10 mg total) by mouth 3 (three) times daily as needed for spasms. 120 capsule 5  . dipyridamole-aspirin (AGGRENOX) 200-25 MG 12hr capsule TAKE 1 CAPSULE BY MOUTH TWICE A DAY 60 capsule 0  . ezetimibe (ZETIA) 10 MG tablet Take 1 tablet (10 mg total) by mouth daily. 90 tablet 3  . meloxicam (MOBIC) 7.5 MG tablet Take 1 tablet (7.5 mg total) by mouth daily. 30 tablet 3  . MYRBETRIQ 25 MG TB24 tablet Take 1 tablet (25 mg total) by mouth daily as needed. 30 tablet 11  . omeprazole (PRILOSEC) 20 MG capsule Take 2 capsules (40 mg total) by mouth daily. 180 capsule 3  . Pancrelipase, Lip-Prot-Amyl, (CREON) 24000-76000 units CPEP TAKE ONE CAPSULE BY MOUTH 3 TIMES A DAY WITH FOOD 270 capsule 3  . polyethylene glycol powder (GLYCOLAX/MIRALAX) powder MIX 17 GM AS DIRECTED AND DRINK TWICE DAILY AS NEEDED FOR MODERATE CONSTIPATION 527 g 5  . pravastatin (PRAVACHOL) 20 MG tablet Take 1 tablet (20 mg total) by mouth daily. 30 tablet  11  . propranolol ER (INDERAL LA) 120 MG 24 hr capsule Take 1 capsule (120 mg total) by mouth daily. 90 capsule 3  . RESTASIS 0.05 % ophthalmic emulsion     . topiramate (TOPAMAX) 50 MG tablet Take 50 mg by mouth daily.  1  . amoxicillin (AMOXIL) 500 MG capsule Take 1 capsule (500 mg total) by mouth 3 (three) times daily. (Patient not taking: Reported on 11/15/2018) 30 capsule 0  . benzonatate (TESSALON) 100 MG capsule Take 1 capsule (100 mg total) by mouth 2 (two) times daily as needed for cough. 20 capsule 0   Facility-Administered Medications Prior to Visit  Medication Dose Route Frequency Provider Last Rate Last Dose  . 0.9 %  sodium chloride infusion  500 mL Intravenous Continuous Milus Banister, MD        ROS: Review of Systems  Constitutional: Negative for activity change, appetite change, chills, fatigue and unexpected weight change.  HENT: Negative for congestion, mouth sores and sinus pressure.   Eyes: Negative for visual disturbance.  Respiratory: Negative for cough and chest tightness.   Gastrointestinal: Negative for abdominal pain and nausea.  Genitourinary: Positive for frequency. Negative for difficulty urinating and vaginal pain.  Musculoskeletal: Negative for back pain and gait problem.  Skin: Negative for pallor and rash.  Neurological: Positive for headaches. Negative for  dizziness, tremors, weakness and numbness.  Psychiatric/Behavioral: Negative for confusion, sleep disturbance and suicidal ideas.    Objective:  BP 134/82 (BP Location: Left Arm, Patient Position: Sitting, Cuff Size: Normal)   Pulse 69   Temp 97.8 F (36.6 C) (Oral)   Ht 5' (1.524 m)   Wt 129 lb (58.5 kg)   SpO2 96%   BMI 25.19 kg/m   BP Readings from Last 3 Encounters:  02/16/19 134/82  11/15/18 (!) 173/91  10/06/18 122/72    Wt Readings from Last 3 Encounters:  02/16/19 129 lb (58.5 kg)  11/15/18 129 lb 11.2 oz (58.8 kg)  10/06/18 128 lb (58.1 kg)    Physical  Exam Constitutional:      General: She is not in acute distress.    Appearance: She is well-developed.  HENT:     Head: Normocephalic.     Right Ear: External ear normal.     Left Ear: External ear normal.     Nose: Nose normal.  Eyes:     General:        Right eye: No discharge.        Left eye: No discharge.     Conjunctiva/sclera: Conjunctivae normal.     Pupils: Pupils are equal, round, and reactive to light.  Neck:     Musculoskeletal: Normal range of motion and neck supple.     Thyroid: No thyromegaly.     Vascular: No JVD.     Trachea: No tracheal deviation.  Cardiovascular:     Rate and Rhythm: Normal rate and regular rhythm.     Heart sounds: Normal heart sounds.  Pulmonary:     Effort: No respiratory distress.     Breath sounds: No stridor. No wheezing.  Abdominal:     General: Bowel sounds are normal. There is no distension.     Palpations: Abdomen is soft. There is no mass.     Tenderness: There is no abdominal tenderness. There is no guarding or rebound.  Musculoskeletal:        General: No tenderness.  Lymphadenopathy:     Cervical: No cervical adenopathy.  Skin:    Findings: No erythema or rash.  Neurological:     Cranial Nerves: No cranial nerve deficit.     Motor: No abnormal muscle tone.     Coordination: Coordination normal.     Deep Tendon Reflexes: Reflexes normal.  Psychiatric:        Behavior: Behavior normal.        Thought Content: Thought content normal.        Judgment: Judgment normal.     Lab Results  Component Value Date   WBC 4.2 10/19/2018   HGB 11.4 (L) 10/19/2018   HCT 33.6 (L) 10/19/2018   PLT 241.0 10/19/2018   GLUCOSE 96 10/19/2018   CHOL 225 (H) 10/19/2018   TRIG 93.0 10/19/2018   HDL 61.60 10/19/2018   LDLDIRECT 143.0 03/02/2018   LDLCALC 145 (H) 10/19/2018   ALT 18 10/19/2018   AST 16 10/19/2018   NA 138 10/19/2018   K 3.7 10/19/2018   CL 104 10/19/2018   CREATININE 0.74 10/19/2018   BUN 17 10/19/2018   CO2 28  10/19/2018   TSH 1.99 10/19/2018   HGBA1C 5.9 09/29/2007    Dg Abd 1 View  Result Date: 12/02/2017 CLINICAL DATA:  Chronic lower abdominal pain and constipation. EXAM: ABDOMEN - 1 VIEW COMPARISON:  09/03/2016 FINDINGS: The bowel gas pattern is normal. No excessive stool in  the colon. No fecal impaction. Stable calcified 3.7 cm fibroid in the uterus. Chronic lumbar scoliosis. Aortic atherosclerosis. IMPRESSION: No acute abnormality of the abdomen. Specifically, no evidence of excessive stool or fecal impaction. Electronically Signed   By: Lorriane Shire M.D.   On: 12/02/2017 16:49    Assessment & Plan:   There are no diagnoses linked to this encounter.   No orders of the defined types were placed in this encounter.    Follow-up: No follow-ups on file.  Walker Kehr, MD

## 2019-02-16 NOTE — Assessment & Plan Note (Signed)
CBC, iron 

## 2019-02-16 NOTE — Patient Instructions (Signed)

## 2019-02-16 NOTE — Assessment & Plan Note (Signed)
Lorazepam prn restart; D/c Alprazolam  Potential benefits of a long term benzodiazepines  use as well as potential risks  and complications were explained to the patient and were aknowledged.

## 2019-02-16 NOTE — Assessment & Plan Note (Signed)
Tylenol prn 

## 2019-03-02 ENCOUNTER — Other Ambulatory Visit: Payer: Self-pay | Admitting: Internal Medicine

## 2019-04-04 ENCOUNTER — Telehealth: Payer: Self-pay | Admitting: Family Medicine

## 2019-04-04 NOTE — Telephone Encounter (Signed)
The patient stated she is experiencing bleeding which started yesterday. She also stated she is experiencing pain in her pelvic area. She thinks she may have a bladder infection. Would like to speak with a nurse.

## 2019-04-04 NOTE — Telephone Encounter (Signed)
Communicated with the patient via interrupter id [id number].

## 2019-04-05 ENCOUNTER — Telehealth: Payer: Self-pay | Admitting: Obstetrics and Gynecology

## 2019-04-05 NOTE — Telephone Encounter (Signed)
The patient called in regarding the call back from a nurse. Informed the patient of the 24-48 hour grace period from the nurse line to receive a call back. Sent another message to the nurse to ensure they received it. The patient verbalized understanding and has no further questions at this time.   *Interrupter id [id number]

## 2019-04-06 ENCOUNTER — Telehealth: Payer: Self-pay | Admitting: Obstetrics and Gynecology

## 2019-04-06 NOTE — Telephone Encounter (Signed)
Returned call to pt w/[employer] interpreter Gae Bon [phone]. She states that she began having pelvic pain 2 weeks ago with some possible vaginal bleeding. Then 3-4 days ago she noticed blood in her urine and has been having painful urination. She feels she needs antibiotics for a possible bladder infection as well as an office appointment for evaluation of her vaginal pain. After contacting our office on 4/20, she called her urologist and was able to get appointment tomorrow to take care of the blood in her urine. Pt advised that she will be contacted by our scheduling staff with appointment information to be seen in our office for evaluation of vaginal pain. Pt voiced understanding that she will likely not be able to have appointment until May.

## 2019-04-06 NOTE — Telephone Encounter (Signed)
Called the patient to inform of upcoming appointment. The patient verbalized understanding. Scheduled the virtual visit for pelvic pain per Diane (RN). Informed the patient of the virtual visit. She stated she is old and has a hard time finding locations. Stated she would rather have an afternoon appointment. Informed the patient of completing the virtual visit and if Dr. Roselie Awkward would like to see her face to face we will scheduled an face to face appointment at that time. Interrupter ID [id number] assisted with explaining the information to the patient.

## 2019-04-07 DIAGNOSIS — R8279 Other abnormal findings on microbiological examination of urine: Secondary | ICD-10-CM | POA: Diagnosis not present

## 2019-04-07 DIAGNOSIS — N952 Postmenopausal atrophic vaginitis: Secondary | ICD-10-CM | POA: Diagnosis not present

## 2019-04-07 DIAGNOSIS — R300 Dysuria: Secondary | ICD-10-CM | POA: Diagnosis not present

## 2019-04-07 DIAGNOSIS — N8110 Cystocele, unspecified: Secondary | ICD-10-CM | POA: Diagnosis not present

## 2019-04-13 NOTE — Telephone Encounter (Signed)
Opened in error

## 2019-04-22 ENCOUNTER — Other Ambulatory Visit (HOSPITAL_COMMUNITY): Payer: Self-pay | Admitting: Obstetrics & Gynecology

## 2019-04-22 DIAGNOSIS — N952 Postmenopausal atrophic vaginitis: Secondary | ICD-10-CM

## 2019-04-22 MED ORDER — ESTROGENS, CONJUGATED 0.625 MG/GM VA CREA: TOPICAL_CREAM | VAGINAL | 1 refills | Status: DC

## 2019-04-25 ENCOUNTER — Telehealth: Payer: Self-pay

## 2019-04-25 NOTE — Telephone Encounter (Signed)
Called pt to advise that Dr. Roselie Awkward sent her a Rx for Premarin Vaginal Cream on 04/22/19. Pt verbalized understanding.

## 2019-04-25 NOTE — Telephone Encounter (Signed)
-----   Message from Woodroe Mode, MD sent at 04/22/2019 10:13 AM EDT ----- I sent a prescription for vaginal estrogen per her request. She has an appointment 5/27

## 2019-05-06 ENCOUNTER — Other Ambulatory Visit: Payer: Self-pay | Admitting: Internal Medicine

## 2019-05-10 ENCOUNTER — Telehealth: Payer: Self-pay

## 2019-05-10 NOTE — Telephone Encounter (Signed)
Patient called in to cancel her  Appointment coming up at 815 because she is feeling better and the doctor sent her a cream and it helped. Advised patient tat I woul dget it cancelled for her.

## 2019-05-11 ENCOUNTER — Ambulatory Visit: Payer: Medicare Other | Admitting: Obstetrics & Gynecology

## 2019-06-09 ENCOUNTER — Telehealth: Payer: Self-pay | Admitting: Family Medicine

## 2019-06-09 NOTE — Telephone Encounter (Signed)
Called the patient with the interrupter to reschedule the appointment. The patient stated she cancelled the appointment as it was to early in the morning. She would rather have a late afternoon. Informed of the rescheduled appointment however the patient stated she is feeling better and doesn't need an appointment at this time. Informed her if anything changes please give [place] call back to reschedule. Apologized for any inconvinced.  Id: [id number]

## 2019-06-09 NOTE — Telephone Encounter (Signed)
Opened in error

## 2019-06-10 ENCOUNTER — Other Ambulatory Visit: Payer: Self-pay | Admitting: Internal Medicine

## 2019-06-20 ENCOUNTER — Other Ambulatory Visit (INDEPENDENT_AMBULATORY_CARE_PROVIDER_SITE_OTHER): Payer: Medicare Other

## 2019-06-20 ENCOUNTER — Encounter: Payer: Self-pay | Admitting: Internal Medicine

## 2019-06-20 ENCOUNTER — Ambulatory Visit (INDEPENDENT_AMBULATORY_CARE_PROVIDER_SITE_OTHER): Payer: Medicare Other | Admitting: Internal Medicine

## 2019-06-20 ENCOUNTER — Other Ambulatory Visit: Payer: Self-pay

## 2019-06-20 DIAGNOSIS — I10 Essential (primary) hypertension: Secondary | ICD-10-CM

## 2019-06-20 DIAGNOSIS — F411 Generalized anxiety disorder: Secondary | ICD-10-CM | POA: Diagnosis not present

## 2019-06-20 DIAGNOSIS — E785 Hyperlipidemia, unspecified: Secondary | ICD-10-CM | POA: Diagnosis not present

## 2019-06-20 DIAGNOSIS — L570 Actinic keratosis: Secondary | ICD-10-CM

## 2019-06-20 DIAGNOSIS — G4700 Insomnia, unspecified: Secondary | ICD-10-CM | POA: Diagnosis not present

## 2019-06-20 DIAGNOSIS — G43809 Other migraine, not intractable, without status migrainosus: Secondary | ICD-10-CM | POA: Diagnosis not present

## 2019-06-20 LAB — LIPID PANEL
Cholesterol: 200 mg/dL (ref 0–200)
HDL: 53.2 mg/dL (ref >39.00)
LDL Cholesterol: 123 mg/dL — ABNORMAL HIGH (ref 0–99)
NonHDL: 146.99
Total CHOL/HDL Ratio: 4
Triglycerides: 120 mg/dL (ref 0.0–149.0)
VLDL: 24 mg/dL (ref 0.0–40.0)

## 2019-06-20 LAB — BASIC METABOLIC PANEL
BUN: 16 mg/dL (ref 6–23)
CO2: 26 mEq/L (ref 19–32)
Calcium: 9 mg/dL (ref 8.4–10.5)
Chloride: 99 mEq/L (ref 96–112)
Creatinine, Ser: 0.77 mg/dL (ref 0.40–1.20)
GFR: 71.54 mL/min (ref >60.00)
Glucose, Bld: 108 mg/dL — ABNORMAL HIGH (ref 70–99)
Potassium: 4.3 mEq/L (ref 3.5–5.1)
Sodium: 131 mEq/L — ABNORMAL LOW (ref 135–145)

## 2019-06-20 LAB — HEPATIC FUNCTION PANEL
ALT: 26 U/L (ref 0–35)
AST: 17 U/L (ref 0–37)
Albumin: 4.1 g/dL (ref 3.5–5.2)
Alkaline Phosphatase: 100 U/L (ref 39–117)
Bilirubin, Direct: 0.1 mg/dL (ref 0.0–0.3)
Total Bilirubin: 0.3 mg/dL (ref 0.2–1.2)
Total Protein: 6.8 g/dL (ref 6.0–8.3)

## 2019-06-20 MED ORDER — ALPRAZOLAM 1 MG PO TABS: 0.5000 mg | ORAL_TABLET | Freq: Every evening | ORAL | 5 refills | Status: DC | PRN

## 2019-06-20 MED ORDER — BUTALBITAL-APAP-CAFFEINE 50-325-40 MG PO TABS: 1.0000 | ORAL_TABLET | Freq: Two times a day (BID) | ORAL | 0 refills | Status: DC | PRN

## 2019-06-20 NOTE — Assessment & Plan Note (Addendum)
Migraines Fioricet rare  Potential benefits of a long term fioricet  use as well as potential risks  and complications were explained to the patient and were aknowledged. F/u w/[facility]

## 2019-06-20 NOTE — Assessment & Plan Note (Signed)
See Cryo 

## 2019-06-20 NOTE — Progress Notes (Signed)
Subjective:  [name], female    DOB: 11/02/1934  Age: 85 y.o. MRN: [id number]  CC: No chief complaint on file.   HPI Sophia Santos presents for HTN, HA, dyslipidemia f/u  Outpatient Medications Prior to Visit  Medication Sig Dispense Refill  . AMITIZA 24 MCG capsule TAKE ONE CAPSULE BY MOUTH TWICE A DAY AS NEEDED FOR CONSTIPATION 60 capsule 2  . butalbital-acetaminophen-caffeine (FIORICET) 50-325-40 MG tablet Take 1-2 tablets by mouth 2 (two) times daily as needed for headache. 90 tablet 1  . captopril (CAPOTEN) 50 MG tablet Take 1 tablet (50 mg total) by mouth 2 (two) times daily. 180 tablet 3  . Cholecalciferol 1000 UNITS tablet Take 1,000 Units by mouth daily.      Marland Kitchen conjugated estrogens (PREMARIN) vaginal cream Use pv daily x 2 week, then pv twice weekly for 8 weeks then discontinue 42.5 g 1  . dicyclomine (BENTYL) 10 MG capsule TAKE 1 CAPSULE (10 MG TOTAL) BY MOUTH 3 (THREE) TIMES DAILY AS NEEDED FOR SPASMS. 270 capsule 2  . dipyridamole-aspirin (AGGRENOX) 200-25 MG 12hr capsule Take 1 capsule by mouth 2 (two) times daily. 180 capsule 3  . ezetimibe (ZETIA) 10 MG tablet Take 1 tablet (10 mg total) by mouth daily. 90 tablet 3  . ferrous gluconate (IRON 27) 240 (27 FE) MG tablet Take 1 tablet (240 mg total) by mouth daily. 90 tablet 1  . LORazepam (ATIVAN) 1 MG tablet Take 1 tablet (1 mg total) by mouth at bedtime as needed for anxiety or sleep. 90 tablet 1  . meloxicam (MOBIC) 7.5 MG tablet Take 1 tablet (7.5 mg total) by mouth daily. 30 tablet 3  . MYRBETRIQ 25 MG TB24 tablet Take 1 tablet (25 mg total) by mouth daily as needed. 90 tablet 3  . omeprazole (PRILOSEC) 20 MG capsule Take 2 capsules (40 mg total) by mouth daily. 180 capsule 3  . Pancrelipase, Lip-Prot-Amyl, (CREON) 24000-76000 units CPEP TAKE ONE CAPSULE BY MOUTH 3 TIMES A DAY WITH FOOD 270 capsule 3  . polyethylene glycol powder (GLYCOLAX/MIRALAX) powder MIX 17 GM AS DIRECTED AND DRINK TWICE DAILY AS NEEDED  FOR MODERATE CONSTIPATION 527 g 5  . pravastatin (PRAVACHOL) 20 MG tablet Take 1 tablet (20 mg total) by mouth daily. 30 tablet 11  . propranolol ER (INDERAL LA) 120 MG 24 hr capsule Take 1 capsule (120 mg total) by mouth daily. 90 capsule 3  . RESTASIS 0.05 % ophthalmic emulsion     . topiramate (TOPAMAX) 50 MG tablet Take 50 mg by mouth daily.  1   Facility-Administered Medications Prior to Visit  Medication Dose Route Frequency Provider Last Rate Last Dose  . 0.9 %  sodium chloride infusion  500 mL Intravenous Continuous Milus Banister, MD        ROS: Review of Systems  Constitutional: Positive for fatigue. Negative for activity change, appetite change, chills and unexpected weight change.  HENT: Negative for congestion, mouth sores and sinus pressure.   Eyes: Negative for visual disturbance.  Respiratory: Negative for cough and chest tightness.   Gastrointestinal: Negative for abdominal pain and nausea.  Genitourinary: Negative for difficulty urinating, frequency and vaginal pain.  Musculoskeletal: Positive for gait problem. Negative for back pain.  Skin: Negative for pallor and rash.  Neurological: Negative for dizziness, tremors, weakness, numbness and headaches.  Psychiatric/Behavioral: Negative for confusion, sleep disturbance and suicidal ideas.    Objective:  BP 138/70 (BP Location: Left Arm, Patient Position: Sitting, Cuff Size:  Normal)   Pulse 60   Temp 98.1 F (36.7 C) (Oral)   Ht 5' (1.524 m)   Wt 123 lb (55.8 kg)   SpO2 97%   BMI 24.02 kg/m   BP Readings from Last 3 Encounters:  06/20/19 138/70  02/16/19 134/82  11/15/18 (!) 173/91    Wt Readings from Last 3 Encounters:  06/20/19 123 lb (55.8 kg)  02/16/19 129 lb (58.5 kg)  11/15/18 129 lb 11.2 oz (58.8 kg)    Physical Exam Constitutional:      General: She is not in acute distress.    Appearance: She is well-developed.  HENT:     Head: Normocephalic.     Right Ear: External ear normal.     Left  Ear: External ear normal.     Nose: Nose normal.  Eyes:     General:        Right eye: No discharge.        Left eye: No discharge.     Conjunctiva/sclera: Conjunctivae normal.     Pupils: Pupils are equal, round, and reactive to light.  Neck:     Musculoskeletal: Normal range of motion and neck supple.     Thyroid: No thyromegaly.     Vascular: No JVD.     Trachea: No tracheal deviation.  Cardiovascular:     Rate and Rhythm: Normal rate and regular rhythm.     Heart sounds: Normal heart sounds.  Pulmonary:     Effort: No respiratory distress.     Breath sounds: No stridor. No wheezing.  Abdominal:     General: Bowel sounds are normal. There is no distension.     Palpations: Abdomen is soft. There is no mass.     Tenderness: There is no abdominal tenderness. There is no guarding or rebound.  Musculoskeletal:        General: No tenderness.  Lymphadenopathy:     Cervical: No cervical adenopathy.  Skin:    Findings: No erythema or rash.  Neurological:     Cranial Nerves: No cranial nerve deficit.     Motor: No abnormal muscle tone.     Coordination: Coordination normal.     Deep Tendon Reflexes: Reflexes normal.  Psychiatric:        Behavior: Behavior normal.        Thought Content: Thought content normal.        Judgment: Judgment normal.    AKs - face   Procedure Note :     Procedure : Cryosurgery   Indication: Actinic keratosis(es)   Risks including unsuccessful procedure , bleeding, infection, bruising, scar, a need for a repeat  procedure and others were explained to the patient in detail as well as the benefits. Informed consent was obtained verbally.   4 lesion(s)  on face, nose   was/were treated with liquid nitrogen on a Q-tip in a usual fasion . Band-Aid was applied and antibiotic ointment was given for a later use.   Tolerated well. Complications none.    Lab Results  Component Value Date   WBC 5.1 02/16/2019   HGB 11.1 (L) 02/16/2019   HCT 32.9 (L)  02/16/2019   PLT 218.0 02/16/2019   GLUCOSE 116 (H) 02/16/2019   CHOL 211 (H) 02/16/2019   TRIG 169.0 (H) 02/16/2019   HDL 58.60 02/16/2019   LDLDIRECT 143.0 03/02/2018   LDLCALC 119 (H) 02/16/2019   ALT 23 02/16/2019   AST 18 02/16/2019   NA 133 (L) 02/16/2019  K 3.5 02/16/2019   CL 102 02/16/2019   CREATININE 0.72 02/16/2019   BUN 20 02/16/2019   CO2 26 02/16/2019   TSH 1.99 10/19/2018   HGBA1C 5.9 09/29/2007    Dg Abd 1 View  Result Date: 12/02/2017 CLINICAL DATA:  Chronic lower abdominal pain and constipation. EXAM: ABDOMEN - 1 VIEW COMPARISON:  09/03/2016 FINDINGS: The bowel gas pattern is normal. No excessive stool in the colon. No fecal impaction. Stable calcified 3.7 cm fibroid in the uterus. Chronic lumbar scoliosis. Aortic atherosclerosis. IMPRESSION: No acute abnormality of the abdomen. Specifically, no evidence of excessive stool or fecal impaction. Electronically Signed   By: Lorriane Shire M.D.   On: 12/02/2017 16:49    Assessment & Plan:   There are no diagnoses linked to this encounter.   No orders of the defined types were placed in this encounter.    Follow-up: No follow-ups on file.  Walker Kehr, MD

## 2019-06-20 NOTE — Assessment & Plan Note (Signed)
Chronic Restarted Alprazolam  Potential benefits of a long term benzodiazepines  use as well as potential risks  and complications were explained to the patient and were aknowledged.

## 2019-06-20 NOTE — Patient Instructions (Signed)
Keep the wounds clean. You can wash them with liquid soap and water. Pat dry with gauze or a Kleenex tissue

## 2019-06-20 NOTE — Assessment & Plan Note (Addendum)
Captopril Risks associated with treatment noncompliance were discussed. Compliance was encouraged. BMET

## 2019-06-20 NOTE — Assessment & Plan Note (Signed)
Lorazepam prn  Potential benefits of a long term benzodiazepines  use as well as potential risks  and complications were explained to the patient and were aknowledged.  

## 2019-06-21 ENCOUNTER — Encounter: Payer: Self-pay | Admitting: *Deleted

## 2019-06-27 ENCOUNTER — Ambulatory Visit: Payer: Medicare Other | Admitting: Obstetrics & Gynecology

## 2019-06-28 ENCOUNTER — Other Ambulatory Visit: Payer: Medicare Other

## 2019-07-06 DIAGNOSIS — G43719 Chronic migraine without aura, intractable, without status migrainosus: Secondary | ICD-10-CM | POA: Diagnosis not present

## 2019-07-06 DIAGNOSIS — G43019 Migraine without aura, intractable, without status migrainosus: Secondary | ICD-10-CM | POA: Diagnosis not present

## 2019-09-03 ENCOUNTER — Other Ambulatory Visit: Payer: Self-pay | Admitting: Internal Medicine

## 2019-10-25 ENCOUNTER — Other Ambulatory Visit: Payer: Self-pay

## 2019-10-25 ENCOUNTER — Encounter: Payer: Self-pay | Admitting: Internal Medicine

## 2019-10-25 ENCOUNTER — Ambulatory Visit (INDEPENDENT_AMBULATORY_CARE_PROVIDER_SITE_OTHER): Payer: Medicare Other | Admitting: Internal Medicine

## 2019-10-25 DIAGNOSIS — E785 Hyperlipidemia, unspecified: Secondary | ICD-10-CM

## 2019-10-25 DIAGNOSIS — G43809 Other migraine, not intractable, without status migrainosus: Secondary | ICD-10-CM | POA: Diagnosis not present

## 2019-10-25 DIAGNOSIS — N3281 Overactive bladder: Secondary | ICD-10-CM

## 2019-10-25 DIAGNOSIS — I10 Essential (primary) hypertension: Secondary | ICD-10-CM

## 2019-10-25 DIAGNOSIS — F411 Generalized anxiety disorder: Secondary | ICD-10-CM

## 2019-10-25 MED ORDER — CAPTOPRIL 50 MG PO TABS: 50.0000 mg | ORAL_TABLET | Freq: Two times a day (BID) | ORAL | 3 refills | Status: DC

## 2019-10-25 MED ORDER — MYRBETRIQ 25 MG PO TB24: 25.0000 mg | ORAL_TABLET | Freq: Every day | ORAL | 3 refills | Status: DC | PRN

## 2019-10-25 MED ORDER — EZETIMIBE 10 MG PO TABS: 10.0000 mg | ORAL_TABLET | Freq: Every day | ORAL | 3 refills | Status: DC

## 2019-10-25 MED ORDER — ALPRAZOLAM 1 MG PO TABS: 0.5000 mg | ORAL_TABLET | Freq: Every evening | ORAL | 1 refills | Status: DC | PRN

## 2019-10-25 MED ORDER — POLYETHYLENE GLYCOL 3350 17 GM/SCOOP PO POWD: ORAL | 5 refills | Status: DC

## 2019-10-25 NOTE — Assessment & Plan Note (Signed)
Captopril Risks associated with treatment noncompliance were discussed. Compliance was encouraged. 

## 2019-10-25 NOTE — Patient Instructions (Signed)
The gluten found in wheat, barley, rye

## 2019-10-25 NOTE — Assessment & Plan Note (Signed)
Myrbetriq

## 2019-10-25 NOTE — Progress Notes (Signed)
Subjective:  [name], female    DOB: 28-Nov-1934  Age: 85 y.o. MRN: [id number]  CC: No chief complaint on file.   HPI Canary Schommer presents for anxiety, HTN, OAB, dyslipidemia f/u  Outpatient Medications Prior to Visit  Medication Sig Dispense Refill  . ALPRAZolam (XANAX) 1 MG tablet Take 0.5-1 tablets (0.5-1 mg total) by mouth at bedtime as needed for anxiety or sleep. 30 tablet 5  . AMITIZA 24 MCG capsule TAKE ONE CAPSULE BY MOUTH TWICE A DAY AS NEEDED FOR CONSTIPATION 60 capsule 2  . butalbital-acetaminophen-caffeine (FIORICET) 50-325-40 MG tablet Take 1-2 tablets by mouth 2 (two) times daily as needed for headache. 20 tablet 0  . captopril (CAPOTEN) 50 MG tablet Take 1 tablet (50 mg total) by mouth 2 (two) times daily. 180 tablet 3  . Cholecalciferol 1000 UNITS tablet Take 1,000 Units by mouth daily.      Marland Kitchen conjugated estrogens (PREMARIN) vaginal cream Use pv daily x 2 week, then pv twice weekly for 8 weeks then discontinue 42.5 g 1  . dicyclomine (BENTYL) 10 MG capsule TAKE 1 CAPSULE (10 MG TOTAL) BY MOUTH 3 (THREE) TIMES DAILY AS NEEDED FOR SPASMS. 270 capsule 2  . dipyridamole-aspirin (AGGRENOX) 200-25 MG 12hr capsule Take 1 capsule by mouth 2 (two) times daily. 180 capsule 3  . ezetimibe (ZETIA) 10 MG tablet Take 1 tablet (10 mg total) by mouth daily. 90 tablet 3  . ferrous gluconate (IRON 27) 240 (27 FE) MG tablet Take 1 tablet (240 mg total) by mouth daily. 90 tablet 1  . meloxicam (MOBIC) 7.5 MG tablet Take 1 tablet (7.5 mg total) by mouth daily. 30 tablet 3  . MYRBETRIQ 25 MG TB24 tablet Take 1 tablet (25 mg total) by mouth daily as needed. 90 tablet 3  . omeprazole (PRILOSEC) 20 MG capsule TAKE 2 CAPSULES BY MOUTH EVERY DAY 180 capsule 3  . Pancrelipase, Lip-Prot-Amyl, (CREON) 24000-76000 units CPEP TAKE ONE CAPSULE BY MOUTH 3 TIMES A DAY WITH FOOD 270 capsule 3  . polyethylene glycol powder (GLYCOLAX/MIRALAX) powder MIX 17 GM AS DIRECTED AND DRINK TWICE DAILY AS  NEEDED FOR MODERATE CONSTIPATION 527 g 5  . pravastatin (PRAVACHOL) 20 MG tablet Take 1 tablet (20 mg total) by mouth daily. 30 tablet 11  . propranolol ER (INDERAL LA) 120 MG 24 hr capsule Take 1 capsule (120 mg total) by mouth daily. 90 capsule 3  . RESTASIS 0.05 % ophthalmic emulsion     . topiramate (TOPAMAX) 50 MG tablet Take 50 mg by mouth daily.  1   Facility-Administered Medications Prior to Visit  Medication Dose Route Frequency Provider Last Rate Last Dose  . 0.9 %  sodium chloride infusion  500 mL Intravenous Continuous Milus Banister, MD        ROS: Review of Systems  Constitutional: Negative for activity change, appetite change, chills, fatigue and unexpected weight change.  HENT: Negative for congestion, mouth sores and sinus pressure.   Eyes: Negative for visual disturbance.  Respiratory: Negative for cough and chest tightness.   Gastrointestinal: Negative for abdominal pain and nausea.  Genitourinary: Negative for difficulty urinating, frequency and vaginal pain.  Musculoskeletal: Positive for arthralgias. Negative for back pain and gait problem.  Skin: Negative for pallor and rash.  Neurological: Negative for dizziness, tremors, weakness, numbness and headaches.  Psychiatric/Behavioral: Positive for sleep disturbance. Negative for confusion. The patient is nervous/anxious.     Objective:  BP 132/68 (BP Location: Left Arm, Patient Position:  Sitting, Cuff Size: Normal)   Pulse 60   Temp 98 F (36.7 C) (Oral)   Ht 5' (1.524 m)   Wt 122 lb 1.9 oz (55.4 kg)   SpO2 99%   BMI 23.85 kg/m   BP Readings from Last 3 Encounters:  10/25/19 132/68  06/20/19 138/70  02/16/19 134/82    Wt Readings from Last 3 Encounters:  10/25/19 122 lb 1.9 oz (55.4 kg)  06/20/19 123 lb (55.8 kg)  02/16/19 129 lb (58.5 kg)    Physical Exam Constitutional:      General: She is not in acute distress.    Appearance: She is well-developed.  HENT:     Head: Normocephalic.      Right Ear: External ear normal.     Left Ear: External ear normal.     Nose: Nose normal.  Eyes:     General:        Right eye: No discharge.        Left eye: No discharge.     Conjunctiva/sclera: Conjunctivae normal.     Pupils: Pupils are equal, round, and reactive to light.  Neck:     Musculoskeletal: Normal range of motion and neck supple.     Thyroid: No thyromegaly.     Vascular: No JVD.     Trachea: No tracheal deviation.  Cardiovascular:     Rate and Rhythm: Normal rate and regular rhythm.     Heart sounds: Normal heart sounds.  Pulmonary:     Effort: No respiratory distress.     Breath sounds: No stridor. No wheezing.  Abdominal:     General: Bowel sounds are normal. There is no distension.     Palpations: Abdomen is soft. There is no mass.     Tenderness: There is no abdominal tenderness. There is no guarding or rebound.  Musculoskeletal:        General: No tenderness.  Lymphadenopathy:     Cervical: No cervical adenopathy.  Skin:    Findings: No erythema or rash.  Neurological:     Cranial Nerves: No cranial nerve deficit.     Motor: No abnormal muscle tone.     Coordination: Coordination normal.     Deep Tendon Reflexes: Reflexes normal.  Psychiatric:        Behavior: Behavior normal.        Thought Content: Thought content normal.        Judgment: Judgment normal.   hernias  Lab Results  Component Value Date   WBC 5.1 02/16/2019   HGB 11.1 (L) 02/16/2019   HCT 32.9 (L) 02/16/2019   PLT 218.0 02/16/2019   GLUCOSE 108 (H) 06/20/2019   CHOL 200 06/20/2019   TRIG 120.0 06/20/2019   HDL 53.20 06/20/2019   LDLDIRECT 143.0 03/02/2018   LDLCALC 123 (H) 06/20/2019   ALT 26 06/20/2019   AST 17 06/20/2019   NA 131 (L) 06/20/2019   K 4.3 06/20/2019   CL 99 06/20/2019   CREATININE 0.77 06/20/2019   BUN 16 06/20/2019   CO2 26 06/20/2019   TSH 1.99 10/19/2018   HGBA1C 5.9 09/29/2007    Dg Abd 1 View  Result Date: 12/02/2017 CLINICAL DATA:  Chronic  lower abdominal pain and constipation. EXAM: ABDOMEN - 1 VIEW COMPARISON:  09/03/2016 FINDINGS: The bowel gas pattern is normal. No excessive stool in the colon. No fecal impaction. Stable calcified 3.7 cm fibroid in the uterus. Chronic lumbar scoliosis. Aortic atherosclerosis. IMPRESSION: No acute abnormality of the abdomen. Specifically,  no evidence of excessive stool or fecal impaction. Electronically Signed   By: Lorriane Shire M.D.   On: 12/02/2017 16:49    Assessment & Plan:   There are no diagnoses linked to this encounter.   No orders of the defined types were placed in this encounter.    Follow-up: No follow-ups on file.  Walker Kehr, MD

## 2019-10-25 NOTE — Assessment & Plan Note (Signed)
Fioricet rare  Potential benefits of a long term fioricet  use as well as potential risks  and complications were explained to the patient and were aknowledged.

## 2019-10-25 NOTE — Assessment & Plan Note (Signed)
Lorazepam prn  Potential benefits of a long term benzodiazepines  use as well as potential risks  and complications were explained to the patient and were aknowledged.  

## 2019-10-25 NOTE — Assessment & Plan Note (Signed)
On Zetia 

## 2019-12-05 ENCOUNTER — Other Ambulatory Visit: Payer: Self-pay | Admitting: Internal Medicine

## 2020-01-05 ENCOUNTER — Other Ambulatory Visit: Payer: Self-pay | Admitting: Internal Medicine

## 2020-01-12 ENCOUNTER — Ambulatory Visit: Payer: Medicare Other

## 2020-01-21 ENCOUNTER — Ambulatory Visit: Payer: Medicare Other | Attending: Internal Medicine

## 2020-01-21 DIAGNOSIS — Z23 Encounter for immunization: Secondary | ICD-10-CM | POA: Insufficient documentation

## 2020-01-21 NOTE — Progress Notes (Signed)
   Covid-19 Vaccination Clinic  Name:  Sophia Santos    MRN: [id number] DOB: 1934-04-24  01/21/2020  [name] was observed post Covid-19 immunization for 15 minutes without incidence. She was provided with Vaccine Information Sheet and instruction to access the V-Safe system.   [name] was instructed to call 911 with any severe reactions post vaccine: Marland Kitchen Difficulty breathing  . Swelling of your face and throat  . A fast heartbeat  . A bad rash all over your body  . Dizziness and weakness    Immunizations Administered    Name Date Dose VIS Date Route   Pfizer COVID-19 Vaccine 01/21/2020 12:09 PM 0.3 mL 11/25/2019 Intramuscular   Manufacturer: [place]   Lot: [id number]   [place]: [id number]

## 2020-02-08 DIAGNOSIS — G43019 Migraine without aura, intractable, without status migrainosus: Secondary | ICD-10-CM | POA: Diagnosis not present

## 2020-02-08 DIAGNOSIS — G43719 Chronic migraine without aura, intractable, without status migrainosus: Secondary | ICD-10-CM | POA: Diagnosis not present

## 2020-02-15 ENCOUNTER — Ambulatory Visit: Payer: Medicare Other | Attending: Internal Medicine

## 2020-02-15 DIAGNOSIS — Z23 Encounter for immunization: Secondary | ICD-10-CM

## 2020-02-15 NOTE — Progress Notes (Signed)
   Covid-19 Vaccination Clinic  Name:  Sophia Santos    MRN: [id number] DOB: Jul 14, 1934  02/15/2020  [name] was observed post Covid-19 immunization for 15 minutes without incident. She was provided with Vaccine Information Sheet and instruction to access the V-Safe system.   [name] was instructed to call 911 with any severe reactions post vaccine: Marland Kitchen Difficulty breathing  . Swelling of face and throat  . A fast heartbeat  . A bad rash all over body  . Dizziness and weakness   Immunizations Administered    Name Date Dose VIS Date Route   Pfizer COVID-19 Vaccine 02/15/2020  8:43 AM 0.3 mL 11/25/2019 Intramuscular   Manufacturer: [place]   Lot: [id number]   [place]: [id number]

## 2020-02-22 ENCOUNTER — Ambulatory Visit: Payer: Medicare Other | Admitting: Internal Medicine

## 2020-02-22 DIAGNOSIS — H11823 Conjunctivochalasis, bilateral: Secondary | ICD-10-CM | POA: Diagnosis not present

## 2020-02-22 DIAGNOSIS — H01003 Unspecified blepharitis right eye, unspecified eyelid: Secondary | ICD-10-CM | POA: Diagnosis not present

## 2020-02-22 DIAGNOSIS — Z9841 Cataract extraction status, right eye: Secondary | ICD-10-CM | POA: Diagnosis not present

## 2020-02-22 DIAGNOSIS — Z961 Presence of intraocular lens: Secondary | ICD-10-CM | POA: Diagnosis not present

## 2020-02-22 DIAGNOSIS — Z9842 Cataract extraction status, left eye: Secondary | ICD-10-CM | POA: Diagnosis not present

## 2020-02-23 ENCOUNTER — Other Ambulatory Visit: Payer: Self-pay

## 2020-02-23 ENCOUNTER — Encounter: Payer: Self-pay | Admitting: Internal Medicine

## 2020-02-23 ENCOUNTER — Ambulatory Visit (INDEPENDENT_AMBULATORY_CARE_PROVIDER_SITE_OTHER): Payer: Medicare Other | Admitting: Internal Medicine

## 2020-02-23 DIAGNOSIS — I10 Essential (primary) hypertension: Secondary | ICD-10-CM

## 2020-02-23 DIAGNOSIS — K581 Irritable bowel syndrome with constipation: Secondary | ICD-10-CM

## 2020-02-23 DIAGNOSIS — R7989 Other specified abnormal findings of blood chemistry: Secondary | ICD-10-CM

## 2020-02-23 DIAGNOSIS — K30 Functional dyspepsia: Secondary | ICD-10-CM | POA: Diagnosis not present

## 2020-02-23 DIAGNOSIS — R1010 Upper abdominal pain, unspecified: Secondary | ICD-10-CM

## 2020-02-23 DIAGNOSIS — N3000 Acute cystitis without hematuria: Secondary | ICD-10-CM

## 2020-02-23 DIAGNOSIS — R748 Abnormal levels of other serum enzymes: Secondary | ICD-10-CM

## 2020-02-23 DIAGNOSIS — T148XXA Other injury of unspecified body region, initial encounter: Secondary | ICD-10-CM | POA: Diagnosis not present

## 2020-02-23 DIAGNOSIS — K219 Gastro-esophageal reflux disease without esophagitis: Secondary | ICD-10-CM

## 2020-02-23 LAB — CBC WITH DIFFERENTIAL/PLATELET
Basophils Absolute: 0 10*3/uL (ref 0.0–0.1)
Basophils Relative: 0.7 % (ref 0.0–3.0)
Eosinophils Absolute: 0.1 10*3/uL (ref 0.0–0.7)
Eosinophils Relative: 1.7 % (ref 0.0–5.0)
HCT: 34.2 % — ABNORMAL LOW (ref 36.0–46.0)
Hemoglobin: 11.5 g/dL — ABNORMAL LOW (ref 12.0–15.0)
Lymphocytes Relative: 41.5 % (ref 12.0–46.0)
Lymphs Abs: 1.9 10*3/uL (ref 0.7–4.0)
MCHC: 33.5 g/dL (ref 30.0–36.0)
MCV: 97.3 fl (ref 78.0–100.0)
Monocytes Absolute: 0.4 10*3/uL (ref 0.1–1.0)
Monocytes Relative: 7.8 % (ref 3.0–12.0)
Neutro Abs: 2.2 10*3/uL (ref 1.4–7.7)
Neutrophils Relative %: 48.3 % (ref 43.0–77.0)
Platelets: 243 10*3/uL (ref 150.0–400.0)
RBC: 3.52 Mil/uL — ABNORMAL LOW (ref 3.87–5.11)
RDW: 13.4 % (ref 11.5–15.5)
WBC: 4.6 10*3/uL (ref 4.0–10.5)

## 2020-02-23 LAB — URINALYSIS, ROUTINE W REFLEX MICROSCOPIC
Bilirubin Urine: NEGATIVE
Ketones, ur: NEGATIVE
Nitrite: POSITIVE — AB
Specific Gravity, Urine: 1.015 (ref 1.000–1.030)
Total Protein, Urine: NEGATIVE
Urine Glucose: NEGATIVE
Urobilinogen, UA: 1 (ref 0.0–1.0)
pH: 7 (ref 5.0–8.0)

## 2020-02-23 LAB — HEPATIC FUNCTION PANEL
ALT: 41 U/L — ABNORMAL HIGH (ref 0–35)
AST: 31 U/L (ref 0–37)
Albumin: 4 g/dL (ref 3.5–5.2)
Alkaline Phosphatase: 107 U/L (ref 39–117)
Bilirubin, Direct: 0.1 mg/dL (ref 0.0–0.3)
Total Bilirubin: 0.4 mg/dL (ref 0.2–1.2)
Total Protein: 6.9 g/dL (ref 6.0–8.3)

## 2020-02-23 LAB — LIPID PANEL
Cholesterol: 221 mg/dL — ABNORMAL HIGH (ref 0–200)
HDL: 68.3 mg/dL (ref >39.00)
LDL Cholesterol: 137 mg/dL — ABNORMAL HIGH (ref 0–99)
NonHDL: 152.2
Total CHOL/HDL Ratio: 3
Triglycerides: 74 mg/dL (ref 0.0–149.0)
VLDL: 14.8 mg/dL (ref 0.0–40.0)

## 2020-02-23 LAB — BASIC METABOLIC PANEL
BUN: 27 mg/dL — ABNORMAL HIGH (ref 6–23)
CO2: 28 mEq/L (ref 19–32)
Calcium: 9.1 mg/dL (ref 8.4–10.5)
Chloride: 103 mEq/L (ref 96–112)
Creatinine, Ser: 0.8 mg/dL (ref 0.40–1.20)
GFR: 68.34 mL/min (ref >60.00)
Glucose, Bld: 103 mg/dL — ABNORMAL HIGH (ref 70–99)
Potassium: 4.2 mEq/L (ref 3.5–5.1)
Sodium: 135 mEq/L (ref 135–145)

## 2020-02-23 LAB — PROTIME-INR
INR: 1 ratio (ref 0.8–1.0)
Prothrombin Time: 11.3 s (ref 9.6–13.1)

## 2020-02-23 LAB — TSH: TSH: 1.22 u[IU]/mL (ref 0.35–4.50)

## 2020-02-23 MED ORDER — CAPTOPRIL 50 MG PO TABS: 50.0000 mg | ORAL_TABLET | Freq: Two times a day (BID) | ORAL | 3 refills | Status: DC

## 2020-02-23 MED ORDER — ALPRAZOLAM 1 MG PO TABS: 0.5000 mg | ORAL_TABLET | Freq: Every evening | ORAL | 1 refills | Status: DC | PRN

## 2020-02-23 MED ORDER — ASPIRIN-DIPYRIDAMOLE ER 25-200 MG PO CP12: 1.0000 | ORAL_CAPSULE | Freq: Two times a day (BID) | ORAL | 3 refills | Status: DC

## 2020-02-23 MED ORDER — DICYCLOMINE HCL 10 MG PO CAPS: 10.0000 mg | ORAL_CAPSULE | Freq: Three times a day (TID) | ORAL | 2 refills | Status: DC | PRN

## 2020-02-23 NOTE — Progress Notes (Signed)
Subjective:  [name], female    DOB: Sep 13, 1934  Age: 86 y.o. MRN: [id number]  CC: No chief complaint on file.   HPI Sophia Santos presents for HTN, anxiety, HAs C/o upper abd pain and pressure at times - vague sx's for a long time F/u HH - on Omeprazole F/u constipation - Miralax prn C/o bruising  Outpatient Medications Prior to Visit  Medication Sig Dispense Refill  . ALPRAZolam (XANAX) 1 MG tablet Take 0.5-1 tablets (0.5-1 mg total) by mouth at bedtime as needed for anxiety or sleep. 90 tablet 1  . AMITIZA 24 MCG capsule TAKE ONE CAPSULE BY MOUTH TWICE A DAY AS NEEDED FOR CONSTIPATION 60 capsule 2  . butalbital-acetaminophen-caffeine (FIORICET) 50-325-40 MG tablet Take 1-2 tablets by mouth 2 (two) times daily as needed for headache. 20 tablet 0  . captopril (CAPOTEN) 50 MG tablet Take 1 tablet (50 mg total) by mouth 2 (two) times daily. 180 tablet 3  . Cholecalciferol 1000 UNITS tablet Take 1,000 Units by mouth daily.      Marland Kitchen conjugated estrogens (PREMARIN) vaginal cream Use pv daily x 2 week, then pv twice weekly for 8 weeks then discontinue 42.5 g 1  . dicyclomine (BENTYL) 10 MG capsule TAKE 1 CAPSULE (10 MG TOTAL) BY MOUTH 3 (THREE) TIMES DAILY AS NEEDED FOR SPASMS. 270 capsule 2  . dipyridamole-aspirin (AGGRENOX) 200-25 MG 12hr capsule Take 1 capsule by mouth 2 (two) times daily. 180 capsule 3  . ezetimibe (ZETIA) 10 MG tablet Take 1 tablet (10 mg total) by mouth daily. 90 tablet 3  . ferrous gluconate (IRON 27) 240 (27 FE) MG tablet Take 1 tablet (240 mg total) by mouth daily. 90 tablet 1  . meloxicam (MOBIC) 7.5 MG tablet Take 1 tablet (7.5 mg total) by mouth daily. 30 tablet 3  . MYRBETRIQ 25 MG TB24 tablet Take 1 tablet (25 mg total) by mouth daily as needed. 90 tablet 3  . omeprazole (PRILOSEC) 20 MG capsule TAKE 2 CAPSULES BY MOUTH EVERY DAY 180 capsule 3  . Pancrelipase, Lip-Prot-Amyl, (CREON) 24000-76000 units CPEP TAKE ONE CAPSULE BY MOUTH 3 TIMES A DAY WITH  FOOD 270 capsule 3  . polyethylene glycol powder (GLYCOLAX/MIRALAX) 17 GM/SCOOP powder MIX 17 GM AS DIRECTED AND DRINK TWICE DAILY AS NEEDED FOR MODERATE CONSTIPATION 527 g 5  . propranolol ER (INDERAL LA) 120 MG 24 hr capsule TAKE 1 CAPSULE BY MOUTH EVERY DAY 90 capsule 3  . RESTASIS 0.05 % ophthalmic emulsion     . topiramate (TOPAMAX) 50 MG tablet Take 50 mg by mouth daily.  1   Facility-Administered Medications Prior to Visit  Medication Dose Route Frequency Provider Last Rate Last Admin  . 0.9 %  sodium chloride infusion  500 mL Intravenous Continuous Milus Banister, MD        ROS: Review of Systems  Constitutional: Positive for fatigue. Negative for activity change, appetite change, chills and unexpected weight change.  HENT: Negative for congestion, mouth sores and sinus pressure.   Eyes: Negative for visual disturbance.  Respiratory: Negative for cough and chest tightness.   Gastrointestinal: Positive for abdominal pain. Negative for nausea.  Genitourinary: Negative for difficulty urinating, frequency and vaginal pain.  Musculoskeletal: Positive for back pain. Negative for gait problem.  Skin: Negative for pallor and rash.  Neurological: Negative for dizziness, tremors, weakness, numbness and headaches.  Psychiatric/Behavioral: Negative for confusion, sleep disturbance and suicidal ideas.    Objective:  BP 128/72 (BP Location: Left  Arm, Patient Position: Sitting, Cuff Size: Normal)   Pulse (!) 58   Temp 97.7 F (36.5 C) (Oral)   Ht 5\' 1"  (1.549 m)   Wt 124 lb (56.2 kg)   SpO2 99%   BMI 23.43 kg/m   BP Readings from Last 3 Encounters:  02/23/20 128/72  10/25/19 132/68  06/20/19 138/70    Wt Readings from Last 3 Encounters:  02/23/20 124 lb (56.2 kg)  10/25/19 122 lb 1.9 oz (55.4 kg)  06/20/19 123 lb (55.8 kg)    Physical Exam Constitutional:      General: She is not in acute distress.    Appearance: She is well-developed.  HENT:     Head: Normocephalic.       Right Ear: External ear normal.     Left Ear: External ear normal.     Nose: Nose normal.  Eyes:     General:        Right eye: No discharge.        Left eye: No discharge.     Conjunctiva/sclera: Conjunctivae normal.     Pupils: Pupils are equal, round, and reactive to light.  Neck:     Thyroid: No thyromegaly.     Vascular: No JVD.     Trachea: No tracheal deviation.  Cardiovascular:     Rate and Rhythm: Normal rate and regular rhythm.     Heart sounds: Normal heart sounds.  Pulmonary:     Effort: No respiratory distress.     Breath sounds: No stridor. No wheezing.  Abdominal:     General: Bowel sounds are normal. There is no distension.     Palpations: Abdomen is soft. There is no mass.     Tenderness: There is no abdominal tenderness. There is no guarding or rebound.  Musculoskeletal:        General: Tenderness present.     Cervical back: Normal range of motion and neck supple.  Lymphadenopathy:     Cervical: No cervical adenopathy.  Skin:    Findings: No erythema or rash.  Neurological:     Cranial Nerves: No cranial nerve deficit.     Motor: No abnormal muscle tone.     Coordination: Coordination abnormal.     Gait: Gait abnormal.     Deep Tendon Reflexes: Reflexes normal.  Psychiatric:        Behavior: Behavior normal.        Thought Content: Thought content normal.        Judgment: Judgment normal.   slow LS tender w/ROM Stiff Mild bruising  FTF>45 min discussing the patient's multiple medical problems, including cultural and language barriers.  Multiple questions regarding her health care, medicines were discussed.  Long-term migraines in their significance for the patient were discussed.  Treatment discussed.  Lab Results  Component Value Date   WBC 5.1 02/16/2019   HGB 11.1 (L) 02/16/2019   HCT 32.9 (L) 02/16/2019   PLT 218.0 02/16/2019   GLUCOSE 108 (H) 06/20/2019   CHOL 200 06/20/2019   TRIG 120.0 06/20/2019   HDL 53.20 06/20/2019    LDLDIRECT 143.0 03/02/2018   LDLCALC 123 (H) 06/20/2019   ALT 26 06/20/2019   AST 17 06/20/2019   NA 131 (L) 06/20/2019   K 4.3 06/20/2019   CL 99 06/20/2019   CREATININE 0.77 06/20/2019   BUN 16 06/20/2019   CO2 26 06/20/2019   TSH 1.99 10/19/2018   HGBA1C 5.9 09/29/2007    DG Abd 1 View  Result  Date: 12/02/2017 CLINICAL DATA:  Chronic lower abdominal pain and constipation. EXAM: ABDOMEN - 1 VIEW COMPARISON:  09/03/2016 FINDINGS: The bowel gas pattern is normal. No excessive stool in the colon. No fecal impaction. Stable calcified 3.7 cm fibroid in the uterus. Chronic lumbar scoliosis. Aortic atherosclerosis. IMPRESSION: No acute abnormality of the abdomen. Specifically, no evidence of excessive stool or fecal impaction. Electronically Signed   By: Lorriane Shire M.D.   On: 12/02/2017 16:49    Assessment & Plan:     Walker Kehr, MD

## 2020-02-23 NOTE — Assessment & Plan Note (Signed)
INR, CBC On Aggrenox

## 2020-02-23 NOTE — Assessment & Plan Note (Signed)
Labs Abd US 

## 2020-02-23 NOTE — Assessment & Plan Note (Signed)
CBC

## 2020-02-23 NOTE — Assessment & Plan Note (Signed)
Captopril

## 2020-02-23 NOTE — Assessment & Plan Note (Signed)
US

## 2020-02-23 NOTE — Assessment & Plan Note (Signed)
US abd.

## 2020-02-23 NOTE — Assessment & Plan Note (Signed)
HH Prilosec

## 2020-02-24 ENCOUNTER — Other Ambulatory Visit: Payer: Medicare Other

## 2020-02-24 DIAGNOSIS — R8290 Unspecified abnormal findings in urine: Secondary | ICD-10-CM | POA: Diagnosis not present

## 2020-02-26 ENCOUNTER — Encounter: Payer: Self-pay | Admitting: Internal Medicine

## 2020-02-26 ENCOUNTER — Other Ambulatory Visit: Payer: Self-pay | Admitting: Internal Medicine

## 2020-02-26 LAB — URINE CULTURE
MICRO NUMBER:: 10245939
SPECIMEN QUALITY:: ADEQUATE

## 2020-02-26 MED ORDER — NITROFURANTOIN MONOHYD MACRO 100 MG PO CAPS: 100.0000 mg | ORAL_CAPSULE | Freq: Two times a day (BID) | ORAL | 0 refills | Status: AC

## 2020-02-26 NOTE — Assessment & Plan Note (Signed)
E. coli, treat with Macrobid

## 2020-03-07 ENCOUNTER — Ambulatory Visit
Admission: RE | Admit: 2020-03-07 | Discharge: 2020-03-07 | Disposition: A | Discharge status: Home / Self Care | Payer: Medicare Other | Source: Ambulatory Visit | Attending: Internal Medicine | Admitting: Internal Medicine

## 2020-03-07 DIAGNOSIS — K8020 Calculus of gallbladder without cholecystitis without obstruction: Secondary | ICD-10-CM | POA: Diagnosis not present

## 2020-03-13 ENCOUNTER — Other Ambulatory Visit: Payer: Self-pay | Admitting: Internal Medicine

## 2020-03-13 DIAGNOSIS — K8020 Calculus of gallbladder without cholecystitis without obstruction: Secondary | ICD-10-CM | POA: Insufficient documentation

## 2020-03-15 ENCOUNTER — Other Ambulatory Visit (HOSPITAL_COMMUNITY): Payer: Self-pay | Admitting: Obstetrics & Gynecology

## 2020-03-15 DIAGNOSIS — N952 Postmenopausal atrophic vaginitis: Secondary | ICD-10-CM

## 2020-03-15 DIAGNOSIS — Z1231 Encounter for screening mammogram for malignant neoplasm of breast: Secondary | ICD-10-CM | POA: Diagnosis not present

## 2020-03-15 LAB — HM MAMMOGRAPHY

## 2020-03-21 ENCOUNTER — Telehealth (INDEPENDENT_AMBULATORY_CARE_PROVIDER_SITE_OTHER): Payer: Medicare Other | Admitting: *Deleted

## 2020-03-21 DIAGNOSIS — Z7689 Persons encountering health services in other specified circumstances: Secondary | ICD-10-CM

## 2020-03-21 NOTE — Telephone Encounter (Signed)
Pt called to office with questions about her cream Rx- ?Premarin. Pt uses [facility] on [address].  Pt has been seen at [facility] and call will be routed to clinical pool.

## 2020-03-22 NOTE — Telephone Encounter (Signed)
Called patient with pacific interpreter [phone] stating I am trying to return her phone call. Patient states she was originally calling because she was out of cream but then received a phone call from [facility] that it was ready for pick up. Patient states she heard we were moving and wants our new address and wants to know if Dr Roselie Awkward will be there. Told patient Dr Roselie Awkward will still be there and provided new address with directions. Patient verbalized understanding.

## 2020-03-24 ENCOUNTER — Encounter (HOSPITAL_COMMUNITY): Payer: Self-pay | Admitting: Emergency Medicine

## 2020-03-24 ENCOUNTER — Emergency Department (HOSPITAL_COMMUNITY)
Admission: EM | Admit: 2020-03-24 | Discharge: 2020-03-24 | Disposition: A | Discharge status: Home / Self Care | Payer: Medicare Other | Attending: Emergency Medicine | Admitting: Emergency Medicine

## 2020-03-24 ENCOUNTER — Other Ambulatory Visit: Payer: Self-pay

## 2020-03-24 DIAGNOSIS — K219 Gastro-esophageal reflux disease without esophagitis: Secondary | ICD-10-CM | POA: Diagnosis not present

## 2020-03-24 DIAGNOSIS — Z79899 Other long term (current) drug therapy: Secondary | ICD-10-CM | POA: Insufficient documentation

## 2020-03-24 DIAGNOSIS — I10 Essential (primary) hypertension: Secondary | ICD-10-CM | POA: Insufficient documentation

## 2020-03-24 DIAGNOSIS — K4021 Bilateral inguinal hernia, without obstruction or gangrene, recurrent: Secondary | ICD-10-CM | POA: Diagnosis not present

## 2020-03-24 DIAGNOSIS — K589 Irritable bowel syndrome without diarrhea: Secondary | ICD-10-CM | POA: Diagnosis not present

## 2020-03-24 DIAGNOSIS — K5730 Diverticulosis of large intestine without perforation or abscess without bleeding: Secondary | ICD-10-CM | POA: Insufficient documentation

## 2020-03-24 DIAGNOSIS — R1030 Lower abdominal pain, unspecified: Secondary | ICD-10-CM | POA: Insufficient documentation

## 2020-03-24 DIAGNOSIS — R110 Nausea: Secondary | ICD-10-CM | POA: Diagnosis not present

## 2020-03-24 DIAGNOSIS — K469 Unspecified abdominal hernia without obstruction or gangrene: Secondary | ICD-10-CM | POA: Diagnosis not present

## 2020-03-24 DIAGNOSIS — R1084 Generalized abdominal pain: Secondary | ICD-10-CM | POA: Diagnosis not present

## 2020-03-24 DIAGNOSIS — Z743 Need for continuous supervision: Secondary | ICD-10-CM | POA: Diagnosis not present

## 2020-03-24 LAB — URINALYSIS, ROUTINE W REFLEX MICROSCOPIC
Bilirubin Urine: NEGATIVE
Glucose, UA: NEGATIVE mg/dL
Hgb urine dipstick: NEGATIVE
Ketones, ur: NEGATIVE mg/dL
Leukocytes,Ua: NEGATIVE
Nitrite: NEGATIVE
Protein, ur: NEGATIVE mg/dL
Specific Gravity, Urine: 1.003 — ABNORMAL LOW (ref 1.005–1.030)
pH: 7 (ref 5.0–8.0)

## 2020-03-24 LAB — CBC
HCT: 38.6 % (ref 36.0–46.0)
Hemoglobin: 12.9 g/dL (ref 12.0–15.0)
MCH: 32.6 pg (ref 26.0–34.0)
MCHC: 33.4 g/dL (ref 30.0–36.0)
MCV: 97.5 fL (ref 80.0–100.0)
Platelets: 265 10*3/uL (ref 150–400)
RBC: 3.96 MIL/uL (ref 3.87–5.11)
RDW: 12.5 % (ref 11.5–15.5)
WBC: 9.8 10*3/uL (ref 4.0–10.5)
nRBC: 0 % (ref 0.0–0.2)

## 2020-03-24 LAB — COMPREHENSIVE METABOLIC PANEL
ALT: 19 U/L (ref 0–44)
AST: 23 U/L (ref 15–41)
Albumin: 4 g/dL (ref 3.5–5.0)
Alkaline Phosphatase: 73 U/L (ref 38–126)
Anion gap: 9 (ref 5–15)
BUN: 11 mg/dL (ref 8–23)
CO2: 25 mmol/L (ref 22–32)
Calcium: 9.2 mg/dL (ref 8.9–10.3)
Chloride: 97 mmol/L — ABNORMAL LOW (ref 98–111)
Creatinine, Ser: 0.72 mg/dL (ref 0.44–1.00)
GFR calc Af Amer: >60 mL/min (ref >60)
GFR calc non Af Amer: >60 mL/min (ref >60)
Glucose, Bld: 123 mg/dL — ABNORMAL HIGH (ref 70–99)
Potassium: 3.8 mmol/L (ref 3.5–5.1)
Sodium: 131 mmol/L — ABNORMAL LOW (ref 135–145)
Total Bilirubin: 0.3 mg/dL (ref 0.3–1.2)
Total Protein: 7.3 g/dL (ref 6.5–8.1)

## 2020-03-24 LAB — LIPASE, BLOOD: Lipase: 24 U/L (ref 11–51)

## 2020-03-24 MED ORDER — SODIUM CHLORIDE 0.9 % IV BOLUS
1000.0000 mL | Freq: Once | INTRAVENOUS | Status: AC
  Administered 2020-03-24: 1000 mL via INTRAVENOUS

## 2020-03-24 MED ORDER — HYDROCODONE-ACETAMINOPHEN 5-325 MG PO TABS: 1.0000 | ORAL_TABLET | Freq: Four times a day (QID) | ORAL | 0 refills | Status: DC | PRN

## 2020-03-24 MED ORDER — MORPHINE SULFATE (PF) 4 MG/ML IV SOLN
4.0000 mg | Freq: Once | INTRAVENOUS | Status: AC
  Administered 2020-03-24: 18:00:00 4 mg via INTRAVENOUS
  Filled 2020-03-24: qty 1

## 2020-03-24 MED ORDER — SODIUM CHLORIDE 0.9% FLUSH: 3.0000 mL | Freq: Once | INTRAVENOUS | Status: DC

## 2020-03-24 MED ORDER — ONDANSETRON HCL 4 MG/2ML IJ SOLN
4.0000 mg | Freq: Once | INTRAMUSCULAR | Status: AC
  Administered 2020-03-24: 4 mg via INTRAVENOUS
  Filled 2020-03-24: qty 2

## 2020-03-24 NOTE — ED Triage Notes (Signed)
Pt to triage via [facility] from home.  Hit her life alert for EMS to come due to generalized abd pain all day.  Dx with inguinal hernia 2/27.  Denies nausea, vomiting, and diarrhea.

## 2020-03-24 NOTE — Discharge Instructions (Addendum)
You have bilateral inguinal hernias that is what is hurting you.  Please avoid picking up anything heavy  See your doctor   Return to ER if you have worse abdominal pain, vomiting, fever, hernia not reducible

## 2020-03-24 NOTE — ED Provider Notes (Signed)
[facility] EMERGENCY DEPARTMENT Provider Note   CSN: [id number] Arrival date & time: 03/24/20  1723     History Chief Complaint  Patient presents with  . Abdominal Pain    Sophia Santos is a 86 y.o. female history of diverticulosis, hypertension, known inguinal hernia here presenting with inguinal hernia pain.  Patient states that she saw surgeon end of February for her inguinal hernia.  She was diagnosed with bilateral inguinal hernia and was suggested to get surgery.  At that time she did not want the surgery.  She states that this morning, she had worsening lower abdominal pain and her hernia is more painful now. She felt nauseated but had no vomiting.  She states that she is passing gas.  The history is provided by the patient.       Past Medical History:  Diagnosis Date  . Arthritis   . Blood in stool    with rectal pain   . Cataract    bilateral eyes removed  . Colon polyp   . Diverticulosis   . GERD (gastroesophageal reflux disease)   . HTN (hypertension)   . Hyperlipidemia   . Irritable bowel syndrome (IBS)   . Osteoarthritis   . Osteopenia   . Osteoporosis   . Uterine prolapse     Patient Active Problem List   Diagnosis Date Noted  . Gallstones 03/13/2020  . Bruising 02/23/2020  . Overactive bladder 10/25/2019  . Inguinal hernia 03/02/2018  . Urinary tract infection 02/03/2017  . Well adult exam 11/04/2016  . IBS (irritable bowel syndrome) 11/04/2016  . Pancreatic insufficiency 03/03/2016  . Headache 12/03/2015  . Elevated alkaline phosphatase level 08/03/2015  . Onychomycosis 08/03/2015  . Insomnia 04/02/2015  . Indigestion 04/02/2015  . Chest pain, atypical 12/26/2014  . Pain in left hip 04/13/2014  . Snoring 11/09/2012  . Skin cancer 11/09/2012  . Mass of ear auricle 08/09/2012  . Actinic keratoses 08/09/2012  . Low back pain 10/28/2011  . Preop examination 10/28/2011  . Uterine prolaps 10/09/2011  . Atrophic vaginitis  08/29/2011  . Knee pain, left 07/25/2011  . Anxiety disorder 05/31/2010  . Constipation 05/02/2010  . Abnormal CBC 07/31/2009  . Abdominal pain 08/07/2008  . ABNORMAL GLUCOSE NEC 08/07/2008  . ABDOMINAL XRAY, ABNORMAL 08/07/2008  . CAROTID BRUIT, RIGHT 02/22/2008  . Dyslipidemia 11/21/2007  . OSTEOARTHRITIS 11/21/2007  . OSTEOPENIA 11/21/2007  . Migraines 11/15/2007  . Essential hypertension 09/06/2007  . GERD 09/06/2007  . PALPITATIONS, HX OF 09/06/2007    Past Surgical History:  Procedure Laterality Date  . bilerateral carteracts removed    . COLONOSCOPY    . POLYPECTOMY    . uterine prolapse surgery       OB History    Gravida  1   Para  1   Term  1   Preterm      AB      Living  1     SAB      TAB      Ectopic      Multiple      Live Births              Family History  Problem Relation Age of Onset  . Colon cancer Mother 50  . Stroke Mother   . Parkinson's disease Father   . Dementia Father   . Coronary artery disease Other        female 1st degree <50  . Esophageal cancer Neg Hx   .  Rectal cancer Neg Hx   . Stomach cancer Neg Hx   . Colon polyps Neg Hx     Social History   Tobacco Use  . Smoking status: Never Smoker  . Smokeless tobacco: Never Used  Substance Use Topics  . Alcohol use: No  . Drug use: No    Home Medications Prior to Admission medications   Medication Sig Start Date End Date Taking? Authorizing Provider  ALPRAZolam Duanne Moron) 1 MG tablet Take 0.5-1 tablets (0.5-1 mg total) by mouth at bedtime as needed for anxiety or sleep. 02/23/20   Plotnikov, Evie Lacks, MD  AMITIZA 24 MCG capsule TAKE ONE CAPSULE BY MOUTH TWICE A DAY AS NEEDED FOR CONSTIPATION 10/05/17   Plotnikov, Evie Lacks, MD  butalbital-acetaminophen-caffeine (FIORICET) 50-325-40 MG tablet Take 1-2 tablets by mouth 2 (two) times daily as needed for headache. 06/20/19   Plotnikov, Evie Lacks, MD  captopril (CAPOTEN) 50 MG tablet Take 1 tablet (50 mg total) by mouth  2 (two) times daily. 02/23/20   Plotnikov, Evie Lacks, MD  Cholecalciferol 1000 UNITS tablet Take 1,000 Units by mouth daily.      [provider]  conjugated estrogens (PREMARIN) vaginal cream USE VAGINALLY DAILY FOR 2 WEEKS, THEN TWICE WEEKLY FOR 8 WEEKS, THEN STOP AS DIRECTED 03/15/20   Woodroe Mode, MD  dicyclomine (BENTYL) 10 MG capsule Take 1 capsule (10 mg total) by mouth 3 (three) times daily as needed for spasms. 02/23/20   Plotnikov, Evie Lacks, MD  dipyridamole-aspirin (AGGRENOX) 200-25 MG 12hr capsule Take 1 capsule by mouth 2 (two) times daily. 02/23/20   Plotnikov, Evie Lacks, MD  ezetimibe (ZETIA) 10 MG tablet Take 1 tablet (10 mg total) by mouth daily. 10/25/19   Plotnikov, Evie Lacks, MD  ferrous gluconate (IRON 27) 240 (27 FE) MG tablet Take 1 tablet (240 mg total) by mouth daily. 02/16/19   Plotnikov, Evie Lacks, MD  meloxicam (MOBIC) 7.5 MG tablet Take 1 tablet (7.5 mg total) by mouth daily. 08/03/15   Plotnikov, Evie Lacks, MD  MYRBETRIQ 25 MG TB24 tablet Take 1 tablet (25 mg total) by mouth daily as needed. 10/25/19   Plotnikov, Evie Lacks, MD  omeprazole (PRILOSEC) 20 MG capsule TAKE 2 CAPSULES BY MOUTH EVERY DAY 09/05/19   Plotnikov, Evie Lacks, MD  Pancrelipase, Lip-Prot-Amyl, (CREON) 24000-76000 units CPEP TAKE ONE CAPSULE BY MOUTH 3 TIMES A DAY WITH FOOD 03/02/19   Plotnikov, Evie Lacks, MD  polyethylene glycol powder (GLYCOLAX/MIRALAX) 17 GM/SCOOP powder MIX 17 GM AS DIRECTED AND DRINK TWICE DAILY AS NEEDED FOR MODERATE CONSTIPATION 10/25/19   Plotnikov, Evie Lacks, MD  propranolol ER (INDERAL LA) 120 MG 24 hr capsule TAKE 1 CAPSULE BY MOUTH EVERY DAY 01/05/20   Plotnikov, Evie Lacks, MD  RESTASIS 0.05 % ophthalmic emulsion  03/02/15   [provider]  topiramate (TOPAMAX) 50 MG tablet Take 50 mg by mouth daily. 03/10/15   [provider]    Allergies    Alendronate sodium, Fosamax [alendronate sodium], Lipitor [atorvastatin], Simvastatin, and Voltaren [diclofenac  sodium]  Review of Systems   Review of Systems  Gastrointestinal: Positive for abdominal pain.  All other systems reviewed and are negative.   Physical Exam Updated Vital Signs BP (!) 144/80   Pulse 60   Temp 98 F (36.7 C) (Oral)   Resp 18   SpO2 99%   Physical Exam Vitals and nursing note reviewed.  HENT:     Head: Normocephalic.     Mouth/Throat:  Mouth: Mucous membranes are moist.  Eyes:     Extraocular Movements: Extraocular movements intact.  Cardiovascular:     Rate and Rhythm: Normal rate and regular rhythm.  Pulmonary:     Effort: Pulmonary effort is normal.     Breath sounds: Normal breath sounds.  Abdominal:     General: Abdomen is flat.     Comments: + R inguinal hernia easily reducible, L inguinal hernia that is easily reducible, abdomen nontender otherwise   Skin:    General: Skin is warm.     Capillary Refill: Capillary refill takes less than 2 seconds.  Neurological:     General: No focal deficit present.     Mental Status: She is alert and oriented to person, place, and time.  Psychiatric:        Mood and Affect: Mood normal.        Behavior: Behavior normal.     ED Results / Procedures / Treatments   Labs (all labs ordered are listed, but only abnormal results are displayed) Labs Reviewed  COMPREHENSIVE METABOLIC PANEL - Abnormal; Notable for the following components:      Result Value   Sodium 131 (*)    Chloride 97 (*)    Glucose, Bld 123 (*)    All other components within normal limits  LIPASE, BLOOD  CBC  URINALYSIS, ROUTINE W REFLEX MICROSCOPIC    EKG None  Radiology No results found.  Procedures Procedures (including critical care time)  Hernia reduction I applied manual pressure to bilateral inguinal hernia and able to reducible them, no complications   Medications Ordered in ED Medications  sodium chloride flush (NS) 0.9 % injection 3 mL (3 mLs Intravenous Not Given 03/24/20 1826)  sodium chloride 0.9 % bolus 1,000  mL (1,000 mLs Intravenous New Bag/Given 03/24/20 1824)  morphine 4 MG/ML injection 4 mg (4 mg Intravenous Given 03/24/20 1826)  ondansetron (ZOFRAN) injection 4 mg (4 mg Intravenous Given 03/24/20 1826)    ED Course  I have reviewed the triage vital signs and the nursing notes.  Pertinent labs & imaging results that were available during my care of the patient were reviewed by me and considered in my medical decision making (see chart for details).    MDM Rules/Calculators/A&P                      Sophia Santos is a 86 y.o. female history of bilateral inguinal hernia here presenting with pain with inguinal hernia.  Patient has no vomiting and is passing gas .  I was able to reduce the inguinal hernia at bedside.  Patient felt better afterwards. I think she pain from the inguinal hernia site.  There is no signs of incarceration or obstruction on my exam.  Her lab work is unremarkable.  She already has follow-up with her surgeon.  I told her to not pick up anything heavy and take Vicodin as needed for pain.  Final Clinical Impression(s) / ED Diagnoses Final diagnoses:  None    Rx / DC Orders ED Discharge Orders    None       Drenda Freeze, MD 03/24/20 [phone]

## 2020-03-24 NOTE — ED Notes (Signed)
Pt and son verbalized understanding of d/c instructions, follow up care and s/s requiring return to ED. Pt and son did not have any additional questions. Pt transported via wheelchair to exit.

## 2020-03-26 ENCOUNTER — Telehealth: Payer: Self-pay | Admitting: Internal Medicine

## 2020-03-26 NOTE — Telephone Encounter (Signed)
    Patient requesting call , states she was seen on 04/10 in ED for hernia. Seeking advice/ referral?  Please call

## 2020-03-27 NOTE — Telephone Encounter (Signed)
Pleaes advise

## 2020-04-03 NOTE — Telephone Encounter (Signed)
Noted.  I am sorry about her ER visit.  She needs to see a general surgeon for consultation.  Thanks

## 2020-04-04 ENCOUNTER — Ambulatory Visit (INDEPENDENT_AMBULATORY_CARE_PROVIDER_SITE_OTHER): Payer: Medicare Other | Admitting: Internal Medicine

## 2020-04-04 ENCOUNTER — Encounter: Payer: Self-pay | Admitting: Internal Medicine

## 2020-04-04 ENCOUNTER — Other Ambulatory Visit: Payer: Self-pay

## 2020-04-04 VITALS — BP 144/72 | HR 64 | Temp 98.2°F | Ht 62.0 in | Wt 124.0 lb

## 2020-04-04 DIAGNOSIS — F411 Generalized anxiety disorder: Secondary | ICD-10-CM

## 2020-04-04 DIAGNOSIS — K8020 Calculus of gallbladder without cholecystitis without obstruction: Secondary | ICD-10-CM | POA: Diagnosis not present

## 2020-04-04 DIAGNOSIS — R1010 Upper abdominal pain, unspecified: Secondary | ICD-10-CM

## 2020-04-04 DIAGNOSIS — K4020 Bilateral inguinal hernia, without obstruction or gangrene, not specified as recurrent: Secondary | ICD-10-CM | POA: Diagnosis not present

## 2020-04-04 DIAGNOSIS — I10 Essential (primary) hypertension: Secondary | ICD-10-CM

## 2020-04-04 NOTE — Progress Notes (Signed)
Subjective:  [name], female    DOB: 1934-11-10  Age: 86 y.o. MRN: [id number]  CC: No chief complaint on file.   HPI Sophia Santos presents for inguinal hernia, gallstones S/p ER visit for hernia pain on 4/10 F/u anxiety  Per hx: "Sophia Santos is a 86 y.o. female history of bilateral inguinal hernia here presenting with pain with inguinal hernia.  Patient has no vomiting and is passing gas .  I was able to reduce the inguinal hernia at bedside.  Patient felt better afterwards. I think she pain from the inguinal hernia site.  There is no signs of incarceration or obstruction on my exam.  Her lab work is unremarkable.  She already has follow-up with her surgeon.  I told her to not pick up anything heavy and take Vicodin as needed for pain."  Outpatient Medications Prior to Visit  Medication Sig Dispense Refill  . ALPRAZolam (XANAX) 1 MG tablet Take 0.5-1 tablets (0.5-1 mg total) by mouth at bedtime as needed for anxiety or sleep. 90 tablet 1  . AMITIZA 24 MCG capsule TAKE ONE CAPSULE BY MOUTH TWICE A DAY AS NEEDED FOR CONSTIPATION 60 capsule 2  . butalbital-acetaminophen-caffeine (FIORICET) 50-325-40 MG tablet Take 1-2 tablets by mouth 2 (two) times daily as needed for headache. 20 tablet 0  . captopril (CAPOTEN) 50 MG tablet Take 1 tablet (50 mg total) by mouth 2 (two) times daily. 180 tablet 3  . Cholecalciferol 1000 UNITS tablet Take 1,000 Units by mouth daily.      Marland Kitchen conjugated estrogens (PREMARIN) vaginal cream USE VAGINALLY DAILY FOR 2 WEEKS, THEN TWICE WEEKLY FOR 8 WEEKS, THEN STOP AS DIRECTED 30 g 1  . dicyclomine (BENTYL) 10 MG capsule Take 1 capsule (10 mg total) by mouth 3 (three) times daily as needed for spasms. 270 capsule 2  . dipyridamole-aspirin (AGGRENOX) 200-25 MG 12hr capsule Take 1 capsule by mouth 2 (two) times daily. 180 capsule 3  . ezetimibe (ZETIA) 10 MG tablet Take 1 tablet (10 mg total) by mouth daily. 90 tablet 3  . ferrous gluconate (IRON 27) 240 (27  FE) MG tablet Take 1 tablet (240 mg total) by mouth daily. 90 tablet 1  . meloxicam (MOBIC) 7.5 MG tablet Take 1 tablet (7.5 mg total) by mouth daily. 30 tablet 3  . MYRBETRIQ 25 MG TB24 tablet Take 1 tablet (25 mg total) by mouth daily as needed. 90 tablet 3  . omeprazole (PRILOSEC) 20 MG capsule TAKE 2 CAPSULES BY MOUTH EVERY DAY 180 capsule 3  . Pancrelipase, Lip-Prot-Amyl, (CREON) 24000-76000 units CPEP TAKE ONE CAPSULE BY MOUTH 3 TIMES A DAY WITH FOOD 270 capsule 3  . polyethylene glycol powder (GLYCOLAX/MIRALAX) 17 GM/SCOOP powder MIX 17 GM AS DIRECTED AND DRINK TWICE DAILY AS NEEDED FOR MODERATE CONSTIPATION 527 g 5  . propranolol ER (INDERAL LA) 120 MG 24 hr capsule TAKE 1 CAPSULE BY MOUTH EVERY DAY 90 capsule 3  . RESTASIS 0.05 % ophthalmic emulsion     . topiramate (TOPAMAX) 50 MG tablet Take 50 mg by mouth daily.  1  . HYDROcodone-acetaminophen (NORCO/VICODIN) 5-325 MG tablet Take 1 tablet by mouth every 6 (six) hours as needed. 6 tablet 0   Facility-Administered Medications Prior to Visit  Medication Dose Route Frequency Provider Last Rate Last Admin  . 0.9 %  sodium chloride infusion  500 mL Intravenous Continuous Milus Banister, MD        ROS: Review of Systems  Constitutional: Positive  for unexpected weight change. Negative for activity change, appetite change, chills and fatigue.  HENT: Negative for congestion, mouth sores and sinus pressure.   Eyes: Negative for visual disturbance.  Respiratory: Negative for cough and chest tightness.   Gastrointestinal: Positive for abdominal pain and nausea.  Genitourinary: Negative for difficulty urinating, frequency and vaginal pain.  Musculoskeletal: Negative for back pain and gait problem.  Skin: Negative for pallor and rash.  Neurological: Negative for dizziness, tremors, weakness, numbness and headaches.  Psychiatric/Behavioral: Negative for confusion and sleep disturbance.    Objective:  BP (!) 144/72 (BP Location: Left Arm,  Patient Position: Sitting, Cuff Size: Normal)   Pulse 64   Temp 98.2 F (36.8 C) (Oral)   Ht 5\' 2"  (1.575 m)   Wt 124 lb (56.2 kg)   SpO2 97%   BMI 22.68 kg/m   BP Readings from Last 3 Encounters:  04/04/20 (!) 144/72  03/24/20 134/77  02/23/20 128/72    Wt Readings from Last 3 Encounters:  04/04/20 124 lb (56.2 kg)  03/24/20 127 lb (57.6 kg)  02/23/20 124 lb (56.2 kg)    Physical Exam Constitutional:      General: She is not in acute distress.    Appearance: She is well-developed.  HENT:     Head: Normocephalic.     Right Ear: External ear normal.     Left Ear: External ear normal.     Nose: Nose normal.  Eyes:     General:        Right eye: No discharge.        Left eye: No discharge.     Conjunctiva/sclera: Conjunctivae normal.     Pupils: Pupils are equal, round, and reactive to light.  Neck:     Thyroid: No thyromegaly.     Vascular: No JVD.     Trachea: No tracheal deviation.  Cardiovascular:     Rate and Rhythm: Normal rate and regular rhythm.     Heart sounds: Normal heart sounds.  Pulmonary:     Effort: No respiratory distress.     Breath sounds: No stridor. No wheezing.  Abdominal:     General: Bowel sounds are normal. There is no distension.     Palpations: Abdomen is soft. There is no mass.     Tenderness: There is no abdominal tenderness. There is no guarding or rebound.     Hernia: A hernia is present.  Musculoskeletal:        General: No tenderness.     Cervical back: Normal range of motion and neck supple.  Lymphadenopathy:     Cervical: No cervical adenopathy.  Skin:    Findings: No erythema or rash.  Neurological:     Cranial Nerves: No cranial nerve deficit.     Motor: No abnormal muscle tone.     Coordination: Coordination normal.     Deep Tendon Reflexes: Reflexes normal.  Psychiatric:        Behavior: Behavior normal.        Thought Content: Thought content normal.        Judgment: Judgment normal.    B ing hernia Lab  Results  Component Value Date   WBC 9.8 03/24/2020   HGB 12.9 03/24/2020   HCT 38.6 03/24/2020   PLT 265 03/24/2020   GLUCOSE 123 (H) 03/24/2020   CHOL 221 (H) 02/23/2020   TRIG 74.0 02/23/2020   HDL 68.30 02/23/2020   LDLDIRECT 143.0 03/02/2018   LDLCALC 137 (H) 02/23/2020  ALT 19 03/24/2020   AST 23 03/24/2020   NA 131 (L) 03/24/2020   K 3.8 03/24/2020   CL 97 (L) 03/24/2020   CREATININE 0.72 03/24/2020   BUN 11 03/24/2020   CO2 25 03/24/2020   TSH 1.22 02/23/2020   INR 1.0 02/23/2020   HGBA1C 5.9 09/29/2007    No results found.  Assessment & Plan:   There are no diagnoses linked to this encounter.   No orders of the defined types were placed in this encounter.    Follow-up: No follow-ups on file.  Walker Kehr, MD

## 2020-04-04 NOTE — Assessment & Plan Note (Signed)
S/p [place] Appt w/Dr Hassell Done

## 2020-04-04 NOTE — Telephone Encounter (Signed)
Pt seen today

## 2020-04-04 NOTE — Assessment & Plan Note (Signed)
Captopril

## 2020-04-04 NOTE — Assessment & Plan Note (Signed)
Lorazepam prn  Potential benefits of a long term benzodiazepines  use as well as potential risks  and complications were explained to the patient and were aknowledged.  

## 2020-04-13 ENCOUNTER — Encounter: Payer: Self-pay | Admitting: Internal Medicine

## 2020-04-24 ENCOUNTER — Ambulatory Visit: Payer: Self-pay | Admitting: General Surgery

## 2020-04-24 DIAGNOSIS — K4020 Bilateral inguinal hernia, without obstruction or gangrene, not specified as recurrent: Secondary | ICD-10-CM | POA: Diagnosis not present

## 2020-04-24 NOTE — H&P (Signed)
History of Present Illness Ralene Ok MD; 04/24/2020 2:29 PM) The patient is a 86 year old female who presents with an inguinal hernia. Patient follow back up today secondary to continued hernia issues. Patient states that she's had some continued abdominal pain. Patient states that recently she was having to call EMS secondary to incarcerated hernia. This was reduced. Patient states that she does have some pain and discomfort when walking. She is interested in having surgery. Patient was interviewed using a pacific interpreter in [place].  --------------------  Referred by: Dr. Benjie Karvonen Chief Complaint: Bilateral inguinal hernias  Patient is a 86 year old female, [place] female, with past medical history significant for hypercholesterolemia, uterine prolapse, constipation, who comes in with a history of bilateral inguinal hernias. Patient recently has been using a pessary to help with uterine prolapse. Upon the most recent removal of her pessary it was noticed that she had bilateral inguinal hernias, right greater than left.  In discussing with the patient and her daughter-in-law it appears that she has some lower abdominal pain. She states that she was unaware of the hernia prior to this.  She states had no previous abdominal surgeries.  It should be noted that her daughter-in-law translated during the duration of the visit.    Allergies (Chanel Teressa Senter, CMA; 04/24/2020 1:47 PM) No Known Drug Allergies  [02/10/2018]: Allergies Reconciled   Medication History (Chanel Teressa Senter, CMA; 04/24/2020 1:47 PM) Ezetimibe (10MG  Tablet, Oral) Active. Amitiza (24MCG Capsule, Oral) Active. LORazepam (1MG  Tablet, Oral) Active. Captopril (100MG  Tablet, Oral) Active. Dicyclomine HCl (10MG  Capsule, Oral) Active. Creon (24000-76000UNIT Capsule DR Part, Oral) Active. Omeprazole (20MG  Capsule DR, Oral) Active. Aspirin-Dipyridamole ER (25-200MG  Capsule ER 12HR, Oral)  Active. Myrbetriq (25MG  Tablet ER 24HR, Oral) Active. Polyethylene Glycol 3350 (Oral) Active. Premarin (0.625MG /GM Cream, Vaginal) Active. Propranolol HCl ER (120MG  Capsule ER 24HR, Oral) Active. Restasis (0.05% Emulsion, Ophthalmic) Active. Topiramate (50MG  Tablet, Oral) Active. Medications Reconciled    Review of Systems Ralene Ok, MD; 04/24/2020 2:30 PM) General Not Present- Appetite Loss, Chills, Fatigue, Fever, Night Sweats, Weight Gain and Weight Loss. Skin Present- Change in Wart/Mole and Dryness. Not Present- Hives, Jaundice, New Lesions, Non-Healing Wounds, Rash and Ulcer. HEENT Present- Wears glasses/contact lenses. Not Present- Earache, Hearing Loss, Hoarseness, Nose Bleed, Oral Ulcers, Ringing in the Ears, Seasonal Allergies, Sinus Pain, Sore Throat, Visual Disturbances and Yellow Eyes. Respiratory Present- Snoring. Not Present- Bloody sputum, Chronic Cough, Difficulty Breathing and Wheezing. Breast Not Present- Breast Mass, Breast Pain, Nipple Discharge and Skin Changes. Cardiovascular Not Present- Chest Pain, Difficulty Breathing Lying Down, Leg Cramps, Palpitations, Rapid Heart Rate, Shortness of Breath and Swelling of Extremities. Gastrointestinal Present- Bloating, Excessive gas, Hemorrhoids and Rectal Pain. Not Present- Abdominal Pain, Bloody Stool, Change in Bowel Habits, Chronic diarrhea, Constipation, Difficulty Swallowing, Gets full quickly at meals, Indigestion, Nausea and Vomiting. Female Genitourinary Present- Nocturia and Painful Urination. Not Present- Frequency, Pelvic Pain and Urgency. Musculoskeletal Not Present- Back Pain, Joint Pain, Joint Stiffness, Muscle Pain, Muscle Weakness and Swelling of Extremities. Neurological Present- Headaches. Not Present- Decreased Memory, Fainting, Numbness, Seizures, Tingling, Tremor, Trouble walking and Weakness. Psychiatric Not Present- Anxiety, Bipolar, Change in Sleep Pattern, Depression, Fearful and Frequent  crying. Endocrine Not Present- Cold Intolerance, Excessive Hunger, Hair Changes, Heat Intolerance, Hot flashes and New Diabetes. Hematology Present- Blood Thinners and Easy Bruising. Not Present- Excessive bleeding, Gland problems, HIV and Persistent Infections. All other systems negative  Vitals (Chanel Nolan CMA; 04/24/2020 1:47 PM) 04/24/2020 1:47 PM Weight: 121.5 lb Height: 61in Body Surface Area:  1.53 m Body Mass Index: 22.96 kg/m  Temp.: 98.52F  Pulse: 69 (Regular)        Physical Exam Ralene Ok, MD; 04/24/2020 2:30 PM) The physical exam findings are as follows: Note: Constitutional: No acute distress, conversant, appears stated age  Eyes: Anicteric sclerae, moist conjunctiva, no lid lag  Neck: No thyromegaly, trachea midline, no cervical lymphadenopathy  Lungs: Clear to auscultation biilaterally, normal respiratory effot  Cardiovascular: regular rate & rhythm, no murmurs, no peripheal edema, pedal pulses 2+  GI: Soft, no masses or hepatosplenomegaly, non-tender to palpation  MSK: Normal gait, no clubbing cyanosis, edema  Skin: No rashes, palpation reveals normal skin turgor  Psychiatric: Appropriate judgment and insight, oriented to person, place, and time  Abdomen Inspection Hernias - Bilateral - Inguinal hernia - Reducible - Bilateral (Large right, greater than left) .    Assessment & Plan Ralene Ok MD; 04/24/2020 2:30 PM) BILATERAL INGUINAL HERNIA WITHOUT OBSTRUCTION OR GANGRENE, RECURRENCE NOT SPECIFIED (K40.20) Impression: Patient is an 86 year old female with bilateral inguinal hernias, likely direct  1. The patient will like to proceed to the operating room for laparoscopic bilateral inguinal hernia repair with mesh.  2. I discussed with the patient the signs and symptoms of incarceration and strangulation and the need to proceed to the ER should they occur.  3. I discussed with the patient the risks and benefits of the  procedure to include but not limited to: Infection, bleeding, damage to surrounding structures, possible need for further surgery, possible nerve pain, and possible recurrence. The patient was understanding and wishes to proceed.

## 2020-04-30 ENCOUNTER — Ambulatory Visit: Payer: Self-pay | Admitting: General Surgery

## 2020-05-28 ENCOUNTER — Ambulatory Visit: Payer: Medicare Other | Admitting: Internal Medicine

## 2020-05-28 ENCOUNTER — Other Ambulatory Visit (HOSPITAL_COMMUNITY)
Admission: RE | Admit: 2020-05-28 | Discharge: 2020-05-28 | Disposition: A | Discharge status: Home / Self Care | Payer: Medicare Other | Source: Ambulatory Visit | Attending: General Surgery | Admitting: General Surgery

## 2020-05-28 DIAGNOSIS — Z01812 Encounter for preprocedural laboratory examination: Secondary | ICD-10-CM | POA: Insufficient documentation

## 2020-05-28 DIAGNOSIS — Z20822 Contact with and (suspected) exposure to covid-19: Secondary | ICD-10-CM | POA: Insufficient documentation

## 2020-05-28 LAB — SARS CORONAVIRUS 2 (TAT 6-24 HRS): SARS Coronavirus 2: NEGATIVE

## 2020-05-31 ENCOUNTER — Encounter (HOSPITAL_COMMUNITY): Payer: Self-pay | Admitting: General Surgery

## 2020-05-31 ENCOUNTER — Other Ambulatory Visit: Payer: Self-pay

## 2020-05-31 NOTE — Progress Notes (Addendum)
Mrs. Sophia Santos  tested negative for Covid_6/14/2021_ and has been in quarantine since that time. Patient denies chest pain or shortness of breath.  Sophia Santos is taking Aggenox, "for headaches and my mother had a stroke, Dr Alain Marion put me on it years ago."  Sophia Santos reported that she does not have anyone to stay with her after surgery tomorrow. "My son can drive me home, but he lives out of town and has to return home to work. "It would work best for me to spend the night in the hospital."  Sophia Santos is on Aggrenox and was not given any instructions on holding it.   I called [facility] and spoke to [facility], regarding both of the above questions, she will call a PA and have them to call me back.  Claiborne Billings, PA from [facility], said she will have the office speak to Dr. Rosendo Gros, she does not know this patient.

## 2020-06-01 ENCOUNTER — Ambulatory Visit (HOSPITAL_COMMUNITY): Admission: RE | Admit: 2020-06-01 | Payer: Medicare Other | Source: Home / Self Care | Admitting: General Surgery

## 2020-06-01 HISTORY — DX: Other complications of anesthesia, initial encounter: T88.59XA

## 2020-06-01 HISTORY — DX: Headache, unspecified: R51.9

## 2020-06-01 SURGERY — REPAIR, HERNIA, INGUINAL, BILATERAL, LAPAROSCOPIC
Anesthesia: General | Laterality: Bilateral

## 2020-06-25 ENCOUNTER — Encounter: Payer: Self-pay | Admitting: Internal Medicine

## 2020-06-25 ENCOUNTER — Ambulatory Visit (INDEPENDENT_AMBULATORY_CARE_PROVIDER_SITE_OTHER): Payer: Medicare Other | Admitting: Internal Medicine

## 2020-06-25 ENCOUNTER — Other Ambulatory Visit: Payer: Self-pay

## 2020-06-25 VITALS — BP 128/72 | HR 57 | Temp 98.2°F | Ht 62.0 in | Wt 125.0 lb

## 2020-06-25 DIAGNOSIS — R9431 Abnormal electrocardiogram [ECG] [EKG]: Secondary | ICD-10-CM

## 2020-06-25 DIAGNOSIS — R011 Cardiac murmur, unspecified: Secondary | ICD-10-CM | POA: Diagnosis not present

## 2020-06-25 DIAGNOSIS — I10 Essential (primary) hypertension: Secondary | ICD-10-CM

## 2020-06-25 DIAGNOSIS — Z01818 Encounter for other preprocedural examination: Secondary | ICD-10-CM | POA: Diagnosis not present

## 2020-06-25 DIAGNOSIS — K4020 Bilateral inguinal hernia, without obstruction or gangrene, not specified as recurrent: Secondary | ICD-10-CM | POA: Diagnosis not present

## 2020-06-25 DIAGNOSIS — Z23 Encounter for immunization: Secondary | ICD-10-CM | POA: Diagnosis not present

## 2020-06-25 NOTE — Progress Notes (Signed)
Subjective:  [name], female    DOB: 07-Oct-1934  Age: 86 y.o. MRN: [id number]  CC: No chief complaint on file.   HPI   IM pre-op consult   Reason - hernia repair med clearance  Req by Dr Damaris Hippo Savoia presents for HTN, hernia, dyslipidemia f/u - all stable  Past Medical History:  Diagnosis Date   Arthritis    Blood in stool    with rectal pain    Cataract    bilateral eyes removed   Colon polyp    Complication of anesthesia    "years ago, hard to awaken, no with recent procedures."   Diverticulosis    GERD (gastroesophageal reflux disease)    Headache    HTN (hypertension)    Hyperlipidemia    Irritable bowel syndrome (IBS)    Osteoarthritis    Osteopenia    Osteoporosis    Uterine prolapse    Past Surgical History:  Procedure Laterality Date   bilerateral carteracts removed     COLONOSCOPY     POLYPECTOMY     uterine prolapse surgery      reports that she has never smoked. She has never used smokeless tobacco. She reports that she does not drink alcohol and does not use drugs. family history includes Colon cancer (age of onset: 1) in her mother; Coronary artery disease in an other family member; Dementia in her father; Parkinson's disease in her father; Stroke in her mother. Allergies  Allergen Reactions   Alendronate Sodium     REACTION: pains   Fosamax [Alendronate Sodium]    Lipitor [Atorvastatin]     arthralgias   Simvastatin     REACTION: cramp   Voltaren [Diclofenac Sodium]     Skin rash from gel     Outpatient Medications Prior to Visit  Medication Sig Dispense Refill   ALPRAZolam (XANAX) 1 MG tablet Take 0.5-1 tablets (0.5-1 mg total) by mouth at bedtime as needed for anxiety or sleep. 90 tablet 1   captopril (CAPOTEN) 50 MG tablet Take 1 tablet (50 mg total) by mouth 2 (two) times daily. 180 tablet 3   Cholecalciferol 1000 UNITS tablet Take 1,000 Units by mouth daily.       conjugated  estrogens (PREMARIN) vaginal cream USE VAGINALLY DAILY FOR 2 WEEKS, THEN TWICE WEEKLY FOR 8 WEEKS, THEN STOP AS DIRECTED 30 g 1   dicyclomine (BENTYL) 10 MG capsule Take 1 capsule (10 mg total) by mouth 3 (three) times daily as needed for spasms. 270 capsule 2   dipyridamole-aspirin (AGGRENOX) 200-25 MG 12hr capsule Take 1 capsule by mouth 2 (two) times daily. 180 capsule 3   ezetimibe (ZETIA) 10 MG tablet Take 1 tablet (10 mg total) by mouth daily. (Patient taking differently: Take 10 mg by mouth daily. Patient not taking , "makes my arthritis worse.") 90 tablet 3   ferrous gluconate (IRON 27) 240 (27 FE) MG tablet Take 1 tablet (240 mg total) by mouth daily. 90 tablet 1   MYRBETRIQ 25 MG TB24 tablet Take 1 tablet (25 mg total) by mouth daily as needed. (Patient taking differently: Take 25 mg by mouth daily. ) 90 tablet 3   omeprazole (PRILOSEC) 20 MG capsule TAKE 2 CAPSULES BY MOUTH EVERY DAY (Patient taking differently: Take 20 mg by mouth daily. ) 180 capsule 3   Pancrelipase, Lip-Prot-Amyl, (CREON) 24000-76000 units CPEP TAKE ONE CAPSULE BY MOUTH 3 TIMES A DAY WITH FOOD (Patient taking differently: Take 1 capsule by  mouth 3 (three) times daily after meals. TAKE ONE CAPSULE BY MOUTH 3 TIMES A DAY WITH FOOD) 270 capsule 3   propranolol ER (INDERAL LA) 120 MG 24 hr capsule TAKE 1 CAPSULE BY MOUTH EVERY DAY (Patient taking differently: Take 120 mg by mouth daily. ) 90 capsule 3   RESTASIS 0.05 % ophthalmic emulsion      topiramate (TOPAMAX) 50 MG tablet Take 50 mg by mouth at bedtime.   1   AMITIZA 24 MCG capsule TAKE ONE CAPSULE BY MOUTH TWICE A DAY AS NEEDED FOR CONSTIPATION (Patient not taking: Reported on 06/25/2020) 60 capsule 2   butalbital-acetaminophen-caffeine (FIORICET) 50-325-40 MG tablet Take 1-2 tablets by mouth 2 (two) times daily as needed for headache. (Patient not taking: Reported on 06/25/2020) 20 tablet 0   meloxicam (MOBIC) 7.5 MG tablet Take 1 tablet (7.5 mg total) by  mouth daily. (Patient not taking: Reported on 06/25/2020) 30 tablet 3   polyethylene glycol powder (GLYCOLAX/MIRALAX) 17 GM/SCOOP powder MIX 17 GM AS DIRECTED AND DRINK TWICE DAILY AS NEEDED FOR MODERATE CONSTIPATION (Patient not taking: Reported on 06/25/2020) 527 g 5   Facility-Administered Medications Prior to Visit  Medication Dose Route Frequency Provider Last Rate Last Admin   0.9 %  sodium chloride infusion  500 mL Intravenous Continuous Milus Banister, MD        ROS: Review of Systems  Constitutional: Positive for fatigue. Negative for activity change, appetite change, chills and unexpected weight change.  HENT: Negative for congestion, mouth sores and sinus pressure.   Eyes: Negative for visual disturbance.  Respiratory: Negative for cough and chest tightness.   Gastrointestinal: Negative for abdominal pain and nausea.  Genitourinary: Negative for difficulty urinating, frequency and vaginal pain.  Musculoskeletal: Negative for back pain and gait problem.  Skin: Negative for pallor and rash.  Neurological: Negative for dizziness, tremors, weakness, numbness and headaches.  Psychiatric/Behavioral: Negative for confusion and sleep disturbance.    Objective:  BP 128/72 (BP Location: Left Arm, Patient Position: Sitting, Cuff Size: Normal)    Pulse (!) 57    Temp 98.2 F (36.8 C) (Oral)    Ht 5\' 2"  (1.575 m)    Wt 125 lb (56.7 kg)    SpO2 98%    BMI 22.86 kg/m   BP Readings from Last 3 Encounters:  06/25/20 128/72  04/04/20 (!) 144/72  03/24/20 134/77    Wt Readings from Last 3 Encounters:  06/25/20 125 lb (56.7 kg)  04/04/20 124 lb (56.2 kg)  03/24/20 127 lb (57.6 kg)    Physical Exam Constitutional:      General: She is not in acute distress.    Appearance: She is well-developed.  HENT:     Head: Normocephalic.     Right Ear: External ear normal.     Left Ear: External ear normal.     Nose: Nose normal.  Eyes:     General:        Right eye: No discharge.         Left eye: No discharge.     Conjunctiva/sclera: Conjunctivae normal.     Pupils: Pupils are equal, round, and reactive to light.  Neck:     Thyroid: No thyromegaly.     Vascular: No JVD.     Trachea: No tracheal deviation.  Cardiovascular:     Rate and Rhythm: Normal rate and regular rhythm.     Heart sounds: Normal heart sounds.  Pulmonary:     Effort: No respiratory  distress.     Breath sounds: No stridor. No wheezing.  Abdominal:     General: Bowel sounds are normal. There is no distension.     Palpations: Abdomen is soft. There is no mass.     Tenderness: There is no abdominal tenderness. There is no guarding or rebound.     Hernia: A hernia is present.  Musculoskeletal:        General: No tenderness.     Cervical back: Normal range of motion and neck supple.  Lymphadenopathy:     Cervical: No cervical adenopathy.  Skin:    Findings: No erythema or rash.  Neurological:     Cranial Nerves: No cranial nerve deficit.     Motor: No abnormal muscle tone.     Coordination: Coordination normal.     Deep Tendon Reflexes: Reflexes normal.  Psychiatric:        Behavior: Behavior normal.        Thought Content: Thought content normal.        Judgment: Judgment normal.   Procedure: EKG Indication: pre-op exam Impression: S bradycardia. ?septal MI   Lab Results  Component Value Date   WBC 9.8 03/24/2020   HGB 12.9 03/24/2020   HCT 38.6 03/24/2020   PLT 265 03/24/2020   GLUCOSE 123 (H) 03/24/2020   CHOL 221 (H) 02/23/2020   TRIG 74.0 02/23/2020   HDL 68.30 02/23/2020   LDLDIRECT 143.0 03/02/2018   LDLCALC 137 (H) 02/23/2020   ALT 19 03/24/2020   AST 23 03/24/2020   NA 131 (L) 03/24/2020   K 3.8 03/24/2020   CL 97 (L) 03/24/2020   CREATININE 0.72 03/24/2020   BUN 11 03/24/2020   CO2 25 03/24/2020   TSH 1.22 02/23/2020   INR 1.0 02/23/2020   HGBA1C 5.9 09/29/2007    No results found.  Assessment & Plan:    Walker Kehr, MD

## 2020-06-25 NOTE — Assessment & Plan Note (Addendum)
The pt is medically clear for  hernia surgery if her cardiac ECHO is OK (abnormal EKG). You can follow all your standard peri-op protocols. Thank you,  ECHO was ordered

## 2020-06-25 NOTE — Assessment & Plan Note (Addendum)
Cont w/Captopril Risks associated with treatment noncompliance were discussed. Compliance was encouraged. ECHO

## 2020-06-25 NOTE — Assessment & Plan Note (Addendum)
The pt is medically clear for  hernia surgery if her cardiac ECHO is OK (abnormal EKG). You can follow all your standard peri-op protocols. Thank you,

## 2020-07-19 ENCOUNTER — Ambulatory Visit (HOSPITAL_COMMUNITY): Payer: Medicare Other | Attending: Internal Medicine

## 2020-07-19 ENCOUNTER — Other Ambulatory Visit: Payer: Self-pay

## 2020-07-19 DIAGNOSIS — R9431 Abnormal electrocardiogram [ECG] [EKG]: Secondary | ICD-10-CM | POA: Diagnosis not present

## 2020-07-19 DIAGNOSIS — Z01818 Encounter for other preprocedural examination: Secondary | ICD-10-CM | POA: Insufficient documentation

## 2020-07-19 DIAGNOSIS — R011 Cardiac murmur, unspecified: Secondary | ICD-10-CM | POA: Diagnosis not present

## 2020-07-19 LAB — ECHOCARDIOGRAM COMPLETE
Area-P 1/2: 3.53 cm2
P 1/2 time: 584 msec
S' Lateral: 1.8 cm

## 2020-07-24 ENCOUNTER — Ambulatory Visit: Payer: Medicare Other | Admitting: Internal Medicine

## 2020-07-26 DIAGNOSIS — G43019 Migraine without aura, intractable, without status migrainosus: Secondary | ICD-10-CM | POA: Diagnosis not present

## 2020-07-26 DIAGNOSIS — G43719 Chronic migraine without aura, intractable, without status migrainosus: Secondary | ICD-10-CM | POA: Diagnosis not present

## 2020-09-10 ENCOUNTER — Other Ambulatory Visit (HOSPITAL_COMMUNITY)
Admission: RE | Admit: 2020-09-10 | Discharge: 2020-09-10 | Disposition: A | Discharge status: Home / Self Care | Payer: Medicare Other | Source: Ambulatory Visit | Attending: General Surgery | Admitting: General Surgery

## 2020-09-10 ENCOUNTER — Encounter (HOSPITAL_COMMUNITY): Payer: Self-pay | Admitting: General Surgery

## 2020-09-10 ENCOUNTER — Other Ambulatory Visit: Payer: Self-pay

## 2020-09-10 DIAGNOSIS — Z01812 Encounter for preprocedural laboratory examination: Secondary | ICD-10-CM | POA: Insufficient documentation

## 2020-09-10 DIAGNOSIS — Z20822 Contact with and (suspected) exposure to covid-19: Secondary | ICD-10-CM | POA: Diagnosis not present

## 2020-09-10 LAB — SARS CORONAVIRUS 2 (TAT 6-24 HRS): SARS Coronavirus 2: NEGATIVE

## 2020-09-10 NOTE — Progress Notes (Signed)
Spoke with pt via [employer] ([place]) [place], [place]. Pt denies cardiac history or Diabetes. Pt is treated for HTN.   Pt has requested to stay overnight and to have a urinary catheter. I told her that Dr. Rosendo Gros is the one that will decide if she needs this. She voiced understanding. She states she lives alone and one the second floor of the apartment building and is concerned that she couldn't climb the stairs.   Covid test done today. Pt states she has been in quarantine since the test was done and will stay in quarantine until she comes to the hospital on Thursday.  Pt requested that I call her son, Vilinda Flake and give him the pre-op instructions. I left the instructions on Igor's voicemail.

## 2020-09-11 ENCOUNTER — Ambulatory Visit: Payer: Self-pay | Admitting: General Surgery

## 2020-09-13 ENCOUNTER — Observation Stay (HOSPITAL_COMMUNITY)
Admission: RE | Admit: 2020-09-13 | Discharge: 2020-09-14 | Disposition: A | Discharge status: Home / Self Care | Payer: Medicare Other | Attending: General Surgery | Admitting: General Surgery

## 2020-09-13 ENCOUNTER — Ambulatory Visit (HOSPITAL_COMMUNITY): Payer: Medicare Other | Admitting: Certified Registered Nurse Anesthetist

## 2020-09-13 ENCOUNTER — Encounter (HOSPITAL_COMMUNITY)
Admission: RE | Disposition: A | Discharge status: Home / Self Care | Payer: Self-pay | Source: Home / Self Care | Attending: General Surgery

## 2020-09-13 ENCOUNTER — Other Ambulatory Visit: Payer: Self-pay

## 2020-09-13 ENCOUNTER — Encounter (HOSPITAL_COMMUNITY): Payer: Self-pay | Admitting: General Surgery

## 2020-09-13 DIAGNOSIS — K219 Gastro-esophageal reflux disease without esophagitis: Secondary | ICD-10-CM | POA: Diagnosis not present

## 2020-09-13 DIAGNOSIS — K4020 Bilateral inguinal hernia, without obstruction or gangrene, not specified as recurrent: Principal | ICD-10-CM | POA: Insufficient documentation

## 2020-09-13 DIAGNOSIS — I10 Essential (primary) hypertension: Secondary | ICD-10-CM | POA: Diagnosis not present

## 2020-09-13 DIAGNOSIS — Z79899 Other long term (current) drug therapy: Secondary | ICD-10-CM | POA: Diagnosis not present

## 2020-09-13 DIAGNOSIS — Z9889 Other specified postprocedural states: Secondary | ICD-10-CM

## 2020-09-13 DIAGNOSIS — Z8719 Personal history of other diseases of the digestive system: Secondary | ICD-10-CM

## 2020-09-13 DIAGNOSIS — E785 Hyperlipidemia, unspecified: Secondary | ICD-10-CM | POA: Diagnosis not present

## 2020-09-13 DIAGNOSIS — K6289 Other specified diseases of anus and rectum: Secondary | ICD-10-CM

## 2020-09-13 HISTORY — PX: INGUINAL HERNIA REPAIR: SHX194

## 2020-09-13 HISTORY — DX: Calculus of gallbladder without cholecystitis without obstruction: K80.20

## 2020-09-13 LAB — BASIC METABOLIC PANEL
Anion gap: 11 (ref 5–15)
BUN: 17 mg/dL (ref 8–23)
CO2: 24 mmol/L (ref 22–32)
Calcium: 9.1 mg/dL (ref 8.9–10.3)
Chloride: 101 mmol/L (ref 98–111)
Creatinine, Ser: 0.74 mg/dL (ref 0.44–1.00)
GFR calc Af Amer: >60 mL/min (ref >60)
GFR calc non Af Amer: >60 mL/min (ref >60)
Glucose, Bld: 93 mg/dL (ref 70–99)
Potassium: 3.5 mmol/L (ref 3.5–5.1)
Sodium: 136 mmol/L (ref 135–145)

## 2020-09-13 LAB — CBC
HCT: 35.3 % — ABNORMAL LOW (ref 36.0–46.0)
Hemoglobin: 11.4 g/dL — ABNORMAL LOW (ref 12.0–15.0)
MCH: 30.7 pg (ref 26.0–34.0)
MCHC: 32.3 g/dL (ref 30.0–36.0)
MCV: 95.1 fL (ref 80.0–100.0)
Platelets: 208 10*3/uL (ref 150–400)
RBC: 3.71 MIL/uL — ABNORMAL LOW (ref 3.87–5.11)
RDW: 12.2 % (ref 11.5–15.5)
WBC: 8.8 10*3/uL (ref 4.0–10.5)
nRBC: 0 % (ref 0.0–0.2)

## 2020-09-13 SURGERY — REPAIR, HERNIA, INGUINAL, LAPAROSCOPIC
Anesthesia: General | Site: Groin | Laterality: Bilateral

## 2020-09-13 MED ORDER — LABETALOL HCL 5 MG/ML IV SOLN
5.0000 mg | INTRAVENOUS | Status: DC | PRN
  Administered 2020-09-13: 5 mg via INTRAVENOUS

## 2020-09-13 MED ORDER — PROPOFOL 10 MG/ML IV BOLUS
INTRAVENOUS | Status: DC | PRN
  Administered 2020-09-13: 90 mg via INTRAVENOUS

## 2020-09-13 MED ORDER — ROCURONIUM BROMIDE 10 MG/ML (PF) SYRINGE
PREFILLED_SYRINGE | INTRAVENOUS | Status: DC | PRN
  Administered 2020-09-13: 50 mg via INTRAVENOUS

## 2020-09-13 MED ORDER — SUGAMMADEX SODIUM 200 MG/2ML IV SOLN
INTRAVENOUS | Status: DC | PRN
  Administered 2020-09-13: 200 mg via INTRAVENOUS

## 2020-09-13 MED ORDER — PROPRANOLOL HCL ER 120 MG PO CP24
120.0000 mg | ORAL_CAPSULE | Freq: Once | ORAL | Status: DC
  Filled 2020-09-13: qty 1

## 2020-09-13 MED ORDER — ACETAMINOPHEN 160 MG/5ML PO SOLN: 325.0000 mg | Freq: Once | ORAL | Status: DC | PRN

## 2020-09-13 MED ORDER — DEXTROSE-NACL 5-0.45 % IV SOLN: INTRAVENOUS | Status: DC

## 2020-09-13 MED ORDER — ONDANSETRON HCL 4 MG/2ML IJ SOLN
INTRAMUSCULAR | Status: AC
  Filled 2020-09-13: qty 2

## 2020-09-13 MED ORDER — DEXAMETHASONE SODIUM PHOSPHATE 10 MG/ML IJ SOLN
INTRAMUSCULAR | Status: DC | PRN
  Administered 2020-09-13: 5 mg via INTRAVENOUS

## 2020-09-13 MED ORDER — EZETIMIBE 10 MG PO TABS
10.0000 mg | ORAL_TABLET | Freq: Every day | ORAL | Status: DC
  Administered 2020-09-14: 10 mg via ORAL
  Filled 2020-09-13: qty 1

## 2020-09-13 MED ORDER — VITAMIN D 25 MCG (1000 UNIT) PO TABS
1000.0000 [IU] | ORAL_TABLET | Freq: Every day | ORAL | Status: DC
  Administered 2020-09-14: 1000 [IU] via ORAL
  Filled 2020-09-13: qty 1

## 2020-09-13 MED ORDER — ONDANSETRON HCL 4 MG/2ML IJ SOLN
INTRAMUSCULAR | Status: DC | PRN
  Administered 2020-09-13: 4 mg via INTRAVENOUS

## 2020-09-13 MED ORDER — LACTATED RINGERS IV SOLN: INTRAVENOUS | Status: DC

## 2020-09-13 MED ORDER — ACETAMINOPHEN 325 MG PO TABS
352.0000 mg | ORAL_TABLET | Freq: Four times a day (QID) | ORAL | Status: DC | PRN
  Administered 2020-09-13: 325 mg via ORAL
  Filled 2020-09-13 (×2): qty 1

## 2020-09-13 MED ORDER — FENTANYL CITRATE (PF) 100 MCG/2ML IJ SOLN
INTRAMUSCULAR | Status: DC | PRN
Start: 2020-09-13 — End: 2020-09-13
  Administered 2020-09-13 (×3): 50 ug via INTRAVENOUS

## 2020-09-13 MED ORDER — LABETALOL HCL 5 MG/ML IV SOLN
INTRAVENOUS | Status: AC
  Administered 2020-09-13: 5 mg via INTRAVENOUS
  Filled 2020-09-13: qty 4

## 2020-09-13 MED ORDER — CYCLOSPORINE 0.05 % OP EMUL
1.0000 [drp] | Freq: Two times a day (BID) | OPHTHALMIC | Status: DC
  Administered 2020-09-13: 1 [drp] via OPHTHALMIC
  Filled 2020-09-13 (×2): qty 1

## 2020-09-13 MED ORDER — TRAMADOL HCL 50 MG PO TABS: 50.0000 mg | ORAL_TABLET | Freq: Four times a day (QID) | ORAL | Status: DC | PRN

## 2020-09-13 MED ORDER — FENTANYL CITRATE (PF) 100 MCG/2ML IJ SOLN
25.0000 ug | INTRAMUSCULAR | Status: DC | PRN
  Administered 2020-09-13 (×2): 25 ug via INTRAVENOUS

## 2020-09-13 MED ORDER — CHLORHEXIDINE GLUCONATE CLOTH 2 % EX PADS: 6.0000 | MEDICATED_PAD | Freq: Once | CUTANEOUS | Status: DC

## 2020-09-13 MED ORDER — MEPERIDINE HCL 25 MG/ML IJ SOLN: 6.2500 mg | INTRAMUSCULAR | Status: DC | PRN

## 2020-09-13 MED ORDER — FERROUS SULFATE 325 (65 FE) MG PO TABS
324.0000 mg | ORAL_TABLET | Freq: Every day | ORAL | Status: DC
  Administered 2020-09-14: 324 mg via ORAL
  Filled 2020-09-13: qty 1

## 2020-09-13 MED ORDER — HYDRALAZINE HCL 20 MG/ML IJ SOLN: 10.0000 mg | INTRAMUSCULAR | Status: DC | PRN

## 2020-09-13 MED ORDER — EPHEDRINE SULFATE-NACL 50-0.9 MG/10ML-% IV SOSY
PREFILLED_SYRINGE | INTRAVENOUS | Status: DC | PRN
  Administered 2020-09-13: 5 mg via INTRAVENOUS

## 2020-09-13 MED ORDER — PROPRANOLOL HCL ER 60 MG PO CP24
120.0000 mg | ORAL_CAPSULE | Freq: Every day | ORAL | Status: DC
  Administered 2020-09-14: 120 mg via ORAL
  Filled 2020-09-13: qty 1
  Filled 2020-09-13: qty 2

## 2020-09-13 MED ORDER — ONDANSETRON 4 MG PO TBDP: 4.0000 mg | ORAL_TABLET | Freq: Four times a day (QID) | ORAL | Status: DC | PRN

## 2020-09-13 MED ORDER — LIDOCAINE 2% (20 MG/ML) 5 ML SYRINGE
INTRAMUSCULAR | Status: DC | PRN
  Administered 2020-09-13: 40 mg via INTRAVENOUS

## 2020-09-13 MED ORDER — ACETAMINOPHEN 10 MG/ML IV SOLN
INTRAVENOUS | Status: AC
  Filled 2020-09-13: qty 100

## 2020-09-13 MED ORDER — CHLORHEXIDINE GLUCONATE 0.12 % MT SOLN
15.0000 mL | Freq: Once | OROMUCOSAL | Status: AC
  Administered 2020-09-13: 15 mL via OROMUCOSAL
  Filled 2020-09-13: qty 15

## 2020-09-13 MED ORDER — ACETAMINOPHEN 325 MG PO TABS: 325.0000 mg | ORAL_TABLET | Freq: Once | ORAL | Status: DC | PRN

## 2020-09-13 MED ORDER — CEFAZOLIN SODIUM-DEXTROSE 2-4 GM/100ML-% IV SOLN
2.0000 g | INTRAVENOUS | Status: AC
  Administered 2020-09-13: 2 g via INTRAVENOUS
  Filled 2020-09-13: qty 100

## 2020-09-13 MED ORDER — ALPRAZOLAM 0.5 MG PO TABS
1.0000 mg | ORAL_TABLET | Freq: Every day | ORAL | Status: DC
  Administered 2020-09-13: 1 mg via ORAL
  Filled 2020-09-13: qty 2

## 2020-09-13 MED ORDER — 0.9 % SODIUM CHLORIDE (POUR BTL) OPTIME
TOPICAL | Status: DC | PRN
  Administered 2020-09-13: 1000 mL

## 2020-09-13 MED ORDER — DEXAMETHASONE SODIUM PHOSPHATE 10 MG/ML IJ SOLN
INTRAMUSCULAR | Status: AC
  Filled 2020-09-13: qty 1

## 2020-09-13 MED ORDER — TOPIRAMATE 25 MG PO TABS
50.0000 mg | ORAL_TABLET | Freq: Every day | ORAL | Status: DC
  Administered 2020-09-13: 50 mg via ORAL
  Filled 2020-09-13: qty 2

## 2020-09-13 MED ORDER — ONDANSETRON HCL 4 MG/2ML IJ SOLN
4.0000 mg | Freq: Four times a day (QID) | INTRAMUSCULAR | Status: DC | PRN
  Administered 2020-09-13: 4 mg via INTRAVENOUS
  Filled 2020-09-13: qty 2

## 2020-09-13 MED ORDER — AMISULPRIDE (ANTIEMETIC) 5 MG/2ML IV SOLN: 10.0000 mg | Freq: Once | INTRAVENOUS | Status: DC | PRN

## 2020-09-13 MED ORDER — LIDOCAINE HCL (CARDIAC) PF 100 MG/5ML IV SOSY: PREFILLED_SYRINGE | INTRAVENOUS | Status: DC | PRN

## 2020-09-13 MED ORDER — PROPOFOL 10 MG/ML IV BOLUS
INTRAVENOUS | Status: AC
  Filled 2020-09-13: qty 20

## 2020-09-13 MED ORDER — LABETALOL HCL 5 MG/ML IV SOLN
INTRAVENOUS | Status: DC | PRN
  Administered 2020-09-13: 5 mg via INTRAVENOUS

## 2020-09-13 MED ORDER — ASPIRIN-DIPYRIDAMOLE ER 25-200 MG PO CP12: 1.0000 | ORAL_CAPSULE | Freq: Two times a day (BID) | ORAL | Status: DC

## 2020-09-13 MED ORDER — FENTANYL CITRATE (PF) 250 MCG/5ML IJ SOLN
INTRAMUSCULAR | Status: AC
  Filled 2020-09-13: qty 5

## 2020-09-13 MED ORDER — FENTANYL CITRATE (PF) 100 MCG/2ML IJ SOLN
INTRAMUSCULAR | Status: AC
  Administered 2020-09-13: 25 ug via INTRAVENOUS
  Filled 2020-09-13: qty 2

## 2020-09-13 MED ORDER — ENSURE PRE-SURGERY PO LIQD: 296.0000 mL | Freq: Once | ORAL | Status: DC

## 2020-09-13 MED ORDER — BUPIVACAINE HCL (PF) 0.25 % IJ SOLN
INTRAMUSCULAR | Status: AC
  Filled 2020-09-13: qty 30

## 2020-09-13 MED ORDER — VITAMIN A 2400 MCG (8000 UT) PO CAPS: 2400.0000 ug | ORAL_CAPSULE | Freq: Every day | ORAL | Status: DC

## 2020-09-13 MED ORDER — OMEGA 3 1000 MG PO CAPS: 1000.0000 mg | ORAL_CAPSULE | Freq: Every day | ORAL | Status: DC

## 2020-09-13 MED ORDER — BUPIVACAINE HCL (PF) 0.25 % IJ SOLN
INTRAMUSCULAR | Status: DC | PRN
  Administered 2020-09-13: 7 mL

## 2020-09-13 MED ORDER — CAPTOPRIL 25 MG PO TABS
50.0000 mg | ORAL_TABLET | Freq: Every day | ORAL | Status: DC
  Administered 2020-09-14: 50 mg via ORAL
  Filled 2020-09-13: qty 1
  Filled 2020-09-13: qty 2

## 2020-09-13 MED ORDER — SODIUM CHLORIDE 0.9 % IV SOLN
500.0000 mL | INTRAVENOUS | Status: DC
  Administered 2020-09-13: 500 mL via INTRAVENOUS

## 2020-09-13 MED ORDER — EPHEDRINE SULFATE 50 MG/ML IJ SOLN: INTRAMUSCULAR | Status: DC | PRN

## 2020-09-13 MED ORDER — ACETAMINOPHEN 10 MG/ML IV SOLN: 1000.0000 mg | Freq: Once | INTRAVENOUS | Status: DC | PRN

## 2020-09-13 MED ORDER — STERILE WATER FOR IRRIGATION IR SOLN
Status: DC | PRN
  Administered 2020-09-13: 1000 mL

## 2020-09-13 MED ORDER — GABAPENTIN 300 MG PO CAPS
300.0000 mg | ORAL_CAPSULE | ORAL | Status: AC
  Administered 2020-09-13: 300 mg via ORAL
  Filled 2020-09-13: qty 1

## 2020-09-13 MED ORDER — PANTOPRAZOLE SODIUM 40 MG PO TBEC
40.0000 mg | DELAYED_RELEASE_TABLET | Freq: Every day | ORAL | Status: DC
  Administered 2020-09-14: 40 mg via ORAL
  Filled 2020-09-13: qty 1

## 2020-09-13 MED ORDER — ORAL CARE MOUTH RINSE: 15.0000 mL | Freq: Once | OROMUCOSAL | Status: AC

## 2020-09-13 MED ORDER — ACETAMINOPHEN 500 MG PO TABS
1000.0000 mg | ORAL_TABLET | ORAL | Status: AC
  Administered 2020-09-13: 500 mg via ORAL
  Filled 2020-09-13: qty 2

## 2020-09-13 SURGICAL SUPPLY — 40 items
CANISTER SUCT 3000ML PPV (MISCELLANEOUS) IMPLANT
COVER SURGICAL LIGHT HANDLE (MISCELLANEOUS) ×3 IMPLANT
COVER WAND RF STERILE (DRAPES) IMPLANT
DECANTER SPIKE VIAL GLASS SM (MISCELLANEOUS) ×3 IMPLANT
DEFOGGER SCOPE WARMER CLEARIFY (MISCELLANEOUS) ×3 IMPLANT
DERMABOND ADVANCED (GAUZE/BANDAGES/DRESSINGS) ×2
DERMABOND ADVANCED .7 DNX12 (GAUZE/BANDAGES/DRESSINGS) ×1 IMPLANT
DISSECTOR BLUNT TIP ENDO 5MM (MISCELLANEOUS) IMPLANT
ELECT REM PT RETURN 9FT ADLT (ELECTROSURGICAL) ×3
ELECTRODE REM PT RTRN 9FT ADLT (ELECTROSURGICAL) ×1 IMPLANT
GLOVE BIO SURGEON STRL SZ7.5 (GLOVE) ×3 IMPLANT
GOWN STRL REUS W/ TWL LRG LVL3 (GOWN DISPOSABLE) ×2 IMPLANT
GOWN STRL REUS W/ TWL XL LVL3 (GOWN DISPOSABLE) ×1 IMPLANT
GOWN STRL REUS W/TWL LRG LVL3 (GOWN DISPOSABLE) ×6
GOWN STRL REUS W/TWL XL LVL3 (GOWN DISPOSABLE) ×3
KIT BASIN OR (CUSTOM PROCEDURE TRAY) ×3 IMPLANT
KIT TURNOVER KIT B (KITS) ×3 IMPLANT
MESH 3DMAX 4X6 LT LRG (Mesh General) ×3 IMPLANT
MESH 3DMAX 4X6 RT LRG (Mesh General) ×3 IMPLANT
NEEDLE INSUFFLATION 14GA 120MM (NEEDLE) ×3 IMPLANT
NS IRRIG 1000ML POUR BTL (IV SOLUTION) ×3 IMPLANT
PAD ARMBOARD 7.5X6 YLW CONV (MISCELLANEOUS) ×6 IMPLANT
RELOAD STAPLE HERNIA 4.0 BLUE (INSTRUMENTS) ×3 IMPLANT
RELOAD STAPLE HERNIA 4.8 BLK (STAPLE) IMPLANT
SCISSORS LAP 5X35 DISP (ENDOMECHANICALS) ×3 IMPLANT
SET IRRIG TUBING LAPAROSCOPIC (IRRIGATION / IRRIGATOR) IMPLANT
SET TUBE SMOKE EVAC HIGH FLOW (TUBING) ×3 IMPLANT
STAPLER HERNIA 12 8.5 360D (INSTRUMENTS) ×3 IMPLANT
SUT MNCRL AB 4-0 PS2 18 (SUTURE) ×3 IMPLANT
SUT VIC AB 1 CT1 27 (SUTURE)
SUT VIC AB 1 CT1 27XBRD ANBCTR (SUTURE) IMPLANT
SYRINGE TOOMEY DISP (SYRINGE) ×3 IMPLANT
TOWEL GREEN STERILE (TOWEL DISPOSABLE) ×3 IMPLANT
TOWEL GREEN STERILE FF (TOWEL DISPOSABLE) ×3 IMPLANT
TRAY FOLEY W/BAG SLVR 14FR (SET/KITS/TRAYS/PACK) IMPLANT
TRAY LAPAROSCOPIC MC (CUSTOM PROCEDURE TRAY) ×3 IMPLANT
TROCAR OPTICAL SHORT 5MM (TROCAR) ×3 IMPLANT
TROCAR OPTICAL SLV SHORT 5MM (TROCAR) ×3 IMPLANT
TROCAR XCEL 12X100 BLDLESS (ENDOMECHANICALS) ×3 IMPLANT
WATER STERILE IRR 1000ML POUR (IV SOLUTION) ×3 IMPLANT

## 2020-09-13 NOTE — Anesthesia Procedure Notes (Signed)
Procedure Name: Intubation Date/Time: 09/13/2020 9:25 AM Performed by: Dorthea Cove, CRNA Pre-anesthesia Checklist: Patient identified, Emergency Drugs available, Suction available and Patient being monitored Patient Re-evaluated:Patient Re-evaluated prior to induction Oxygen Delivery Method: Circle system utilized Preoxygenation: Pre-oxygenation with 100% oxygen Induction Type: IV induction Ventilation: Mask ventilation without difficulty and Oral airway inserted - appropriate to patient size Laryngoscope Size: Mac and 3 Tube type: Oral Number of attempts: 1 Airway Equipment and Method: Stylet and Oral airway Placement Confirmation: ETT inserted through vocal cords under direct vision,  positive ETCO2 and breath sounds checked- equal and bilateral Secured at: 21 cm Tube secured with: Tape Dental Injury: Teeth and Oropharynx as per pre-operative assessment

## 2020-09-13 NOTE — H&P (Signed)
History of Present Illness The patient is a 86 year old female who presents with an inguinal hernia. Patient follow back up today secondary to continued hernia issues. Patient states that she's had some continued abdominal pain. Patient states that recently she was having to call EMS secondary to incarcerated hernia. This was reduced. Patient states that she does have some pain and discomfort when walking. She is interested in having surgery. Patient was interviewed using a pacific interpreter in [place].  --------------------  Referred by: Dr. Benjie Karvonen Chief Complaint: Bilateral inguinal hernias  Patient is a 86 year old female, [place] female, with past medical history significant for hypercholesterolemia, uterine prolapse, constipation, who comes in with a history of bilateral inguinal hernias. Patient recently has been using a pessary to help with uterine prolapse. Upon the most recent removal of her pessary it was noticed that she had bilateral inguinal hernias, right greater than left.  In discussing with the patient and her daughter-in-law it appears that she has some lower abdominal pain. She states that she was unaware of the hernia prior to this.  She states had no previous abdominal surgeries.  It should be noted that her daughter-in-law translated during the duration of the visit.    Allergies No Known Drug Allergies  [02/10/2018]: Allergies Reconciled   Medication History  Ezetimibe (10MG  Tablet, Oral) Active. Amitiza (24MCG Capsule, Oral) Active. LORazepam (1MG  Tablet, Oral) Active. Captopril (100MG  Tablet, Oral) Active. Dicyclomine HCl (10MG  Capsule, Oral) Active. Creon (24000-76000UNIT Capsule DR Part, Oral) Active. Omeprazole (20MG  Capsule DR, Oral) Active. Aspirin-Dipyridamole ER (25-200MG  Capsule ER 12HR, Oral) Active. Myrbetriq (25MG  Tablet ER 24HR, Oral) Active. Polyethylene Glycol 3350 (Oral) Active. Premarin (0.625MG /GM  Cream, Vaginal) Active. Propranolol HCl ER (120MG  Capsule ER 24HR, Oral) Active. Restasis (0.05% Emulsion, Ophthalmic) Active. Topiramate (50MG  Tablet, Oral) Active. Medications Reconciled    Review of Systems  General Not Present- Appetite Loss, Chills, Fatigue, Fever, Night Sweats, Weight Gain and Weight Loss. Skin Present- Change in Wart/Mole and Dryness. Not Present- Hives, Jaundice, New Lesions, Non-Healing Wounds, Rash and Ulcer. HEENT Present- Wears glasses/contact lenses. Not Present- Earache, Hearing Loss, Hoarseness, Nose Bleed, Oral Ulcers, Ringing in the Ears, Seasonal Allergies, Sinus Pain, Sore Throat, Visual Disturbances and Yellow Eyes. Respiratory Present- Snoring. Not Present- Bloody sputum, Chronic Cough, Difficulty Breathing and Wheezing. Breast Not Present- Breast Mass, Breast Pain, Nipple Discharge and Skin Changes. Cardiovascular Not Present- Chest Pain, Difficulty Breathing Lying Down, Leg Cramps, Palpitations, Rapid Heart Rate, Shortness of Breath and Swelling of Extremities. Gastrointestinal Present- Bloating, Excessive gas, Hemorrhoids and Rectal Pain. Not Present- Abdominal Pain, Bloody Stool, Change in Bowel Habits, Chronic diarrhea, Constipation, Difficulty Swallowing, Gets full quickly at meals, Indigestion, Nausea and Vomiting. Female Genitourinary Present- Nocturia and Painful Urination. Not Present- Frequency, Pelvic Pain and Urgency. Musculoskeletal Not Present- Back Pain, Joint Pain, Joint Stiffness, Muscle Pain, Muscle Weakness and Swelling of Extremities. Neurological Present- Headaches. Not Present- Decreased Memory, Fainting, Numbness, Seizures, Tingling, Tremor, Trouble walking and Weakness. Psychiatric Not Present- Anxiety, Bipolar, Change in Sleep Pattern, Depression, Fearful and Frequent crying. Endocrine Not Present- Cold Intolerance, Excessive Hunger, Hair Changes, Heat Intolerance, Hot flashes and New Diabetes. Hematology Present- Blood  Thinners and Easy Bruising. Not Present- Excessive bleeding, Gland problems, HIV and Persistent Infections. All other systems negative  BP (!) 177/72   Pulse (!) 56   Temp 97.6 F (36.4 C) (Oral)   Resp 20   Ht 5\' 3"  (1.6 m)   Wt 56.7 kg   SpO2 99%   BMI 22.14  kg/m     Physical Exam The physical exam findings are as follows: Note: Constitutional: No acute distress, conversant, appears stated age  Eyes: Anicteric sclerae, moist conjunctiva, no lid lag  Neck: No thyromegaly, trachea midline, no cervical lymphadenopathy  Lungs: Clear to auscultation biilaterally, normal respiratory effot  Cardiovascular: regular rate & rhythm, no murmurs, no peripheal edema, pedal pulses 2+  GI: Soft, no masses or hepatosplenomegaly, non-tender to palpation  MSK: Normal gait, no clubbing cyanosis, edema  Skin: No rashes, palpation reveals normal skin turgor  Psychiatric: Appropriate judgment and insight, oriented to person, place, and time  Abdomen Inspection Hernias - Bilateral - Inguinal hernia - Reducible - Bilateral (Large right, greater than left) .    Assessment & Plan  BILATERAL INGUINAL HERNIA WITHOUT OBSTRUCTION OR GANGRENE, RECURRENCE NOT SPECIFIED (K40.20) Impression: Patient is an 86 year old female with bilateral inguinal hernias, likely direct  1. The patient will like to proceed to the operating room for laparoscopic bilateral inguinal hernia repair with mesh.  2. I discussed with the patient the signs and symptoms of incarceration and strangulation and the need to proceed to the ER should they occur.  3. I discussed with the patient the risks and benefits of the procedure to include but not limited to: Infection, bleeding, damage to surrounding structures, possible need for further surgery, possible nerve pain, and possible recurrence. The patient was understanding and wishes to proceed.

## 2020-09-13 NOTE — Anesthesia Postprocedure Evaluation (Signed)
Anesthesia Post Note  Patient: Sophia Santos  Procedure(s) Performed: LAPAROSCOPIC BILATERAL INGUINAL HERNIA REPAIR WITH MESH (Bilateral Groin)     Patient location during evaluation: PACU Anesthesia Type: General Level of consciousness: awake and alert Pain management: pain level controlled Vital Signs Assessment: post-procedure vital signs reviewed and stable Respiratory status: spontaneous breathing, nonlabored ventilation, respiratory function stable and patient connected to nasal cannula oxygen Cardiovascular status: blood pressure returned to baseline and stable Postop Assessment: no apparent nausea or vomiting Anesthetic complications: no   No complications documented.  Last Vitals:  Vitals:   09/13/20 1425 09/13/20 1447  BP: 135/82 (!) 158/88  Pulse:  (!) 54  Resp:  18  Temp:  (!) 36.3 C  SpO2: 100% 98%    Last Pain:  Vitals:   09/13/20 1425  TempSrc:   PainSc: Asleep                 Effie Berkshire

## 2020-09-13 NOTE — Discharge Instructions (Signed)
[facility] _______[facility] °INGUINAL HERNIA REPAIR: POST OP INSTRUCTIONS ° °Always review your discharge instruction sheet given to you by the facility where your surgery was performed. °IF YOU HAVE DISABILITY OR FAMILY LEAVE FORMS, YOU MUST BRING THEM TO THE OFFICE FOR PROCESSING.   °DO NOT GIVE THEM TO YOUR DOCTOR. ° °1. A  prescription for pain medication may be given to you upon discharge.  Take your pain medication as prescribed, if needed.  If narcotic pain medicine is not needed, then you may take acetaminophen (Tylenol) or ibuprofen (Advil) as needed. °2. Take your usually prescribed medications unless otherwise directed. °If you need a refill on your pain medication, please contact your pharmacy.  They will contact our office to request authorization. Prescriptions will not be filled after 5 pm or on week-ends. °3. You should follow a light diet the first 24 hours after arrival home, such as soup and crackers, etc.  Be sure to include lots of fluids daily.  Resume your normal diet the day after surgery. °4.Most patients will experience some swelling and bruising around the umbilicus or in the groin and scrotum.  Ice packs and reclining will help.  Swelling and bruising can take several days to resolve.  °6. It is common to experience some constipation if taking pain medication after surgery.  Increasing fluid intake and taking a stool softener (such as Colace) will usually help or prevent this problem from occurring.  A mild laxative (Milk of Magnesia or Miralax) should be taken according to package directions if there are no bowel movements after 48 hours. °7. Unless discharge instructions indicate otherwise, you may remove your bandages 24-48 hours after surgery, and you may shower at that time.  You may have steri-strips (small skin tapes) in place directly over the incision.  These strips should be left on the skin for 7-10 days.  If your surgeon used skin glue on the incision, you may  shower in 24 hours.  The glue will flake off over the next 2-3 weeks.  Any sutures or staples will be removed at the office during your follow-up visit. °8. ACTIVITIES:  You may resume regular (light) daily activities beginning the next day--such as daily self-care, walking, climbing stairs--gradually increasing activities as tolerated.  You may have sexual intercourse when it is comfortable.  Refrain from any heavy lifting or straining until approved by your doctor. ° °a.You may drive when you are no longer taking prescription pain medication, you can comfortably wear a seatbelt, and you can safely maneuver your car and apply brakes. °b.RETURN TO WORK:   °_____________________________________________ ° °9.You should see your doctor in the office for a follow-up appointment approximately 2-3 weeks after your surgery.  Make sure that you call for this appointment within a day or two after you arrive home to insure a convenient appointment time. °10.OTHER INSTRUCTIONS: _________________________ °   _____________________________________ ° °WHEN TO CALL YOUR DOCTOR: °1. Fever over 101.0 °2. Inability to urinate °3. Nausea and/or vomiting °4. Extreme swelling or bruising °5. Continued bleeding from incision. °6. Increased pain, redness, or drainage from the incision ° °The clinic staff is available to answer your questions during regular business hours.  Please don’t hesitate to call and ask to speak to one of the nurses for clinical concerns.  If you have a medical emergency, go to the nearest emergency room or call 911.  A surgeon from [facility] is always on call at the hospital ° ° °[address], [address], [place], [place]  [address] ? ° [address] [address], [place], [place]   [address] °[phone] ? [phone] ? FAX [phone] °Web site: [email or web address] ° °

## 2020-09-13 NOTE — Transfer of Care (Signed)
Immediate Anesthesia Transfer of Care Note  Patient: Sophia Santos  Procedure(s) Performed: LAPAROSCOPIC BILATERAL INGUINAL HERNIA REPAIR WITH MESH (Bilateral Groin)  Patient Location: PACU  Anesthesia Type:General  Level of Consciousness: awake, alert  and patient cooperative  Airway & Oxygen Therapy: Patient Spontanous Breathing  Post-op Assessment: Report given to RN and Post -op Vital signs reviewed and stable  Post vital signs: Reviewed and stable  Last Vitals:  Vitals Value Taken Time  BP    Temp    Pulse 70 09/13/20 1035  Resp 12 09/13/20 1035  SpO2 97 % 09/13/20 1035  Vitals shown include unvalidated device data.  Last Pain:  Vitals:   09/13/20 0811  TempSrc:   PainSc: 0-No pain      Patients Stated Pain Goal: 5 (89/37/37 4966)  Complications: No complications documented.

## 2020-09-13 NOTE — Op Note (Signed)
09/13/2020  10:18 AM  PATIENT:  Sophia Santos  86 y.o. female  PRE-OPERATIVE DIAGNOSIS:  BILATERAL HERNIA  POST-OPERATIVE DIAGNOSIS:  BILATERAL HERNIA, Left Direct, Right Indirect  PROCEDURE:  Procedure(s): LAPAROSCOPIC BILATERAL INGUINAL HERNIA REPAIR WITH MESH (Bilateral)  SURGEON:  Surgeon(s) and Role:    Ralene Ok, MD - Primary  ASSISTANTS: Pryor Curia, RNFA   ANESTHESIA:   local and general  EBL:  minimal   BLOOD ADMINISTERED:none  DRAINS: none   LOCAL MEDICATIONS USED:  BUPIVICAINE   SPECIMEN:  No Specimen  DISPOSITION OF SPECIMEN:  N/A  COUNTS:  YES  TOURNIQUET:  * No tourniquets in log *  DICTATION: .Dragon Dictation Findings:  The patient had bilateral hernias, right indirect, and left direct.  Indications for procedure:  The patient is a 86 year old female with bilateral inguinal hernias for several months. Patient complained of symptomatology to his bilateral inguinal areas. The patient was taken back for elective inguinal hernia repair.  Details of the procedure: The patient was taken back to the operating room. The patient was placed in supine position with bilateral SCDs in place.  The patient was prepped and draped in the usual sterile fashion.  After appropriate anitbiotics were confirmed, a time-out was confirmed and all facts were verified.  0.25% Marcaine was used to infiltrate the umbilical area. A 11-blade was used to cut down the skin and blunt dissection was used to get the anterior fashion.  The anterior fascia was incised approximately 1 cm and the muscles were retracted laterally. Blunt dissection was then used to create a space in the preperitoneal area. At this time a 10 mm camera was then introduced into the space and advanced the pubic tubercle and a 12 mm trocar was placed over this and insufflation was started.  At this time and space was created from medial to laterally the preperitoneal space.  Cooper's ligament was initially  cleaned off.  The hernia sac was identified in the indirect space. Dissection of the hernia sac  was undertaken.  The round ligament was cauterized and transected. Patient had a 3D Max Large placed.  The mesh was brought over to cover the direct and indirect hernia spaces bilaterally. This was stapled with 4.32mm staples to Cooper's ligament, and 4.66mm staples to the anterior abdominal.  We turned our attention to the right side.  At this time and space was created from medial to laterally the preperitoneal space.  Cooper's ligament was initially cleaned off.  The hernia sac was identified in the direct space that was reduced. Dissection of the peritneum.  The round ligament was cauterized and transected. Once the hernia sac was taken down to approximately the umbilicus  a 3D Max Large was placed.  The mesh was brought over to cover the direct and indirect hernia spaces bilaterally.  The mesh was brought over to cover the direct and indirect hernia spaces bilaterally.  The hernia sac was seen lying posterior to the mesh.This was stapled with 4.25mm staples to Cooper's ligament, and 4.54mm staples to the anterior abdominal. There was no staples placed laterally. The insufflation was evacuated and the peritoneum was seen posterior to the mesh. The trochars were removed. The anterior fascia was reapproximated using #1 Vicryl on a UR- 6.  The skin was reapproximated using 4-0 Monocryl subcuticular fashion and Dermabond. The patient was awakened from general anesthesia and taken to recovery in stable condition.  PLAN OF CARE: Admit for overnight observation  PATIENT DISPOSITION:  PACU - hemodynamically stable.  Delay start of Pharmacological VTE agent (>24hrs) due to surgical blood loss or risk of bleeding: not applicable  

## 2020-09-13 NOTE — Anesthesia Preprocedure Evaluation (Signed)
Anesthesia Evaluation  Patient identified by MRN, date of birth, ID band Patient awake    Reviewed: Allergy & Precautions, NPO status , Patient's Chart, lab work & pertinent test results, reviewed documented beta blocker date and time   Airway Mallampati: II  TM Distance: >3 FB Neck ROM: Full    Dental  (+) Partial Upper, Lower Dentures   Pulmonary    breath sounds clear to auscultation       Cardiovascular hypertension, Pt. on medications and Pt. on home beta blockers  Rhythm:Regular Rate:Normal     Neuro/Psych  Headaches, PSYCHIATRIC DISORDERS Anxiety    GI/Hepatic Neg liver ROS, GERD  Medicated and Controlled,  Endo/Other  negative endocrine ROS  Renal/GU negative Renal ROS     Musculoskeletal  (+) Arthritis ,   Abdominal Normal abdominal exam  (+)   Peds  Hematology negative hematology ROS (+)   Anesthesia Other Findings - HLD  Reproductive/Obstetrics                            Anesthesia Physical Anesthesia Plan  ASA: III  Anesthesia Plan: General   Post-op Pain Management:    Induction: Intravenous  PONV Risk Score and Plan: 4 or greater and Ondansetron, Dexamethasone and Treatment may vary due to age or medical condition  Airway Management Planned: Oral ETT  Additional Equipment: None  Intra-op Plan:   Post-operative Plan: Extubation in OR  Informed Consent: I have reviewed the patients History and Physical, chart, labs and discussed the procedure including the risks, benefits and alternatives for the proposed anesthesia with the patient or authorized representative who has indicated his/her understanding and acceptance.     Dental advisory given and Interpreter used for interveiw  Plan Discussed with: CRNA  Anesthesia Plan Comments: (Echo: 1. Left ventricular ejection fraction, by estimation, is 60 to 65%. The  left ventricle has normal function. The left  ventricle has no regional  wall motion abnormalities. There is mild asymmetric left ventricular  hypertrophy of the basal-septal segment.  Left ventricular diastolic parameters are consistent with Grade I  diastolic dysfunction (impaired relaxation).  2. Right ventricular systolic function is normal. The right ventricular  size is normal. There is normal pulmonary artery systolic pressure. The  estimated right ventricular systolic pressure is 63.7 mmHg.  3. The mitral valve is grossly normal. Mild mitral valve regurgitation.  No evidence of mitral stenosis.  4. The aortic valve is tricuspid. Aortic valve regurgitation is mild. No  aortic stenosis is present.  5. The inferior vena cava is normal in size with greater than 50%  respiratory variability, suggesting right atrial pressure of 3 mmHg. )       Anesthesia Quick Evaluation

## 2020-09-14 ENCOUNTER — Encounter (HOSPITAL_COMMUNITY): Payer: Self-pay | Admitting: General Surgery

## 2020-09-14 DIAGNOSIS — K4020 Bilateral inguinal hernia, without obstruction or gangrene, not specified as recurrent: Secondary | ICD-10-CM | POA: Diagnosis not present

## 2020-09-14 DIAGNOSIS — I10 Essential (primary) hypertension: Secondary | ICD-10-CM | POA: Diagnosis not present

## 2020-09-14 DIAGNOSIS — Z79899 Other long term (current) drug therapy: Secondary | ICD-10-CM | POA: Diagnosis not present

## 2020-09-14 NOTE — Progress Notes (Addendum)
Patient alert and oriented, mae's well, voiding adequate amount of urine, swallowing without difficulty, no c/o pain at time of discharge. Patient discharged home with family. discharged instructions given to patient. Patient and family stated understanding of instructions given. Patient has an appointment with Dr. Rosendo Gros

## 2020-09-14 NOTE — Discharge Summary (Signed)
Physician Discharge Summary  [name] MRN: [id number] DOB/AGE: 03-25-1934 86 y.o.  Admit date: 09/13/2020 Discharge date: 09/14/2020  Admission Diagnoses: Bilateral Inguinal hernia Discharge Diagnoses:  Active Problems:   S/P hernia repair   Discharged Condition: good  Hospital Course: patient is an 86 year old female who comes in secondary to bilateral hernias.  Patient with laparoscopic bilateral hernia repair with mesh.  Please see operative note for details.  Postop patient was admitted and started on a respiratory ago.  Patient had good pain control.  She was ambulatory well her own.  She had good tolerance of by mouth diet. Tissue removed was deemed stable for discharge and discharged home.  Consults: None  Significant Diagnostic Studies: none  Treatments: surgery: as above  Discharge Exam: Blood pressure 120/68, pulse 97, temperature 97.7 F (36.5 C), temperature source Oral, resp. rate 18, height 5\' 3"  (1.6 m), weight 56.7 kg, SpO2 98 %. General appearance: alert and cooperative GI: soft, non-tender; bowel sounds normal; no masses,  no organomegaly  Disposition:    Allergies as of 09/14/2020      Reactions   Alendronate Sodium    REACTION: pains   Lipitor [atorvastatin]    arthralgias   Simvastatin    REACTION: cramp   Voltaren [diclofenac Sodium]    Skin rash from gel      Medication List    TAKE these medications   acetaminophen 325 MG tablet Commonly known as: TYLENOL Take 352 mg by mouth every 6 (six) hours as needed for mild pain or moderate pain.   ALPRAZolam 1 MG tablet Commonly known as: XANAX Take 0.5-1 tablets (0.5-1 mg total) by mouth at bedtime as needed for anxiety or sleep. What changed:   how much to take  when to take this   Amitiza 24 MCG capsule Generic drug: lubiprostone TAKE ONE CAPSULE BY MOUTH TWICE A DAY AS NEEDED FOR CONSTIPATION   butalbital-acetaminophen-caffeine 50-325-40 MG tablet Commonly known as:  Fioricet Take 1-2 tablets by mouth 2 (two) times daily as needed for headache.   captopril 50 MG tablet Commonly known as: CAPOTEN Take 1 tablet (50 mg total) by mouth 2 (two) times daily. What changed:   when to take this  additional instructions   Cholecalciferol 25 MCG (1000 UT) tablet Take 1,000 Units by mouth daily.   dicyclomine 10 MG capsule Commonly known as: BENTYL Take 1 capsule (10 mg total) by mouth 3 (three) times daily as needed for spasms. What changed: when to take this   dipyridamole-aspirin 200-25 MG 12hr capsule Commonly known as: AGGRENOX Take 1 capsule by mouth 2 (two) times daily.   ezetimibe 10 MG tablet Commonly known as: Zetia Take 1 tablet (10 mg total) by mouth daily.   ferrous gluconate 240 (27 FE) MG tablet Commonly known as: Iron 27 Take 1 tablet (240 mg total) by mouth daily.   ferrous sulfate 324 MG Tbec Take 324 mg by mouth daily with breakfast.   meloxicam 7.5 MG tablet Commonly known as: MOBIC Take 1 tablet (7.5 mg total) by mouth daily.   Myrbetriq 25 MG Tb24 tablet Generic drug: mirabegron ER Take 1 tablet (25 mg total) by mouth daily as needed. What changed: when to take this   Omega 3 1000 MG Caps Take 1,000 mg by mouth daily.   omeprazole 20 MG capsule Commonly known as: PRILOSEC TAKE 2 CAPSULES BY MOUTH EVERY DAY What changed:   how much to take  when to take this   Pancrelipase (  Lip-Prot-Amyl) 24000-76000 units Cpep Commonly known as: Creon TAKE ONE CAPSULE BY MOUTH 3 TIMES A DAY WITH FOOD What changed:   how much to take  how to take this  when to take this  additional instructions   polyethylene glycol powder 17 GM/SCOOP powder Commonly known as: GLYCOLAX/MIRALAX MIX 17 GM AS DIRECTED AND DRINK TWICE DAILY AS NEEDED FOR MODERATE CONSTIPATION What changed:   how much to take  how to take this  when to take this  additional instructions   Premarin vaginal cream Generic drug: conjugated  estrogens USE VAGINALLY DAILY FOR 2 WEEKS, THEN TWICE WEEKLY FOR 8 WEEKS, THEN STOP AS DIRECTED What changed:   how much to take  how to take this  when to take this  reasons to take this  additional instructions   propranolol ER 120 MG 24 hr capsule Commonly known as: INDERAL LA TAKE 1 CAPSULE BY MOUTH EVERY DAY What changed: how much to take   Restasis 0.05 % ophthalmic emulsion Generic drug: cycloSPORINE Place 1 drop into both eyes 2 (two) times daily.   topiramate 50 MG tablet Commonly known as: TOPAMAX Take 50 mg by mouth at bedtime.   Vitamin A 2400 MCG (8000 UT) Caps Take 2,400 mcg by mouth daily.       Follow-up Information    Ralene Ok, MD. Schedule an appointment as soon as possible for a visit in 2 weeks.   Specialty: General Surgery Why: Post op visit Contact information: [place] [address] [place] [address] [phone]               Signed: Ralene Ok 09/14/2020, 8:28 AM

## 2020-10-04 ENCOUNTER — Ambulatory Visit (INDEPENDENT_AMBULATORY_CARE_PROVIDER_SITE_OTHER): Payer: Medicare Other | Admitting: Internal Medicine

## 2020-10-04 ENCOUNTER — Other Ambulatory Visit: Payer: Self-pay

## 2020-10-04 ENCOUNTER — Encounter: Payer: Self-pay | Admitting: Internal Medicine

## 2020-10-04 VITALS — BP 116/68 | HR 69 | Temp 98.1°F | Ht 63.0 in | Wt 121.6 lb

## 2020-10-04 DIAGNOSIS — K581 Irritable bowel syndrome with constipation: Secondary | ICD-10-CM

## 2020-10-04 DIAGNOSIS — F411 Generalized anxiety disorder: Secondary | ICD-10-CM | POA: Diagnosis not present

## 2020-10-04 DIAGNOSIS — I10 Essential (primary) hypertension: Secondary | ICD-10-CM | POA: Diagnosis not present

## 2020-10-04 DIAGNOSIS — Z23 Encounter for immunization: Secondary | ICD-10-CM | POA: Diagnosis not present

## 2020-10-04 DIAGNOSIS — R1084 Generalized abdominal pain: Secondary | ICD-10-CM

## 2020-10-04 NOTE — Assessment & Plan Note (Signed)
Chronic Captopril Risks associated with treatment noncompliance were discussed. Compliance was encouraged.

## 2020-10-04 NOTE — Progress Notes (Signed)
Subjective:  [name], female    DOB: 05-09-1934  Age: 86 y.o. MRN: [id number]  CC: Follow-up   HPI Sophia Santos presents for hernias, IBS sx's, anxiety C/o dark stools - on iron  Outpatient Medications Prior to Visit  Medication Sig Dispense Refill  . acetaminophen (TYLENOL) 325 MG tablet Take 352 mg by mouth every 6 (six) hours as needed for mild pain or moderate pain.    Marland Kitchen ALPRAZolam (XANAX) 1 MG tablet Take 0.5-1 tablets (0.5-1 mg total) by mouth at bedtime as needed for anxiety or sleep. (Patient taking differently: Take 1 mg by mouth at bedtime. ) 90 tablet 1  . AMITIZA 24 MCG capsule TAKE ONE CAPSULE BY MOUTH TWICE A DAY AS NEEDED FOR CONSTIPATION 60 capsule 2  . butalbital-acetaminophen-caffeine (FIORICET) 50-325-40 MG tablet Take 1-2 tablets by mouth 2 (two) times daily as needed for headache. 20 tablet 0  . captopril (CAPOTEN) 50 MG tablet Take 1 tablet (50 mg total) by mouth 2 (two) times daily. (Patient taking differently: Take 50 mg by mouth daily. Take additional 50 mg if needed for high Blood pressure) 180 tablet 3  . Cholecalciferol 1000 UNITS tablet Take 1,000 Units by mouth daily.      Marland Kitchen conjugated estrogens (PREMARIN) vaginal cream USE VAGINALLY DAILY FOR 2 WEEKS, THEN TWICE WEEKLY FOR 8 WEEKS, THEN STOP AS DIRECTED (Patient taking differently: Place 1 Applicatorful vaginally daily as needed (vaginal problems). ) 30 g 1  . dicyclomine (BENTYL) 10 MG capsule Take 1 capsule (10 mg total) by mouth 3 (three) times daily as needed for spasms. (Patient taking differently: Take 10 mg by mouth 3 (three) times daily before meals. ) 270 capsule 2  . dipyridamole-aspirin (AGGRENOX) 200-25 MG 12hr capsule Take 1 capsule by mouth 2 (two) times daily. 180 capsule 3  . ezetimibe (ZETIA) 10 MG tablet Take 1 tablet (10 mg total) by mouth daily. 90 tablet 3  . ferrous gluconate (IRON 27) 240 (27 FE) MG tablet Take 1 tablet (240 mg total) by mouth daily. 90 tablet 1  . ferrous  sulfate 324 MG TBEC Take 324 mg by mouth daily with breakfast.    . meloxicam (MOBIC) 7.5 MG tablet Take 1 tablet (7.5 mg total) by mouth daily. 30 tablet 3  . MYRBETRIQ 25 MG TB24 tablet Take 1 tablet (25 mg total) by mouth daily as needed. (Patient taking differently: Take 25 mg by mouth daily. ) 90 tablet 3  . Omega 3 1000 MG CAPS Take 1,000 mg by mouth daily.    Marland Kitchen omeprazole (PRILOSEC) 20 MG capsule TAKE 2 CAPSULES BY MOUTH EVERY DAY (Patient taking differently: Take 20 mg by mouth 2 (two) times daily before a meal. ) 180 capsule 3  . Pancrelipase, Lip-Prot-Amyl, (CREON) 24000-76000 units CPEP TAKE ONE CAPSULE BY MOUTH 3 TIMES A DAY WITH FOOD (Patient taking differently: Take 24,000 Units by mouth 3 (three) times daily after meals. ) 270 capsule 3  . polyethylene glycol powder (GLYCOLAX/MIRALAX) 17 GM/SCOOP powder MIX 17 GM AS DIRECTED AND DRINK TWICE DAILY AS NEEDED FOR MODERATE CONSTIPATION (Patient taking differently: Take 17 g by mouth in the morning and at bedtime. ) 527 g 5  . propranolol ER (INDERAL LA) 120 MG 24 hr capsule TAKE 1 CAPSULE BY MOUTH EVERY DAY (Patient taking differently: Take 120 mg by mouth daily. ) 90 capsule 3  . RESTASIS 0.05 % ophthalmic emulsion Place 1 drop into both eyes 2 (two) times daily.     Marland Kitchen  topiramate (TOPAMAX) 50 MG tablet Take 50 mg by mouth at bedtime.   1  . Vitamin A 2400 MCG (8000 UT) CAPS Take 2,400 mcg by mouth daily.     Facility-Administered Medications Prior to Visit  Medication Dose Route Frequency Provider Last Rate Last Admin  . 0.9 %  sodium chloride infusion  500 mL Intravenous Continuous Milus Banister, MD        ROS: Review of Systems  Constitutional: Negative for activity change, appetite change, chills, fatigue and unexpected weight change.  HENT: Negative for congestion, mouth sores and sinus pressure.   Eyes: Negative for visual disturbance.  Respiratory: Negative for cough and chest tightness.   Gastrointestinal: Positive for  abdominal pain. Negative for nausea and vomiting.  Genitourinary: Negative for difficulty urinating, frequency and vaginal pain.  Musculoskeletal: Negative for back pain and gait problem.  Skin: Negative for pallor and rash.  Neurological: Negative for dizziness, tremors, weakness, numbness and headaches.  Psychiatric/Behavioral: Negative for confusion and sleep disturbance.    Objective:  BP 116/68 (BP Location: Left Arm, Patient Position: Sitting, Cuff Size: Large)   Pulse 69   Temp 98.1 F (36.7 C) (Oral)   Ht 5\' 3"  (1.6 m)   Wt 121 lb 9.6 oz (55.2 kg)   SpO2 95%   BMI 21.54 kg/m   BP Readings from Last 3 Encounters:  10/04/20 116/68  09/14/20 120/68  06/25/20 128/72    Wt Readings from Last 3 Encounters:  10/04/20 121 lb 9.6 oz (55.2 kg)  09/13/20 125 lb (56.7 kg)  06/25/20 125 lb (56.7 kg)    Physical Exam Constitutional:      General: She is not in acute distress.    Appearance: She is well-developed.  HENT:     Head: Normocephalic.     Right Ear: External ear normal.     Left Ear: External ear normal.     Nose: Nose normal.  Eyes:     General:        Right eye: No discharge.        Left eye: No discharge.     Conjunctiva/sclera: Conjunctivae normal.     Pupils: Pupils are equal, round, and reactive to light.  Neck:     Thyroid: No thyromegaly.     Vascular: No JVD.     Trachea: No tracheal deviation.  Cardiovascular:     Rate and Rhythm: Normal rate and regular rhythm.     Heart sounds: Normal heart sounds.  Pulmonary:     Effort: No respiratory distress.     Breath sounds: No stridor. No wheezing.  Abdominal:     General: Bowel sounds are normal. There is no distension.     Palpations: Abdomen is soft. There is no mass.     Tenderness: There is no abdominal tenderness. There is no guarding or rebound.  Musculoskeletal:        General: No tenderness.     Cervical back: Normal range of motion and neck supple.  Lymphadenopathy:     Cervical: No  cervical adenopathy.  Skin:    Findings: No erythema or rash.  Neurological:     Mental Status: She is oriented to person, place, and time.     Cranial Nerves: No cranial nerve deficit.     Motor: No abnormal muscle tone.     Coordination: Coordination normal.     Deep Tendon Reflexes: Reflexes normal.  Psychiatric:        Behavior: Behavior normal.  Thought Content: Thought content normal.        Judgment: Judgment normal.    abd healing scars  Lab Results  Component Value Date   WBC 8.8 09/13/2020   HGB 11.4 (L) 09/13/2020   HCT 35.3 (L) 09/13/2020   PLT 208 09/13/2020   GLUCOSE 93 09/13/2020   CHOL 221 (H) 02/23/2020   TRIG 74.0 02/23/2020   HDL 68.30 02/23/2020   LDLDIRECT 143.0 03/02/2018   LDLCALC 137 (H) 02/23/2020   ALT 19 03/24/2020   AST 23 03/24/2020   NA 136 09/13/2020   K 3.5 09/13/2020   CL 101 09/13/2020   CREATININE 0.74 09/13/2020   BUN 17 09/13/2020   CO2 24 09/13/2020   TSH 1.22 02/23/2020   INR 1.0 02/23/2020   HGBA1C 5.9 09/29/2007    No results found.  Assessment & Plan:    Walker Kehr, MD

## 2020-10-04 NOTE — Addendum Note (Signed)
Addended by: Rolm Baptise on: 10/04/2020 04:20 PM   Modules accepted: Orders

## 2020-10-04 NOTE — Assessment & Plan Note (Addendum)
Food allergy tests Gluten free diet did not help

## 2020-10-04 NOTE — Assessment & Plan Note (Signed)
Lorazepam prn  Potential benefits of a long term benzodiazepines  use as well as potential risks  and complications were explained to the patient and were aknowledged.  

## 2020-10-08 LAB — ALLERGEN FOOD PROFILE SPECIFIC IGE
Allergen Apple, IgE: <0.1 kU/L
Allergen Corn, IgE: <0.1 kU/L
Allergen Tomato, IgE: <0.1 kU/L
Chicken IgE: <0.1 kU/L
Codfish IgE: <0.1 kU/L
Egg White IgE: <0.1 kU/L
IgE (Immunoglobulin E), Serum: 33 IU/mL (ref 6–495)
Milk IgE: <0.1 kU/L
Orange: <0.1 kU/L
Peanut IgE: <0.1 kU/L
Shrimp IgE: <0.1 kU/L
Soybean IgE: <0.1 kU/L
Tuna: <0.1 kU/L
Wheat IgE: <0.1 kU/L

## 2020-11-01 ENCOUNTER — Other Ambulatory Visit: Payer: Self-pay | Admitting: Internal Medicine

## 2020-11-06 MED ORDER — ALPRAZOLAM 1 MG PO TABS
0.5000 mg | ORAL_TABLET | Freq: Every evening | ORAL | 1 refills | Status: DC | PRN
Start: 2020-11-06 — End: 2021-02-04

## 2020-12-19 DIAGNOSIS — H0100A Unspecified blepharitis right eye, upper and lower eyelids: Secondary | ICD-10-CM | POA: Diagnosis not present

## 2020-12-19 DIAGNOSIS — Z961 Presence of intraocular lens: Secondary | ICD-10-CM | POA: Diagnosis not present

## 2020-12-19 DIAGNOSIS — H02889 Meibomian gland dysfunction of unspecified eye, unspecified eyelid: Secondary | ICD-10-CM | POA: Diagnosis not present

## 2020-12-19 DIAGNOSIS — H0100B Unspecified blepharitis left eye, upper and lower eyelids: Secondary | ICD-10-CM | POA: Diagnosis not present

## 2021-01-31 ENCOUNTER — Other Ambulatory Visit: Payer: Self-pay | Admitting: Internal Medicine

## 2021-02-04 ENCOUNTER — Ambulatory Visit (INDEPENDENT_AMBULATORY_CARE_PROVIDER_SITE_OTHER): Payer: Medicare Other | Admitting: Internal Medicine

## 2021-02-04 ENCOUNTER — Encounter: Payer: Self-pay | Admitting: Internal Medicine

## 2021-02-04 ENCOUNTER — Other Ambulatory Visit: Payer: Self-pay

## 2021-02-04 VITALS — BP 130/72 | HR 66 | Temp 98.1°F | Ht 63.0 in | Wt 127.4 lb

## 2021-02-04 DIAGNOSIS — G4700 Insomnia, unspecified: Secondary | ICD-10-CM | POA: Diagnosis not present

## 2021-02-04 DIAGNOSIS — I10 Essential (primary) hypertension: Secondary | ICD-10-CM

## 2021-02-04 DIAGNOSIS — K219 Gastro-esophageal reflux disease without esophagitis: Secondary | ICD-10-CM

## 2021-02-04 DIAGNOSIS — F411 Generalized anxiety disorder: Secondary | ICD-10-CM

## 2021-02-04 DIAGNOSIS — K581 Irritable bowel syndrome with constipation: Secondary | ICD-10-CM | POA: Diagnosis not present

## 2021-02-04 DIAGNOSIS — E785 Hyperlipidemia, unspecified: Secondary | ICD-10-CM

## 2021-02-04 MED ORDER — PROPRANOLOL HCL ER 120 MG PO CP24: 120.0000 mg | ORAL_CAPSULE | Freq: Every day | ORAL | 3 refills | Status: DC

## 2021-02-04 MED ORDER — ALPRAZOLAM 1 MG PO TABS: 0.5000 mg | ORAL_TABLET | Freq: Every evening | ORAL | 1 refills | Status: DC | PRN

## 2021-02-04 MED ORDER — OMEPRAZOLE 20 MG PO CPDR: 40.0000 mg | DELAYED_RELEASE_CAPSULE | Freq: Every day | ORAL | 3 refills | Status: DC

## 2021-02-04 NOTE — Assessment & Plan Note (Signed)
Captopril Risks associated with treatment noncompliance were discussed. Compliance was encouraged.

## 2021-02-04 NOTE — Progress Notes (Signed)
Subjective:  [name], female    DOB: [birth year]  Age: 87 y.o. MRN: [id number]  CC: Follow-up (4 month f/u)   HPI Sophia Santos presents for anxiety, HTN, dyslipidemia f/u Pt wants to do Cologuard - sister w/colon cancer 86  Outpatient Medications Prior to Visit  Medication Sig Dispense Refill  . acetaminophen (TYLENOL) 325 MG tablet Take 352 mg by mouth every 6 (six) hours as needed for mild pain or moderate pain.    Marland Kitchen ALPRAZolam (XANAX) 1 MG tablet Take 0.5-1 tablets (0.5-1 mg total) by mouth at bedtime as needed for anxiety or sleep. 90 tablet 1  . AMITIZA 24 MCG capsule TAKE ONE CAPSULE BY MOUTH TWICE A DAY AS NEEDED FOR CONSTIPATION 60 capsule 2  . butalbital-acetaminophen-caffeine (FIORICET) 50-325-40 MG tablet Take 1-2 tablets by mouth 2 (two) times Sophia as needed for headache. 20 tablet 0  . captopril (CAPOTEN) 50 MG tablet Take 1 tablet (50 mg total) by mouth 2 (two) times Sophia. (Patient taking differently: Take 50 mg by mouth Sophia. Take additional 50 mg if needed for high Blood pressure) 180 tablet 3  . Cholecalciferol 1000 UNITS tablet Take 1,000 Units by mouth Sophia.    Marland Kitchen conjugated estrogens (PREMARIN) vaginal cream USE VAGINALLY Sophia FOR 2 WEEKS, THEN TWICE WEEKLY FOR 8 WEEKS, THEN STOP AS DIRECTED (Patient taking differently: Place 1 Applicatorful vaginally Sophia as needed (vaginal problems).) 30 g 1  . dicyclomine (BENTYL) 10 MG capsule Take 1 capsule (10 mg total) by mouth 3 (three) times Sophia as needed for spasms. (Patient taking differently: Take 10 mg by mouth 3 (three) times Sophia before meals.) 270 capsule 2  . dipyridamole-aspirin (AGGRENOX) 200-25 MG 12hr capsule Take 1 capsule by mouth 2 (two) times Sophia. 180 capsule 3  . ezetimibe (ZETIA) 10 MG tablet TAKE 1 TABLET BY MOUTH EVERY DAY 90 tablet 3  . ferrous gluconate (IRON 27) 240 (27 FE) MG tablet Take 1 tablet (240 mg total) by mouth Sophia. 90 tablet 1  . ferrous sulfate 324 MG TBEC Take 324 mg  by mouth Sophia with breakfast.    . meloxicam (MOBIC) 7.5 MG tablet Take 1 tablet (7.5 mg total) by mouth Sophia. 30 tablet 3  . MYRBETRIQ 25 MG TB24 tablet Take 1 tablet (25 mg total) by mouth Sophia. 90 tablet 3  . Omega 3 1000 MG CAPS Take 1,000 mg by mouth Sophia.    Marland Kitchen omeprazole (PRILOSEC) 20 MG capsule Take 2 capsules (40 mg total) by mouth Sophia. Annual appt due in March must see provider for future refills 180 capsule 0  . Pancrelipase, Lip-Prot-Amyl, (CREON) 24000-76000 units CPEP TAKE ONE CAPSULE BY MOUTH 3 TIMES A DAY WITH FOOD 270 capsule 3  . polyethylene glycol powder (GLYCOLAX/MIRALAX) 17 GM/SCOOP powder MIX 17 GM AS DIRECTED AND DRINK TWICE Sophia AS NEEDED FOR MODERATE CONSTIPATION (Patient taking differently: Take 17 g by mouth in the morning and at bedtime.) 527 g 5  . propranolol ER (INDERAL LA) 120 MG 24 hr capsule Take 1 capsule (120 mg total) by mouth Sophia. Annual appt due in March must see provider for future refills 90 capsule 0  . RESTASIS 0.05 % ophthalmic emulsion Place 1 drop into both eyes 2 (two) times Sophia.     Marland Kitchen topiramate (TOPAMAX) 50 MG tablet Take 50 mg by mouth at bedtime.   1  . Vitamin A 2400 MCG (8000 UT) CAPS Take 2,400 mcg by mouth Sophia.    Marland Kitchen 0.9 %  sodium chloride infusion      No facility-administered medications prior to visit.    ROS: Review of Systems  Constitutional: Negative for activity change, appetite change, chills, fatigue and unexpected weight change.  HENT: Negative for congestion, mouth sores and sinus pressure.   Eyes: Negative for visual disturbance.  Respiratory: Negative for cough and chest tightness.   Gastrointestinal: Positive for abdominal distention and abdominal pain. Negative for nausea and vomiting.  Genitourinary: Negative for difficulty urinating, frequency and vaginal pain.  Musculoskeletal: Positive for arthralgias. Negative for back pain and gait problem.  Skin: Negative for pallor and rash.  Neurological: Positive for  headaches. Negative for dizziness, tremors, weakness and numbness.  Psychiatric/Behavioral: Negative for confusion and sleep disturbance.    Objective:  BP 130/72 (BP Location: Left Arm)   Pulse 66   Temp 98.1 F (36.7 C) (Oral)   Ht 5\' 3"  (1.6 m)   Wt 127 lb 6.4 oz (57.8 kg)   SpO2 97%   BMI 22.57 kg/m   BP Readings from Last 3 Encounters:  02/04/21 130/72  10/04/20 116/68  09/14/20 120/68    Wt Readings from Last 3 Encounters:  02/04/21 127 lb 6.4 oz (57.8 kg)  10/04/20 121 lb 9.6 oz (55.2 kg)  09/13/20 125 lb (56.7 kg)    Physical Exam Constitutional:      General: She is not in acute distress.    Appearance: She is well-developed.  HENT:     Head: Normocephalic.     Right Ear: External ear normal.     Left Ear: External ear normal.     Nose: Nose normal.     Mouth/Throat:     Mouth: Oropharynx is clear and moist.  Eyes:     General:        Right eye: No discharge.        Left eye: No discharge.     Conjunctiva/sclera: Conjunctivae normal.     Pupils: Pupils are equal, round, and reactive to light.  Neck:     Thyroid: No thyromegaly.     Vascular: No JVD.     Trachea: No tracheal deviation.  Cardiovascular:     Rate and Rhythm: Normal rate and regular rhythm.     Heart sounds: Normal heart sounds.  Pulmonary:     Effort: No respiratory distress.     Breath sounds: No stridor. No wheezing.  Abdominal:     General: Bowel sounds are normal. There is no distension.     Palpations: Abdomen is soft. There is no mass.     Tenderness: There is no abdominal tenderness. There is no guarding or rebound.  Musculoskeletal:        General: No tenderness or edema.     Cervical back: Normal range of motion and neck supple.  Lymphadenopathy:     Cervical: No cervical adenopathy.  Skin:    Findings: No erythema or rash.  Neurological:     Cranial Nerves: No cranial nerve deficit.     Motor: No abnormal muscle tone.     Coordination: Coordination normal.     Deep  Tendon Reflexes: Reflexes normal.  Psychiatric:        Mood and Affect: Mood and affect normal.        Behavior: Behavior normal.        Thought Content: Thought content normal.        Judgment: Judgment normal.     Lab Results  Component Value Date   WBC 8.8  09/13/2020   HGB 11.4 (L) 09/13/2020   HCT 35.3 (L) 09/13/2020   PLT 208 09/13/2020   GLUCOSE 93 09/13/2020   CHOL 221 (H) 02/23/2020   TRIG 74.0 02/23/2020   HDL 68.30 02/23/2020   LDLDIRECT 143.0 03/02/2018   LDLCALC 137 (H) 02/23/2020   ALT 19 03/24/2020   AST 23 03/24/2020   NA 136 09/13/2020   K 3.5 09/13/2020   CL 101 09/13/2020   CREATININE 0.74 09/13/2020   BUN 17 09/13/2020   CO2 24 09/13/2020   TSH 1.22 02/23/2020   INR 1.0 02/23/2020   HGBA1C 5.9 09/29/2007    No results found.  Assessment & Plan:    Walker Kehr, MD

## 2021-02-04 NOTE — Patient Instructions (Addendum)
?  Cologuard test

## 2021-02-04 NOTE — Assessment & Plan Note (Signed)
Lorazepam -d/c; on Alprazolam  Potential benefits of a long term benzodiazepines  use as well as potential risks  and complications were explained to the patient and were aknowledged.

## 2021-02-04 NOTE — Assessment & Plan Note (Signed)
Prilosec 

## 2021-02-04 NOTE — Assessment & Plan Note (Signed)
Chronic Lorazepam prn  Potential benefits of a long term benzodiazepines  use as well as potential risks  and complications were explained to the patient and were aknowledged. 

## 2021-02-04 NOTE — Assessment & Plan Note (Signed)
Chronic sx's 

## 2021-03-21 DIAGNOSIS — Z1231 Encounter for screening mammogram for malignant neoplasm of breast: Secondary | ICD-10-CM | POA: Diagnosis not present

## 2021-05-06 ENCOUNTER — Ambulatory Visit (INDEPENDENT_AMBULATORY_CARE_PROVIDER_SITE_OTHER): Payer: Medicare Other | Admitting: Internal Medicine

## 2021-05-06 ENCOUNTER — Other Ambulatory Visit: Payer: Self-pay

## 2021-05-06 ENCOUNTER — Other Ambulatory Visit: Payer: Self-pay | Admitting: Internal Medicine

## 2021-05-06 ENCOUNTER — Encounter: Payer: Self-pay | Admitting: Internal Medicine

## 2021-05-06 VITALS — BP 142/78 | HR 66 | Temp 98.0°F | Ht 63.0 in | Wt 129.4 lb

## 2021-05-06 DIAGNOSIS — E785 Hyperlipidemia, unspecified: Secondary | ICD-10-CM

## 2021-05-06 DIAGNOSIS — I10 Essential (primary) hypertension: Secondary | ICD-10-CM

## 2021-05-06 DIAGNOSIS — N952 Postmenopausal atrophic vaginitis: Secondary | ICD-10-CM

## 2021-05-06 DIAGNOSIS — F411 Generalized anxiety disorder: Secondary | ICD-10-CM

## 2021-05-06 DIAGNOSIS — G4700 Insomnia, unspecified: Secondary | ICD-10-CM | POA: Diagnosis not present

## 2021-05-06 DIAGNOSIS — R269 Unspecified abnormalities of gait and mobility: Secondary | ICD-10-CM | POA: Diagnosis not present

## 2021-05-06 DIAGNOSIS — M5442 Lumbago with sciatica, left side: Secondary | ICD-10-CM

## 2021-05-06 DIAGNOSIS — G8929 Other chronic pain: Secondary | ICD-10-CM

## 2021-05-06 DIAGNOSIS — K581 Irritable bowel syndrome with constipation: Secondary | ICD-10-CM | POA: Diagnosis not present

## 2021-05-06 DIAGNOSIS — R7989 Other specified abnormal findings of blood chemistry: Secondary | ICD-10-CM | POA: Diagnosis not present

## 2021-05-06 LAB — CBC WITH DIFFERENTIAL/PLATELET
Basophils Absolute: 0 10*3/uL (ref 0.0–0.1)
Basophils Relative: 0.7 % (ref 0.0–3.0)
Eosinophils Absolute: 0.1 10*3/uL (ref 0.0–0.7)
Eosinophils Relative: 2.3 % (ref 0.0–5.0)
HCT: 34.8 % — ABNORMAL LOW (ref 36.0–46.0)
Hemoglobin: 11.8 g/dL — ABNORMAL LOW (ref 12.0–15.0)
Lymphocytes Relative: 39.3 % (ref 12.0–46.0)
Lymphs Abs: 1.9 10*3/uL (ref 0.7–4.0)
MCHC: 33.8 g/dL (ref 30.0–36.0)
MCV: 95.1 fl (ref 78.0–100.0)
Monocytes Absolute: 0.4 10*3/uL (ref 0.1–1.0)
Monocytes Relative: 9.1 % (ref 3.0–12.0)
Neutro Abs: 2.3 10*3/uL (ref 1.4–7.7)
Neutrophils Relative %: 48.6 % (ref 43.0–77.0)
Platelets: 231 10*3/uL (ref 150.0–400.0)
RBC: 3.66 Mil/uL — ABNORMAL LOW (ref 3.87–5.11)
RDW: 13.7 % (ref 11.5–15.5)
WBC: 4.7 10*3/uL (ref 4.0–10.5)

## 2021-05-06 LAB — LIPID PANEL
Cholesterol: 238 mg/dL — ABNORMAL HIGH (ref 0–200)
HDL: 65.1 mg/dL (ref >39.00)
LDL Cholesterol: 152 mg/dL — ABNORMAL HIGH (ref 0–99)
NonHDL: 172.79
Total CHOL/HDL Ratio: 4
Triglycerides: 106 mg/dL (ref 0.0–149.0)
VLDL: 21.2 mg/dL (ref 0.0–40.0)

## 2021-05-06 LAB — COMPREHENSIVE METABOLIC PANEL
ALT: 15 U/L (ref 0–35)
AST: 17 U/L (ref 0–37)
Albumin: 4 g/dL (ref 3.5–5.2)
Alkaline Phosphatase: 75 U/L (ref 39–117)
BUN: 18 mg/dL (ref 6–23)
CO2: 28 mEq/L (ref 19–32)
Calcium: 9 mg/dL (ref 8.4–10.5)
Chloride: 104 mEq/L (ref 96–112)
Creatinine, Ser: 0.72 mg/dL (ref 0.40–1.20)
GFR: 76.39 mL/min (ref >60.00)
Glucose, Bld: 97 mg/dL (ref 70–99)
Potassium: 4.6 mEq/L (ref 3.5–5.1)
Sodium: 138 mEq/L (ref 135–145)
Total Bilirubin: 0.5 mg/dL (ref 0.2–1.2)
Total Protein: 7 g/dL (ref 6.0–8.3)

## 2021-05-06 LAB — TSH: TSH: 1.39 u[IU]/mL (ref 0.35–4.50)

## 2021-05-06 MED ORDER — PROPRANOLOL HCL ER 120 MG PO CP24: 120.0000 mg | ORAL_CAPSULE | Freq: Every day | ORAL | 3 refills | Status: DC

## 2021-05-06 MED ORDER — PREMARIN 0.625 MG/GM VA CREA: TOPICAL_CREAM | VAGINAL | 3 refills | Status: DC

## 2021-05-06 MED ORDER — ASPIRIN-DIPYRIDAMOLE ER 25-200 MG PO CP12: 1.0000 | ORAL_CAPSULE | Freq: Two times a day (BID) | ORAL | 3 refills | Status: DC

## 2021-05-06 NOTE — Assessment & Plan Note (Signed)
CBC

## 2021-05-06 NOTE — Progress Notes (Signed)
Subjective:  [name], female    DOB: January 30, 1934  Age: 87 y.o. MRN: [id number]  CC: Follow-up (3 month f/u- Pt states she was rx premarin by her gynecologist wanting MD to refill. Also need refill on her Aggrenox & propanolol)   HPI Sophia Santos presents for gait disorder - worse, LBP, OA, HAs  Outpatient Medications Prior to Visit  Medication Sig Dispense Refill  . acetaminophen (TYLENOL) 325 MG tablet Take 352 mg by mouth every 6 (six) hours as needed for mild pain or moderate pain.    Marland Kitchen ALPRAZolam (XANAX) 1 MG tablet Take 0.5-1 tablets (0.5-1 mg total) by mouth at bedtime as needed for anxiety or sleep. 90 tablet 1  . AMITIZA 24 MCG capsule TAKE ONE CAPSULE BY MOUTH TWICE A DAY AS NEEDED FOR CONSTIPATION 60 capsule 2  . butalbital-acetaminophen-caffeine (FIORICET) 50-325-40 MG tablet Take 1-2 tablets by mouth 2 (two) times daily as needed for headache. 20 tablet 0  . captopril (CAPOTEN) 50 MG tablet Take 1 tablet (50 mg total) by mouth 2 (two) times daily. (Patient taking differently: Take 50 mg by mouth daily. Take additional 50 mg if needed for high Blood pressure) 180 tablet 3  . Cholecalciferol 1000 UNITS tablet Take 1,000 Units by mouth daily.    Marland Kitchen conjugated estrogens (PREMARIN) vaginal cream USE VAGINALLY DAILY FOR 2 WEEKS, THEN TWICE WEEKLY FOR 8 WEEKS, THEN STOP AS DIRECTED (Patient taking differently: Place 1 Applicatorful vaginally daily as needed (vaginal problems).) 30 g 1  . dicyclomine (BENTYL) 10 MG capsule Take 1 capsule (10 mg total) by mouth 3 (three) times daily as needed for spasms. (Patient taking differently: Take 10 mg by mouth 3 (three) times daily before meals.) 270 capsule 2  . ezetimibe (ZETIA) 10 MG tablet TAKE 1 TABLET BY MOUTH EVERY DAY 90 tablet 3  . ferrous sulfate 324 MG TBEC Take 324 mg by mouth daily with breakfast.    . meloxicam (MOBIC) 7.5 MG tablet Take 1 tablet (7.5 mg total) by mouth daily. 30 tablet 3  . MYRBETRIQ 25 MG TB24 tablet  Take 1 tablet (25 mg total) by mouth daily. 90 tablet 3  . Omega 3 1000 MG CAPS Take 1,000 mg by mouth daily.    Marland Kitchen omeprazole (PRILOSEC) 20 MG capsule Take 2 capsules (40 mg total) by mouth daily. Annual appt due in March must see provider for future refills 180 capsule 3  . Pancrelipase, Lip-Prot-Amyl, (CREON) 24000-76000 units CPEP TAKE ONE CAPSULE BY MOUTH 3 TIMES A DAY WITH FOOD 270 capsule 3  . polyethylene glycol powder (GLYCOLAX/MIRALAX) 17 GM/SCOOP powder MIX 17 GM AS DIRECTED AND DRINK TWICE DAILY AS NEEDED FOR MODERATE CONSTIPATION (Patient taking differently: Take 17 g by mouth in the morning and at bedtime.) 527 g 5  . RESTASIS 0.05 % ophthalmic emulsion Place 1 drop into both eyes 2 (two) times daily.     Marland Kitchen topiramate (TOPAMAX) 50 MG tablet Take 50 mg by mouth at bedtime.   1  . Vitamin A 2400 MCG (8000 UT) CAPS Take 2,400 mcg by mouth daily.    Marland Kitchen dipyridamole-aspirin (AGGRENOX) 200-25 MG 12hr capsule Take 1 capsule by mouth 2 (two) times daily. 180 capsule 3  . propranolol ER (INDERAL LA) 120 MG 24 hr capsule Take 1 capsule (120 mg total) by mouth daily. Annual appt due in March must see provider for future refills 90 capsule 3  . ferrous gluconate (IRON 27) 240 (27 FE) MG tablet Take  1 tablet (240 mg total) by mouth daily. (Patient not taking: Reported on 05/06/2021) 90 tablet 1   No facility-administered medications prior to visit.    ROS: Review of Systems  Constitutional: Negative for activity change, appetite change, chills, fatigue and unexpected weight change.  HENT: Negative for congestion, mouth sores and sinus pressure.   Eyes: Negative for visual disturbance.  Respiratory: Negative for cough and chest tightness.   Gastrointestinal: Negative for abdominal pain and nausea.  Genitourinary: Negative for difficulty urinating, frequency and vaginal pain.  Musculoskeletal: Positive for arthralgias and gait problem. Negative for back pain.  Skin: Negative for pallor and rash.   Neurological: Negative for dizziness, tremors, weakness, numbness and headaches.  Psychiatric/Behavioral: Negative for confusion, sleep disturbance and suicidal ideas.    Objective:  BP (!) 142/78 (BP Location: Left Arm)   Pulse 66   Temp 98 F (36.7 C) (Oral)   Ht 5\' 3"  (1.6 m)   Wt 129 lb 6.4 oz (58.7 kg)   SpO2 98%   BMI 22.92 kg/m   BP Readings from Last 3 Encounters:  05/06/21 (!) 142/78  02/04/21 130/72  10/04/20 116/68    Wt Readings from Last 3 Encounters:  05/06/21 129 lb 6.4 oz (58.7 kg)  02/04/21 127 lb 6.4 oz (57.8 kg)  10/04/20 121 lb 9.6 oz (55.2 kg)    Physical Exam Constitutional:      General: She is not in acute distress.    Appearance: She is well-developed.  HENT:     Head: Normocephalic.     Right Ear: External ear normal.     Left Ear: External ear normal.     Nose: Nose normal.  Eyes:     General:        Right eye: No discharge.        Left eye: No discharge.     Conjunctiva/sclera: Conjunctivae normal.     Pupils: Pupils are equal, round, and reactive to light.  Neck:     Thyroid: No thyromegaly.     Vascular: No JVD.     Trachea: No tracheal deviation.  Cardiovascular:     Rate and Rhythm: Normal rate and regular rhythm.     Heart sounds: Normal heart sounds.  Pulmonary:     Effort: No respiratory distress.     Breath sounds: No stridor. No wheezing.  Abdominal:     General: Bowel sounds are normal. There is no distension.     Palpations: Abdomen is soft. There is no mass.     Tenderness: There is no abdominal tenderness. There is no guarding or rebound.  Musculoskeletal:        General: No tenderness.     Cervical back: Normal range of motion and neck supple.  Lymphadenopathy:     Cervical: No cervical adenopathy.  Skin:    Findings: No erythema or rash.  Neurological:     Cranial Nerves: No cranial nerve deficit.     Motor: No abnormal muscle tone.     Coordination: Coordination normal.     Deep Tendon Reflexes: Reflexes  normal.  Psychiatric:        Behavior: Behavior normal.        Thought Content: Thought content normal.        Judgment: Judgment normal.   LS w/pain A little ataxic   Lab Results  Component Value Date   WBC 8.8 09/13/2020   HGB 11.4 (L) 09/13/2020   HCT 35.3 (L) 09/13/2020   PLT 208  09/13/2020   GLUCOSE 93 09/13/2020   CHOL 221 (H) 02/23/2020   TRIG 74.0 02/23/2020   HDL 68.30 02/23/2020   LDLDIRECT 143.0 03/02/2018   LDLCALC 137 (H) 02/23/2020   ALT 19 03/24/2020   AST 23 03/24/2020   NA 136 09/13/2020   K 3.5 09/13/2020   CL 101 09/13/2020   CREATININE 0.74 09/13/2020   BUN 17 09/13/2020   CO2 24 09/13/2020   TSH 1.22 02/23/2020   INR 1.0 02/23/2020   HGBA1C 5.9 09/29/2007    No results found.  Assessment & Plan:   There are no diagnoses linked to this encounter.   Meds ordered this encounter  Medications  . propranolol ER (INDERAL LA) 120 MG 24 hr capsule    Sig: Take 1 capsule (120 mg total) by mouth daily.    Dispense:  90 capsule    Refill:  3  . dipyridamole-aspirin (AGGRENOX) 200-25 MG 12hr capsule    Sig: Take 1 capsule by mouth 2 (two) times daily.    Dispense:  180 capsule    Refill:  3     Follow-up: No follow-ups on file.  Walker Kehr, MD

## 2021-05-06 NOTE — Assessment & Plan Note (Addendum)
Worse Start PT Likely spinal stenosis/OA

## 2021-05-06 NOTE — Addendum Note (Signed)
Addended by: Jacobo Forest on: 05/06/2021 11:05 AM   Modules accepted: Orders

## 2021-05-06 NOTE — Assessment & Plan Note (Signed)
Lorazepam prn  Potential benefits of a long term benzodiazepines  use as well as potential risks  and complications were explained to the patient and were aknowledged.  

## 2021-05-06 NOTE — Assessment & Plan Note (Signed)
On Zetia 

## 2021-05-06 NOTE — Assessment & Plan Note (Signed)
Captopril Risks associated with treatment noncompliance were discussed. Compliance was encouraged.  A cardiac CT scan for calcium scoring offered 3/20

## 2021-05-06 NOTE — Assessment & Plan Note (Signed)
On Alprazolam 

## 2021-05-27 ENCOUNTER — Ambulatory Visit: Payer: Medicare Other

## 2021-05-27 ENCOUNTER — Other Ambulatory Visit: Payer: Self-pay

## 2021-05-27 ENCOUNTER — Ambulatory Visit: Payer: Medicare Other | Attending: Internal Medicine

## 2021-05-27 DIAGNOSIS — M6281 Muscle weakness (generalized): Secondary | ICD-10-CM | POA: Diagnosis not present

## 2021-05-27 DIAGNOSIS — R2689 Other abnormalities of gait and mobility: Secondary | ICD-10-CM

## 2021-05-27 DIAGNOSIS — R2681 Unsteadiness on feet: Secondary | ICD-10-CM

## 2021-05-27 NOTE — Patient Instructions (Addendum)
Access Code: [email or web address] URL: [email or web address] Date: 05/27/2021 Prepared by: Sharlynn Oliphant  Exercises Sit to Stand with Arms Crossed - 2 x daily - 7 x weekly - 1 sets - 5 reps Heel Toe Raises with Counter Support - 2 x daily - 7 x weekly - 2 sets - 10 reps

## 2021-05-27 NOTE — Therapy (Signed)
[facility] [address] [place], [place], [address] Phone: [phone]   Fax:  [phone]  Physical Therapy Evaluation  Patient Details  Name: Sophia Santos MRN: [id number] Date of Birth: 08-26-1934 Referring Provider (PT): Dr. Alain Marion   Encounter Date: 05/27/2021   PT End of Session - 05/27/21 1653     Visit Number 1    Number of Visits 17    Date for PT Re-Evaluation 07/29/21    Authorization Type [employer] MCD    Progress Note Due on Visit 10    PT Start Time 1615    PT Stop Time 1700    PT Time Calculation (min) 45 min    Activity Tolerance Patient tolerated treatment well    Behavior During Therapy [facility] for tasks assessed/performed             Past Medical History:  Diagnosis Date   Arthritis    Blood in stool    with rectal pain    Cancer ([place])    skin cancer -    Cataract    bilateral eyes removed   Colon polyp    Complication of anesthesia    "years ago, hard to awaken, no with recent procedures."   Diverticulosis    Gallstones    GERD (gastroesophageal reflux disease)    Headache    HTN (hypertension)    Hyperlipidemia    Irritable bowel syndrome (IBS)    Osteoarthritis    Osteopenia    Osteoporosis    Uterine prolapse     Past Surgical History:  Procedure Laterality Date   bilateral cataracts     COLONOSCOPY     INGUINAL HERNIA REPAIR Bilateral 09/13/2020   Procedure: LAPAROSCOPIC BILATERAL INGUINAL HERNIA REPAIR WITH MESH;  Surgeon: Ralene Ok, MD;  Location: [facility];  Service: General;  Laterality: Bilateral;   POLYPECTOMY     uterine prolapse surgery      There were no vitals filed for this visit.    Subjective Assessment - 05/27/21 1620     Subjective Describes feelings of heaviness in her legs, which has progressively worsened over one month.  Uses a rollator at home, drives herself and lives alone. Reports sporadic low back pain due to underlying degenerative changes.     Patient is accompained by: Interpreter    Pertinent History B knee DJD and effusion, lumbar spondylosis and scoliosis    Limitations Standing    How long can you sit comfortably? unlimited    How long can you stand comfortably? 5 min    How long can you walk comfortably? <15 min    Patient Stated Goals To walk better    Currently in Pain? Yes    Pain Score 4     Pain Location Hip    Pain Orientation Right;Left    Pain Descriptors / Indicators Aching    Pain Type Chronic pain    Pain Onset More than a month ago    Pain Frequency Intermittent    Aggravating Factors  walking    Pain Relieving Factors rest                [facility] PT Assessment - 05/27/21 0001       Assessment   Medical Diagnosis gait disorder    Referring Provider (PT) Dr. Alain Marion    Onset Date/Surgical Date 05/06/21    Prior Therapy none      Precautions   Precautions Fall      Restrictions  Weight Bearing Restrictions No      Home Social worker Private residence    Living Arrangements Alone    Available Help at Discharge Family    Type of [place] to enter    Home Layout Two level    [place] - 2 wheels;Walker - 4 wheels      Prior Function   Level of Independence Independent      AROM   Overall AROM  Within functional limits for tasks performed      Strength   Overall Strength Deficits    Strength Assessment Site Hip    Right/Left Hip Right;Left    Right Hip ABduction 3+/5    Left Hip ABduction 3+/5      Transfers   Transfers Sit to Stand;Stand to Sit    Five time sit to stand comments  15.15s      Ambulation/Gait   Ambulation/Gait Yes    Ambulation/Gait Assistance 4: Min guard    Ambulation Distance (Feet) 125 Feet    Assistive device None    Gait Pattern Step-through pattern;Trunk flexed    Ambulation Surface Level;Indoor    Gait velocity 0.54 m/s      High Level Balance   High Level Balance Comments Able to  maintain all psitions on MCTSIB for 30s                        Objective measurements completed on examination: See above findings.               PT Education - 05/27/21 1705     Education Details Discussed eval findings, rehab potential and POC and patient is in agreement    Person(s) Educated Patient    Methods Explanation    Comprehension Verbalized understanding              PT Short Term Goals - 05/27/21 1718       PT SHORT TERM GOAL #1   Title Patient to demonstarte independence in initial HEP    Baseline TBD    Time 4    Period Weeks    Status New    Target Date 07/01/21      PT SHORT TERM GOAL #2   Title Assess TUG and set appropreiate goal    Baseline TBD    Time 4    Period Weeks    Status New    Target Date 07/01/21      PT SHORT TERM GOAL #3   Title Assess DGI and set appropriate goal    Baseline TBD    Time 5    Period Weeks    Status New    Target Date 07/01/21      PT SHORT TERM GOAL #4   Title Patient to negotiate 4 steps with single rail and most appropriate pattern    Baseline 4 steps with B rails and step to pattern    Time 4    Period Weeks    Status New    Target Date 07/01/21      PT SHORT TERM GOAL #5   Title Ambulate 231ft with LRAD under S    Baseline 158ft w/o AD with CGA    Time 4    Period Weeks    Status New    Target Date 07/01/21               PT  Long Term Goals - 05/27/21 1721       PT LONG TERM GOAL #1   Title Patient I in final HEP    Baseline TBD    Time 8    Period Weeks    Status New    Target Date 07/29/21      PT LONG TERM GOAL #2   Title Assess progress towards DGI    Baseline TBD    Time 8    Period Weeks    Status New    Target Date 07/29/21      PT LONG TERM GOAL #3   Title Increase B hip abduction to 4/5    Baseline B hip abduction 4/5 sterngth B    Time 8    Period Weeks    Status New    Target Date 07/29/21      PT LONG TERM GOAL #4   Title Patient  to ambulate 534ft with LRAD across outside terrain    Baseline 154ft with CGA over level surfaces    Time 8    Period Weeks    Status New    Target Date 07/29/21      PT LONG TERM GOAL #5   Title Assess progress towards TUG    Baseline TBD    Time 8    Period Weeks    Status New    Target Date 07/29/21      Additional Long Term Goals   Additional Long Term Goals Yes      PT LONG TERM GOAL #6   Title Patient to negotiate 16 steps with most appropriate pattern and UE support    Baseline 4 steps with B rails and step to pattern    Time 8    Period Weeks    Status New    Target Date 07/29/21                    Plan - 05/27/21 1707     Clinical Impression Statement patient referred to OPPT to address gait disorder which has progressively worsened over the past month.  She uses a RW outside the home, no AD in the home.  she is able to transfer I with UE assist, static balance is functional with EO/EC positions, patient is able to ambuate w/o AD in gym with light CGA 168ft before reporting fatigue.  5x STS times and gait velocity are reduces.  LE strength deficits noted in abduction primarily    Personal Factors and Comorbidities Age;Comorbidity 1    Comorbidities OA in knees/spine    Examination-Activity Limitations Locomotion Level;Stairs;Transfers    Stability/Clinical Decision Making Stable/Uncomplicated    Clinical Decision Making Low    Rehab Potential Good    PT Frequency 2x / week    PT Duration 8 weeks    PT Treatment/Interventions ADLs/Self Care Home Management;Aquatic Therapy;DME Instruction;Gait training;Stair training;Functional mobility training;Therapeutic activities;Therapeutic exercise;Balance training;Neuromuscular re-education;Patient/family education    PT Next Visit Plan Assess gluteus medius strength, TUG time and DGI    PT Home Exercise Plan [email or web address]    Consulted and Agree with Plan of Care Patient;Other (Comment)   interpretter             Patient will benefit from skilled therapeutic intervention in order to improve the following deficits and impairments:  Abnormal gait, Difficulty walking, Decreased endurance, Decreased activity tolerance, Decreased mobility, Decreased strength  Visit Diagnosis: Unsteadiness on feet  Other abnormalities of gait and mobility  Muscle weakness (generalized)  Problem List Patient Active Problem List   Diagnosis Date Noted   S/P hernia repair 09/13/2020   Gallstones 03/13/2020   Bruising 02/23/2020   Overactive bladder 10/25/2019   Inguinal hernia 03/02/2018   Urinary tract infection 02/03/2017   Well adult exam 11/04/2016   IBS (irritable bowel syndrome) 11/04/2016   Pancreatic insufficiency 03/03/2016   Headache 12/03/2015   Elevated alkaline phosphatase level 08/03/2015   Onychomycosis 08/03/2015   Insomnia 04/02/2015   Indigestion 04/02/2015   Chest pain, atypical 12/26/2014   Pain in left hip 04/13/2014   Snoring 11/09/2012   Skin cancer 11/09/2012   Mass of ear auricle 08/09/2012   Actinic keratoses 08/09/2012   Low back pain 10/28/2011   Preop examination 10/28/2011   Uterine prolaps 10/09/2011   Atrophic vaginitis 08/29/2011   Knee pain, left 07/25/2011   Anxiety disorder 05/31/2010   Constipation 05/02/2010   Abnormal CBC 07/31/2009   Abdominal pain 08/07/2008   ABNORMAL GLUCOSE NEC 08/07/2008   ABDOMINAL XRAY, ABNORMAL 08/07/2008   CAROTID BRUIT, RIGHT 02/22/2008   Dyslipidemia 11/21/2007   OSTEOARTHRITIS 11/21/2007   OSTEOPENIA 11/21/2007   Migraines 11/15/2007   Essential hypertension 09/06/2007   GERD 09/06/2007   PALPITATIONS, HX OF 09/06/2007    Lanice Shirts PT 05/27/2021, 5:30 PM  [facility] [address] [place], [place], [address] Phone: [phone]   Fax:  [phone]  Name: Sophia Santos MRN: [id number] Date of Birth: February 27, 1934

## 2021-05-28 ENCOUNTER — Ambulatory Visit: Payer: Medicare Other

## 2021-05-28 DIAGNOSIS — M6281 Muscle weakness (generalized): Secondary | ICD-10-CM | POA: Diagnosis not present

## 2021-05-28 DIAGNOSIS — R2689 Other abnormalities of gait and mobility: Secondary | ICD-10-CM

## 2021-05-28 DIAGNOSIS — R2681 Unsteadiness on feet: Secondary | ICD-10-CM | POA: Diagnosis not present

## 2021-05-28 NOTE — Therapy (Signed)
[facility] [address] [place], [place], [address] Phone: [phone]   Fax:  [phone]  Physical Therapy Treatment  Patient Details  Name: Sophia Santos MRN: [id number] Date of Birth: 1934/09/10 Referring Provider (PT): Dr. Alain Marion   Encounter Date: 05/28/2021   PT End of Session - 05/28/21 1701     Visit Number 2    Number of Visits 17    Date for PT Re-Evaluation 07/29/21    Authorization Type [employer] MCD    Progress Note Due on Visit 10    PT Start Time 1615    PT Stop Time 1700    PT Time Calculation (min) 45 min    Activity Tolerance Patient tolerated treatment well    Behavior During Therapy WFL for tasks assessed/performed             Past Medical History:  Diagnosis Date   Arthritis    Blood in stool    with rectal pain    Cancer ([place])    skin cancer -    Cataract    bilateral eyes removed   Colon polyp    Complication of anesthesia    "years ago, hard to awaken, no with recent procedures."   Diverticulosis    Gallstones    GERD (gastroesophageal reflux disease)    Headache    HTN (hypertension)    Hyperlipidemia    Irritable bowel syndrome (IBS)    Osteoarthritis    Osteopenia    Osteoporosis    Uterine prolapse     Past Surgical History:  Procedure Laterality Date   bilateral cataracts     COLONOSCOPY     INGUINAL HERNIA REPAIR Bilateral 09/13/2020   Procedure: LAPAROSCOPIC BILATERAL INGUINAL HERNIA REPAIR WITH MESH;  Surgeon: Ralene Ok, MD;  Location: [facility];  Service: General;  Laterality: Bilateral;   POLYPECTOMY     uterine prolapse surgery      There were no vitals filed for this visit.   Subjective Assessment - 05/28/21 1619     Subjective No new c/o, no soreness from last session to report    Patient is accompained by: Interpreter    Pertinent History B knee DJD and effusion, lumbar spondylosis and scoliosis    Limitations Standing    How long can you sit  comfortably? unlimited    How long can you stand comfortably? 5 min    How long can you walk comfortably? <15 min    Patient Stated Goals To walk better    Currently in Pain? Yes    Pain Score 2     Pain Orientation Left;Right    Pain Onset More than a month ago                               [facility] Adult PT Treatment/Exercise - 05/28/21 0001       Lumbar Exercises: Seated   Other Seated Lumbar Exercises seated core exercises of latissimus pressdowns on foam roll follwed by FAQs, marching and ankle pumps, 10 reps ea. side, alternating pattern    Other Seated Lumbar Exercises seated core exercises of hip tosses, shoulder tosses, chops, Vs, all for 10 reps with 0# ball      Lumbar Exercises: Supine   Ab Set 10 reps;Limitations    AB Set Limitations curlups x10    Clam 10 reps    Clam Limitations bilaterally    Bridge 10 reps  Other Supine Lumbar Exercises marching, alt, 2x10      Knee/Hip Exercises: Supine   Short Arc Quad Sets Strengthening;Both;1 set;10 reps    Heel Slides AROM;Strengthening;Both;1 set;10 reps    Heel Slides Limitations alt. pattern                      PT Short Term Goals - 05/28/21 1649       PT SHORT TERM GOAL #1   Title Patient to demonstarte independence in initial HEP    Baseline TBD; 05/28/21 initial HEP issued    Time 4    Period Weeks    Status New    Target Date 07/01/21      PT SHORT TERM GOAL #2   Title Assess TUG and set appropreiate goal    Baseline TBD; 05/28/21 TUG score 12.28s, no goal needed    Time 4    Period Weeks    Status New    Target Date 07/01/21      PT SHORT TERM GOAL #3   Title Assess DGI and set appropriate goal    Baseline TBD    Time 5    Period Weeks    Status New    Target Date 07/01/21      PT SHORT TERM GOAL #4   Title Patient to negotiate 4 steps with single rail and most appropriate pattern    Baseline 4 steps with B rails and step to pattern    Time 4    Period Weeks     Status New    Target Date 07/01/21      PT SHORT TERM GOAL #5   Title Ambulate 228ft with LRAD under S    Baseline 144ft w/o AD with CGA    Time 4    Period Weeks    Status New    Target Date 07/01/21               PT Long Term Goals - 05/27/21 1721       PT LONG TERM GOAL #1   Title Patient I in final HEP    Baseline TBD    Time 8    Period Weeks    Status New    Target Date 07/29/21      PT LONG TERM GOAL #2   Title Assess progress towards DGI    Baseline TBD    Time 8    Period Weeks    Status New    Target Date 07/29/21      PT LONG TERM GOAL #3   Title Increase B hip abduction to 4/5    Baseline B hip abduction 4/5 sterngth B    Time 8    Period Weeks    Status New    Target Date 07/29/21      PT LONG TERM GOAL #4   Title Patient to ambulate 566ft with LRAD across outside terrain    Baseline 173ft with CGA over level surfaces    Time 8    Period Weeks    Status New    Target Date 07/29/21      PT LONG TERM GOAL #5   Title Assess progress towards TUG    Baseline TBD    Time 8    Period Weeks    Status New    Target Date 07/29/21      Additional Long Term Goals   Additional Long Term Goals --  PT LONG TERM GOAL #6   Title Patient to negotiate 16 steps with most appropriate pattern and UE support    Baseline 4 steps with B rails and step to pattern    Time 8    Period Weeks    Status New    Target Date 07/29/21      PT LONG TERM GOAL #7   Title Patient to increase gait speed to >0.40m/s    Baseline Initial gait velacity 0.10m/s    Time 8    Period Weeks    Status New    Target Date 07/29/21                   Plan - 05/28/21 1636     Clinical Impression Statement Todays session consisted of establishing a HEP for LE and core strengthenining with focus on abductors, performed exercises in supine, sit and stand with emphasis on core activitation during tasks    Personal Factors and Comorbidities Age;Comorbidity 1     Comorbidities OA in knees/spine    Examination-Activity Limitations Locomotion Level;Stairs;Transfers    Stability/Clinical Decision Making Stable/Uncomplicated    Rehab Potential Good    PT Frequency 2x / week    PT Duration 8 weeks    PT Treatment/Interventions ADLs/Self Care Home Management;Aquatic Therapy;DME Instruction;Gait training;Stair training;Functional mobility training;Therapeutic activities;Therapeutic exercise;Balance training;Neuromuscular re-education;Patient/family education    PT Next Visit Plan Assess gluteus medius strength, TUG time and DGI    PT Home Exercise Plan [email or web address]    Consulted and Agree with Plan of Care Patient;Other (Comment)   interpretter            Patient will benefit from skilled therapeutic intervention in order to improve the following deficits and impairments:  Abnormal gait, Difficulty walking, Decreased endurance, Decreased activity tolerance, Decreased mobility, Decreased strength  Visit Diagnosis: Other abnormalities of gait and mobility  Unsteadiness on feet  Muscle weakness (generalized)     Problem List Patient Active Problem List   Diagnosis Date Noted   S/P hernia repair 09/13/2020   Gallstones 03/13/2020   Bruising 02/23/2020   Overactive bladder 10/25/2019   Inguinal hernia 03/02/2018   Urinary tract infection 02/03/2017   Well adult exam 11/04/2016   IBS (irritable bowel syndrome) 11/04/2016   Pancreatic insufficiency 03/03/2016   Headache 12/03/2015   Elevated alkaline phosphatase level 08/03/2015   Onychomycosis 08/03/2015   Insomnia 04/02/2015   Indigestion 04/02/2015   Chest pain, atypical 12/26/2014   Pain in left hip 04/13/2014   Snoring 11/09/2012   Skin cancer 11/09/2012   Mass of ear auricle 08/09/2012   Actinic keratoses 08/09/2012   Low back pain 10/28/2011   Preop examination 10/28/2011   Uterine prolaps 10/09/2011   Atrophic vaginitis 08/29/2011   Knee pain, left 07/25/2011   Anxiety disorder  05/31/2010   Constipation 05/02/2010   Abnormal CBC 07/31/2009   Abdominal pain 08/07/2008   ABNORMAL GLUCOSE NEC 08/07/2008   ABDOMINAL XRAY, ABNORMAL 08/07/2008   CAROTID BRUIT, RIGHT 02/22/2008   Dyslipidemia 11/21/2007   OSTEOARTHRITIS 11/21/2007   OSTEOPENIA 11/21/2007   Migraines 11/15/2007   Essential hypertension 09/06/2007   GERD 09/06/2007   PALPITATIONS, HX OF 09/06/2007    Lanice Shirts PT 05/28/2021, 5:01 PM  [facility] [address] [place], [place], [address] Phone: [phone]   Fax:  [phone]  Name: Umeka Wrench MRN: [id number] Date of Birth: 1934-04-08

## 2021-05-28 NOTE — Patient Instructions (Signed)
Access Code: [email or web address] URL: [email or web address] Date: 05/28/2021 Prepared by: Sharlynn Oliphant  Exercises Sit to Stand with Arms Crossed - 2 x daily - 7 x weekly - 1 sets - 5 reps Heel Toe Raises with Counter Support - 2 x daily - 7 x weekly - 2 sets - 10 reps Clamshell - 2 x daily - 7 x weekly - 3 sets - 10 reps - 30s hold Supine Bridge - 2 x daily - 7 x weekly - 3 sets - 10 reps - 30s hold

## 2021-06-04 ENCOUNTER — Ambulatory Visit: Payer: Medicare Other

## 2021-06-04 ENCOUNTER — Other Ambulatory Visit: Payer: Self-pay

## 2021-06-04 DIAGNOSIS — R2681 Unsteadiness on feet: Secondary | ICD-10-CM | POA: Diagnosis not present

## 2021-06-04 DIAGNOSIS — R2689 Other abnormalities of gait and mobility: Secondary | ICD-10-CM

## 2021-06-04 DIAGNOSIS — M6281 Muscle weakness (generalized): Secondary | ICD-10-CM

## 2021-06-04 NOTE — Therapy (Signed)
[facility] [address] [place], [place], [address] Phone: [phone]   Fax:  [phone]  Physical Therapy Treatment  Patient Details  Name: Sophia Santos MRN: [id number] Date of Birth: [birth year] Referring Provider (PT): Dr. Alain Marion   Encounter Date: 06/04/2021   PT End of Session - 06/04/21 1751     Visit Number 3    Number of Visits 17    Date for PT Re-Evaluation 07/29/21    Authorization Type [employer] MCD    Progress Note Due on Visit 10    PT Start Time 1745    PT Stop Time 1830    PT Time Calculation (min) 45 min    Activity Tolerance Patient tolerated treatment well    Behavior During Therapy [facility] for tasks assessed/performed             Past Medical History:  Diagnosis Date   Arthritis    Blood in stool    with rectal pain    Cancer ([place])    skin cancer -    Cataract    bilateral eyes removed   Colon polyp    Complication of anesthesia    "years ago, hard to awaken, no with recent procedures."   Diverticulosis    Gallstones    GERD (gastroesophageal reflux disease)    Headache    HTN (hypertension)    Hyperlipidemia    Irritable bowel syndrome (IBS)    Osteoarthritis    Osteopenia    Osteoporosis    Uterine prolapse     Past Surgical History:  Procedure Laterality Date   bilateral cataracts     COLONOSCOPY     INGUINAL HERNIA REPAIR Bilateral 09/13/2020   Procedure: LAPAROSCOPIC BILATERAL INGUINAL HERNIA REPAIR WITH MESH;  Surgeon: Ralene Ok, MD;  Location: [facility] [place];  Service: General;  Laterality: Bilateral;   POLYPECTOMY     uterine prolapse surgery      There were no vitals filed for this visit.       [facility] PT Assessment - 06/04/21 0001       Dynamic Gait Index   Level Surface Normal    Change in Gait Speed Mild Impairment    Gait with Horizontal Head Turns Normal    Gait with Vertical Head Turns Normal    Gait and Pivot Turn Mild Impairment    Step Over Obstacle  Normal    Step Around Obstacles Normal    Steps Mild Impairment    Total Score 21      Timed Up and Go Test   Normal TUG (seconds) 13    TUG Comments no AD                           [facility] Adult PT Treatment/Exercise - 06/04/21 0001       Ambulation/Gait   Ambulation/Gait Yes    Ambulation/Gait Assistance 4: Min guard    Ambulation Distance (Feet) 230 Feet    Assistive device None    Gait Pattern Step-through pattern;Trunk flexed    Ambulation Surface Level;Indoor    Gait Comments holding stick behind back to promote upright posture      Lumbar Exercises: Seated   Other Seated Lumbar Exercises seated core exercises of latissimus pressdowns on foam roll follwed by FAQs, marching and ankle pumps, 10 reps ea. side, alternating pattern, 1# wts      Lumbar Exercises: Supine   Ab Set 10 reps;Limitations  AB Set Limitations curl ups 2x10    Clam 10 reps;Limitations    Clam Limitations B 2x10    Bridge 10 reps;Limitations    Bridge Limitations 2x10    Other Supine Lumbar Exercises marching, alt, 2x10                      PT Short Term Goals - 05/28/21 1649       PT SHORT TERM GOAL #1   Title Patient to demonstarte independence in initial HEP    Baseline TBD; 05/28/21 initial HEP issued    Time 4    Period Weeks    Status New    Target Date 07/01/21      PT SHORT TERM GOAL #2   Title Assess TUG and set appropreiate goal    Baseline TBD; 05/28/21 TUG score 12.28s, no goal needed    Time 4    Period Weeks    Status New    Target Date 07/01/21      PT SHORT TERM GOAL #3   Title Assess DGI and set appropriate goal    Baseline TBD    Time 5    Period Weeks    Status New    Target Date 07/01/21      PT SHORT TERM GOAL #4   Title Patient to negotiate 4 steps with single rail and most appropriate pattern    Baseline 4 steps with B rails and step to pattern    Time 4    Period Weeks    Status New    Target Date 07/01/21      PT SHORT  TERM GOAL #5   Title Ambulate 289ft with LRAD under S    Baseline 193ft w/o AD with CGA    Time 4    Period Weeks    Status New    Target Date 07/01/21               PT Long Term Goals - 05/27/21 1721       PT LONG TERM GOAL #1   Title Patient I in final HEP    Baseline TBD    Time 8    Period Weeks    Status New    Target Date 07/29/21      PT LONG TERM GOAL #2   Title Assess progress towards DGI    Baseline TBD    Time 8    Period Weeks    Status New    Target Date 07/29/21      PT LONG TERM GOAL #3   Title Increase B hip abduction to 4/5    Baseline B hip abduction 4/5 sterngth B    Time 8    Period Weeks    Status New    Target Date 07/29/21      PT LONG TERM GOAL #4   Title Patient to ambulate 574ft with LRAD across outside terrain    Baseline 136ft with CGA over level surfaces    Time 8    Period Weeks    Status New    Target Date 07/29/21      PT LONG TERM GOAL #5   Title Assess progress towards TUG    Baseline TBD    Time 8    Period Weeks    Status New    Target Date 07/29/21      Additional Long Term Goals   Additional Long Term Goals --  PT LONG TERM GOAL #6   Title Patient to negotiate 16 steps with most appropriate pattern and UE support    Baseline 4 steps with B rails and step to pattern    Time 8    Period Weeks    Status New    Target Date 07/29/21      PT LONG TERM GOAL #7   Title Patient to increase gait speed to >0.13m/s    Baseline Initial gait velacity 0.53m/s    Time 8    Period Weeks    Status New    Target Date 07/29/21                   Plan - 06/04/21 1805     Clinical Impression Statement Todays session consisted of review and correction of HEP, continued core strengthening, increased reps and resistance added as noted. Able to perform all tasks and increased workload w/o any elevated symptoms, only fatigue.    Personal Factors and Comorbidities Age;Comorbidity 1    Comorbidities OA in  knees/spine    Examination-Activity Limitations Locomotion Level;Stairs;Transfers    Stability/Clinical Decision Making Stable/Uncomplicated    Rehab Potential Good    PT Frequency 2x / week    PT Duration 8 weeks    PT Treatment/Interventions ADLs/Self Care Home Management;Aquatic Therapy;DME Instruction;Gait training;Stair training;Functional mobility training;Therapeutic activities;Therapeutic exercise;Balance training;Neuromuscular re-education;Patient/family education    PT Next Visit Plan continue core and treunk sterngthening to improve gait and posture and improve function    PT Home Exercise Plan [email or web address]    Consulted and Agree with Plan of Care Patient;Other (Comment)   interpretter            Patient will benefit from skilled therapeutic intervention in order to improve the following deficits and impairments:  Abnormal gait, Difficulty walking, Decreased endurance, Decreased activity tolerance, Decreased mobility, Decreased strength  Visit Diagnosis: Other abnormalities of gait and mobility  Muscle weakness (generalized)  Unsteadiness on feet     Problem List Patient Active Problem List   Diagnosis Date Noted   S/P hernia repair 09/13/2020   Gallstones 03/13/2020   Bruising 02/23/2020   Overactive bladder 10/25/2019   Inguinal hernia 03/02/2018   Urinary tract infection 02/03/2017   Well adult exam 11/04/2016   IBS (irritable bowel syndrome) 11/04/2016   Pancreatic insufficiency 03/03/2016   Headache 12/03/2015   Elevated alkaline phosphatase level 08/03/2015   Onychomycosis 08/03/2015   Insomnia 04/02/2015   Indigestion 04/02/2015   Chest pain, atypical 12/26/2014   Pain in left hip 04/13/2014   Snoring 11/09/2012   Skin cancer 11/09/2012   Mass of ear auricle 08/09/2012   Actinic keratoses 08/09/2012   Low back pain 10/28/2011   Preop examination 10/28/2011   Uterine prolaps 10/09/2011   Atrophic vaginitis 08/29/2011   Knee pain, left 07/25/2011    Anxiety disorder 05/31/2010   Constipation 05/02/2010   Abnormal CBC 07/31/2009   Abdominal pain 08/07/2008   ABNORMAL GLUCOSE NEC 08/07/2008   ABDOMINAL XRAY, ABNORMAL 08/07/2008   CAROTID BRUIT, RIGHT 02/22/2008   Dyslipidemia 11/21/2007   OSTEOARTHRITIS 11/21/2007   OSTEOPENIA 11/21/2007   Migraines 11/15/2007   Essential hypertension 09/06/2007   GERD 09/06/2007   PALPITATIONS, HX OF 09/06/2007    Lanice Shirts PT 06/04/2021, 6:30 PM  [facility] [address] [place], [place], [address] Phone: [phone]   Fax:  [phone]  Name: Larsen Zettel MRN: [id number] Date of Birth: 1934-03-02

## 2021-06-05 ENCOUNTER — Ambulatory Visit: Payer: Medicare Other

## 2021-06-11 ENCOUNTER — Ambulatory Visit: Payer: Medicare Other | Admitting: Physical Therapy

## 2021-06-11 ENCOUNTER — Encounter: Payer: Self-pay | Admitting: Physical Therapy

## 2021-06-11 ENCOUNTER — Other Ambulatory Visit: Payer: Self-pay

## 2021-06-11 DIAGNOSIS — R2689 Other abnormalities of gait and mobility: Secondary | ICD-10-CM | POA: Diagnosis not present

## 2021-06-11 DIAGNOSIS — R2681 Unsteadiness on feet: Secondary | ICD-10-CM | POA: Diagnosis not present

## 2021-06-11 DIAGNOSIS — M6281 Muscle weakness (generalized): Secondary | ICD-10-CM | POA: Diagnosis not present

## 2021-06-12 NOTE — Therapy (Signed)
[facility] [address] [place], [place], [address] Phone: [phone]   Fax:  [phone]  Physical Therapy Treatment  Patient Details  Name: Sophia Santos MRN: [id number] Date of Birth: 06/23/1934 Referring Provider (PT): Dr. Alain Marion   Encounter Date: 06/11/2021   PT End of Session - 06/11/21 0938     Visit Number 4    Number of Visits 17    Date for PT Re-Evaluation 07/29/21    Authorization Type [employer] MCD    Progress Note Due on Visit 10    PT Start Time 0934    PT Stop Time 1015    PT Time Calculation (min) 41 min    Activity Tolerance Patient tolerated treatment well    Behavior During Therapy [facility] for tasks assessed/performed             Past Medical History:  Diagnosis Date   Arthritis    Blood in stool    with rectal pain    Cancer ([place])    skin cancer -    Cataract    bilateral eyes removed   Colon polyp    Complication of anesthesia    "years ago, hard to awaken, no with recent procedures."   Diverticulosis    Gallstones    GERD (gastroesophageal reflux disease)    Headache    HTN (hypertension)    Hyperlipidemia    Irritable bowel syndrome (IBS)    Osteoarthritis    Osteopenia    Osteoporosis    Uterine prolapse     Past Surgical History:  Procedure Laterality Date   bilateral cataracts     COLONOSCOPY     INGUINAL HERNIA REPAIR Bilateral 09/13/2020   Procedure: LAPAROSCOPIC BILATERAL INGUINAL HERNIA REPAIR WITH MESH;  Surgeon: Ralene Ok, MD;  Location: [facility];  Service: General;  Laterality: Bilateral;   POLYPECTOMY     uterine prolapse surgery      There were no vitals filed for this visit.   Subjective Assessment - 06/11/21 0938     Subjective No new complaints. No falls or pain to report. Reports HEP is still challenging, no issues.    Patient is accompained by: Interpreter    Pertinent History B knee DJD and effusion, lumbar spondylosis and scoliosis     Limitations Standing    How long can you sit comfortably? unlimited    How long can you stand comfortably? 5 min    How long can you walk comfortably? <15 min    Patient Stated Goals To walk better    Currently in Pain? No/denies    Pain Score 0-No pain                   [facility] Adult PT Treatment/Exercise - 06/11/21 0939       Transfers   Transfers Sit to Stand;Stand to Sit    Sit to Stand 5: Supervision    Stand to Sit 5: Supervision      Ambulation/Gait   Ambulation/Gait Yes    Ambulation/Gait Assistance 4: Min guard;5: Supervision    Ambulation/Gait Assistance Details around gym with session    Assistive device None    Gait Pattern Step-through pattern;Trunk flexed    Ambulation Surface Level;Indoor      Knee/Hip Exercises: Aerobic   Nustep level 1 x 4 minute with UE/LE's with goal ~40 steps per minute for strengthening/activity tolerance. No pain or complaints after performance.      Knee/Hip Exercises: Seated  Other Seated Knee/Hip Exercises seated on green air disc with feet on 4 inch box: with 2# weight ball- UE raises to head height and back down for 10 reps, then diagonals with ball tap to mat next to pt the up toward opposite shoulder for 10 reps each side. cues to stay tall and on ex form/technique.  with red band resistance- rows for 10 reps with cues on form and technique.      Knee/Hip Exercises: Supine   Bridges AROM;Strengthening;Both;1 set;10 reps;Limitations    Bridges Limitations cues for abd bracing and form    Other Supine Knee/Hip Exercises hooklying with red band around knees- hip fall outs for 10 reps on each side, then clamshells for 10 reps. cues for slow and controlled movements                      PT Short Term Goals - 05/28/21 1649       PT SHORT TERM GOAL #1   Title Patient to demonstarte independence in initial HEP    Baseline TBD; 05/28/21 initial HEP issued    Time 4    Period Weeks    Status New    Target Date  07/01/21      PT SHORT TERM GOAL #2   Title Assess TUG and set appropreiate goal    Baseline TBD; 05/28/21 TUG score 12.28s, no goal needed    Time 4    Period Weeks    Status New    Target Date 07/01/21      PT SHORT TERM GOAL #3   Title Assess DGI and set appropriate goal    Baseline TBD    Time 5    Period Weeks    Status New    Target Date 07/01/21      PT SHORT TERM GOAL #4   Title Patient to negotiate 4 steps with single rail and most appropriate pattern    Baseline 4 steps with B rails and step to pattern    Time 4    Period Weeks    Status New    Target Date 07/01/21      PT SHORT TERM GOAL #5   Title Ambulate 278ft with LRAD under S    Baseline 173ft w/o AD with CGA    Time 4    Period Weeks    Status New    Target Date 07/01/21               PT Long Term Goals - 05/27/21 1721       PT LONG TERM GOAL #1   Title Patient I in final HEP    Baseline TBD    Time 8    Period Weeks    Status New    Target Date 07/29/21      PT LONG TERM GOAL #2   Title Assess progress towards DGI    Baseline TBD    Time 8    Period Weeks    Status New    Target Date 07/29/21      PT LONG TERM GOAL #3   Title Increase B hip abduction to 4/5    Baseline B hip abduction 4/5 sterngth B    Time 8    Period Weeks    Status New    Target Date 07/29/21      PT LONG TERM GOAL #4   Title Patient to ambulate 568ft with LRAD across outside terrain  Baseline 154ft with CGA over level surfaces    Time 8    Period Weeks    Status New    Target Date 07/29/21      PT LONG TERM GOAL #5   Title Assess progress towards TUG    Baseline TBD    Time 8    Period Weeks    Status New    Target Date 07/29/21      Additional Long Term Goals   Additional Long Term Goals --      PT LONG TERM GOAL #6   Title Patient to negotiate 16 steps with most appropriate pattern and UE support    Baseline 4 steps with B rails and step to pattern    Time 8    Period Weeks    Status  New    Target Date 07/29/21      PT LONG TERM GOAL #7   Title Patient to increase gait speed to >0.2m/s    Baseline Initial gait velacity 0.33m/s    Time 8    Period Weeks    Status New    Target Date 07/29/21                   Plan - 06/11/21 0939     Clinical Impression Statement Todays's skilled session continued to focus on strengthening with no increase in pain reported at end of session. The pt is making progress and should benefit from continued PT to progress toward unmet goals.    Personal Factors and Comorbidities Age;Comorbidity 1    Comorbidities OA in knees/spine    Examination-Activity Limitations Locomotion Level;Stairs;Transfers    Stability/Clinical Decision Making Stable/Uncomplicated    Rehab Potential Good    PT Frequency 2x / week    PT Duration 8 weeks    PT Treatment/Interventions ADLs/Self Care Home Management;Aquatic Therapy;DME Instruction;Gait training;Stair training;Functional mobility training;Therapeutic activities;Therapeutic exercise;Balance training;Neuromuscular re-education;Patient/family education    PT Next Visit Plan continue core and trunk strengthening for improved posture with gait and improved function    PT Home Exercise Plan [email or web address]    Consulted and Agree with Plan of Care Patient;Other (Comment)   interpretter            Patient will benefit from skilled therapeutic intervention in order to improve the following deficits and impairments:  Abnormal gait, Difficulty walking, Decreased endurance, Decreased activity tolerance, Decreased mobility, Decreased strength  Visit Diagnosis: Other abnormalities of gait and mobility  Muscle weakness (generalized)  Unsteadiness on feet     Problem List Patient Active Problem List   Diagnosis Date Noted   S/P hernia repair 09/13/2020   Gallstones 03/13/2020   Bruising 02/23/2020   Overactive bladder 10/25/2019   Inguinal hernia 03/02/2018   Urinary tract infection 02/03/2017    Well adult exam 11/04/2016   IBS (irritable bowel syndrome) 11/04/2016   Pancreatic insufficiency 03/03/2016   Headache 12/03/2015   Elevated alkaline phosphatase level 08/03/2015   Onychomycosis 08/03/2015   Insomnia 04/02/2015   Indigestion 04/02/2015   Chest pain, atypical 12/26/2014   Pain in left hip 04/13/2014   Snoring 11/09/2012   Skin cancer 11/09/2012   Mass of ear auricle 08/09/2012   Actinic keratoses 08/09/2012   Low back pain 10/28/2011   Preop examination 10/28/2011   Uterine prolaps 10/09/2011   Atrophic vaginitis 08/29/2011   Knee pain, left 07/25/2011   Anxiety disorder 05/31/2010   Constipation 05/02/2010   Abnormal CBC 07/31/2009   Abdominal pain 08/07/2008  ABNORMAL GLUCOSE NEC 08/07/2008   ABDOMINAL XRAY, ABNORMAL 08/07/2008   CAROTID BRUIT, RIGHT 02/22/2008   Dyslipidemia 11/21/2007   OSTEOARTHRITIS 11/21/2007   OSTEOPENIA 11/21/2007   Migraines 11/15/2007   Essential hypertension 09/06/2007   GERD 09/06/2007   PALPITATIONS, HX OF 09/06/2007    Willow Ora, PTA, [facility] [address] [place], [place] [address] [phone] 06/12/21, 4:30 PM    Name: Sophia Santos MRN: [id number] Date of Birth: 12-28-1933

## 2021-06-13 ENCOUNTER — Ambulatory Visit: Payer: Medicare Other

## 2021-06-13 ENCOUNTER — Other Ambulatory Visit: Payer: Self-pay

## 2021-06-13 DIAGNOSIS — R2681 Unsteadiness on feet: Secondary | ICD-10-CM

## 2021-06-13 DIAGNOSIS — R2689 Other abnormalities of gait and mobility: Secondary | ICD-10-CM | POA: Diagnosis not present

## 2021-06-13 DIAGNOSIS — M6281 Muscle weakness (generalized): Secondary | ICD-10-CM

## 2021-06-13 NOTE — Therapy (Signed)
[facility] [address] [place], [place], [address] Phone: [phone]   Fax:  [phone]  Physical Therapy Treatment  Patient Details  Name: Sophia Santos MRN: [id number] Date of Birth: [birth year] Referring Provider (PT): Dr. Alain Marion   Encounter Date: 06/13/2021   PT End of Session - 06/13/21 1755     Visit Number 5    Number of Visits 17    Date for PT Re-Evaluation 07/29/21    Authorization Type [employer] MCD    Progress Note Due on Visit 10    PT Start Time 1745    PT Stop Time 1830    PT Time Calculation (min) 45 min    Equipment Utilized During Treatment Gait belt    Activity Tolerance Patient tolerated treatment well    Behavior During Therapy WFL for tasks assessed/performed             Past Medical History:  Diagnosis Date   Arthritis    Blood in stool    with rectal pain    Cancer ([place])    skin cancer -    Cataract    bilateral eyes removed   Colon polyp    Complication of anesthesia    "years ago, hard to awaken, no with recent procedures."   Diverticulosis    Gallstones    GERD (gastroesophageal reflux disease)    Headache    HTN (hypertension)    Hyperlipidemia    Irritable bowel syndrome (IBS)    Osteoarthritis    Osteopenia    Osteoporosis    Uterine prolapse     Past Surgical History:  Procedure Laterality Date   bilateral cataracts     COLONOSCOPY     INGUINAL HERNIA REPAIR Bilateral 09/13/2020   Procedure: LAPAROSCOPIC BILATERAL INGUINAL HERNIA REPAIR WITH MESH;  Surgeon: Ralene Ok, MD;  Location: [facility];  Service: General;  Laterality: Bilateral;   POLYPECTOMY     uterine prolapse surgery      There were no vitals filed for this visit.   Subjective Assessment - 06/13/21 1754     Subjective Feels she is getting stronger, heat is causing her to fatigue and has not been as active, no increased soreness following last session, walking is easier    Patient is  accompained by: Interpreter    Pertinent History B knee DJD and effusion, lumbar spondylosis and scoliosis    Limitations Standing    How long can you sit comfortably? unlimited    How long can you stand comfortably? 5 min    How long can you walk comfortably? <15 min    Patient Stated Goals To walk better    Pain Type Chronic pain    Pain Onset More than a month ago                               [facility] Adult PT Treatment/Exercise - 06/13/21 0001       Transfers   Transfers Sit to Stand    Sit to Stand 5: Supervision;4: Min guard    Number of Reps 10 reps    Comments performed on [employer],2x5      Ambulation/Gait   Ambulation/Gait Yes    Ambulation/Gait Assistance 4: Min guard;5: Supervision    Ambulation Distance (Feet) 345 Feet    Assistive device None    Gait Pattern Step-through pattern;Trunk flexed    Ambulation Surface Level;Indoor    Gait  Comments holding stick behind back to promote upright posture      Lumbar Exercises: Seated   Other Seated Lumbar Exercises seated core exercises of latissimus pressdowns on foam roll follwed by FAQs, marching and ankle pumps, 15 reps ea. side, alternating pattern, 1# wts    Other Seated Lumbar Exercises seated core exercises of hip tosses, shoulder tosses, chops, Vs, all for 10 reps with 1.1# ball      Knee/Hip Exercises: Aerobic   Nustep level 1 x 6 minute with UE/LE's with goal ~40 steps per minute for strengthening/activity tolerance. No pain or complaints after performance.                      PT Short Term Goals - 05/28/21 1649       PT SHORT TERM GOAL #1   Title Patient to demonstarte independence in initial HEP    Baseline TBD; 05/28/21 initial HEP issued    Time 4    Period Weeks    Status New    Target Date 07/01/21      PT SHORT TERM GOAL #2   Title Assess TUG and set appropreiate goal    Baseline TBD; 05/28/21 TUG score 12.28s, no goal needed    Time 4    Period Weeks    Status New     Target Date 07/01/21      PT SHORT TERM GOAL #3   Title Assess DGI and set appropriate goal    Baseline TBD    Time 5    Period Weeks    Status New    Target Date 07/01/21      PT SHORT TERM GOAL #4   Title Patient to negotiate 4 steps with single rail and most appropriate pattern    Baseline 4 steps with B rails and step to pattern    Time 4    Period Weeks    Status New    Target Date 07/01/21      PT SHORT TERM GOAL #5   Title Ambulate 290ft with LRAD under S    Baseline 133ft w/o AD with CGA    Time 4    Period Weeks    Status New    Target Date 07/01/21               PT Long Term Goals - 05/27/21 1721       PT LONG TERM GOAL #1   Title Patient I in final HEP    Baseline TBD    Time 8    Period Weeks    Status New    Target Date 07/29/21      PT LONG TERM GOAL #2   Title Assess progress towards DGI    Baseline TBD    Time 8    Period Weeks    Status New    Target Date 07/29/21      PT LONG TERM GOAL #3   Title Increase B hip abduction to 4/5    Baseline B hip abduction 4/5 sterngth B    Time 8    Period Weeks    Status New    Target Date 07/29/21      PT LONG TERM GOAL #4   Title Patient to ambulate 581ft with LRAD across outside terrain    Baseline 142ft with CGA over level surfaces    Time 8    Period Weeks    Status New    Target Date  07/29/21      PT LONG TERM GOAL #5   Title Assess progress towards TUG    Baseline TBD    Time 8    Period Weeks    Status New    Target Date 07/29/21      Additional Long Term Goals   Additional Long Term Goals --      PT LONG TERM GOAL #6   Title Patient to negotiate 16 steps with most appropriate pattern and UE support    Baseline 4 steps with B rails and step to pattern    Time 8    Period Weeks    Status New    Target Date 07/29/21      PT LONG TERM GOAL #7   Title Patient to increase gait speed to >0.37m/s    Baseline Initial gait velacity 0.37m/s    Time 8    Period Weeks     Status New    Target Date 07/29/21                   Plan - 06/13/21 1756     Clinical Impression Statement resistance and time was increased in todays session to challenge strength and endurance, feels core activiating with seated tasks, improved trunk control noted, less need of recovery despite increased workload    Personal Factors and Comorbidities Age;Comorbidity 1    Comorbidities OA in knees/spine    Examination-Activity Limitations Locomotion Level;Stairs;Transfers    Stability/Clinical Decision Making Stable/Uncomplicated    Rehab Potential Good    PT Frequency 2x / week    PT Duration 8 weeks    PT Treatment/Interventions ADLs/Self Care Home Management;Aquatic Therapy;DME Instruction;Gait training;Stair training;Functional mobility training;Therapeutic activities;Therapeutic exercise;Balance training;Neuromuscular re-education;Patient/family education    PT Next Visit Plan continue core and trunk strengthening for improved posture with gait and improved function    PT Home Exercise Plan [email or web address]    Consulted and Agree with Plan of Care Patient;Other (Comment)   interpretter            Patient will benefit from skilled therapeutic intervention in order to improve the following deficits and impairments:  Abnormal gait, Difficulty walking, Decreased endurance, Decreased activity tolerance, Decreased mobility, Decreased strength  Visit Diagnosis: No diagnosis found.     Problem List Patient Active Problem List   Diagnosis Date Noted   S/P hernia repair 09/13/2020   Gallstones 03/13/2020   Bruising 02/23/2020   Overactive bladder 10/25/2019   Inguinal hernia 03/02/2018   Urinary tract infection 02/03/2017   Well adult exam 11/04/2016   IBS (irritable bowel syndrome) 11/04/2016   Pancreatic insufficiency 03/03/2016   Headache 12/03/2015   Elevated alkaline phosphatase level 08/03/2015   Onychomycosis 08/03/2015   Insomnia 04/02/2015   Indigestion  04/02/2015   Chest pain, atypical 12/26/2014   Pain in left hip 04/13/2014   Snoring 11/09/2012   Skin cancer 11/09/2012   Mass of ear auricle 08/09/2012   Actinic keratoses 08/09/2012   Low back pain 10/28/2011   Preop examination 10/28/2011   Uterine prolaps 10/09/2011   Atrophic vaginitis 08/29/2011   Knee pain, left 07/25/2011   Anxiety disorder 05/31/2010   Constipation 05/02/2010   Abnormal CBC 07/31/2009   Abdominal pain 08/07/2008   ABNORMAL GLUCOSE NEC 08/07/2008   ABDOMINAL XRAY, ABNORMAL 08/07/2008   CAROTID BRUIT, RIGHT 02/22/2008   Dyslipidemia 11/21/2007   OSTEOARTHRITIS 11/21/2007   OSTEOPENIA 11/21/2007   Migraines 11/15/2007   Essential hypertension 09/06/2007   GERD 09/06/2007  PALPITATIONS, HX OF 09/06/2007    Lanice Shirts PT 06/13/2021, 6:27 PM  [facility] [address] [place], [place], [address] Phone: [phone]   Fax:  [phone]  Name: Sophia Santos MRN: [id number] Date of Birth: 11-13-1934

## 2021-06-14 ENCOUNTER — Ambulatory Visit: Payer: Medicare Other

## 2021-06-20 ENCOUNTER — Encounter: Payer: Self-pay | Admitting: Nurse Practitioner

## 2021-06-20 ENCOUNTER — Other Ambulatory Visit: Payer: Self-pay

## 2021-06-20 ENCOUNTER — Ambulatory Visit: Payer: Medicare Other | Attending: Internal Medicine

## 2021-06-20 DIAGNOSIS — M6281 Muscle weakness (generalized): Secondary | ICD-10-CM | POA: Insufficient documentation

## 2021-06-20 DIAGNOSIS — R2681 Unsteadiness on feet: Secondary | ICD-10-CM | POA: Insufficient documentation

## 2021-06-20 DIAGNOSIS — R2689 Other abnormalities of gait and mobility: Secondary | ICD-10-CM | POA: Insufficient documentation

## 2021-06-20 NOTE — Therapy (Signed)
[facility] [address] [place], [place], [address] Phone: [phone]   Fax:  [phone]  Physical Therapy Treatment  Patient Details  Name: Sophia Santos MRN: [id number] Date of Birth: [birth year] Referring Provider (PT): Dr. Alain Marion   Encounter Date: 06/20/2021   PT End of Session - 06/20/21 1108     Visit Number 6    Number of Visits 17    Date for PT Re-Evaluation 07/29/21    Authorization Type [employer] MCD    Progress Note Due on Visit 10    PT Start Time 1100    PT Stop Time 1145    PT Time Calculation (min) 45 min    Equipment Utilized During Treatment Gait belt    Activity Tolerance Patient tolerated treatment well    Behavior During Therapy WFL for tasks assessed/performed             Past Medical History:  Diagnosis Date   Arthritis    Blood in stool    with rectal pain    Cancer ([facility])    skin cancer -    Cataract    bilateral eyes removed   Colon polyp    Complication of anesthesia    "years ago, hard to awaken, no with recent procedures."   Diverticulosis    Gallstones    GERD (gastroesophageal reflux disease)    Headache    HTN (hypertension)    Hyperlipidemia    Irritable bowel syndrome (IBS)    Osteoarthritis    Osteopenia    Osteoporosis    Uterine prolapse     Past Surgical History:  Procedure Laterality Date   bilateral cataracts     COLONOSCOPY     INGUINAL HERNIA REPAIR Bilateral 09/13/2020   Procedure: LAPAROSCOPIC BILATERAL INGUINAL HERNIA REPAIR WITH MESH;  Surgeon: Ralene Ok, MD;  Location: [facility];  Service: General;  Laterality: Bilateral;   POLYPECTOMY     uterine prolapse surgery      There were no vitals filed for this visit.   Subjective Assessment - 06/20/21 1106     Subjective No falls or med changes to report, has not been walking in the heat    Patient is accompained by: Interpreter    Pertinent History B knee DJD and effusion, lumbar spondylosis  and scoliosis    Limitations Standing    How long can you sit comfortably? unlimited    How long can you stand comfortably? 5 min    How long can you walk comfortably? <15 min    Patient Stated Goals To walk better    Pain Onset More than a month ago                               [facility] Adult PT Treatment/Exercise - 06/20/21 0001       Transfers   Number of Reps 10 reps    Comments from airex      Lumbar Exercises: Seated   Other Seated Lumbar Exercises seated core exercises of latissimus pressdowns on foam roll 15x    Other Seated Lumbar Exercises seated core exercises of hip tosses, shoulder tosses, chops, Vs, all for 10 reps with 2.2# ball      Knee/Hip Exercises: Aerobic   Nustep level 1 x 7 minute with UE/LE's with goal ~40 steps per minute for strengthening/activity tolerance.      Knee/Hip Exercises: Seated   Long [employer]  Both;1 set;15 reps;Weights;Limitations    Long Arc Quad Weight 2 lbs.    Long [employer] Limitations performed with lat press, alternating    Marching Both;1 set;15 reps;Weights;Limitations    Marching Limitations perormd with lat press, alternating    Marching Weights 2 lbs.                      PT Short Term Goals - 06/20/21 1115       PT SHORT TERM GOAL #1   Title Patient to demonstarte independence in initial HEP    Baseline TBD; 05/28/21 initial HEP issued    Time 4    Period Weeks    Status Achieved    Target Date 07/01/21      PT SHORT TERM GOAL #2   Title Assess TUG and set appropreiate goal    Baseline TBD; 05/28/21 TUG score 12.28s, no goal needed    Time 4    Period Weeks    Status Achieved    Target Date 07/01/21      PT SHORT TERM GOAL #3   Title Assess DGI and set appropriate goal    Baseline TBD; 06/20/21 DGI score 21, no goal needed    Time 5    Period Weeks    Status Achieved    Target Date 07/01/21      PT SHORT TERM GOAL #4   Title Patient to negotiate 4 steps with single rail and most  appropriate pattern    Baseline 4 steps with B rails and step to pattern    Time 4    Period Weeks    Status New    Target Date 07/01/21      PT SHORT TERM GOAL #5   Title Ambulate 252ft with LRAD under S    Baseline 130ft w/o AD with CGA; 06/20/21 Ambulation distance 341ft on previous session    Time 4    Period Weeks    Status Achieved    Target Date 07/01/21               PT Long Term Goals - 06/20/21 1117       PT LONG TERM GOAL #1   Title Patient I in final HEP    Baseline TBD    Time 8    Period Weeks    Status New      PT LONG TERM GOAL #2   Title Assess progress towards DGI    Baseline TBD; score of 21, no goal needed    Time 8    Period Weeks    Status Achieved      PT LONG TERM GOAL #3   Title Increase B hip abduction to 4/5    Baseline B hip abduction 4/5 sterngth B    Time 8    Period Weeks    Status New      PT LONG TERM GOAL #4   Title Patient to ambulate 525ft with LRAD across outside terrain    Baseline 110ft with CGA over level surfaces    Time 8    Period Weeks    Status New      PT LONG TERM GOAL #5   Title Assess progress towards TUG    Baseline TBD; 06/20/21 initial TUG time 13s, no goal needed    Time 8    Period Weeks    Status Achieved      PT LONG TERM GOAL #6   Title Patient  to negotiate 16 steps with most appropriate pattern and UE support    Baseline 4 steps with B rails and step to pattern    Time 8    Period Weeks    Status New      PT LONG TERM GOAL #7   Title Patient to increase gait speed to >0.68m/s    Baseline Initial gait velacity 0.38m/s    Time 8    Period Weeks    Status New                   Plan - 06/20/21 1139     Clinical Impression Statement STGs assess and continued with core strengthening tasks, increased resistance on core execises with patient able to feel core muscles activate, felt resistance on stepper was light.  Has not been able to walk outside due to heat.    Personal Factors and  Comorbidities Age;Comorbidity 1    Comorbidities OA in knees/spine    Examination-Activity Limitations Locomotion Level;Stairs;Transfers    Stability/Clinical Decision Making Stable/Uncomplicated    Rehab Potential Good    PT Frequency 2x / week    PT Duration 8 weeks    PT Treatment/Interventions ADLs/Self Care Home Management;Aquatic Therapy;DME Instruction;Gait training;Stair training;Functional mobility training;Therapeutic activities;Therapeutic exercise;Balance training;Neuromuscular re-education;Patient/family education    PT Next Visit Plan continue core and trunk strengthening for improved posture with gait, increase time on aerobic tasks and monitor posture/gait velocity    PT Home Exercise Plan [email or web address]    Consulted and Agree with Plan of Care Patient;Other (Comment)   interpretter            Patient will benefit from skilled therapeutic intervention in order to improve the following deficits and impairments:  Abnormal gait, Difficulty walking, Decreased endurance, Decreased activity tolerance, Decreased mobility, Decreased strength  Visit Diagnosis: Other abnormalities of gait and mobility  Muscle weakness (generalized)  Unsteadiness on feet     Problem List Patient Active Problem List   Diagnosis Date Noted   S/P hernia repair 09/13/2020   Gallstones 03/13/2020   Bruising 02/23/2020   Overactive bladder 10/25/2019   Inguinal hernia 03/02/2018   Urinary tract infection 02/03/2017   Well adult exam 11/04/2016   IBS (irritable bowel syndrome) 11/04/2016   Pancreatic insufficiency 03/03/2016   Headache 12/03/2015   Elevated alkaline phosphatase level 08/03/2015   Onychomycosis 08/03/2015   Insomnia 04/02/2015   Indigestion 04/02/2015   Chest pain, atypical 12/26/2014   Pain in left hip 04/13/2014   Snoring 11/09/2012   Skin cancer 11/09/2012   Mass of ear auricle 08/09/2012   Actinic keratoses 08/09/2012   Low back pain 10/28/2011   Preop examination  10/28/2011   Uterine prolaps 10/09/2011   Atrophic vaginitis 08/29/2011   Knee pain, left 07/25/2011   Anxiety disorder 05/31/2010   Constipation 05/02/2010   Abnormal CBC 07/31/2009   Abdominal pain 08/07/2008   ABNORMAL GLUCOSE NEC 08/07/2008   ABDOMINAL XRAY, ABNORMAL 08/07/2008   CAROTID BRUIT, RIGHT 02/22/2008   Dyslipidemia 11/21/2007   OSTEOARTHRITIS 11/21/2007   OSTEOPENIA 11/21/2007   Migraines 11/15/2007   Essential hypertension 09/06/2007   GERD 09/06/2007   PALPITATIONS, HX OF 09/06/2007    Lanice Shirts PT 06/20/2021, 11:45 AM  [facility] [address] [place], [place], [address] Phone: [phone]   Fax:  [phone]  Name: Mariel Lukins MRN: [id number] Date of Birth: January 15, 1934

## 2021-06-21 ENCOUNTER — Ambulatory Visit: Payer: Medicare Other

## 2021-06-21 DIAGNOSIS — R2681 Unsteadiness on feet: Secondary | ICD-10-CM

## 2021-06-21 DIAGNOSIS — M6281 Muscle weakness (generalized): Secondary | ICD-10-CM

## 2021-06-21 DIAGNOSIS — R2689 Other abnormalities of gait and mobility: Secondary | ICD-10-CM | POA: Diagnosis not present

## 2021-06-21 NOTE — Therapy (Signed)
[facility] [address] [place], [place], [address] Phone: [phone]   Fax:  [phone]  Physical Therapy Treatment  Patient Details  Name: Sophia Santos MRN: [id number] Date of Birth: Nov 15, 1934 Referring Provider (PT): Dr. Alain Marion   Encounter Date: 06/21/2021   PT End of Session - 06/21/21 1501     Visit Number 7    Number of Visits 17    Date for PT Re-Evaluation 07/29/21    Authorization Type [employer] MCD    Progress Note Due on Visit 10    PT Start Time 7619    PT Stop Time 1530    PT Time Calculation (min) 45 min    Equipment Utilized During Treatment Gait belt    Activity Tolerance Patient tolerated treatment well    Behavior During Therapy WFL for tasks assessed/performed             Past Medical History:  Diagnosis Date   Arthritis    Blood in stool    with rectal pain    Cancer ([facility])    skin cancer -    Cataract    bilateral eyes removed   Colon polyp    Complication of anesthesia    "years ago, hard to awaken, no with recent procedures."   Diverticulosis    Gallstones    GERD (gastroesophageal reflux disease)    Headache    HTN (hypertension)    Hyperlipidemia    Irritable bowel syndrome (IBS)    Osteoarthritis    Osteopenia    Osteoporosis    Uterine prolapse     Past Surgical History:  Procedure Laterality Date   bilateral cataracts     COLONOSCOPY     INGUINAL HERNIA REPAIR Bilateral 09/13/2020   Procedure: LAPAROSCOPIC BILATERAL INGUINAL HERNIA REPAIR WITH MESH;  Surgeon: Ralene Ok, MD;  Location: [facility];  Service: General;  Laterality: Bilateral;   POLYPECTOMY     uterine prolapse surgery      There were no vitals filed for this visit.   Subjective Assessment - 06/21/21 1450     Subjective No changes to note    Patient is accompained by: Interpreter    Pertinent History B knee DJD and effusion, lumbar spondylosis and scoliosis    Limitations Standing    How long  can you sit comfortably? unlimited    How long can you stand comfortably? 5 min    How long can you walk comfortably? <15 min    Patient Stated Goals To walk better    Pain Onset More than a month ago                               [facility] Adult PT Treatment/Exercise - 06/21/21 0001       Lumbar Exercises: Seated   Other Seated Lumbar Exercises seated core exercises of latissimus pressdowns on foam roll 15x, feet on rocker board    Other Seated Lumbar Exercises seated core exercises of hip tosses, shoulder tosses, chops, Vs, all for 12 reps with 2.2# ball      Knee/Hip Exercises: Aerobic   Other Aerobic Scifit L1 8' arms 6      Knee/Hip Exercises: Seated   Long Arc Quad Strengthening;Both;2 sets;10 reps;Weights;Limitations    Long Arc Quad Weight 2 lbs.    Long [employer] Limitations performed with lat press, alternating    Marching Strengthening;Both;2 sets;10 reps;Weights;Limitations    [employer]  Limitations perormd with lat press, alternating    Marching Weights 2 lbs.                      PT Short Term Goals - 06/20/21 1115       PT SHORT TERM GOAL #1   Title Patient to demonstarte independence in initial HEP    Baseline TBD; 05/28/21 initial HEP issued    Time 4    Period Weeks    Status Achieved    Target Date 07/01/21      PT SHORT TERM GOAL #2   Title Assess TUG and set appropreiate goal    Baseline TBD; 05/28/21 TUG score 12.28s, no goal needed    Time 4    Period Weeks    Status Achieved    Target Date 07/01/21      PT SHORT TERM GOAL #3   Title Assess DGI and set appropriate goal    Baseline TBD; 06/20/21 DGI score 21, no goal needed    Time 5    Period Weeks    Status Achieved    Target Date 07/01/21      PT SHORT TERM GOAL #4   Title Patient to negotiate 4 steps with single rail and most appropriate pattern    Baseline 4 steps with B rails and step to pattern    Time 4    Period Weeks    Status New    Target Date 07/01/21       PT SHORT TERM GOAL #5   Title Ambulate 290ft with LRAD under S    Baseline 135ft w/o AD with CGA; 06/20/21 Ambulation distance 345ft on previous session    Time 4    Period Weeks    Status Achieved    Target Date 07/01/21               PT Long Term Goals - 06/20/21 1117       PT LONG TERM GOAL #1   Title Patient I in final HEP    Baseline TBD    Time 8    Period Weeks    Status New      PT LONG TERM GOAL #2   Title Assess progress towards DGI    Baseline TBD; score of 21, no goal needed    Time 8    Period Weeks    Status Achieved      PT LONG TERM GOAL #3   Title Increase B hip abduction to 4/5    Baseline B hip abduction 4/5 sterngth B    Time 8    Period Weeks    Status New      PT LONG TERM GOAL #4   Title Patient to ambulate 570ft with LRAD across outside terrain    Baseline 19ft with CGA over level surfaces    Time 8    Period Weeks    Status New      PT LONG TERM GOAL #5   Title Assess progress towards TUG    Baseline TBD; 06/20/21 initial TUG time 13s, no goal needed    Time 8    Period Weeks    Status Achieved      PT LONG TERM GOAL #6   Title Patient to negotiate 16 steps with most appropriate pattern and UE support    Baseline 4 steps with B rails and step to pattern    Time 8    Period Weeks  Status New      PT LONG TERM GOAL #7   Title Patient to increase gait speed to >0.38m/s    Baseline Initial gait velacity 0.32m/s    Time 8    Period Weeks    Status New                   Plan - 06/21/21 1511     Clinical Impression Statement Continued core strengthening and aerobic training trying to improve on core strength, endurance and overall function.  Increased reps and difficulty as noted, used unstable surface for core exercises to challenge balance, added time to aerobic tasks and continued to build on functional patterns/tasks    Personal Factors and Comorbidities Age;Comorbidity 1    Comorbidities OA in knees/spine     Examination-Activity Limitations Locomotion Level;Stairs;Transfers    Stability/Clinical Decision Making Stable/Uncomplicated    Rehab Potential Good    PT Frequency 2x / week    PT Duration 8 weeks    PT Treatment/Interventions ADLs/Self Care Home Management;Aquatic Therapy;DME Instruction;Gait training;Stair training;Functional mobility training;Therapeutic activities;Therapeutic exercise;Balance training;Neuromuscular re-education;Patient/family education    PT Next Visit Plan continue core and trunk strengthening for improved posture with gait, increase time on aerobic tasks and monitor posture/gait velocity, continue to challenge core and balnce activities incorporating postural control    PT Home Exercise Plan [email or web address]    Consulted and Agree with Plan of Care Patient;Other (Comment)   interpretter            Patient will benefit from skilled therapeutic intervention in order to improve the following deficits and impairments:  Abnormal gait, Difficulty walking, Decreased endurance, Decreased activity tolerance, Decreased mobility, Decreased strength  Visit Diagnosis: Other abnormalities of gait and mobility  Muscle weakness (generalized)  Unsteadiness on feet     Problem List Patient Active Problem List   Diagnosis Date Noted   S/P hernia repair 09/13/2020   Gallstones 03/13/2020   Bruising 02/23/2020   Overactive bladder 10/25/2019   Inguinal hernia 03/02/2018   Urinary tract infection 02/03/2017   Well adult exam 11/04/2016   IBS (irritable bowel syndrome) 11/04/2016   Pancreatic insufficiency 03/03/2016   Headache 12/03/2015   Elevated alkaline phosphatase level 08/03/2015   Onychomycosis 08/03/2015   Insomnia 04/02/2015   Indigestion 04/02/2015   Chest pain, atypical 12/26/2014   Pain in left hip 04/13/2014   Snoring 11/09/2012   Skin cancer 11/09/2012   Mass of ear auricle 08/09/2012   Actinic keratoses 08/09/2012   Low back pain 10/28/2011   Preop  examination 10/28/2011   Uterine prolaps 10/09/2011   Atrophic vaginitis 08/29/2011   Knee pain, left 07/25/2011   Anxiety disorder 05/31/2010   Constipation 05/02/2010   Abnormal CBC 07/31/2009   Abdominal pain 08/07/2008   ABNORMAL GLUCOSE NEC 08/07/2008   ABDOMINAL XRAY, ABNORMAL 08/07/2008   CAROTID BRUIT, RIGHT 02/22/2008   Dyslipidemia 11/21/2007   OSTEOARTHRITIS 11/21/2007   OSTEOPENIA 11/21/2007   Migraines 11/15/2007   Essential hypertension 09/06/2007   GERD 09/06/2007   PALPITATIONS, HX OF 09/06/2007    Lanice Shirts PT 06/21/2021, 3:39 PM  [facility] [address] [place], [place], [address] Phone: [phone]   Fax:  [phone]  Name: Sophia Santos MRN: [id number] Date of Birth: 1934-01-02

## 2021-06-25 ENCOUNTER — Other Ambulatory Visit: Payer: Self-pay

## 2021-06-25 ENCOUNTER — Ambulatory Visit: Payer: Medicare Other

## 2021-06-25 DIAGNOSIS — M6281 Muscle weakness (generalized): Secondary | ICD-10-CM | POA: Diagnosis not present

## 2021-06-25 DIAGNOSIS — R2681 Unsteadiness on feet: Secondary | ICD-10-CM

## 2021-06-25 DIAGNOSIS — R2689 Other abnormalities of gait and mobility: Secondary | ICD-10-CM | POA: Diagnosis not present

## 2021-06-25 NOTE — Therapy (Signed)
[facility] [address] [place], [place], [address] Phone: [phone]   Fax:  [phone]  Physical Therapy Treatment  Patient Details  Name: Sophia Santos MRN: [id number] Date of Birth: 06/24/1934 Referring Provider (PT): Dr. Alain Marion   Encounter Date: 06/25/2021   PT End of Session - 06/25/21 1126     Visit Number 8    Number of Visits 17    Date for PT Re-Evaluation 07/29/21    Authorization Type [employer] MCD    Progress Note Due on Visit 10    PT Start Time 1100    PT Stop Time 1145    PT Time Calculation (min) 45 min    Equipment Utilized During Treatment Gait belt    Activity Tolerance Patient tolerated treatment well    Behavior During Therapy WFL for tasks assessed/performed             Past Medical History:  Diagnosis Date   Arthritis    Blood in stool    with rectal pain    Cancer ([facility])    skin cancer -    Cataract    bilateral eyes removed   Colon polyp    Complication of anesthesia    "years ago, hard to awaken, no with recent procedures."   Diverticulosis    Gallstones    GERD (gastroesophageal reflux disease)    Headache    HTN (hypertension)    Hyperlipidemia    Irritable bowel syndrome (IBS)    Osteoarthritis    Osteopenia    Osteoporosis    Uterine prolapse     Past Surgical History:  Procedure Laterality Date   bilateral cataracts     COLONOSCOPY     INGUINAL HERNIA REPAIR Bilateral 09/13/2020   Procedure: LAPAROSCOPIC BILATERAL INGUINAL HERNIA REPAIR WITH MESH;  Surgeon: Ralene Ok, MD;  Location: [facility];  Service: General;  Laterality: Bilateral;   POLYPECTOMY     uterine prolapse surgery      There were no vitals filed for this visit.   Subjective Assessment - 06/25/21 1116     Subjective B hip pain slightly worse with ambulation tasks, uses a RW at home for stability    Patient is accompained by: Interpreter    Pertinent History B knee DJD and effusion, lumbar  spondylosis and scoliosis    Limitations Standing    How long can you sit comfortably? unlimited    How long can you stand comfortably? 5 min    How long can you walk comfortably? <15 min    Patient Stated Goals To walk better    Pain Onset More than a month ago                               [facility] Adult PT Treatment/Exercise - 06/25/21 0001       Transfers   Transfers Sit to Stand    Sit to Stand 5: Supervision;4: Min guard    Number of Reps 10 reps    Comments from [employer] with ball b/t knees to increase BOS      Ambulation/Gait   Ambulation/Gait Yes    Ambulation/Gait Assistance 5: Supervision;4: Min guard    Ambulation Distance (Feet) 345 Feet    Assistive device None    Gait Pattern Step-through pattern;Trunk flexed    Ambulation Surface Level;Indoor    Gait Comments holding stick behind back to promote upright posture  Lumbar Exercises: Seated   Other Seated Lumbar Exercises seated core exercises of latissimus pressdowns on foam roll 15x, feet on rocker board    Other Seated Lumbar Exercises seated core exercises of hip tosses, shoulder tosses, chops, Vs, all for 10 reps with 3.3# ball      Knee/Hip Exercises: Aerobic   Other Aerobic Scifit L1.5 8' arms 6      Knee/Hip Exercises: Seated   Long Arc Quad Strengthening;Both;2 sets;15 reps;Weights;Limitations    Long Arc Quad Weight 2 lbs.    Long [employer] Limitations performed with lat press, alternating    Marching Strengthening;Both;2 sets;10 reps;Weights;Limitations    Marching Limitations performed with lat press, alternating    Marching Weights 2 lbs.                      PT Short Term Goals - 06/20/21 1115       PT SHORT TERM GOAL #1   Title Patient to demonstarte independence in initial HEP    Baseline TBD; 05/28/21 initial HEP issued    Time 4    Period Weeks    Status Achieved    Target Date 07/01/21      PT SHORT TERM GOAL #2   Title Assess TUG and set appropreiate  goal    Baseline TBD; 05/28/21 TUG score 12.28s, no goal needed    Time 4    Period Weeks    Status Achieved    Target Date 07/01/21      PT SHORT TERM GOAL #3   Title Assess DGI and set appropriate goal    Baseline TBD; 06/20/21 DGI score 21, no goal needed    Time 5    Period Weeks    Status Achieved    Target Date 07/01/21      PT SHORT TERM GOAL #4   Title Patient to negotiate 4 steps with single rail and most appropriate pattern    Baseline 4 steps with B rails and step to pattern    Time 4    Period Weeks    Status New    Target Date 07/01/21      PT SHORT TERM GOAL #5   Title Ambulate 240ft with LRAD under S    Baseline 151ft w/o AD with CGA; 06/20/21 Ambulation distance 336ft on previous session    Time 4    Period Weeks    Status Achieved    Target Date 07/01/21               PT Long Term Goals - 06/20/21 1117       PT LONG TERM GOAL #1   Title Patient I in final HEP    Baseline TBD    Time 8    Period Weeks    Status New      PT LONG TERM GOAL #2   Title Assess progress towards DGI    Baseline TBD; score of 21, no goal needed    Time 8    Period Weeks    Status Achieved      PT LONG TERM GOAL #3   Title Increase B hip abduction to 4/5    Baseline B hip abduction 4/5 sterngth B    Time 8    Period Weeks    Status New      PT LONG TERM GOAL #4   Title Patient to ambulate 521ft with LRAD across outside terrain    Baseline 18ft with CGA  over level surfaces    Time 8    Period Weeks    Status New      PT LONG TERM GOAL #5   Title Assess progress towards TUG    Baseline TBD; 06/20/21 initial TUG time 13s, no goal needed    Time 8    Period Weeks    Status Achieved      PT LONG TERM GOAL #6   Title Patient to negotiate 16 steps with most appropriate pattern and UE support    Baseline 4 steps with B rails and step to pattern    Time 8    Period Weeks    Status New      PT LONG TERM GOAL #7   Title Patient to increase gait speed to  >0.76m/s    Baseline Initial gait velacity 0.37m/s    Time 8    Period Weeks    Status New                   Plan - 06/25/21 1127     Clinical Impression Statement Continued core and LE strengthneing, increased weight/resistance but decreased reps accordingly.  Increased resistance on aerobic activities, assessed B hip pain with tenderness noted to B greatr trochanters with an increased valgus angle noted at hips/knees.    Personal Factors and Comorbidities Age;Comorbidity 1    Comorbidities OA in knees/spine    Examination-Activity Limitations Locomotion Level;Stairs;Transfers    Stability/Clinical Decision Making Stable/Uncomplicated    Rehab Potential Good    PT Frequency 2x / week    PT Duration 8 weeks    PT Treatment/Interventions ADLs/Self Care Home Management;Aquatic Therapy;DME Instruction;Gait training;Stair training;Functional mobility training;Therapeutic activities;Therapeutic exercise;Balance training;Neuromuscular re-education;Patient/family education    PT Next Visit Plan continue core and trunk strengthening for improved posture with gait, increase resistance on aerobic tasks and monitor posture/gait velocity, continue to challenge core and balance activities incorporating postural control, B ITB stretch    PT Home Exercise Plan [email or web address]    Consulted and Agree with Plan of Care Patient;Other (Comment)   interpretter            Patient will benefit from skilled therapeutic intervention in order to improve the following deficits and impairments:  Abnormal gait, Difficulty walking, Decreased endurance, Decreased activity tolerance, Decreased mobility, Decreased strength  Visit Diagnosis: Other abnormalities of gait and mobility  Muscle weakness (generalized)  Unsteadiness on feet     Problem List Patient Active Problem List   Diagnosis Date Noted   S/P hernia repair 09/13/2020   Gallstones 03/13/2020   Bruising 02/23/2020   Overactive bladder  10/25/2019   Inguinal hernia 03/02/2018   Urinary tract infection 02/03/2017   Well adult exam 11/04/2016   IBS (irritable bowel syndrome) 11/04/2016   Pancreatic insufficiency 03/03/2016   Headache 12/03/2015   Elevated alkaline phosphatase level 08/03/2015   Onychomycosis 08/03/2015   Insomnia 04/02/2015   Indigestion 04/02/2015   Chest pain, atypical 12/26/2014   Pain in left hip 04/13/2014   Snoring 11/09/2012   Skin cancer 11/09/2012   Mass of ear auricle 08/09/2012   Actinic keratoses 08/09/2012   Low back pain 10/28/2011   Preop examination 10/28/2011   Uterine prolaps 10/09/2011   Atrophic vaginitis 08/29/2011   Knee pain, left 07/25/2011   Anxiety disorder 05/31/2010   Constipation 05/02/2010   Abnormal CBC 07/31/2009   Abdominal pain 08/07/2008   ABNORMAL GLUCOSE NEC 08/07/2008   ABDOMINAL XRAY, ABNORMAL 08/07/2008   CAROTID BRUIT, RIGHT  02/22/2008   Dyslipidemia 11/21/2007   OSTEOARTHRITIS 11/21/2007   OSTEOPENIA 11/21/2007   Migraines 11/15/2007   Essential hypertension 09/06/2007   GERD 09/06/2007   PALPITATIONS, HX OF 09/06/2007    Lanice Shirts PT 06/25/2021, 11:46 AM  [facility] [address] [place], [place], [address] Phone: [phone]   Fax:  [phone]  Name: Kamylah Manzo MRN: [id number] Date of Birth: 04-14-1934

## 2021-06-28 ENCOUNTER — Ambulatory Visit: Payer: Medicare Other

## 2021-06-28 ENCOUNTER — Other Ambulatory Visit: Payer: Self-pay

## 2021-06-28 DIAGNOSIS — R2689 Other abnormalities of gait and mobility: Secondary | ICD-10-CM | POA: Diagnosis not present

## 2021-06-28 DIAGNOSIS — M6281 Muscle weakness (generalized): Secondary | ICD-10-CM | POA: Diagnosis not present

## 2021-06-28 DIAGNOSIS — R2681 Unsteadiness on feet: Secondary | ICD-10-CM | POA: Diagnosis not present

## 2021-06-28 NOTE — Therapy (Signed)
[facility] [address] [place], [place], [address] Phone: [phone]   Fax:  [phone]  Physical Therapy Treatment  Patient Details  Name: Sophia Santos MRN: [id number] Date of Birth: [birth year] Referring Provider (PT): Dr. Alain Marion   Encounter Date: 06/28/2021   PT End of Session - 06/28/21 1000     Visit Number 9    Number of Visits 17    Date for PT Re-Evaluation 07/29/21    Authorization Type UHC MCD    Progress Note Due on Visit 10    PT Start Time 1015    PT Stop Time 1100    PT Time Calculation (min) 45 min    Equipment Utilized During Treatment Gait belt    Activity Tolerance Patient tolerated treatment well    Behavior During Therapy WFL for tasks assessed/performed             Past Medical History:  Diagnosis Date   Arthritis    Blood in stool    with rectal pain    Cancer ([place])    skin cancer -    Cataract    bilateral eyes removed   Colon polyp    Complication of anesthesia    "years ago, hard to awaken, no with recent procedures."   Diverticulosis    Gallstones    GERD (gastroesophageal reflux disease)    Headache    HTN (hypertension)    Hyperlipidemia    Irritable bowel syndrome (IBS)    Osteoarthritis    Osteopenia    Osteoporosis    Uterine prolapse     Past Surgical History:  Procedure Laterality Date   bilateral cataracts     COLONOSCOPY     INGUINAL HERNIA REPAIR Bilateral 09/13/2020   Procedure: LAPAROSCOPIC BILATERAL INGUINAL HERNIA REPAIR WITH MESH;  Surgeon: Ralene Ok, MD;  Location: [facility];  Service: General;  Laterality: Bilateral;   POLYPECTOMY     uterine prolapse surgery      There were no vitals filed for this visit.   Subjective Assessment - 06/28/21 1014     Subjective Continues to cite pain in LS and posterior hip region    Patient is accompained by: Interpreter    Pertinent History B knee DJD and effusion, lumbar spondylosis and scoliosis     Limitations Standing    How long can you sit comfortably? unlimited    How long can you stand comfortably? 5 min    How long can you walk comfortably? <15 min    Patient Stated Goals To walk better    Pain Onset More than a month ago                               [facility] Adult PT Treatment/Exercise - 06/28/21 0001       Transfers   Transfers Sit to Stand    Sit to Stand 5: Supervision    Number of Reps 10 reps    Comments from airex keeping knees apart      Lumbar Exercises: Seated   Other Seated Lumbar Exercises seated core exercises of latissimus pressdowns on foam roll 15x, feet on airex    Other Seated Lumbar Exercises seated core exercises of hip tosses, shoulder tosses, chops, Vs, all for 12 reps with 3.3# ball      Knee/Hip Exercises: Aerobic   Other Aerobic Scifit L2 8' arms 8 seat 15  Knee/Hip Exercises: Seated   Long Arc Quad Strengthening;Both;15 reps;Weights;Limitations    Long Arc Quad Weight 3 lbs.    Long [employer] Limitations performed with lat press, alternating    Marching Strengthening;Both;1 set;15 reps    Marching Limitations performed with lat press, alternating    Marching Weights 3 lbs.                 Balance Exercises - 06/28/21 0001       Balance Exercises: Standing   Heel Raises Both;15 reps;Limitations    Heel Raises Limitations UE support    Toe Raise Both;15 reps;Limitations    Toe Raise Limitations UE support    Other Standing Exercises opposite UE/LE abduction, 10x per side                 PT Short Term Goals - 06/20/21 1115       PT SHORT TERM GOAL #1   Title Patient to demonstarte independence in initial HEP    Baseline TBD; 05/28/21 initial HEP issued    Time 4    Period Weeks    Status Achieved    Target Date 07/01/21      PT SHORT TERM GOAL #2   Title Assess TUG and set appropreiate goal    Baseline TBD; 05/28/21 TUG score 12.28s, no goal needed    Time 4    Period Weeks    Status  Achieved    Target Date 07/01/21      PT SHORT TERM GOAL #3   Title Assess DGI and set appropriate goal    Baseline TBD; 06/20/21 DGI score 21, no goal needed    Time 5    Period Weeks    Status Achieved    Target Date 07/01/21      PT SHORT TERM GOAL #4   Title Patient to negotiate 4 steps with single rail and most appropriate pattern    Baseline 4 steps with B rails and step to pattern    Time 4    Period Weeks    Status New    Target Date 07/01/21      PT SHORT TERM GOAL #5   Title Ambulate 256ft with LRAD under S    Baseline 170ft w/o AD with CGA; 06/20/21 Ambulation distance 322ft on previous session    Time 4    Period Weeks    Status Achieved    Target Date 07/01/21               PT Long Term Goals - 06/20/21 1117       PT LONG TERM GOAL #1   Title Patient I in final HEP    Baseline TBD    Time 8    Period Weeks    Status New      PT LONG TERM GOAL #2   Title Assess progress towards DGI    Baseline TBD; score of 21, no goal needed    Time 8    Period Weeks    Status Achieved      PT LONG TERM GOAL #3   Title Increase B hip abduction to 4/5    Baseline B hip abduction 4/5 sterngth B    Time 8    Period Weeks    Status New      PT LONG TERM GOAL #4   Title Patient to ambulate 512ft with LRAD across outside terrain    Baseline 131ft with CGA over level surfaces  Time 8    Period Weeks    Status New      PT LONG TERM GOAL #5   Title Assess progress towards TUG    Baseline TBD; 06/20/21 initial TUG time 13s, no goal needed    Time 8    Period Weeks    Status Achieved      PT LONG TERM GOAL #6   Title Patient to negotiate 16 steps with most appropriate pattern and UE support    Baseline 4 steps with B rails and step to pattern    Time 8    Period Weeks    Status New      PT LONG TERM GOAL #7   Title Patient to increase gait speed to >0.25m/s    Baseline Initial gait velacity 0.39m/s    Time 8    Period Weeks    Status New                    Plan - 06/28/21 1055     Clinical Impression Statement Increasaed weight, resistance and reps today, added standing balance and stabilization activities to address lumbosacral weakness.  ITB stretches unremarkable for pain or restrictions but piriformis stertching targeted areas of disconfort.  Added stertch to HEP    Personal Factors and Comorbidities Comorbidity 1    Comorbidities OA in knees/spine    Examination-Activity Limitations Locomotion Level;Stairs;Transfers    Stability/Clinical Decision Making Stable/Uncomplicated    Rehab Potential Good    PT Frequency 2x / week    PT Duration 8 weeks    PT Treatment/Interventions ADLs/Self Care Home Management;Aquatic Therapy;DME Instruction;Gait training;Stair training;Functional mobility training;Therapeutic activities;Therapeutic exercise;Balance training;Neuromuscular re-education;Patient/family education    PT Next Visit Plan continue core and trunk strengthening for improved posture with gait, increase resistance on aerobic tasks and monitor posture/gait velocity, continue to challenge core and balance activities incorporating postural control, f/u on priformis stertch and add stepping tasks, 10th visit progress note    PT Home Exercise Plan [email or web address]    Consulted and Agree with Plan of Care Patient;Other (Comment)   interpretter            Patient will benefit from skilled therapeutic intervention in order to improve the following deficits and impairments:  Abnormal gait, Difficulty walking, Decreased endurance, Decreased activity tolerance, Decreased mobility, Decreased strength  Visit Diagnosis: Other abnormalities of gait and mobility  Unsteadiness on feet  Muscle weakness (generalized)     Problem List Patient Active Problem List   Diagnosis Date Noted   S/P hernia repair 09/13/2020   Gallstones 03/13/2020   Bruising 02/23/2020   Overactive bladder 10/25/2019   Inguinal hernia 03/02/2018   Urinary  tract infection 02/03/2017   Well adult exam 11/04/2016   IBS (irritable bowel syndrome) 11/04/2016   Pancreatic insufficiency 03/03/2016   Headache 12/03/2015   Elevated alkaline phosphatase level 08/03/2015   Onychomycosis 08/03/2015   Insomnia 04/02/2015   Indigestion 04/02/2015   Chest pain, atypical 12/26/2014   Pain in left hip 04/13/2014   Snoring 11/09/2012   Skin cancer 11/09/2012   Mass of ear auricle 08/09/2012   Actinic keratoses 08/09/2012   Low back pain 10/28/2011   Preop examination 10/28/2011   Uterine prolaps 10/09/2011   Atrophic vaginitis 08/29/2011   Knee pain, left 07/25/2011   Anxiety disorder 05/31/2010   Constipation 05/02/2010   Abnormal CBC 07/31/2009   Abdominal pain 08/07/2008   ABNORMAL GLUCOSE NEC 08/07/2008   ABDOMINAL XRAY, ABNORMAL 08/07/2008  CAROTID BRUIT, RIGHT 02/22/2008   Dyslipidemia 11/21/2007   OSTEOARTHRITIS 11/21/2007   OSTEOPENIA 11/21/2007   Migraines 11/15/2007   Essential hypertension 09/06/2007   GERD 09/06/2007   PALPITATIONS, HX OF 09/06/2007    Lanice Shirts PT 06/28/2021, 11:03 AM  [facility] [address] [place], [place], [address] Phone: [phone]   Fax:  [phone]  Name: Sophia Santos MRN: [id number] Date of Birth: 01/04/1934

## 2021-06-28 NOTE — Patient Instructions (Signed)
Access Code: [email or web address] URL: [email or web address] Date: 06/28/2021 Prepared by: Sharlynn Oliphant  Exercises Sit to Stand with Arms Crossed - 2 x daily - 7 x weekly - 1 sets - 5 reps Heel Toe Raises with Counter Support - 2 x daily - 7 x weekly - 2 sets - 10 reps Clamshell - 2 x daily - 7 x weekly - 2 sets - 10 reps Supine Bridge - 2 x daily - 7 x weekly - 2 sets - 10 reps Supine March - 2 x daily - 7 x weekly - 2 sets - 10 reps Supine Piriformis Stretch with Leg Straight - 2 x daily - 7 x weekly - 1 sets - 2 reps - 30s hold

## 2021-07-02 ENCOUNTER — Ambulatory Visit: Payer: Medicare Other | Admitting: Physical Therapy

## 2021-07-02 ENCOUNTER — Encounter: Payer: Self-pay | Admitting: Physical Therapy

## 2021-07-02 ENCOUNTER — Other Ambulatory Visit: Payer: Self-pay

## 2021-07-02 DIAGNOSIS — M6281 Muscle weakness (generalized): Secondary | ICD-10-CM | POA: Diagnosis not present

## 2021-07-02 DIAGNOSIS — R2689 Other abnormalities of gait and mobility: Secondary | ICD-10-CM | POA: Diagnosis not present

## 2021-07-02 DIAGNOSIS — R2681 Unsteadiness on feet: Secondary | ICD-10-CM | POA: Diagnosis not present

## 2021-07-02 NOTE — Therapy (Addendum)
[facility] [address] [place], [place], [address] Phone: [phone]   Fax:  [phone]  Physical Therapy Treatment/Progress Note  Patient Details  Name: Sophia Santos MRN: [id number] Date of Birth: [birth year] Referring Provider (PT): Dr. Alain Marion   Encounter Date: 07/02/2021     Dates of Reporting Period:05/27/21-07/02/21  See Note below for Objective Data and Assessment of Progress/Goals.    PT End of Session - 07/02/21 1736     Visit Number 10    Number of Visits 17    Date for PT Re-Evaluation 07/29/21    Authorization Type UHC medicare/MCD secondary    Progress Note Due on Visit 10    PT Start Time 1317    PT Stop Time 1400    PT Time Calculation (min) 43 min    Equipment Utilized During Treatment Gait belt    Activity Tolerance Patient tolerated treatment well    Behavior During Therapy WFL for tasks assessed/performed             Past Medical History:  Diagnosis Date   Arthritis    Blood in stool    with rectal pain    Cancer ([place])    skin cancer -    Cataract    bilateral eyes removed   Colon polyp    Complication of anesthesia    "years ago, hard to awaken, no with recent procedures."   Diverticulosis    Gallstones    GERD (gastroesophageal reflux disease)    Headache    HTN (hypertension)    Hyperlipidemia    Irritable bowel syndrome (IBS)    Osteoarthritis    Osteopenia    Osteoporosis    Uterine prolapse     Past Surgical History:  Procedure Laterality Date   bilateral cataracts     COLONOSCOPY     INGUINAL HERNIA REPAIR Bilateral 09/13/2020   Procedure: LAPAROSCOPIC BILATERAL INGUINAL HERNIA REPAIR WITH MESH;  Surgeon: Ralene Ok, MD;  Location: [facility];  Service: General;  Laterality: Bilateral;   POLYPECTOMY     uterine prolapse surgery      There were no vitals filed for this visit.   Subjective Assessment - 07/02/21 1321     Subjective having some abdominal  pain/discomfort today. No falls. Only reports having the back pain with walking. No issues with new ex's, just not able to do them much due to stomach issues    Patient is accompained by: Interpreter    Pertinent History B knee DJD and effusion, lumbar spondylosis and scoliosis    Limitations Standing    How long can you sit comfortably? unlimited    How long can you stand comfortably? 5 min    How long can you walk comfortably? <15 min    Patient Stated Goals To walk better    Currently in Pain? No/denies    Pain Score 0-No pain                    [facility] Adult PT Treatment/Exercise - 07/02/21 1324       Transfers   Transfers Sit to Stand;Stand to Sit    Sit to Stand 5: Supervision    Stand to Sit 5: Supervision      Ambulation/Gait   Ambulation/Gait Yes    Ambulation/Gait Assistance 5: Supervision    Ambulation/Gait Assistance Details use of yard sticks to promote recipocal arm swing, along with verbal cues for upright posture for 350 feet; with gait around  clinic afterwards pt continued to need reminder cues on posture and arm swing.    Ambulation Distance (Feet) 350 Feet   x1, plus around gym with session   Assistive device None    Gait Pattern Step-through pattern;Trunk flexed    Ambulation Surface Level;Indoor    Stairs Yes    Stairs Assistance 5: Supervision    Stairs Assistance Details (indicate cue type and reason) no cues or assistance needed    Stair Management Technique One rail Right;One rail Left;Alternating pattern;Forwards    Number of Stairs 4   x 1 reps with each rail   Height of Stairs 6      Knee/Hip Exercises: Aerobic   Nustep Level 2.0 x 6 minutes with bil UE/LE's with goal >/= 40 steps per minute for strengthening and activity tolerance. No increase in pain, mild increase in abdominal discomfort.                 Balance Exercises - 07/02/21 1341       Balance Exercises: Standing   Standing Eyes Closed Narrow base of support (BOS);Wide  (BOA);Head turns;Foam/compliant surface;Other reps (comment);30 secs;Limitations    Standing Eyes Closed Limitations on airex with no UE support: feet together for EC 30 sec's x 3 reps, then feet apart for EC head movements left<>right, up<>down for ~7-8 reps each. min guard to min assist for balance with cues on posture and weight shifting to assist with balance.    Rockerboard Anterior/posterior;EO;Other reps (comment);Intermittent UE support;Limitations    Rockerboard Limitations on board in ant/post direction working on rocking the board with EO, emphasis on tall posture and weight shifting. then had pt work on holding the board steady for alternating UE raises, progressing to bil UE raises for 5-6 reps each, then had pt perform shoulder horz abd for 10 reps. min guard to min assist for balance.                 PT Short Term Goals - 07/02/21 1323       PT SHORT TERM GOAL #1   Title Patient to demonstarte independence in initial HEP    Baseline 05/28/21 initial HEP issued    Status Achieved    Target Date 07/01/21      PT SHORT TERM GOAL #2   Title Assess TUG and set appropreiate goal    Baseline 05/28/21 TUG score 12.28s, no goal needed    Status Achieved      PT SHORT TERM GOAL #3   Title Assess DGI and set appropriate goal    Baseline 06/20/21 DGI score 21, no goal needed    Status Achieved      PT SHORT TERM GOAL #4   Title Patient to negotiate 4 steps with single rail and most appropriate pattern    Baseline 07/02/21: met in session today with single rail with alternating pattern    Status Achieved    Target Date --      PT SHORT TERM GOAL #5   Title Ambulate 277f with LRAD under S    Baseline 06/20/21: Ambulation distance [id number] previous session    Status Achieved    Target Date 07/01/21               PT Long Term Goals - 06/20/21 1117       PT LONG TERM GOAL #1   Title Patient I in final HEP    Baseline TBD    Time 8    Period  Weeks    Status New       PT LONG TERM GOAL #2   Title Assess progress towards DGI    Baseline TBD; score of 21, no goal needed    Time 8    Period Weeks    Status Achieved      PT LONG TERM GOAL #3   Title Increase B hip abduction to 4/5    Baseline B hip abduction 4/5 sterngth B    Time 8    Period Weeks    Status New      PT LONG TERM GOAL #4   Title Patient to ambulate 554f with LRAD across outside terrain    Baseline 1255fwith CGA over level surfaces    Time 8    Period Weeks    Status New      PT LONG TERM GOAL #5   Title Assess progress towards TUG    Baseline TBD; 06/20/21 initial TUG time 13s, no goal needed    Time 8    Period Weeks    Status Achieved      PT LONG TERM GOAL #6   Title Patient to negotiate 16 steps with most appropriate pattern and UE support    Baseline 4 steps with B rails and step to pattern    Time 8    Period Weeks    Status New      PT LONG TERM GOAL #7   Title Patient to increase gait speed to >0.26m67m   Baseline Initial gait velacity 0.27m63m  Time 8    Period Weeks    Status New                   Plan - 07/02/21 1322     Clinical Impression Statement Today's skilled session initially focused on progress toward remaining STG with goal met. Pt now able to negotiate steps with single rail with alternating pattern (at eval needed bil rails with step too pattern). Remainder of session continued to address strengthening, gait with emphasis on posture/arm swing and balance training on compliant surfaces. No issues noted or reported in session. No increase in pain reported. The pt is making steady progress and should benefit from continued PT to progress toward unmet goals.    Personal Factors and Comorbidities Comorbidity 1    Comorbidities OA in knees/spine    Examination-Activity Limitations Locomotion Level;Stairs;Transfers    Stability/Clinical Decision Making Stable/Uncomplicated    Rehab Potential Good    PT Frequency 2x / week    PT Duration  8 weeks    PT Treatment/Interventions ADLs/Self Care Home Management;Aquatic Therapy;DME Instruction;Gait training;Stair training;Functional mobility training;Therapeutic activities;Therapeutic exercise;Balance training;Neuromuscular re-education;Patient/family education    PT Next Visit Plan continue core and trunk strengthening for improved posture with gait, increase resistance on aerobic tasks and monitor posture/gait velocity, continue to challenge core and balance activities incorporating postural control, add stepping tasks to HEP as indicated    PT Home Exercise Plan [email or web address]    Consulted and Agree with Plan of Care Patient;Other (Comment)   interpretter            Patient will benefit from skilled therapeutic intervention in order to improve the following deficits and impairments:  Abnormal gait, Difficulty walking, Decreased endurance, Decreased activity tolerance, Decreased mobility, Decreased strength  Visit Diagnosis: Other abnormalities of gait and mobility  Unsteadiness on feet  Muscle weakness (generalized)     Problem List Patient Active Problem List  Diagnosis Date Noted   S/P hernia repair 09/13/2020   Gallstones 03/13/2020   Bruising 02/23/2020   Overactive bladder 10/25/2019   Inguinal hernia 03/02/2018   Urinary tract infection 02/03/2017   Well adult exam 11/04/2016   IBS (irritable bowel syndrome) 11/04/2016   Pancreatic insufficiency 03/03/2016   Headache 12/03/2015   Elevated alkaline phosphatase level 08/03/2015   Onychomycosis 08/03/2015   Insomnia 04/02/2015   Indigestion 04/02/2015   Chest pain, atypical 12/26/2014   Pain in left hip 04/13/2014   Snoring 11/09/2012   Skin cancer 11/09/2012   Mass of ear auricle 08/09/2012   Actinic keratoses 08/09/2012   Low back pain 10/28/2011   Preop examination 10/28/2011   Uterine prolaps 10/09/2011   Atrophic vaginitis 08/29/2011   Knee pain, left 07/25/2011   Anxiety disorder 05/31/2010    Constipation 05/02/2010   Abnormal CBC 07/31/2009   Abdominal pain 08/07/2008   ABNORMAL GLUCOSE NEC 08/07/2008   ABDOMINAL XRAY, ABNORMAL 08/07/2008   CAROTID BRUIT, RIGHT 02/22/2008   Dyslipidemia 11/21/2007   OSTEOARTHRITIS 11/21/2007   OSTEOPENIA 11/21/2007   Migraines 11/15/2007   Essential hypertension 09/06/2007   GERD 09/06/2007   PALPITATIONS, HX OF 09/06/2007    Willow Ora, PTA, [facility] [address], [place], [place] [address] [phone] 07/02/21, 5:59 PM   Name: Sophia Santos MRN: [id number] Date of Birth: Apr 02, 1934

## 2021-07-04 ENCOUNTER — Ambulatory Visit: Payer: Medicare Other | Admitting: Physical Therapy

## 2021-07-09 ENCOUNTER — Ambulatory Visit: Payer: Medicare Other | Admitting: Physical Therapy

## 2021-07-11 ENCOUNTER — Ambulatory Visit: Payer: Medicare Other | Admitting: Physical Therapy

## 2021-07-15 ENCOUNTER — Other Ambulatory Visit: Payer: Self-pay

## 2021-07-15 ENCOUNTER — Encounter: Payer: Self-pay | Admitting: Family Medicine

## 2021-07-15 ENCOUNTER — Ambulatory Visit (INDEPENDENT_AMBULATORY_CARE_PROVIDER_SITE_OTHER): Payer: Medicare Other | Admitting: Family Medicine

## 2021-07-15 VITALS — BP 137/77 | HR 61

## 2021-07-15 DIAGNOSIS — N950 Postmenopausal bleeding: Secondary | ICD-10-CM | POA: Diagnosis not present

## 2021-07-15 NOTE — Progress Notes (Signed)
GYNECOLOGY OFFICE VISIT NOTE  History:   Sophia Santos is a 87 y.o. G1P1001 here today for post-menopausal vaginal bleeding.   Reports noticing this about two weeks ago and has slowed since. Sees a few drops on her panty liner daily and will have some blood while wiping. She goes to PT, noticed bleeding after she increased the intensity of the bike (chair type bike) routine, she feels that this is causing the bleeding. Bleeding has slowed as she did not go to PT last week because of it. Menopause at age 89. No personal or family history of endometrial/ovarian/breast cancer. FH of colon cancer in mom (passed away) and sister 1986-03-25, currently undergoing treatment). Patient having normal stools, colonoscopy in 03-26-2015 with no dysplasia. No associated pelvic or vaginal pain. Not sexually active. Uses premarin cream intermittently, otherwise no vaginal creams. Not on any hormonal therapy. On aggrenox (dipyridamole-ASA) with history of carotid bruit, hasn't taken in the last few days. Used to wear a pessary and would have some bleeding with this, however has not worn this since 03/25/18. Recent CBC hgb 11.8 in 04/2021, baseline around 10-11 chronically. Denies any lightheadedness, dizziness, fatigue, or fever.   [place] in person interpreter used for duration of visit.     Past Medical History:  Diagnosis Date   Arthritis    Blood in stool    with rectal pain    Cancer ([place])    skin cancer -    Cataract    bilateral eyes removed   Colon polyp    Complication of anesthesia    "years ago, hard to awaken, no with recent procedures."   Diverticulosis    Gallstones    GERD (gastroesophageal reflux disease)    Headache    HTN (hypertension)    Hyperlipidemia    Irritable bowel syndrome (IBS)    Osteoarthritis    Osteopenia    Osteoporosis    Uterine prolapse     Past Surgical History:  Procedure Laterality Date   bilateral cataracts     COLONOSCOPY     INGUINAL HERNIA REPAIR Bilateral 09/13/2020    Procedure: LAPAROSCOPIC BILATERAL INGUINAL HERNIA REPAIR WITH MESH;  Surgeon: Ralene Ok, MD;  Location: [facility];  Service: General;  Laterality: Bilateral;   POLYPECTOMY     uterine prolapse surgery      The following portions of the patient's history were reviewed and updated as appropriate: allergies, current medications, past family history, past medical history, past social history, past surgical history and problem list.   Health Maintenance: Normal mammogram on 03/15/2020.  Review of Systems:  Pertinent items noted in HPI and remainder of comprehensive ROS otherwise negative.  Physical Exam:  BP 137/77   Pulse 61  CONSTITUTIONAL: Well-developed, well-nourished female in no acute distress.  HEENT:  Normocephalic, atraumatic NECK: Normal range of motion, supple SKIN: No rash noted. Not diaphoretic. No erythema. No pallor. MUSCULOSKELETAL: Normal range of motion. PSYCHIATRIC: Normal mood and affect. Normal behavior.  RESPIRATORY: Effort normal, no problems with respiration noted ABDOMEN: No masses noted. No other overt distention noted.   PELVIC: Normal appearing external genitalia; normal urethral meatus; normal appearing vaginal mucosa and cervix. Scant amount of dried blood present at cervical os. No abnormal discharge. Cytocele noted.  Normal uterine size, no other palpable masses, no uterine, cervical, or adnexal tenderness. Performed in the presence of a chaperone  Labs and Imaging No results found for this or any previous visit (from the past 168 hour(s)). No results found.  Assessment and Plan:  1. Postmenopausal bleeding Confirmed with dried blood noted at cervical os. Unclear etiology, ddx including endometrial hyperplasia/malignancy, atrophy, polyps. Less likely related to GU trauma with chair bike at PT. On anti-platelet only which may be contributing. Eval with U/S and endometrial biopsy as below, discussed procedure and recommended tylenol prior to.  - US PELVIC  COMPLETE WITH TRANSVAGINAL; Future   Please refer to After Visit Summary for other counseling recommendations.   Return for next available for endometrial biopsy. ED precautions discussed.      Darrelyn Hillock, DO  OB Fellow, [facility] 07/15/2021 2:57 PM

## 2021-07-15 NOTE — Patient Instructions (Addendum)
It was wonderful to see you today!   We will be getting an ultrasound scheduled and recommend an endometrial biopsy. Please continue to monitor bleeding. Recommend taking tylenol '1000mg'$  1 hour prior to the procedure.   If any lightheadedness, dizziness, or heavy vaginal bleeding--please be seen before your appointment.

## 2021-07-17 ENCOUNTER — Ambulatory Visit: Payer: Medicare Other | Attending: Internal Medicine | Admitting: Physical Therapy

## 2021-07-17 ENCOUNTER — Other Ambulatory Visit: Payer: Self-pay

## 2021-07-17 ENCOUNTER — Encounter: Payer: Self-pay | Admitting: Physical Therapy

## 2021-07-17 DIAGNOSIS — R2681 Unsteadiness on feet: Secondary | ICD-10-CM | POA: Diagnosis not present

## 2021-07-17 DIAGNOSIS — R2689 Other abnormalities of gait and mobility: Secondary | ICD-10-CM | POA: Diagnosis not present

## 2021-07-17 DIAGNOSIS — M6281 Muscle weakness (generalized): Secondary | ICD-10-CM | POA: Insufficient documentation

## 2021-07-18 ENCOUNTER — Ambulatory Visit (INDEPENDENT_AMBULATORY_CARE_PROVIDER_SITE_OTHER): Payer: Medicare Other | Admitting: Nurse Practitioner

## 2021-07-18 ENCOUNTER — Encounter: Payer: Self-pay | Admitting: Nurse Practitioner

## 2021-07-18 VITALS — BP 148/88 | HR 64 | Ht 60.0 in | Wt 131.0 lb

## 2021-07-18 DIAGNOSIS — K6289 Other specified diseases of anus and rectum: Secondary | ICD-10-CM

## 2021-07-18 MED ORDER — HYDROCORTISONE (PERIANAL) 2.5 % EX CREA: 1.0000 "application " | TOPICAL_CREAM | Freq: Every day | CUTANEOUS | 0 refills | Status: DC

## 2021-07-18 NOTE — Progress Notes (Signed)
ASSESSMENT AND PLAN    # 87 year old female with chronic, constant rectal discomfort.  Flexible sigmoidoscopy findings of internal / external hemorrhoids in 2018 ( done to evaluate rectal pain and anterior rectal wall lesion felt on rectal exam).  Calcified uterine lesion on pelvic US corresponded to rectal exam findings. The rectal pain was felt to be secondary to her pessary and/or calcified uterine lesion.  Patient returns with a 2-year history of recurrent rectal discomfort.  She is worried because her mother and sister had colorectal cancer.  No obvious masses on DRE /  anoscopy today but stool in the vault precluded good visualization.  Thickened hemorrhoidal tissue seen on anterior wall. Patient has not had any blood in her stool.  She is managing her chronic constipation with plums and MiraLAX as needed.  --I tried to provide reassurance through the interpreter.  No masses/cancer found on flexible sigmoidoscopy in 2018 for the same rectal pain.  --Anusol cream per rectum x10 days --Follow-up with me in about 4 weeks    HISTORY OF PRESENT ILLNESS     Chief Complaint : Abdominal pain, rectal pain, constipation, family history of rectal cancer  Sophia Santos is a 87 y.o. female with a past medical history significant for hyperlipidemia, hypertension, osteoporosis , osteoarthritis , GERD , diverticulosis, chronic constipation,  anxiety.  See PMH below for any additional history.   **[place] interpreter present  Patient was seen here in 2018 by Dr. Ardis Hughs in October 2018 for evaluation of lower abdominal pain and rectal discomfort .  She had a colonoscopy 2 years prior with a good retroflex examination .  On Dr. Eugenia Pancoast exam there was a firmness on the anterior wall of her rectum .  He had concern for possible vaginal/uterine lesion so transvaginal ultrasound was ordered and revealing of a calcified fibroid in her uterus.  US findings felt to correlate with rectal exam. However, given  patient's pain she was scheduled her for a flexible sigmoidoscopy which showed only internal/external hemorrhoids and diverticulosis.  INTERVAL HISTORY:  Patient referred by PCP for evaluation of IBS but her only complaint is that of constant rectal discomfort.  She says the rectal discomfort started about 2 years ago. Pain is mild but constant.when having a bowel movement she feels like something is "pushing down" in her rectum. She has not had any rectal bleeding. She has chronic constipation but seems to be managing well with plums mixed with warm water and MiraLAX as needed. She is concerned about the rectal pain because her mother and sister had colorectal cancer.   In addition to rectal pain patient complains of mid lower abdominal discomfort with standing, improved by sitting or lying down.  Pain not relieved by bowel movements.  Dicyclomine helps the discomfort. No urinary complaints.  She says that her pessary was removed a couple of years ago.  She saw GYN a few days ago for evaluation of vaginal bleeding and was found to have dried blood at the cervical os.  She is being scheduled for an ultrasound and an endometrial biopsy.    She had bilateral inguinal hernia repair with mesh September 2021  Patient has a history of GERD.  She takes omeprazole which helps her symptoms though sometimes she requires a second dose later in the day.    Data Reviewed: May 06, 2021 CMP unremarkable Hemoglobin 11.8 (at baseline), MCV 95, platelets 231 TSH 139   PREVIOUS GI EVALUATIONS:    October 2016 screening colonoscopy --  Numerous medium to large diverticulum throughout the left colon.  Closely associated with 1 diverticulum at 35 cm from anus and sigmoid segment was a 1 cm polypoid nodule and adjacent abnormal mucosa.  The mucosa was biopsied, nodule removed.  Examination otherwise normal  Surgical [P], sigmoid irregular mucosa near diverticulum - BENIGN COLORECTAL MUCOSA. - ASSOCIATED BENIGN  LYMPHOID AGGREGATE. - NO EVIDENCE OF SIGNIFICANT INFLAMMATION, DYSPLASIA, OR MALIGNANCY IDENTIFIED. - SEE COMMENT. 2. Surgical [P], sigmoid, polyp - INFLAMMATORY-TYPE POLYP, TWO FRAGMENTS. - NO DYSPLASIA, ATYPIA OR MALIGNANCY IDENTIFIED. - SEE COMMENT. Microscopic Comment 1. Lesional tissue is not identified within the first specimen. Please correlate with clinical and endoscopic impression. 2. Sections of the sigmoid polyp demonstrate an acute and chronically inflamed granulation tissue-type polyp consistent with an inflammatory polyp.   December 2018 flexible sigmoidoscopy for evaluation of abnormal rectal exam. --Hemorrhoids and diverticulosis   Past Medical History:  Diagnosis Date   Arthritis    Blood in stool    with rectal pain    Cataract    bilateral eyes removed   Colon polyp    Complication of anesthesia    "years ago, hard to awaken, no with recent procedures."   Diverticulosis    Gallstones    GERD (gastroesophageal reflux disease)    Headache    HTN (hypertension)    Hyperlipidemia    Irritable bowel syndrome (IBS)    Osteoarthritis    Osteopenia    Osteoporosis    Skin cancer    skin cancer -    Uterine prolapse      Past Surgical History:  Procedure Laterality Date   bilateral cataracts     COLONOSCOPY     INGUINAL HERNIA REPAIR Bilateral 09/13/2020   Procedure: LAPAROSCOPIC BILATERAL INGUINAL HERNIA REPAIR WITH MESH;  Surgeon: Ralene Ok, MD;  Location: [facility];  Service: General;  Laterality: Bilateral;   uterine prolapse surgery     Family History  Problem Relation Age of Onset   Colon cancer Mother 67       colorectal   Stroke Mother    Parkinson's disease Father    Dementia Father    Colon cancer Sister        colorectal   Colon polyps Sister    Coronary artery disease Other        female 1st degree <50   Esophageal cancer Neg Hx    Stomach cancer Neg Hx    Social History   Tobacco Use   Smoking status: Never   Smokeless  tobacco: Never  Vaping Use   Vaping Use: Never used  Substance Use Topics   Alcohol use: No   Drug use: No   Current Outpatient Medications  Medication Sig Dispense Refill   acetaminophen (TYLENOL) 325 MG tablet Take 352 mg by mouth every 6 (six) hours as needed for mild pain or moderate pain.     ALPRAZolam (XANAX) 1 MG tablet Take 0.5-1 tablets (0.5-1 mg total) by mouth at bedtime as needed for anxiety or sleep. 90 tablet 1   captopril (CAPOTEN) 50 MG tablet TAKE 1 TABLET BY MOUTH TWICE A DAY 180 tablet 3   Cholecalciferol 1000 UNITS tablet Take 1,000 Units by mouth daily.     conjugated estrogens (PREMARIN) vaginal cream USE VAGINALLY once or TWICE WEEKLY 30 g 3   dicyclomine (BENTYL) 10 MG capsule Take 1 capsule (10 mg total) by mouth 3 (three) times daily as needed for spasms. (Patient taking differently: Take 10 mg by mouth  3 (three) times daily before meals.) 270 capsule 2   dipyridamole-aspirin (AGGRENOX) 200-25 MG 12hr capsule Take 1 capsule by mouth 2 (two) times daily. 180 capsule 3   ezetimibe (ZETIA) 10 MG tablet TAKE 1 TABLET BY MOUTH EVERY DAY 90 tablet 3   MYRBETRIQ 25 MG TB24 tablet Take 1 tablet (25 mg total) by mouth daily. 90 tablet 3   Omega 3 1000 MG CAPS Take 1,000 mg by mouth daily.     omeprazole (PRILOSEC) 20 MG capsule Take 2 capsules (40 mg total) by mouth daily. Annual appt due in March must see provider for future refills 180 capsule 3   Pancrelipase, Lip-Prot-Amyl, (CREON) 24000-76000 units CPEP TAKE ONE CAPSULE BY MOUTH 3 TIMES A DAY WITH FOOD 270 capsule 3   polyethylene glycol powder (GLYCOLAX/MIRALAX) 17 GM/SCOOP powder MIX 17 GM AS DIRECTED AND DRINK TWICE DAILY AS NEEDED FOR MODERATE CONSTIPATION (Patient taking differently: Take 17 g by mouth in the morning and at bedtime.) 527 g 5   propranolol ER (INDERAL LA) 120 MG 24 hr capsule Take 1 capsule (120 mg total) by mouth daily. 90 capsule 3   RESTASIS 0.05 % ophthalmic emulsion Place 1 drop into both eyes 2  (two) times daily.      topiramate (TOPAMAX) 50 MG tablet Take 50 mg by mouth at bedtime.   1   Vitamin A 2400 MCG (8000 UT) CAPS Take 2,400 mcg by mouth daily.     No current facility-administered medications for this visit.   Allergies  Allergen Reactions   Fosamax [Alendronate Sodium]     pains   Lipitor [Atorvastatin]     arthralgias   Voltaren [Diclofenac Sodium]     Skin rash from gel   Zocor [Simvastatin]     REACTION: cramp     Review of Systems: Positive for arthritis, back pain, vision changes, fatigue, skin rash, swelling of feet and legs, excessive urination (if she drinks a lot of water).  All other systems reviewed and negative except where noted in HPI.    PHYSICAL EXAM :    Wt Readings from Last 3 Encounters:  07/18/21 131 lb (59.4 kg)  05/06/21 129 lb 6.4 oz (58.7 kg)  02/04/21 127 lb 6.4 oz (57.8 kg)    Ht 5' (1.524 m) Comment: height measured without shoes  Wt 131 lb (59.4 kg)   BMI 25.58 kg/m  Constitutional:  Pleasant female in no acute distress. Psychiatric: Normal mood and affect. Behavior is normal. EENT: Pupils normal.  Conjunctivae are normal. No scleral icterus. Neck supple.  Cardiovascular: Normal rate, regular rhythm. No edema Pulmonary/chest: Effort normal and breath sounds normal. No wheezing, rales or rhonchi. Abdominal: Soft, nondistended, nontender. Bowel sounds active throughout. There are no masses palpable. No hepatomegaly. Rectal: Small, mildly inflamed protruding internal hemorrhoids.  On DRE there was soft stool in the vault.  No masses seen on anoscopy but not great visualization due to the presence of stool. Neurological: Alert and oriented to person place and time. Skin: Skin is warm and dry. No rashes noted.  Tye Savoy, NP  07/18/2021, 2:17 PM  Cc:  Referring Provider Plotnikov, Evie Lacks, MD

## 2021-07-18 NOTE — Patient Instructions (Signed)
We have sent Anusol Cream to your pharmacy, use nightly per rectum for 10 nights  If you are age 85 or older, your body mass index should be between 23-30. Your Body mass index is 25.58 kg/m. If this is out of the aforementioned range listed, please consider follow up with your Primary Care Provider.  If you are age 25 or younger, your body mass index should be between 19-25. Your Body mass index is 25.58 kg/m. If this is out of the aformentioned range listed, please consider follow up with your Primary Care Provider.   __________________________________________________________  The [facility] providers would like to encourage you to use [facility] to communicate with providers for non-urgent requests or questions.  Due to long hold times on the telephone, sending your provider a message by [facility] may be a faster and more efficient way to get a response.  Please allow 48 business hours for a response.  Please remember that this is for non-urgent requests.    I appreciate the  opportunity to care for you  Thank You   [place]

## 2021-07-18 NOTE — Therapy (Signed)
[facility] [address] [place], [place], [address] Phone: [phone]   Fax:  [phone]  Physical Therapy Treatment  Patient Details  Name: Sophia Santos MRN: [id number] Date of Birth: [birth year] Referring Provider (PT): Dr. Alain Marion   Encounter Date: 07/17/2021   PT End of Session - 07/17/21 1242     Visit Number 11    Number of Visits 17    Date for PT Re-Evaluation 07/29/21    Authorization Type [employer] medicare/MCD secondary    Progress Note Due on Visit 20    PT Start Time 1235    PT Stop Time 1314    PT Time Calculation (min) 39 min    Equipment Utilized During Treatment Gait belt    Activity Tolerance Patient tolerated treatment well    Behavior During Therapy WFL for tasks assessed/performed             Past Medical History:  Diagnosis Date   Arthritis    Blood in stool    with rectal pain    Cancer ([place])    skin cancer -    Cataract    bilateral eyes removed   Colon polyp    Complication of anesthesia    "years ago, hard to awaken, no with recent procedures."   Diverticulosis    Gallstones    GERD (gastroesophageal reflux disease)    Headache    HTN (hypertension)    Hyperlipidemia    Irritable bowel syndrome (IBS)    Osteoarthritis    Osteopenia    Osteoporosis    Uterine prolapse     Past Surgical History:  Procedure Laterality Date   bilateral cataracts     COLONOSCOPY     INGUINAL HERNIA REPAIR Bilateral 09/13/2020   Procedure: LAPAROSCOPIC BILATERAL INGUINAL HERNIA REPAIR WITH MESH;  Surgeon: Ralene Ok, MD;  Location: [facility];  Service: General;  Laterality: Bilateral;   POLYPECTOMY     uterine prolapse surgery      There were no vitals filed for this visit.   Subjective Assessment - 07/17/21 1239     Subjective Saw her MD the other day about post menopausal bleeding. Was advised to continue with PT, however not to do anything strenous for now. "no bike". No other  specific instructions.    Patient is accompained by: Interpreter    Pertinent History B knee DJD and effusion, lumbar spondylosis and scoliosis    Limitations Standing    How long can you sit comfortably? unlimited    How long can you stand comfortably? 5 min    How long can you walk comfortably? <15 min    Patient Stated Goals To walk better    Currently in Pain? No/denies    Pain Score 0-No pain                     [facility] Adult PT Treatment/Exercise - 07/17/21 1243       Transfers   Transfers Sit to Stand;Stand to Sit    Sit to Stand 6: Modified independent (Device/Increase time)    Stand to Sit 6: Modified independent (Device/Increase time)      Ambulation/Gait   Ambulation/Gait Yes    Ambulation/Gait Assistance 5: Supervision    Ambulation/Gait Assistance Details verbal cues with gait indoors for reciprocal arm swing and posture. no balance issues noted. worked on scanning the enviroment randomly with gait with no veering or decreased gait speed noted.    Ambulation Distance (Feet)  250 Feet   x1 indoors, plus around clinic with session   Assistive device None    Gait Pattern Step-through pattern;Trunk flexed    Ambulation Surface Level;Indoor   pt did not want to go outside due to sun and skin sensitivity. will save this for a morning appt     High Level Balance   High Level Balance Activities Side stepping;Tandem walking;Marching forwards;Marching backwards   tandem/toe/heel walking foward/backward   High Level Balance Comments red/blue mats next to counter top: 3 laps each/each way with intermittent support on counter top, min guard to occasional min assist with loss of balance. cues on posture and ex form/technique.                 Balance Exercises - 07/17/21 1303       Balance Exercises: Standing   Standing Eyes Closed Narrow base of support (BOS);Wide (BOA);Head turns;Foam/compliant surface;Other reps (comment);30 secs;Limitations    Standing Eyes  Closed Limitations on airex with no UE support: feet together for EC 30 sec's x 3 reps, then feet apart for EC head movements left<>right, up<>down for ~7-8 reps each. min guard to min assist for balance with cues on posture and weight shifting to assist with balance.                 PT Short Term Goals - 07/02/21 1323       PT SHORT TERM GOAL #1   Title Patient to demonstarte independence in initial HEP    Baseline 05/28/21 initial HEP issued    Status Achieved    Target Date 07/01/21      PT SHORT TERM GOAL #2   Title Assess TUG and set appropreiate goal    Baseline 05/28/21 TUG score 12.28s, no goal needed    Status Achieved      PT SHORT TERM GOAL #3   Title Assess DGI and set appropriate goal    Baseline 06/20/21 DGI score 21, no goal needed    Status Achieved      PT SHORT TERM GOAL #4   Title Patient to negotiate 4 steps with single rail and most appropriate pattern    Baseline 07/02/21: met in session today with single rail with alternating pattern    Status Achieved    Target Date --      PT SHORT TERM GOAL #5   Title Ambulate 245f with LRAD under S    Baseline 06/20/21: Ambulation distance [id number] previous session    Status Achieved    Target Date 07/01/21               PT Long Term Goals - 07/17/21 1242       PT LONG TERM GOAL #1   Title Patient I in final HEP. (all LTGS due 07/29/21)    Baseline TBD    Time 8    Period Weeks    Status On-going      PT LONG TERM GOAL #2   Title Assess progress towards DGI    Baseline TBD; score of 21, no goal needed    Status Achieved      PT LONG TERM GOAL #3   Title Increase B hip abduction to 4/5    Baseline B hip abduction 4/5 sterngth B    Time 8    Period Weeks    Status New      PT LONG TERM GOAL #4   Title Patient to ambulate 5005fith LRAD across outside  terrain    Baseline 174f with CGA over level surfaces    Time 8    Period Weeks    Status On-going      PT LONG TERM GOAL #5   Title  Assess progress towards TUG    Baseline TBD; 06/20/21 initial TUG time 13s, no goal needed    Status Achieved      PT LONG TERM GOAL #6   Title Patient to negotiate 16 steps with most appropriate pattern and UE support    Baseline 4 steps with B rails and step to pattern    Time 8    Period Weeks    Status On-going      PT LONG TERM GOAL #7   Title Patient to increase gait speed to >0.653m    Baseline Initial gait velacity 0.5452m   Time 8    Period Weeks    Status On-going                   Plan - 07/17/21 1242     Personal Factors and Comorbidities Comorbidity 1    Comorbidities OA in knees/spine    Examination-Activity Limitations Locomotion Level;Stairs;Transfers    Stability/Clinical Decision Making Stable/Uncomplicated    Rehab Potential Good    PT Frequency 2x / week    PT Duration 8 weeks    PT Treatment/Interventions ADLs/Self Care Home Management;Aquatic Therapy;DME Instruction;Gait training;Stair training;Functional mobility training;Therapeutic activities;Therapeutic exercise;Balance training;Neuromuscular re-education;Patient/family education    PT Next Visit Plan continue core and trunk strengthening for improved posture with gait, increase resistance on aerobic tasks and monitor posture/gait velocity, continue to challenge core and balance activities incorporating postural control, add stepping tasks to HEP as indicated    PT Home Exercise Plan [email or web address]    Consulted and Agree with Plan of Care Patient;Other (Comment)   interpretter            Patient will benefit from skilled therapeutic intervention in order to improve the following deficits and impairments:  Abnormal gait, Difficulty walking, Decreased endurance, Decreased activity tolerance, Decreased mobility, Decreased strength  Visit Diagnosis: Other abnormalities of gait and mobility  Unsteadiness on feet  Muscle weakness (generalized)     Problem List Patient Active Problem List    Diagnosis Date Noted   S/P hernia repair 09/13/2020   Gallstones 03/13/2020   Bruising 02/23/2020   Overactive bladder 10/25/2019   Inguinal hernia 03/02/2018   Urinary tract infection 02/03/2017   Well adult exam 11/04/2016   IBS (irritable bowel syndrome) 11/04/2016   Pancreatic insufficiency 03/03/2016   Headache 12/03/2015   Elevated alkaline phosphatase level 08/03/2015   Onychomycosis 08/03/2015   Insomnia 04/02/2015   Indigestion 04/02/2015   Chest pain, atypical 12/26/2014   Pain in left hip 04/13/2014   Snoring 11/09/2012   Skin cancer 11/09/2012   Mass of ear auricle 08/09/2012   Actinic keratoses 08/09/2012   Low back pain 10/28/2011   Preop examination 10/28/2011   Uterine prolaps 10/09/2011   Atrophic vaginitis 08/29/2011   Knee pain, left 07/25/2011   Anxiety disorder 05/31/2010   Constipation 05/02/2010   Abnormal CBC 07/31/2009   Abdominal pain 08/07/2008   ABNORMAL GLUCOSE NEC 08/07/2008   ABDOMINAL XRAY, ABNORMAL 08/07/2008   CAROTID BRUIT, RIGHT 02/22/2008   Dyslipidemia 11/21/2007   OSTEOARTHRITIS 11/21/2007   OSTEOPENIA 11/21/2007   Migraines 11/15/2007   Essential hypertension 09/06/2007   GERD 09/06/2007   PALPITATIONS, HX OF 09/06/2007    KatWillow OraTA, CLT Outpatient  [facility] [address] [place], [place] [address] [phone] 07/18/21, 1:08 PM   Name: Airi Copado MRN: [id number] Date of Birth: 07-19-1934

## 2021-07-19 ENCOUNTER — Ambulatory Visit (HOSPITAL_COMMUNITY)
Admission: RE | Admit: 2021-07-19 | Discharge: 2021-07-19 | Disposition: A | Discharge status: Home / Self Care | Payer: Medicare Other | Source: Ambulatory Visit | Attending: Family Medicine | Admitting: Family Medicine

## 2021-07-19 ENCOUNTER — Other Ambulatory Visit: Payer: Self-pay

## 2021-07-19 DIAGNOSIS — N950 Postmenopausal bleeding: Secondary | ICD-10-CM | POA: Diagnosis present

## 2021-07-22 NOTE — Progress Notes (Signed)
I agree with the above note, plan 

## 2021-07-25 ENCOUNTER — Other Ambulatory Visit: Payer: Self-pay

## 2021-07-25 ENCOUNTER — Ambulatory Visit: Payer: Medicare Other

## 2021-07-25 DIAGNOSIS — R2681 Unsteadiness on feet: Secondary | ICD-10-CM

## 2021-07-25 DIAGNOSIS — R2689 Other abnormalities of gait and mobility: Secondary | ICD-10-CM

## 2021-07-25 DIAGNOSIS — M6281 Muscle weakness (generalized): Secondary | ICD-10-CM

## 2021-07-25 NOTE — Therapy (Signed)
[facility] [address] [place], [place], [address] Phone: [phone]   Fax:  [phone]  Physical Therapy Treatment  Patient Details  Name: Sophia Santos MRN: [id number] Date of Birth: Jun 29, 1934 Referring Provider (PT): Dr. Alain Marion   Encounter Date: 07/25/2021   PT End of Session - 07/25/21 1539     Visit Number 12    Number of Visits 17    Date for PT Re-Evaluation 07/29/21    Authorization Type UHC medicare/MCD secondary    Progress Note Due on Visit 76    PT Start Time 1535    PT Stop Time 1615    PT Time Calculation (min) 40 min    Equipment Utilized During Treatment Gait belt    Activity Tolerance Patient tolerated treatment well    Behavior During Therapy WFL for tasks assessed/performed             Past Medical History:  Diagnosis Date   Arthritis    Blood in stool    with rectal pain    Cataract    bilateral eyes removed   Colon polyp    Complication of anesthesia    "years ago, hard to awaken, no with recent procedures."   Diverticulosis    Gallstones    GERD (gastroesophageal reflux disease)    Headache    HTN (hypertension)    Hyperlipidemia    Irritable bowel syndrome (IBS)    Osteoarthritis    Osteopenia    Osteoporosis    Skin cancer    skin cancer -    Uterine prolapse     Past Surgical History:  Procedure Laterality Date   bilateral cataracts     COLONOSCOPY     INGUINAL HERNIA REPAIR Bilateral 09/13/2020   Procedure: LAPAROSCOPIC BILATERAL INGUINAL HERNIA REPAIR WITH MESH;  Surgeon: Ralene Ok, MD;  Location: [facility];  Service: General;  Laterality: Bilateral;   uterine prolapse surgery      There were no vitals filed for this visit.   Subjective Assessment - 07/25/21 1537     Subjective Bleeding has resoved but remains apprehensive about using stepper.    Patient is accompained by: Interpreter    Pertinent History B knee DJD and effusion, lumbar spondylosis  and scoliosis    Limitations Standing    How long can you sit comfortably? unlimited    How long can you stand comfortably? 5 min    How long can you walk comfortably? <15 min    Patient Stated Goals To walk better                               [facility] Adult PT Treatment/Exercise - 07/25/21 0001       Transfers   Transfers Sit to Stand;Stand to Sit    Sit to Stand 6: Modified independent (Device/Increase time)    Stand to Sit 6: Modified independent (Device/Increase time)    Number of Reps 10 reps    Comments from airex, 5x w/arms, 5x w/o with spacer used to limit adduction and bracing      Ambulation/Gait   Ambulation/Gait Yes    Ambulation/Gait Assistance 5: Supervision    Ambulation Distance (Feet) 230 Feet    Assistive device None    Gait Pattern Step-through pattern;Trunk flexed    Ambulation Surface Level;Indoor      Posture/Postural Control   Posture/Postural Control Postural limitations    Posture Comments Added  lumbar extension over physiobal, 2x10 arms crossed      Lumbar Exercises: Seated   Other Seated Lumbar Exercises seated core exercises of latissimus pressdowns on foam roll 15x    Other Seated Lumbar Exercises seated core exercises of hip tosses, shoulder tosses, chops, Vs, all for 12 reps with 3.3# ball      Knee/Hip Exercises: Seated   Long Arc Quad Strengthening;Both;15 reps;Weights;Limitations    Long Arc Quad Weight 3 lbs.    Long [employer] Limitations performed with lat press, alternating    Heel Slides Strengthening;Both;1 set;15 reps;Limitations    Heel Slides Limitations Alt. with 3# using towel                      PT Short Term Goals - 07/02/21 1323       PT SHORT TERM GOAL #1   Title Patient to demonstarte independence in initial HEP    Baseline 05/28/21 initial HEP issued    Status Achieved    Target Date 07/01/21      PT SHORT TERM GOAL #2   Title Assess TUG and set appropreiate goal    Baseline 05/28/21  TUG score 12.28s, no goal needed    Status Achieved      PT SHORT TERM GOAL #3   Title Assess DGI and set appropriate goal    Baseline 06/20/21 DGI score 21, no goal needed    Status Achieved      PT SHORT TERM GOAL #4   Title Patient to negotiate 4 steps with single rail and most appropriate pattern    Baseline 07/02/21: met in session today with single rail with alternating pattern    Status Achieved    Target Date --      PT SHORT TERM GOAL #5   Title Ambulate 228f with LRAD under S    Baseline 06/20/21: Ambulation distance 3486fon previous session    Status Achieved    Target Date 07/01/21               PT Long Term Goals - 07/17/21 1242       PT LONG TERM GOAL #1   Title Patient I in final HEP. (all LTGS due 07/29/21)    Baseline TBD    Time 8    Period Weeks    Status On-going      PT LONG TERM GOAL #2   Title Assess progress towards DGI    Baseline TBD; score of 21, no goal needed    Status Achieved      PT LONG TERM GOAL #3   Title Increase B hip abduction to 4/5    Baseline B hip abduction 4/5 sterngth B    Time 8    Period Weeks    Status New      PT LONG TERM GOAL #4   Title Patient to ambulate 50033fith LRAD across outside terrain    Baseline 125f22fth CGA over level surfaces    Time 8    Period Weeks    Status On-going      PT LONG TERM GOAL #5   Title Assess progress towards TUG    Baseline TBD; 06/20/21 initial TUG time 13s, no goal needed    Status Achieved      PT LONG TERM GOAL #6   Title Patient to negotiate 16 steps with most appropriate pattern and UE support    Baseline 4 steps with B rails and step  to pattern    Time 8    Period Weeks    Status On-going      PT LONG TERM GOAL #7   Title Patient to increase gait speed to >0.76ms    Baseline Initial gait velacity 0.51m    Time 8    Period Weeks    Status On-going                   Plan - 07/25/21 1544     Clinical Impression Statement Patient returns to PT  following a bout of illnsess, bleeding has resolved but is apprehensive about returning to stepper.  Resumed weighted core and LE exercises and balance and posture training, transfer training.  No increased weight or resistance added as this was her first session following her illness.  Posture improved during ambulation following extension tasks.    Personal Factors and Comorbidities Comorbidity 1    Comorbidities OA in knees/spine    Examination-Activity Limitations Locomotion Level;Stairs;Transfers    Stability/Clinical Decision Making Stable/Uncomplicated    Rehab Potential Good    PT Frequency 2x / week    PT Duration 8 weeks    PT Treatment/Interventions ADLs/Self Care Home Management;Aquatic Therapy;DME Instruction;Gait training;Stair training;Functional mobility training;Therapeutic activities;Therapeutic exercise;Balance training;Neuromuscular re-education;Patient/family education    PT Next Visit Plan continue core and trunk strengthening for improved posture with gait, monitor posture/gait velocity, continue to challenge core and balance activities incorporating postural control, add stepping tasks to HEP as indicated    PT Home Exercise Plan [email or web address]    Consulted and Agree with Plan of Care Patient;Other (Comment)   interpretter            Patient will benefit from skilled therapeutic intervention in order to improve the following deficits and impairments:  Abnormal gait, Difficulty walking, Decreased endurance, Decreased activity tolerance, Decreased mobility, Decreased strength  Visit Diagnosis: Other abnormalities of gait and mobility  Unsteadiness on feet  Muscle weakness (generalized)     Problem List Patient Active Problem List   Diagnosis Date Noted   S/P hernia repair 09/13/2020   Gallstones 03/13/2020   Bruising 02/23/2020   Overactive bladder 10/25/2019   Inguinal hernia 03/02/2018   Urinary tract infection 02/03/2017   Well adult exam 11/04/2016   IBS  (irritable bowel syndrome) 11/04/2016   Pancreatic insufficiency 03/03/2016   Headache 12/03/2015   Elevated alkaline phosphatase level 08/03/2015   Onychomycosis 08/03/2015   Insomnia 04/02/2015   Indigestion 04/02/2015   Chest pain, atypical 12/26/2014   Pain in left hip 04/13/2014   Snoring 11/09/2012   Skin cancer 11/09/2012   Mass of ear auricle 08/09/2012   Actinic keratoses 08/09/2012   Low back pain 10/28/2011   Preop examination 10/28/2011   Uterine prolaps 10/09/2011   Atrophic vaginitis 08/29/2011   Knee pain, left 07/25/2011   Anxiety disorder 05/31/2010   Constipation 05/02/2010   Abnormal CBC 07/31/2009   Abdominal pain 08/07/2008   ABNORMAL GLUCOSE NEC 08/07/2008   ABDOMINAL XRAY, ABNORMAL 08/07/2008   CAROTID BRUIT, RIGHT 02/22/2008   Dyslipidemia 11/21/2007   OSTEOARTHRITIS 11/21/2007   OSTEOPENIA 11/21/2007   Migraines 11/15/2007   Essential hypertension 09/06/2007   GERD 09/06/2007   PALPITATIONS, HX OF 09/06/2007    JeLanice Shirts/10/2021, 4:17 PM  [id number]: [phone] Fax:  [phone]: Sophia SharpsRN: [id number] of Birth: 02/1934-09-22

## 2021-07-26 ENCOUNTER — Ambulatory Visit: Payer: Medicare Other

## 2021-07-26 DIAGNOSIS — M6281 Muscle weakness (generalized): Secondary | ICD-10-CM

## 2021-07-26 DIAGNOSIS — R2681 Unsteadiness on feet: Secondary | ICD-10-CM | POA: Diagnosis not present

## 2021-07-26 DIAGNOSIS — R2689 Other abnormalities of gait and mobility: Secondary | ICD-10-CM | POA: Diagnosis not present

## 2021-07-26 NOTE — Therapy (Addendum)
[facility] [address] [place], [place], [address] Phone: [phone]   Fax:  [phone]  Physical Therapy Treatment/DC [facility]  Patient Details  Name: Sophia Santos MRN: [id number] Date of Birth: [birth year] Referring Provider (PT): Dr. Alain Marion   Encounter Date: 07/26/2021 PHYSICAL THERAPY DISCHARGE SUMMARY  Visits from Start of Care: 13  Current functional level related to goals / functional outcomes: UTA   Remaining deficits: UTA   Education / Equipment: HEP   Patient agrees to discharge. Patient goals were partially met. Patient is being discharged due to a change in medical status.   PT End of Session - 07/26/21 1227     Visit Number 13    Number of Visits 17    Date for PT Re-Evaluation 07/29/21    Authorization Type UHC medicare/MCD secondary    Progress Note Due on Visit 43    PT Start Time 43    PT Stop Time 1315    PT Time Calculation (min) 45 min    Equipment Utilized During Treatment Gait belt    Activity Tolerance Patient tolerated treatment well    Behavior During Therapy WFL for tasks assessed/performed             Past Medical History:  Diagnosis Date   Arthritis    Blood in stool    with rectal pain    Cataract    bilateral eyes removed   Colon polyp    Complication of anesthesia    "years ago, hard to awaken, no with recent procedures."   Diverticulosis    Gallstones    GERD (gastroesophageal reflux disease)    Headache    HTN (hypertension)    Hyperlipidemia    Irritable bowel syndrome (IBS)    Osteoarthritis    Osteopenia    Osteoporosis    Skin cancer    skin cancer -    Uterine prolapse     Past Surgical History:  Procedure Laterality Date   bilateral cataracts     COLONOSCOPY     INGUINAL HERNIA REPAIR Bilateral 09/13/2020   Procedure: LAPAROSCOPIC BILATERAL INGUINAL HERNIA REPAIR WITH MESH;  Surgeon: Ralene Ok, MD;  Location: [facility];  Service:  General;  Laterality: Bilateral;   uterine prolapse surgery      There were no vitals filed for this visit.   Subjective Assessment - 07/26/21 1232     Subjective Reports she is able to walk farther with better posture, prefers to [facility] using rollator for purpose of a seat and ability to carry objects.    Patient is accompained by: Interpreter    Pertinent History B knee DJD and effusion, lumbar spondylosis and scoliosis    Limitations Standing    How long can you sit comfortably? unlimited    How long can you stand comfortably? 5 min    How long can you walk comfortably? <15 min    Patient Stated Goals To walk better    Pain Onset More than a month ago                               [facility] Adult PT Treatment/Exercise - 07/26/21 0001       Transfers   Transfers Sit to Stand;Stand to Sit    Sit to Stand 6: Modified independent (Device/Increase time)    Stand to Sit 6: Modified independent (Device/Increase time)    Number of Reps 10 reps  Comments from airex for all 10 reps      Ambulation/Gait   Ambulation/Gait Yes    Ambulation/Gait Assistance 5: Supervision    Ambulation Distance (Feet) 100 Feet    Assistive device None    Gait Pattern Step-through pattern;Trunk flexed    Ambulation Surface Level;Indoor      Lumbar Exercises: Seated   Other Seated Lumbar Exercises seated core exercises of latissimus pressdowns on foam roll 15x    Other Seated Lumbar Exercises seated core exercises of hip tosses, shoulder tosses, chops, Vs, all for 10 reps with 4.4# ball      Knee/Hip Exercises: Seated   Long Arc Quad Strengthening;Both;15 reps;Weights;Limitations    Long Arc Quad Weight 3 lbs.    Long [employer] Limitations performed with lat press, alternating    Heel Slides Strengthening;Both;Limitations;2 sets;10 reps    Heel Slides Limitations against red band needing towel for comfort    Marching Strengthening;Both;2 sets;10 reps    Marching Limitations  performed with lat press, alternating    Marching Weights 3 lbs.                 Balance Exercises - 07/26/21 0001       Balance Exercises: Standing   Step Ups Forward;Lateral;4 inch;Limitations    Step Ups Limitations step up with single leg with OH reach, alt, with cervical extension, 10x per LE                 PT Short Term Goals - 07/02/21 1323       PT SHORT TERM GOAL #1   Title Patient to demonstarte independence in initial HEP    Baseline 05/28/21 initial HEP issued    Status Achieved    Target Date 07/01/21      PT SHORT TERM GOAL #2   Title Assess TUG and set appropreiate goal    Baseline 05/28/21 TUG score 12.28s, no goal needed    Status Achieved      PT SHORT TERM GOAL #3   Title Assess DGI and set appropriate goal    Baseline 06/20/21 DGI score 21, no goal needed    Status Achieved      PT SHORT TERM GOAL #4   Title Patient to negotiate 4 steps with single rail and most appropriate pattern    Baseline 07/02/21: met in session today with single rail with alternating pattern    Status Achieved    Target Date --      PT SHORT TERM GOAL #5   Title Ambulate 237ft with LRAD under S    Baseline 06/20/21: Ambulation distance 345ft on previous session    Status Achieved    Target Date 07/01/21               PT Long Term Goals - 07/17/21 1242       PT LONG TERM GOAL #1   Title Patient I in final HEP. (all LTGS due 07/29/21)    Baseline TBD    Time 8    Period Weeks    Status On-going      PT LONG TERM GOAL #2   Title Assess progress towards DGI    Baseline TBD; score of 21, no goal needed    Status Achieved      PT LONG TERM GOAL #3   Title Increase B hip abduction to 4/5    Baseline B hip abduction 4/5 sterngth B    Time 8    Period Weeks  Status New      PT LONG TERM GOAL #4   Title Patient to ambulate 560ft with LRAD across outside terrain    Baseline 177ft with CGA over level surfaces    Time 8    Period Weeks    Status  On-going      PT LONG TERM GOAL #5   Title Assess progress towards TUG    Baseline TBD; 06/20/21 initial TUG time 13s, no goal needed    Status Achieved      PT LONG TERM GOAL #6   Title Patient to negotiate 16 steps with most appropriate pattern and UE support    Baseline 4 steps with B rails and step to pattern    Time 8    Period Weeks    Status On-going      PT LONG TERM GOAL #7   Title Patient to increase gait speed to >0.39m/s    Baseline Initial gait velacity 0.87m/s    Time 8    Period Weeks    Status On-going                   Plan - 07/26/21 1313     Clinical Impression Statement Todays session continued with LE strengthening and functional core training, increased weight and resistance as noted, added stepping tasks incorporating WS and OH reach w/o allowing UE support.  no pain but some instability noted with sidestepping and weight shifting onto 4" blocks.  Despite good strength and balance, patient feels safer using rollator outside the home as it is more convenient, allowing her to sit and carry objects.    Personal Factors and Comorbidities Comorbidity 1    Comorbidities OA in knees/spine    Examination-Activity Limitations Locomotion Level;Stairs;Transfers    Stability/Clinical Decision Making Stable/Uncomplicated    Rehab Potential Good    PT Frequency 2x / week    PT Duration 8 weeks    PT Treatment/Interventions ADLs/Self Care Home Management;Aquatic Therapy;DME Instruction;Gait training;Stair training;Functional mobility training;Therapeutic activities;Therapeutic exercise;Balance training;Neuromuscular re-education;Patient/family education    PT Next Visit Plan Outside ambulation, stepping tasks and WS    PT Home Exercise Plan [email or web address]    Consulted and Agree with Plan of Care Patient;Other (Comment)   interpretter            Patient will benefit from skilled therapeutic intervention in order to improve the following deficits and impairments:   Abnormal gait, Difficulty walking, Decreased endurance, Decreased activity tolerance, Decreased mobility, Decreased strength  Visit Diagnosis: Other abnormalities of gait and mobility  Unsteadiness on feet  Muscle weakness (generalized)     Problem List Patient Active Problem List   Diagnosis Date Noted   S/P hernia repair 09/13/2020   Gallstones 03/13/2020   Bruising 02/23/2020   Overactive bladder 10/25/2019   Inguinal hernia 03/02/2018   Urinary tract infection 02/03/2017   Well adult exam 11/04/2016   IBS (irritable bowel syndrome) 11/04/2016   Pancreatic insufficiency 03/03/2016   Headache 12/03/2015   Elevated alkaline phosphatase level 08/03/2015   Onychomycosis 08/03/2015   Insomnia 04/02/2015   Indigestion 04/02/2015   Chest pain, atypical 12/26/2014   Pain in left hip 04/13/2014   Snoring 11/09/2012   Skin cancer 11/09/2012   Mass of ear auricle 08/09/2012   Actinic keratoses 08/09/2012   Low back pain 10/28/2011   Preop examination 10/28/2011   Uterine prolaps 10/09/2011   Atrophic vaginitis 08/29/2011   Knee pain, left 07/25/2011   Anxiety disorder 05/31/2010   Constipation  05/02/2010   Abnormal CBC 07/31/2009   Abdominal pain 08/07/2008   ABNORMAL GLUCOSE NEC 08/07/2008   ABDOMINAL XRAY, ABNORMAL 08/07/2008   CAROTID BRUIT, RIGHT 02/22/2008   Dyslipidemia 11/21/2007   OSTEOARTHRITIS 11/21/2007   OSTEOPENIA 11/21/2007   Migraines 11/15/2007   Essential hypertension 09/06/2007   GERD 09/06/2007   PALPITATIONS, HX OF 09/06/2007    Lanice Shirts PT 07/26/2021, 1:28 PM  [facility] [address] [place], [place], [address] Phone: [phone]   Fax:  [phone]  Name: Sophia Santos MRN: [id number] Date of Birth: 03-07-1934

## 2021-07-29 ENCOUNTER — Ambulatory Visit: Payer: Medicare Other

## 2021-07-31 ENCOUNTER — Other Ambulatory Visit: Payer: Self-pay

## 2021-07-31 ENCOUNTER — Other Ambulatory Visit (HOSPITAL_COMMUNITY)
Admission: RE | Admit: 2021-07-31 | Discharge: 2021-07-31 | Disposition: A | Discharge status: Home / Self Care | Payer: Medicare Other | Source: Ambulatory Visit | Attending: Family Medicine | Admitting: Family Medicine

## 2021-07-31 ENCOUNTER — Ambulatory Visit (INDEPENDENT_AMBULATORY_CARE_PROVIDER_SITE_OTHER): Payer: Medicare Other | Admitting: Family Medicine

## 2021-07-31 VITALS — Wt 129.7 lb

## 2021-07-31 DIAGNOSIS — N950 Postmenopausal bleeding: Secondary | ICD-10-CM | POA: Insufficient documentation

## 2021-07-31 NOTE — Assessment & Plan Note (Addendum)
S/p endometrial sampling today. Patient with bleeding post biopsy. Treatment depending on results.

## 2021-07-31 NOTE — Progress Notes (Signed)
Subjective:    [name] is a 87 y.o. female presenting with Abnormal Uterine Bleeding  on 07/31/2021  HPI: No bleeding x 2 weeks. Menopause at age 69. On Aggrenox. U/s shows 5.6 mm stripe, and few old calcified fibroids.  Review of Systems  Constitutional:  Negative for chills and fever.  Respiratory:  Negative for shortness of breath.   Cardiovascular:  Negative for chest pain.  Gastrointestinal:  Negative for abdominal pain, nausea and vomiting.  Genitourinary:  Negative for dysuria.  Skin:  Negative for rash.     Objective:    Wt 129 lb 11.2 oz (58.8 kg)   BMI 25.33 kg/m  Physical Exam Constitutional:      General: She is not in acute distress.    Appearance: She is well-developed.  HENT:     Head: Normocephalic and atraumatic.  Eyes:     General: No scleral icterus. Cardiovascular:     Rate and Rhythm: Normal rate.  Pulmonary:     Effort: Pulmonary effort is normal.  Abdominal:     Palpations: Abdomen is soft.  Musculoskeletal:     Cervical back: Neck supple.  Skin:    General: Skin is warm and dry.  Neurological:     Mental Status: She is alert and oriented to person, place, and time.   US PELVIC COMPLETE WITH TRANSVAGINAL  Result Date: 07/19/2021 CLINICAL DATA:  Postmenopausal bleeding EXAM: TRANSABDOMINAL AND TRANSVAGINAL ULTRASOUND OF PELVIS TECHNIQUE: Both transabdominal and transvaginal ultrasound examinations of the pelvis were performed. Transabdominal technique was performed for global imaging of the pelvis including uterus, ovaries, adnexal regions, and pelvic cul-de-sac. It was necessary to proceed with endovaginal exam following the transabdominal exam to visualize the uterus endometrium ovaries. COMPARISON:  Ultrasound 10/21/2017, CT 09/30/2016 FINDINGS: Uterus Measurements: 7.6 x 4.1 x 4.3 cm = volume: 69.7 mL. Densely calcified shadowing mass within the right uterus measuring 2.2 x 3.2 x 2.4 cm. Multiple nonspecific cervical  calcifications. Endometrium Thickness: 5.6 mm.  No focal abnormality visualized. Right ovary Measurements: 1.6 x 0.9 x 1 cm = volume: 0.8 mL. Normal appearance/no adnexal mass. Left ovary Measurements: 1.2 x 0.9 x 0.9 cm = volume: 0.5 mL. Normal appearance/no adnexal mass. Other findings No abnormal free fluid. IMPRESSION: 1. Endometrial thickness of 5.6 mm. In the setting of post-menopausal bleeding, endometrial sampling is indicated to exclude carcinoma. If results are benign, sonohysterogram should be considered for focal lesion work-up. (Ref: Radiological Reasoning: Algorithmic Workup of Abnormal Vaginal Bleeding with Endovaginal Sonography and Sonohysterography. AJR 2008; [id number]) 2. Densely calcified shadowing mass within the right uterus presumably representing a calcified fibroid. Electronically Signed   By: Donavan Foil M.D.   On: 07/19/2021 16:12    Procedure: Patient given informed consent, signed copy in the chart, time out was performed. Appropriate time out taken. . The patient was placed in the lithotomy position and the cervix brought into view with sterile speculum.  Portio of cervix cleansed x 2 with betadine swabs.  A tenaculum was placed in the anterior lip of the cervix.  The uterus was sounded for depth of 7 cm. A pipelle was introduced to into the uterus, suction created,  and an endometrial sample was obtained. Curette passed x 3. All equipment was removed and accounted for.  The patient tolerated the procedure well.      Assessment & Plan:   Problem List Items Addressed This Visit       Unprioritized   Postmenopausal bleeding - Primary  S/p endometrial sampling today. Patient with bleeding post biopsy. Treatment depending on results.      Relevant Orders   Surgical pathology( [facility]/ [facility])    No follow-ups on file.  Donnamae Jude 07/31/2021 3:17 PM

## 2021-08-01 ENCOUNTER — Ambulatory Visit: Payer: Medicare Other

## 2021-08-02 ENCOUNTER — Other Ambulatory Visit: Payer: Self-pay

## 2021-08-05 ENCOUNTER — Ambulatory Visit: Payer: Medicare Other

## 2021-08-05 ENCOUNTER — Telehealth: Payer: Self-pay | Admitting: Family Medicine

## 2021-08-05 ENCOUNTER — Other Ambulatory Visit: Payer: Self-pay | Admitting: General Practice

## 2021-08-05 ENCOUNTER — Other Ambulatory Visit: Payer: Self-pay | Admitting: Internal Medicine

## 2021-08-05 ENCOUNTER — Ambulatory Visit: Payer: Medicare Other | Admitting: Nurse Practitioner

## 2021-08-05 DIAGNOSIS — C541 Malignant neoplasm of endometrium: Secondary | ICD-10-CM

## 2021-08-05 NOTE — Telephone Encounter (Signed)
Called patient and informed her of results of endometrial sampling which reveals FIGO 1 Endometrial Adenocarcinoma. Referral made to GYN/ONC--appointment received. Information given with [place] interpreter and she asked me to call her Daughter-in-law. Shared diagnosis and appointment time/place/date with DIL with patient's verbal permission.

## 2021-08-06 LAB — SURGICAL PATHOLOGY

## 2021-08-07 ENCOUNTER — Inpatient Hospital Stay: Payer: Medicare Other | Attending: Gynecologic Oncology | Admitting: Gynecologic Oncology

## 2021-08-07 ENCOUNTER — Inpatient Hospital Stay (HOSPITAL_BASED_OUTPATIENT_CLINIC_OR_DEPARTMENT_OTHER): Payer: Medicare Other | Admitting: Gynecologic Oncology

## 2021-08-07 ENCOUNTER — Other Ambulatory Visit: Payer: Self-pay

## 2021-08-07 ENCOUNTER — Ambulatory Visit: Payer: Medicare Other | Admitting: Internal Medicine

## 2021-08-07 ENCOUNTER — Encounter: Payer: Self-pay | Admitting: Gynecologic Oncology

## 2021-08-07 VITALS — BP 185/89 | HR 74 | Temp 97.8°F | Resp 18 | Ht 60.0 in | Wt 127.4 lb

## 2021-08-07 DIAGNOSIS — N814 Uterovaginal prolapse, unspecified: Secondary | ICD-10-CM | POA: Insufficient documentation

## 2021-08-07 DIAGNOSIS — I10 Essential (primary) hypertension: Secondary | ICD-10-CM | POA: Diagnosis not present

## 2021-08-07 DIAGNOSIS — Z79899 Other long term (current) drug therapy: Secondary | ICD-10-CM | POA: Diagnosis not present

## 2021-08-07 DIAGNOSIS — M1990 Unspecified osteoarthritis, unspecified site: Secondary | ICD-10-CM | POA: Diagnosis not present

## 2021-08-07 DIAGNOSIS — Z79818 Long term (current) use of other agents affecting estrogen receptors and estrogen levels: Secondary | ICD-10-CM | POA: Diagnosis not present

## 2021-08-07 DIAGNOSIS — K589 Irritable bowel syndrome without diarrhea: Secondary | ICD-10-CM | POA: Insufficient documentation

## 2021-08-07 DIAGNOSIS — C541 Malignant neoplasm of endometrium: Secondary | ICD-10-CM | POA: Insufficient documentation

## 2021-08-07 DIAGNOSIS — Z800 Family history of malignant neoplasm of digestive organs: Secondary | ICD-10-CM | POA: Diagnosis not present

## 2021-08-07 DIAGNOSIS — Z85828 Personal history of other malignant neoplasm of skin: Secondary | ICD-10-CM | POA: Diagnosis not present

## 2021-08-07 DIAGNOSIS — E785 Hyperlipidemia, unspecified: Secondary | ICD-10-CM | POA: Insufficient documentation

## 2021-08-07 DIAGNOSIS — M810 Age-related osteoporosis without current pathological fracture: Secondary | ICD-10-CM | POA: Diagnosis not present

## 2021-08-07 DIAGNOSIS — R519 Headache, unspecified: Secondary | ICD-10-CM | POA: Insufficient documentation

## 2021-08-07 DIAGNOSIS — K219 Gastro-esophageal reflux disease without esophagitis: Secondary | ICD-10-CM | POA: Diagnosis not present

## 2021-08-07 DIAGNOSIS — Z823 Family history of stroke: Secondary | ICD-10-CM | POA: Diagnosis not present

## 2021-08-07 MED ORDER — MEGESTROL ACETATE 40 MG PO TABS: 80.0000 mg | ORAL_TABLET | Freq: Two times a day (BID) | ORAL | 0 refills | Status: DC

## 2021-08-07 MED ORDER — TRAMADOL HCL 50 MG PO TABS: 50.0000 mg | ORAL_TABLET | Freq: Four times a day (QID) | ORAL | 0 refills | Status: DC | PRN

## 2021-08-07 MED ORDER — SENNOSIDES-DOCUSATE SODIUM 8.6-50 MG PO TABS: 2.0000 | ORAL_TABLET | Freq: Every day | ORAL | 0 refills | Status: DC

## 2021-08-07 NOTE — H&P (View-Only) (Signed)
Consult Note: Gyn-Onc  Consult was requested by Dr. Kennon Rounds for the evaluation of Sophia Santos 87 y.o. female  CC:  Chief Complaint  Patient presents with   Endometrial adenocarcinoma [facility])    Assessment/Plan:  Sophia Santos  is a 87 y.o.  year old P78 russian speaking [place] woman with grade 1 endometrial cancer. Seen in the presence of an interpretor.   A detailed discussion was held with the patient with regard to to her endometrial cancer diagnosis. We discussed the standard management options for uterine cancer which includes surgery followed possibly by adjuvant therapy depending on the results of surgery. The surgical management include a robotic assisted total hysterectomy and removal of the tubes and ovaries with sampling of lymph nodes. If a minimally invasive approach is not feasible, a laparotomy may be necessary (including for specimen delivery). The patient has been counseled about these surgical options and the risks of surgery in general including infection, bleeding, damage to surrounding structures (including bowel, bladder, ureters, nerves or vessels), and the postoperative risks of PE/ DVT, and lymphedema. After counseling and consideration of her options, she is in agreement to proceed with robotic assisted total hysterectomy with bilateral sapingo-oophorectomy and SLN biopsy.   She will be seen by anesthesia for preoperative clearance and discussion of postoperative pain management.  She was given the opportunity to ask questions, which were answered to her satisfaction, and she is agreement with the above mentioned plan of care.   We explained that robotic hysterectomy is typically an outpatient procedure with same day discharge provided that she is meeting appropriate discharge criteria from the PACU. We provided extensive counseling regarding post-operative expectations for recovery and restrictions/limitations. We provided information regarding multi-modal pain therapy  and the importance of avoidance of opioids.   We explained that after surgery we will review her definitive pathology and determine if adjuvant therapy is recommended.   She will hold her Aggrenox as of now and not restart until at least 1 week postoperatively.  We prescribed progesterone therapy to help with her heavy vaginal bleeding prior to surgery.  HPI: Sophia Santos is a 87 year old P1 who was seen in consultation at the request of Dr Kennon Rounds for evaluation and treatment of grade 1 endometrial cancer.   Her symptoms began in early August, 2022 with postmenopausal bleeding (heavy).  She saw her PCP, Tye Savoy, NP, for a problem visit due to this symptom on 07/18/21. She was then referred to Dr Kennon Rounds from Gynecology who saw the patient on 07/31/21. Work-up of symptoms included a transvaginal US and endometrial biopsy. Transvaginal US on 07/19/2021 showed a uterus measuring 7.6 x 4.1 x 4.3 cm.  There were fibroids seen within the uterus.  The endometrium measured 5.6 mm.  The ovaries bilaterally were grossly normal. Endometrial sampling with an endometrial Pipelle was performed on 07/31/2021 and showed FIGO grade 1 endometrioid endometrial adenocarcinoma.   The patient has medical history significant for taking Aggrenox for history of headaches and a family history of stroke.  The patient personally has never had a stroke, heart attack, or VTE events.  The patient has a medical history remarkable for cataracts, diverticulosis, gallstones, hyperlipidemia and hypertension, and osteoporosis.  She has uterine prolapse treated with a pessary.  Her surgical history is remarkable for a laparoscopic bilateral inguinal hernia repair with Dr. Rosendo Gros which included placement of mesh in September 2021.  Her gynecologic history is remarkable for an SVD x1.  Patient's family history is significant for  a mother and sister with colon cancer.  The patient lives alone but has supportive family.  She is an  immigrant from [place].  Current Meds:  Outpatient Encounter Medications as of 08/07/2021  Medication Sig   acetaminophen (TYLENOL) 325 MG tablet Take 352 mg by mouth every 6 (six) hours as needed for mild pain or moderate pain.   ALPRAZolam (XANAX) 1 MG tablet Take 0.5-1 tablets (0.5-1 mg total) by mouth at bedtime as needed for anxiety or sleep.   captopril (CAPOTEN) 50 MG tablet TAKE 1 TABLET BY MOUTH TWICE A DAY   Cholecalciferol 1000 UNITS tablet Take 1,000 Units by mouth daily.   conjugated estrogens (PREMARIN) vaginal cream USE VAGINALLY once or TWICE WEEKLY   dicyclomine (BENTYL) 10 MG capsule TAKE 1 CAPSULE BY MOUTH THREE TIMES A DAY AS NEEDED FOR SPASMS   ezetimibe (ZETIA) 10 MG tablet TAKE 1 TABLET BY MOUTH EVERY DAY   megestrol (MEGACE) 40 MG tablet Take 2 tablets (80 mg total) by mouth 2 (two) times daily.   MYRBETRIQ 25 MG TB24 tablet Take 1 tablet (25 mg total) by mouth daily.   Omega 3 1000 MG CAPS Take 1,000 mg by mouth daily.   omeprazole (PRILOSEC) 20 MG capsule Take 2 capsules (40 mg total) by mouth daily. Annual appt due in March must see provider for future refills   Pancrelipase, Lip-Prot-Amyl, (CREON) 24000-76000 units CPEP TAKE ONE CAPSULE BY MOUTH 3 TIMES A DAY WITH FOOD   polyethylene glycol powder (GAVILAX) 17 GM/SCOOP powder MIX 1 CAPFUL (17 GRAMS) AS DIRECTED AND DRINK TWICE A DAY AS NEEDED FOR MODERATE CONSTIPATION   propranolol ER (INDERAL LA) 120 MG 24 hr capsule Take 1 capsule (120 mg total) by mouth daily.   RESTASIS 0.05 % ophthalmic emulsion Place 1 drop into both eyes 2 (two) times daily.    senna-docusate (SENOKOT-S) 8.6-50 MG tablet Take 2 tablets by mouth at bedtime. For AFTER surgery, do not take if having diarrhea   topiramate (TOPAMAX) 50 MG tablet Take 50 mg by mouth at bedtime.    traMADol (ULTRAM) 50 MG tablet Take 1 tablet (50 mg total) by mouth every 6 (six) hours as needed for severe pain. For AFTER surgery only, do not take and drive   Vitamin  A [id number] MCG (8000 UT) CAPS Take 2,400 mcg by mouth daily.   dipyridamole-aspirin (AGGRENOX) 200-25 MG 12hr capsule Take 1 capsule by mouth 2 (two) times daily. (Patient not taking: Reported on 08/07/2021)   [DISCONTINUED] hydrocortisone (ANUSOL-HC) 2.5 % rectal cream Place 1 application rectally at bedtime. For 10 nights   No facility-administered encounter medications on file as of 08/07/2021.    Allergy:  Allergies  Allergen Reactions   Fosamax [Alendronate Sodium]     pains   Lipitor [Atorvastatin]     arthralgias   Voltaren [Diclofenac Sodium]     Skin rash from gel   Zocor [Simvastatin]     REACTION: cramp    Social Hx:   Social History   Socioeconomic History   Marital status: Widowed    Spouse name: Not on file   Number of children: 1   Years of education: Not on file   Highest education level: Not on file  Occupational History   Occupation: retired  Tobacco Use   Smoking status: Never   Smokeless tobacco: Never  Vaping Use   Vaping Use: Never used  Substance and Sexual Activity   Alcohol use: No   Drug use: No  Sexual activity: Not Currently    Birth control/protection: None  Other Topics Concern   Not on file  Social History Narrative   One Boy    Daily Caffeine - tea         Social Determinants of Health   Financial Resource Strain: Not on file  Food Insecurity: Not on file  Transportation Needs: Not on file  Physical Activity: Not on file  Stress: Not on file  Social Connections: Not on file  Intimate Partner Violence: Not on file    Past Surgical Hx:  Past Surgical History:  Procedure Laterality Date   bilateral cataracts     COLONOSCOPY     INGUINAL HERNIA REPAIR Bilateral 09/13/2020   Procedure: LAPAROSCOPIC BILATERAL INGUINAL HERNIA REPAIR WITH MESH;  Surgeon: Ralene Ok, MD;  Location: [facility];  Service: General;  Laterality: Bilateral;   uterine prolapse surgery      Past Medical Hx:  Past Medical History:  Diagnosis Date    Arthritis    Blood in stool    with rectal pain    Cataract    bilateral eyes removed   Colon polyp    Complication of anesthesia    "years ago, hard to awaken, no with recent procedures."   Diverticulosis    Gallstones    GERD (gastroesophageal reflux disease)    Headache    HTN (hypertension)    Hyperlipidemia    Irritable bowel syndrome (IBS)    Osteoarthritis    Osteopenia    Osteoporosis    Skin cancer    skin cancer -    Uterine prolapse     Past Gynecological History:  see HPI No LMP recorded. Patient is postmenopausal.  Family Hx:  Family History  Problem Relation Age of Onset   Colon cancer Mother 53       colorectal   Stroke Mother    Parkinson's disease Father    Dementia Father    Colon cancer Sister        colorectal   Colon polyps Sister    Coronary artery disease Other        female 1st degree <50   Esophageal cancer Neg Hx    Stomach cancer Neg Hx    Anesthesia problems Neg Hx    Breast cancer Neg Hx    Endometrial cancer Neg Hx    Pancreatic cancer Neg Hx    Prostate cancer Neg Hx     Review of Systems:  Constitutional  Feels well,    ENT Normal appearing ears and nares bilaterally Skin/Breast  No rash, sores, jaundice, itching, dryness Cardiovascular  No chest pain, shortness of breath, or edema  Pulmonary  No cough or wheeze.  Gastro Intestinal  No nausea, vomitting, or diarrhoea. No bright red blood per rectum, no abdominal pain, change in bowel movement, or constipation.  Genito Urinary  No frequency, urgency, dysuria, + postmenopausal bleeding Musculo Skeletal  No myalgia, arthralgia, joint swelling or pain  Neurologic  No weakness, numbness, change in gait,  Psychology  No depression, anxiety, insomnia.   Vitals:  Blood pressure (!) 185/89, pulse 74, temperature 97.8 F (36.6 C), temperature source Oral, resp. rate 18, height 5' (1.524 m), weight 127 lb 6.4 oz (57.8 kg), SpO2 100 %.  Physical Exam: WD in NAD Neck  Supple  NROM, without any enlargements.  Lymph Node Survey No cervical supraclavicular or inguinal adenopathy Cardiovascular  Pulse normal rate, regularity and rhythm. S1 and S2 normal.  Lungs  Clear to auscultation bilateraly, without wheezes/crackles/rhonchi. Good air movement.  Skin  No rash/lesions/breakdown  Psychiatry  Alert and oriented to person, place, and time  Abdomen  Normoactive bowel sounds, abdomen soft, non-tender and obese without evidence of hernia.  Back No CVA tenderness Genito Urinary  Vulva/vagina: Normal external female genitalia.   No lesions. No discharge or bleeding.  Bladder/urethra:  No lesions or masses, well supported bladder  Vagina: normal  Cervix: Normal appearing, no lesions.  Uterus:  Small, mobile, no parametrial involvement or nodularity.  Adnexa: no palpable masses. Rectal  deferred Extremities  No bilateral cyanosis, clubbing or edema.  60 minutes of total time was spent for this patient encounter, including preparation, face-to-face counseling with the patient and coordination of care, and documentation of the encounter.   Thereasa Solo, MD  08/07/2021, 5:27 PM

## 2021-08-07 NOTE — Patient Instructions (Signed)
Preparing for your Surgery  Plan for surgery on August 21, 2021 with Dr. Everitt Amber at [facility]. You will be scheduled for a robotic assisted total laparoscopic hysterectomy (removal of the uterus and cervix), bilateral salpingo-oophorectomy (removal of both ovaries and fallopian tubes), sentinel lymph node biopsy, possible lymph node dissection.   Pre-operative Testing -You will receive a phone call from presurgical testing at [facility] to arrange for a pre-operative appointment and lab work.  -Bring your insurance card, copy of an advanced directive if applicable, medication list  -At that visit, you will be asked to sign a consent for a possible blood transfusion in case a transfusion becomes necessary during surgery.  The need for a blood transfusion is rare but having consent is a necessary part of your care.     -You should not be taking blood thinners or aspirin at least ten days prior to surgery unless instructed by your surgeon. Do not resume your aggrenox until advised by Dr. Denman George after surgery.  -Do not take supplements such as fish oil (omega 3), red yeast rice, turmeric before your surgery. You want to avoid medications with aspirin in them including headache powders such as BC or Goody's), Excedrin migraine.  Day Before Surgery at [place] will be asked to take in a light diet the day before surgery. You will be advised you can have clear liquids up until 3 hours before your surgery.    Eat a light diet the day before surgery.  Examples including soups, broths, toast, yogurt, mashed potatoes.  AVOID GAS PRODUCING FOODS. Things to avoid include carbonated beverages (fizzy beverages, sodas), raw fruits and raw vegetables (uncooked), or beans.   If your bowels are filled with gas, your surgeon will have difficulty visualizing your pelvic organs which increases your surgical risks.  Your role in recovery Your role is to become active as soon as  directed by your doctor, while still giving yourself time to heal.  Rest when you feel tired. You will be asked to do the following in order to speed your recovery:  - Cough and breathe deeply. This helps to clear and expand your lungs and can prevent pneumonia after surgery.  - [place]. Do mild physical activity. Walking or moving your legs help your circulation and body functions return to normal. Do not try to get up or walk alone the first time after surgery.   -If you develop swelling on one leg or the other, pain in the back of your leg, redness/warmth in one of your legs, please call the office or go to the Emergency Room to have a doppler to rule out a blood clot. For shortness of breath, chest pain-seek care in the Emergency Room as soon as possible. - Actively manage your pain. Managing your pain lets you move in comfort. We will ask you to rate your pain on a scale of zero to 10. It is your responsibility to tell your doctor or nurse where and how much you hurt so your pain can be treated.  Special Considerations -If you are diabetic, you may be placed on insulin after surgery to have closer control over your blood sugars to promote healing and recovery.  This does not mean that you will be discharged on insulin.  If applicable, your oral antidiabetics will be resumed when you are tolerating a solid diet.  -Your final pathology results from surgery should be available around one week  after surgery and the results will be relayed to you when available.  -FMLA forms can be faxed to [phone] and please allow 5-7 business days for completion.  Pain Management After Surgery -You have been prescribed your pain medication and bowel regimen medications before surgery so that you can have these available when you are discharged from the hospital. The pain medication is for use ONLY AFTER surgery and a new prescription will not be given.   -Make sure that you have Tylenol  and Ibuprofen (200-400 mg) at home to use on a regular basis after surgery for pain control. We recommend alternating the medications every hour to six hours since they work differently and are processed in the body differently for pain relief.  -Review the attached handout on narcotic use and their risks and side effects.   Bowel Regimen -You have been prescribed Sennakot-S to take nightly to prevent constipation especially if you are taking the narcotic pain medication intermittently.  It is important to prevent constipation and drink adequate amounts of liquids. You can stop taking this medication when you are not taking pain medication and you are back on your normal bowel routine.  Risks of Surgery Risks of surgery are low but include bleeding, infection, damage to surrounding structures, re-operation, blood clots, and very rarely death.   Blood Transfusion Information (For the consent to be signed before surgery)  We will be checking your blood type before surgery so in case of emergencies, we will know what type of blood you would need.                                            WHAT IS A BLOOD TRANSFUSION?  A transfusion is the replacement of blood or some of its parts. Blood is made up of multiple cells which provide different functions. Red blood cells carry oxygen and are used for blood loss replacement. White blood cells fight against infection. Platelets control bleeding. Plasma helps clot blood. Other blood products are available for specialized needs, such as hemophilia or other clotting disorders. BEFORE THE TRANSFUSION  Who gives blood for transfusions?  You may be able to donate blood to be used at a later date on yourself (autologous donation). Relatives can be asked to donate blood. This is generally not any safer than if you have received blood from a stranger. The same precautions are taken to ensure safety when a relative's blood is donated. Healthy volunteers who  are fully evaluated to make sure their blood is safe. This is blood bank blood. Transfusion therapy is the safest it has ever been in the practice of medicine. Before blood is taken from a donor, a complete history is taken to make sure that person has no history of diseases nor engages in risky social behavior (examples are intravenous drug use or sexual activity with multiple partners). The donor's travel history is screened to minimize risk of transmitting infections, such as malaria. The donated blood is tested for signs of infectious diseases, such as HIV and hepatitis. The blood is then tested to be sure it is compatible with you in order to minimize the chance of a transfusion reaction. If you or a relative donates blood, this is often done in anticipation of surgery and is not appropriate for emergency situations. It takes many days to process the donated blood. RISKS AND COMPLICATIONS Although transfusion  therapy is very safe and saves many lives, the main dangers of transfusion include:  Getting an infectious disease. Developing a transfusion reaction. This is an allergic reaction to something in the blood you were given. Every precaution is taken to prevent this. The decision to have a blood transfusion has been considered carefully by your caregiver before blood is given. Blood is not given unless the benefits outweigh the risks.  AFTER SURGERY INSTRUCTIONS  Return to work: 4 weeks if applicable  Activity: 1. Be up and out of the bed during the day.  Take a nap if needed.  You may walk up steps but be careful and use the hand rail.  Stair climbing will tire you more than you think, you may need to stop part way and rest.   2. No lifting or straining for 6 weeks over 10 pounds. No pushing, pulling, straining for 6 weeks.  3. No driving for 1 week(s).  Do not drive if you are taking narcotic pain medicine and make sure that your reaction time has returned.   4. You can shower as soon as  the next day after surgery. Shower daily.  Use your regular soap and water (not directly on the incision) and pat your incision(s) dry afterwards; don't rub.  No tub baths or submerging your body in water until cleared by your surgeon. If you have the soap that was given to you by pre-surgical testing that was used before surgery, you do not need to use it afterwards because this can irritate your incisions.   5. No sexual activity and nothing in the vagina for 8 weeks.  6. You may experience a small amount of clear drainage from your incisions, which is normal.  If the drainage persists, increases, or changes color please call the office.  7. Do not use creams, lotions, or ointments such as neosporin on your incisions after surgery until advised by your surgeon because they can cause removal of the dermabond glue on your incisions.    8. You may experience vaginal spotting after surgery or around the 6-8 week mark from surgery when the stitches at the top of the vagina begin to dissolve.  The spotting is normal but if you experience heavy bleeding, call our office.  9. Take Tylenol or ibuprofen first for pain and only use narcotic pain medication for severe pain not relieved by the Tylenol or Ibuprofen.  Monitor your Tylenol intake to a max of 4,000 mg in a 24 hour period. You can alternate these medications after surgery.  Diet: 1. Low sodium Heart Healthy Diet is recommended but you are cleared to resume your normal (before surgery) diet after your procedure.  2. It is safe to use a laxative, such as Miralax or Colace, if you have difficulty moving your bowels. You have been prescribed Sennakot at bedtime every evening to keep bowel movements regular and to prevent constipation.    Wound Care: 1. Keep clean and dry.  Shower daily.  Reasons to call the Doctor: Fever - Oral temperature greater than 100.4 degrees Fahrenheit Foul-smelling vaginal discharge Difficulty urinating Nausea and  vomiting Increased pain at the site of the incision that is unrelieved with pain medicine. Difficulty breathing with or without chest pain New calf pain especially if only on one side Sudden, continuing increased vaginal bleeding with or without clots.   Contacts: For questions or concerns you should contact:  Dr. Everitt Amber at [phone]  Joylene John, NP at [phone]  After  Hours: call [phone] and have the GYN Oncologist paged/contacted (after 5 pm or on the weekends).  Messages sent via mychart are for non-urgent matters and are not responded to after hours so for urgent needs, please call the after hours number.

## 2021-08-07 NOTE — Progress Notes (Signed)
Consult Note: Gyn-Onc  Consult was requested by Dr. Kennon Santos for the evaluation of Sophia Santos 87 y.o. female  CC:  Chief Complaint  Patient presents with   Endometrial adenocarcinoma [facility])    Assessment/Plan:  [name]  is a 87 y.o.  year old P104 russian speaking [place] woman with grade 1 endometrial cancer. Seen in the presence of an interpretor.   A detailed discussion was held with the patient with regard to to her endometrial cancer diagnosis. We discussed the standard management options for uterine cancer which includes surgery followed possibly by adjuvant therapy depending on the results of surgery. The surgical management include a robotic assisted total hysterectomy and removal of the tubes and ovaries with sampling of lymph nodes. If a minimally invasive approach is not feasible, a laparotomy may be necessary (including for specimen delivery). The patient has been counseled about these surgical options and the risks of surgery in general including infection, bleeding, damage to surrounding structures (including bowel, bladder, ureters, nerves or vessels), and the postoperative risks of PE/ DVT, and lymphedema. After counseling and consideration of her options, she is in agreement to proceed with robotic assisted total hysterectomy with bilateral sapingo-oophorectomy and SLN biopsy.   She will be seen by anesthesia for preoperative clearance and discussion of postoperative pain management.  She was given the opportunity to ask questions, which were answered to her satisfaction, and she is agreement with the above mentioned plan of care.   We explained that robotic hysterectomy is typically an outpatient procedure with same day discharge provided that she is meeting appropriate discharge criteria from the PACU. We provided extensive counseling regarding post-operative expectations for recovery and restrictions/limitations. We provided information regarding multi-modal pain therapy  and the importance of avoidance of opioids.   We explained that after surgery we will review her definitive pathology and determine if adjuvant therapy is recommended.   She will hold her Aggrenox as of now and not restart until at least 1 week postoperatively.  We prescribed progesterone therapy to help with her heavy vaginal bleeding prior to surgery.  HPI: Ms Sophia Santos is a 87 year old P1 who was seen in consultation at the request of Dr Sophia Santos for evaluation and treatment of grade 1 endometrial cancer.   Her symptoms began in early August, 2022 with postmenopausal bleeding (heavy).  She saw her PCP, Sophia Savoy, NP, for a problem visit due to this symptom on 07/18/21. She was then referred to Dr Sophia Santos from Gynecology who saw the patient on 07/31/21. Work-up of symptoms included a transvaginal US and endometrial biopsy. Transvaginal US on 07/19/2021 showed a uterus measuring 7.6 x 4.1 x 4.3 cm.  There were fibroids seen within the uterus.  The endometrium measured 5.6 mm.  The ovaries bilaterally were grossly normal. Endometrial sampling with an endometrial Pipelle was performed on 07/31/2021 and showed FIGO grade 1 endometrioid endometrial adenocarcinoma.   The patient has medical history significant for taking Aggrenox for history of headaches and a family history of stroke.  The patient personally has never had a stroke, heart attack, or VTE events.  The patient has a medical history remarkable for cataracts, diverticulosis, gallstones, hyperlipidemia and hypertension, and osteoporosis.  She has uterine prolapse treated with a pessary.  Her surgical history is remarkable for a laparoscopic bilateral inguinal hernia repair with Dr. Rosendo Santos which included placement of mesh in September 2021.  Her gynecologic history is remarkable for an SVD x1.  Patient's family history is significant for  a mother and sister with colon cancer.  The patient lives alone but has supportive family.  She is an  immigrant from [place].  Current Meds:  Outpatient Encounter Medications as of 08/07/2021  Medication Sig   acetaminophen (TYLENOL) 325 MG tablet Take 352 mg by mouth every 6 (six) hours as needed for mild pain or moderate pain.   ALPRAZolam (XANAX) 1 MG tablet Take 0.5-1 tablets (0.5-1 mg total) by mouth at bedtime as needed for anxiety or sleep.   captopril (CAPOTEN) 50 MG tablet TAKE 1 TABLET BY MOUTH TWICE A DAY   Cholecalciferol 1000 UNITS tablet Take 1,000 Units by mouth daily.   conjugated estrogens (PREMARIN) vaginal cream USE VAGINALLY once or TWICE WEEKLY   dicyclomine (BENTYL) 10 MG capsule TAKE 1 CAPSULE BY MOUTH THREE TIMES A DAY AS NEEDED FOR SPASMS   ezetimibe (ZETIA) 10 MG tablet TAKE 1 TABLET BY MOUTH EVERY DAY   megestrol (MEGACE) 40 MG tablet Take 2 tablets (80 mg total) by mouth 2 (two) times daily.   MYRBETRIQ 25 MG TB24 tablet Take 1 tablet (25 mg total) by mouth daily.   Omega 3 1000 MG CAPS Take 1,000 mg by mouth daily.   omeprazole (PRILOSEC) 20 MG capsule Take 2 capsules (40 mg total) by mouth daily. Annual appt due in March must see provider for future refills   Pancrelipase, Lip-Prot-Amyl, (CREON) 24000-76000 units CPEP TAKE ONE CAPSULE BY MOUTH 3 TIMES A DAY WITH FOOD   polyethylene glycol powder (GAVILAX) 17 GM/SCOOP powder MIX 1 CAPFUL (17 GRAMS) AS DIRECTED AND DRINK TWICE A DAY AS NEEDED FOR MODERATE CONSTIPATION   propranolol ER (INDERAL LA) 120 MG 24 hr capsule Take 1 capsule (120 mg total) by mouth daily.   RESTASIS 0.05 % ophthalmic emulsion Place 1 drop into both eyes 2 (two) times daily.    senna-docusate (SENOKOT-S) 8.6-50 MG tablet Take 2 tablets by mouth at bedtime. For AFTER surgery, do not take if having diarrhea   topiramate (TOPAMAX) 50 MG tablet Take 50 mg by mouth at bedtime.    traMADol (ULTRAM) 50 MG tablet Take 1 tablet (50 mg total) by mouth every 6 (six) hours as needed for severe pain. For AFTER surgery only, do not take and drive   Vitamin  A [id number] MCG (8000 UT) CAPS Take 2,400 mcg by mouth daily.   dipyridamole-aspirin (AGGRENOX) 200-25 MG 12hr capsule Take 1 capsule by mouth 2 (two) times daily. (Patient not taking: Reported on 08/07/2021)   [DISCONTINUED] hydrocortisone (ANUSOL-HC) 2.5 % rectal cream Place 1 application rectally at bedtime. For 10 nights   No facility-administered encounter medications on file as of 08/07/2021.    Allergy:  Allergies  Allergen Reactions   Fosamax [Alendronate Sodium]     pains   Lipitor [Atorvastatin]     arthralgias   Voltaren [Diclofenac Sodium]     Skin rash from gel   Zocor [Simvastatin]     REACTION: cramp    Social Hx:   Social History   Socioeconomic History   Marital status: Widowed    Spouse name: Not on file   Number of children: 1   Years of education: Not on file   Highest education level: Not on file  Occupational History   Occupation: retired  Tobacco Use   Smoking status: Never   Smokeless tobacco: Never  Vaping Use   Vaping Use: Never used  Substance and Sexual Activity   Alcohol use: No   Drug use: No  Sexual activity: Not Currently    Birth control/protection: None  Other Topics Concern   Not on file  Social History Narrative   One Boy    Daily Caffeine - tea         Social Determinants of Health   Financial Resource Strain: Not on file  Food Insecurity: Not on file  Transportation Needs: Not on file  Physical Activity: Not on file  Stress: Not on file  Social Connections: Not on file  Intimate Partner Violence: Not on file    Past Surgical Hx:  Past Surgical History:  Procedure Laterality Date   bilateral cataracts     COLONOSCOPY     INGUINAL HERNIA REPAIR Bilateral 09/13/2020   Procedure: LAPAROSCOPIC BILATERAL INGUINAL HERNIA REPAIR WITH MESH;  Surgeon: Ralene Ok, MD;  Location: [place];  Service: General;  Laterality: Bilateral;   uterine prolapse surgery      Past Medical Hx:  Past Medical History:  Diagnosis Date    Arthritis    Blood in stool    with rectal pain    Cataract    bilateral eyes removed   Colon polyp    Complication of anesthesia    "years ago, hard to awaken, no with recent procedures."   Diverticulosis    Gallstones    GERD (gastroesophageal reflux disease)    Headache    HTN (hypertension)    Hyperlipidemia    Irritable bowel syndrome (IBS)    Osteoarthritis    Osteopenia    Osteoporosis    Skin cancer    skin cancer -    Uterine prolapse     Past Gynecological History:  see HPI No LMP recorded. Patient is postmenopausal.  Family Hx:  Family History  Problem Relation Age of Onset   Colon cancer Mother 48       colorectal   Stroke Mother    Parkinson's disease Father    Dementia Father    Colon cancer Sister        colorectal   Colon polyps Sister    Coronary artery disease Other        female 1st degree <50   Esophageal cancer Neg Hx    Stomach cancer Neg Hx    Anesthesia problems Neg Hx    Breast cancer Neg Hx    Endometrial cancer Neg Hx    Pancreatic cancer Neg Hx    Prostate cancer Neg Hx     Review of Systems:  Constitutional  Feels well,    ENT Normal appearing ears and nares bilaterally Skin/Breast  No rash, sores, jaundice, itching, dryness Cardiovascular  No chest pain, shortness of breath, or edema  Pulmonary  No cough or wheeze.  Gastro Intestinal  No nausea, vomitting, or diarrhoea. No bright red blood per rectum, no abdominal pain, change in bowel movement, or constipation.  Genito Urinary  No frequency, urgency, dysuria, + postmenopausal bleeding Musculo Skeletal  No myalgia, arthralgia, joint swelling or pain  Neurologic  No weakness, numbness, change in gait,  Psychology  No depression, anxiety, insomnia.   Vitals:  Blood pressure (!) 185/89, pulse 74, temperature 97.8 F (36.6 C), temperature source Oral, resp. rate 18, height 5' (1.524 m), weight 127 lb 6.4 oz (57.8 kg), SpO2 100 %.  Physical Exam: WD in NAD Neck  Supple  NROM, without any enlargements.  Lymph Node Survey No cervical supraclavicular or inguinal adenopathy Cardiovascular  Pulse normal rate, regularity and rhythm. S1 and S2 normal.  Lungs  Clear to auscultation bilateraly, without wheezes/crackles/rhonchi. Good air movement.  Skin  No rash/lesions/breakdown  Psychiatry  Alert and oriented to person, place, and time  Abdomen  Normoactive bowel sounds, abdomen soft, non-tender and obese without evidence of hernia.  Back No CVA tenderness Genito Urinary  Vulva/vagina: Normal external female genitalia.   No lesions. No discharge or bleeding.  Bladder/urethra:  No lesions or masses, well supported bladder  Vagina: normal  Cervix: Normal appearing, no lesions.  Uterus:  Small, mobile, no parametrial involvement or nodularity.  Adnexa: no palpable masses. Rectal  deferred Extremities  No bilateral cyanosis, clubbing or edema.  60 minutes of total time was spent for this patient encounter, including preparation, face-to-face counseling with the patient and coordination of care, and documentation of the encounter.   Thereasa Solo, MD  08/07/2021, 5:27 PM

## 2021-08-08 ENCOUNTER — Encounter: Payer: Self-pay | Admitting: Internal Medicine

## 2021-08-08 ENCOUNTER — Ambulatory Visit: Payer: Medicare Other

## 2021-08-08 ENCOUNTER — Ambulatory Visit (INDEPENDENT_AMBULATORY_CARE_PROVIDER_SITE_OTHER): Payer: Medicare Other | Admitting: Internal Medicine

## 2021-08-08 DIAGNOSIS — N3281 Overactive bladder: Secondary | ICD-10-CM

## 2021-08-08 DIAGNOSIS — F411 Generalized anxiety disorder: Secondary | ICD-10-CM | POA: Diagnosis not present

## 2021-08-08 DIAGNOSIS — C541 Malignant neoplasm of endometrium: Secondary | ICD-10-CM

## 2021-08-08 DIAGNOSIS — I10 Essential (primary) hypertension: Secondary | ICD-10-CM | POA: Diagnosis not present

## 2021-08-08 MED ORDER — ALPRAZOLAM 1 MG PO TABS: 0.5000 mg | ORAL_TABLET | Freq: Every evening | ORAL | 1 refills | Status: DC | PRN

## 2021-08-08 MED ORDER — MYRBETRIQ 25 MG PO TB24: 25.0000 mg | ORAL_TABLET | Freq: Every day | ORAL | 3 refills | Status: DC

## 2021-08-08 NOTE — Progress Notes (Signed)
Patient here with her daughter for consultation with Dr. Everitt Amber for newly diagnosed endometrial cancer and for a pre-operative discussion prior to her scheduled surgery on August 21, 2021. She is scheduled for robotic assisted total laparoscopic hysterectomy, bilateral salpingo-oophorectomy, sentinel lymph node biopsy, possible lymph node dissection. The [place] interpreter had to leave after the discussion with Dr. Denman George but the preop instructions were reviewed with the daughter and patient with the daughter translating. The surgery was discussed in detail.  See after visit summary for additional details. Visual aids used to discuss items related to surgery including sequential compression stockings, foley catheter, IV pump, multi-modal pain regimen including tylenol, photo of the surgical robot, female reproductive system to discuss surgery in detail.      Discussed post-op pain management in detail including the aspects of the enhanced recovery pathway.  Advised her that a new prescription would be sent in for tramadol and it is only to be used for after her upcoming surgery.  We discussed the use of tylenol post-op and to monitor for a maximum of 4,000 mg in a 24 hour period.  Also prescribed sennakot to be used after surgery and to hold if having loose stools.  Discussed bowel regimen in detail. She has stopped her aggrenox and is advised to continue holding the medication. She will plan to use ibuprofen she has at home.   Discussed the use of SCD and measures to take at home to prevent DVT including frequent mobility.  Reportable signs and symptoms of DVT discussed. Post-operative instructions discussed and expectations for after surgery. Incisional care discussed as well including reportable signs and symptoms including erythema, drainage, wound separation.     15 minutes spent with the patient.  Verbalizing understanding of material discussed. No needs or concerns voiced at the end of the visit.    Advised patient and family to call for any needs.  Advised that her post-operative medications had been prescribed and could be picked up at any time.     This appointment is included in the global surgical bundle as pre-operative teaching and has no charge.

## 2021-08-08 NOTE — Assessment & Plan Note (Signed)
Cont w/Myrbetriq

## 2021-08-08 NOTE — Patient Instructions (Signed)
Boost or Ensure plus

## 2021-08-08 NOTE — Assessment & Plan Note (Addendum)
Sophia Santos had PT - developed vaginal bleed after PT-subsequently she was dx'd w/endometrial cancer. Dr Denman George will operate on her on 08/21/21.

## 2021-08-08 NOTE — Progress Notes (Signed)
Subjective:  [name], female    DOB: 07-Aug-1934  Age: 87 y.o. MRN: [id number]  CC: Follow-up (3 month f/u)   HPI Welma Sundby presents for anxiety, HAs, OA, OAB Yvanna had PT - developed vaginal bleed after PT-subsequently she was dx'd w/endometrial cancer. Dr Denman George will operate on her on 08/21/21.  Outpatient Medications Prior to Visit  Medication Sig Dispense Refill   acetaminophen (TYLENOL) 325 MG tablet Take 352 mg by mouth every 6 (six) hours as needed for mild pain or moderate pain.     ALPRAZolam (XANAX) 1 MG tablet Take 0.5-1 tablets (0.5-1 mg total) by mouth at bedtime as needed for anxiety or sleep. 90 tablet 1   captopril (CAPOTEN) 50 MG tablet TAKE 1 TABLET BY MOUTH TWICE A DAY 180 tablet 3   Cholecalciferol 1000 UNITS tablet Take 1,000 Units by mouth daily.     conjugated estrogens (PREMARIN) vaginal cream USE VAGINALLY once or TWICE WEEKLY 30 g 3   dicyclomine (BENTYL) 10 MG capsule TAKE 1 CAPSULE BY MOUTH THREE TIMES A DAY AS NEEDED FOR SPASMS 270 capsule 2   dipyridamole-aspirin (AGGRENOX) 200-25 MG 12hr capsule Take 1 capsule by mouth 2 (two) times daily. 180 capsule 3   ezetimibe (ZETIA) 10 MG tablet TAKE 1 TABLET BY MOUTH EVERY DAY 90 tablet 3   megestrol (MEGACE) 40 MG tablet Take 2 tablets (80 mg total) by mouth 2 (two) times daily. 84 tablet 0   MYRBETRIQ 25 MG TB24 tablet Take 1 tablet (25 mg total) by mouth daily. 90 tablet 3   Omega 3 1000 MG CAPS Take 1,000 mg by mouth daily.     omeprazole (PRILOSEC) 20 MG capsule Take 2 capsules (40 mg total) by mouth daily. Annual appt due in March must see provider for future refills 180 capsule 3   Pancrelipase, Lip-Prot-Amyl, (CREON) 24000-76000 units CPEP TAKE ONE CAPSULE BY MOUTH 3 TIMES A DAY WITH FOOD 270 capsule 3   polyethylene glycol powder (GAVILAX) 17 GM/SCOOP powder MIX 1 CAPFUL (17 GRAMS) AS DIRECTED AND DRINK TWICE A DAY AS NEEDED FOR MODERATE CONSTIPATION 510 g 5   propranolol ER (INDERAL LA) 120 MG  24 hr capsule Take 1 capsule (120 mg total) by mouth daily. 90 capsule 3   RESTASIS 0.05 % ophthalmic emulsion Place 1 drop into both eyes 2 (two) times daily.      senna-docusate (SENOKOT-S) 8.6-50 MG tablet Take 2 tablets by mouth at bedtime. For AFTER surgery, do not take if having diarrhea 30 tablet 0   topiramate (TOPAMAX) 50 MG tablet Take 50 mg by mouth at bedtime.   1   traMADol (ULTRAM) 50 MG tablet Take 1 tablet (50 mg total) by mouth every 6 (six) hours as needed for severe pain. For AFTER surgery only, do not take and drive 10 tablet 0   Vitamin A 2400 MCG (8000 UT) CAPS Take 2,400 mcg by mouth daily.     No facility-administered medications prior to visit.    ROS: Review of Systems  Constitutional:  Positive for fatigue. Negative for activity change, appetite change, chills and unexpected weight change.  HENT:  Negative for congestion, mouth sores and sinus pressure.   Eyes:  Negative for visual disturbance.  Respiratory:  Negative for cough and chest tightness.   Gastrointestinal:  Negative for abdominal pain and nausea.  Genitourinary:  Negative for difficulty urinating, frequency and vaginal pain.  Musculoskeletal:  Positive for arthralgias, back pain and gait problem.  Skin:  Negative for pallor and rash.  Neurological:  Negative for dizziness, tremors, weakness, numbness and headaches.  Psychiatric/Behavioral:  Negative for confusion, sleep disturbance and suicidal ideas. The patient is nervous/anxious.    Objective:  BP (!) 142/82 (BP Location: Left Arm)   Pulse (!) 54   Temp 98 F (36.7 C) (Oral)   Wt 130 lb 9.6 oz (59.2 kg)   SpO2 97%   BMI 25.51 kg/m   BP Readings from Last 3 Encounters:  08/08/21 (!) 142/82  08/07/21 (!) 185/89  07/18/21 (!) 148/88    Wt Readings from Last 3 Encounters:  08/08/21 130 lb 9.6 oz (59.2 kg)  08/07/21 127 lb 6.4 oz (57.8 kg)  07/31/21 129 lb 11.2 oz (58.8 kg)    Physical Exam Constitutional:      General: She is not in  acute distress.    Appearance: Normal appearance. She is well-developed.  HENT:     Head: Normocephalic.     Right Ear: External ear normal.     Left Ear: External ear normal.     Nose: Nose normal.  Eyes:     General:        Right eye: No discharge.        Left eye: No discharge.     Conjunctiva/sclera: Conjunctivae normal.     Pupils: Pupils are equal, round, and reactive to light.  Neck:     Thyroid: No thyromegaly.     Vascular: No JVD.     Trachea: No tracheal deviation.  Cardiovascular:     Rate and Rhythm: Normal rate and regular rhythm.     Heart sounds: Normal heart sounds.  Pulmonary:     Effort: No respiratory distress.     Breath sounds: No stridor. No wheezing.  Abdominal:     General: Bowel sounds are normal. There is no distension.     Palpations: Abdomen is soft. There is no mass.     Tenderness: There is no abdominal tenderness. There is no guarding or rebound.  Musculoskeletal:        General: No tenderness.     Cervical back: Normal range of motion and neck supple. No rigidity.  Lymphadenopathy:     Cervical: No cervical adenopathy.  Skin:    Findings: No erythema or rash.  Neurological:     Mental Status: She is oriented to person, place, and time.     Cranial Nerves: No cranial nerve deficit.     Motor: No abnormal muscle tone.     Coordination: Coordination abnormal.     Deep Tendon Reflexes: Reflexes normal.  Psychiatric:        Behavior: Behavior normal.        Thought Content: Thought content normal.        Judgment: Judgment normal.   AKs  Lab Results  Component Value Date   WBC 4.7 05/06/2021   HGB 11.8 (L) 05/06/2021   HCT 34.8 (L) 05/06/2021   PLT 231.0 05/06/2021   GLUCOSE 97 05/06/2021   CHOL 238 (H) 05/06/2021   TRIG 106.0 05/06/2021   HDL 65.10 05/06/2021   LDLDIRECT 143.0 03/02/2018   LDLCALC 152 (H) 05/06/2021   ALT 15 05/06/2021   AST 17 05/06/2021   NA 138 05/06/2021   K 4.6 05/06/2021   CL 104 05/06/2021    CREATININE 0.72 05/06/2021   BUN 18 05/06/2021   CO2 28 05/06/2021   TSH 1.39 05/06/2021   INR 1.0 02/23/2020   HGBA1C 5.9 09/29/2007  No results found.  Assessment & Plan:     Walker Kehr, MD

## 2021-08-08 NOTE — Assessment & Plan Note (Signed)
Sophia Santos was dx'd w/endometrial cancer - discussed. Dr Denman George will do surgery on 08/21/21 Lorazepam prn  Potential benefits of a long term benzodiazepines  use as well as potential risks  and complications were explained to the patient and were aknowledged.

## 2021-08-09 NOTE — Patient Instructions (Signed)
Preparing for your Surgery   Plan for surgery on August 21, 2021 with Dr. Everitt Amber at [facility]. You will be scheduled for a robotic assisted total laparoscopic hysterectomy (removal of the uterus and cervix), bilateral salpingo-oophorectomy (removal of both ovaries and fallopian tubes), sentinel lymph node biopsy, possible lymph node dissection.    Pre-operative Testing -You will receive a phone call from presurgical testing at [facility] to arrange for a pre-operative appointment and lab work.   -Bring your insurance card, copy of an advanced directive if applicable, medication list   -At that visit, you will be asked to sign a consent for a possible blood transfusion in case a transfusion becomes necessary during surgery.  The need for a blood transfusion is rare but having consent is a necessary part of your care.      -You should not be taking blood thinners or aspirin at least ten days prior to surgery unless instructed by your surgeon. Do not resume your aggrenox until advised by Dr. Denman George after surgery.   -Do not take supplements such as fish oil (omega 3), red yeast rice, turmeric before your surgery. You want to avoid medications with aspirin in them including headache powders such as BC or Goody's), Excedrin migraine.   Day Before Surgery at [place] will be asked to take in a light diet the day before surgery. You will be advised you can have clear liquids up until 3 hours before your surgery.     Eat a light diet the day before surgery.  Examples including soups, broths, toast, yogurt, mashed potatoes.  AVOID GAS PRODUCING FOODS. Things to avoid include carbonated beverages (fizzy beverages, sodas), raw fruits and raw vegetables (uncooked), or beans.    If your bowels are filled with gas, your surgeon will have difficulty visualizing your pelvic organs which increases your surgical risks.   Your role in recovery Your role is to become active  as soon as directed by your doctor, while still giving yourself time to heal.  Rest when you feel tired. You will be asked to do the following in order to speed your recovery:   - Cough and breathe deeply. This helps to clear and expand your lungs and can prevent pneumonia after surgery.  - [place]. Do mild physical activity. Walking or moving your legs help your circulation and body functions return to normal. Do not try to get up or walk alone the first time after surgery.   -If you develop swelling on one leg or the other, pain in the back of your leg, redness/warmth in one of your legs, please call the office or go to the Emergency Room to have a doppler to rule out a blood clot. For shortness of breath, chest pain-seek care in the Emergency Room as soon as possible. - Actively manage your pain. Managing your pain lets you move in comfort. We will ask you to rate your pain on a scale of zero to 10. It is your responsibility to tell your doctor or nurse where and how much you hurt so your pain can be treated.   Special Considerations -If you are diabetic, you may be placed on insulin after surgery to have closer control over your blood sugars to promote healing and recovery.  This does not mean that you will be discharged on insulin.  If applicable, your oral antidiabetics will be resumed when you are tolerating a solid diet.   -  Your final pathology results from surgery should be available around one week after surgery and the results will be relayed to you when available.   -FMLA forms can be faxed to [phone] and please allow 5-7 business days for completion.   Pain Management After Surgery -You have been prescribed your pain medication and bowel regimen medications before surgery so that you can have these available when you are discharged from the hospital. The pain medication is for use ONLY AFTER surgery and a new prescription will not be given.    -Make sure that  you have Tylenol and Ibuprofen (200-400 mg) at home to use on a regular basis after surgery for pain control. We recommend alternating the medications every hour to six hours since they work differently and are processed in the body differently for pain relief.   -Review the attached handout on narcotic use and their risks and side effects.    Bowel Regimen -You have been prescribed Sennakot-S to take nightly to prevent constipation especially if you are taking the narcotic pain medication intermittently.  It is important to prevent constipation and drink adequate amounts of liquids. You can stop taking this medication when you are not taking pain medication and you are back on your normal bowel routine.   Risks of Surgery Risks of surgery are low but include bleeding, infection, damage to surrounding structures, re-operation, blood clots, and very rarely death.     Blood Transfusion Information (For the consent to be signed before surgery)   We will be checking your blood type before surgery so in case of emergencies, we will know what type of blood you would need.                                             WHAT IS A BLOOD TRANSFUSION?   A transfusion is the replacement of blood or some of its parts. Blood is made up of multiple cells which provide different functions. Red blood cells carry oxygen and are used for blood loss replacement. White blood cells fight against infection. Platelets control bleeding. Plasma helps clot blood. Other blood products are available for specialized needs, such as hemophilia or other clotting disorders. BEFORE THE TRANSFUSION  Who gives blood for transfusions?  You may be able to donate blood to be used at a later date on yourself (autologous donation). Relatives can be asked to donate blood. This is generally not any safer than if you have received blood from a stranger. The same precautions are taken to ensure safety when a relative's blood is  donated. Healthy volunteers who are fully evaluated to make sure their blood is safe. This is blood bank blood. Transfusion therapy is the safest it has ever been in the practice of medicine. Before blood is taken from a donor, a complete history is taken to make sure that person has no history of diseases nor engages in risky social behavior (examples are intravenous drug use or sexual activity with multiple partners). The donor's travel history is screened to minimize risk of transmitting infections, such as malaria. The donated blood is tested for signs of infectious diseases, such as HIV and hepatitis. The blood is then tested to be sure it is compatible with you in order to minimize the chance of a transfusion reaction. If you or a relative donates blood, this is often done in anticipation  of surgery and is not appropriate for emergency situations. It takes many days to process the donated blood. RISKS AND COMPLICATIONS Although transfusion therapy is very safe and saves many lives, the main dangers of transfusion include:  Getting an infectious disease. Developing a transfusion reaction. This is an allergic reaction to something in the blood you were given. Every precaution is taken to prevent this. The decision to have a blood transfusion has been considered carefully by your caregiver before blood is given. Blood is not given unless the benefits outweigh the risks.   AFTER SURGERY INSTRUCTIONS   Return to work: 4 weeks if applicable   Activity: 1. Be up and out of the bed during the day.  Take a nap if needed.  You may walk up steps but be careful and use the hand rail.  Stair climbing will tire you more than you think, you may need to stop part way and rest.    2. No lifting or straining for 6 weeks over 10 pounds. No pushing, pulling, straining for 6 weeks.   3. No driving for 1 week(s).  Do not drive if you are taking narcotic pain medicine and make sure that your reaction time has  returned.    4. You can shower as soon as the next day after surgery. Shower daily.  Use your regular soap and water (not directly on the incision) and pat your incision(s) dry afterwards; don't rub.  No tub baths or submerging your body in water until cleared by your surgeon. If you have the soap that was given to you by pre-surgical testing that was used before surgery, you do not need to use it afterwards because this can irritate your incisions.    5. No sexual activity and nothing in the vagina for 8 weeks.   6. You may experience a small amount of clear drainage from your incisions, which is normal.  If the drainage persists, increases, or changes color please call the office.   7. Do not use creams, lotions, or ointments such as neosporin on your incisions after surgery until advised by your surgeon because they can cause removal of the dermabond glue on your incisions.     8. You may experience vaginal spotting after surgery or around the 6-8 week mark from surgery when the stitches at the top of the vagina begin to dissolve.  The spotting is normal but if you experience heavy bleeding, call our office.   9. Take Tylenol or ibuprofen first for pain and only use narcotic pain medication for severe pain not relieved by the Tylenol or Ibuprofen.  Monitor your Tylenol intake to a max of 4,000 mg in a 24 hour period. You can alternate these medications after surgery.   Diet: 1. Low sodium Heart Healthy Diet is recommended but you are cleared to resume your normal (before surgery) diet after your procedure.   2. It is safe to use a laxative, such as Miralax or Colace, if you have difficulty moving your bowels. You have been prescribed Sennakot at bedtime every evening to keep bowel movements regular and to prevent constipation.     Wound Care: 1. Keep clean and dry.  Shower daily.   Reasons to call the Doctor: Fever - Oral temperature greater than 100.4 degrees Fahrenheit Foul-smelling  vaginal discharge Difficulty urinating Nausea and vomiting Increased pain at the site of the incision that is unrelieved with pain medicine. Difficulty breathing with or without chest pain New calf pain especially  if only on one side Sudden, continuing increased vaginal bleeding with or without clots.   Contacts: For questions or concerns you should contact:   Dr. Everitt Amber at [phone]   Joylene John, NP at [phone]   After Hours: call [phone] and have the GYN Oncologist paged/contacted (after 5 pm or on the weekends).   Messages sent via mychart are for non-urgent matters and are not responded to after hours so for urgent needs, please call the after hours number.

## 2021-08-11 NOTE — Assessment & Plan Note (Signed)
Cont w/Captopril Risks associated with treatment noncompliance were discussed. Compliance was encouraged.

## 2021-08-12 ENCOUNTER — Ambulatory Visit: Payer: Medicare Other

## 2021-08-12 ENCOUNTER — Telehealth: Payer: Self-pay

## 2021-08-12 NOTE — Telephone Encounter (Signed)
Received call from [facility] reporting she was in pain. Instructed patient I would call her with an interpreter. Patient verbalized understanding. Alexia interpreter # [phone] assisted with translation. Patient reported that she stopped taking her megace on 08/11/21 due to lower abdominal pain. She reports feeling somewhat better but then her vaginal bleeding returned today. She reports passing clots and having to change her pads frequently. Her lower abdominal pain has returned as well. She states tylenol does help with the pain. Per Joylene John, NP patient can try reducing the megace to one tablet twice a day and take with food. Patient states she is going to start with half a tablet at lunch and half a dinner to see how she feels. Instructed to continue taking tylenol for stomach discomfort. Patient verbalized understanding and will call with questions or concerns.

## 2021-08-14 ENCOUNTER — Encounter (HOSPITAL_BASED_OUTPATIENT_CLINIC_OR_DEPARTMENT_OTHER): Payer: Self-pay | Admitting: Gynecologic Oncology

## 2021-08-14 ENCOUNTER — Ambulatory Visit: Payer: Medicare Other

## 2021-08-15 ENCOUNTER — Encounter (HOSPITAL_BASED_OUTPATIENT_CLINIC_OR_DEPARTMENT_OTHER): Payer: Self-pay | Admitting: Gynecologic Oncology

## 2021-08-15 ENCOUNTER — Other Ambulatory Visit: Payer: Self-pay

## 2021-08-15 DIAGNOSIS — N950 Postmenopausal bleeding: Secondary | ICD-10-CM

## 2021-08-15 DIAGNOSIS — R519 Headache, unspecified: Secondary | ICD-10-CM

## 2021-08-15 DIAGNOSIS — R350 Frequency of micturition: Secondary | ICD-10-CM

## 2021-08-15 DIAGNOSIS — Z01818 Encounter for other preprocedural examination: Secondary | ICD-10-CM | POA: Diagnosis not present

## 2021-08-15 DIAGNOSIS — Z973 Presence of spectacles and contact lenses: Secondary | ICD-10-CM

## 2021-08-15 HISTORY — DX: Frequency of micturition: R35.0

## 2021-08-15 HISTORY — DX: Presence of spectacles and contact lenses: Z97.3

## 2021-08-15 HISTORY — DX: Headache, unspecified: R51.9

## 2021-08-15 HISTORY — DX: Postmenopausal bleeding: N95.0

## 2021-08-15 NOTE — Progress Notes (Addendum)
Spoke w/ via phone for pre-op interview---pt with r[place] interpreter [place] interpreters number [phone] Lab needs dos---- none              Lab results------lab appt 08-16-2021 1330 pm for cbc cmp t & s ua and ekg, echo 07-19-2020 epic COVID test -----patient states asymptomatic no test needed Arrive at -------630 am 08-21-2021 NPO after MN NO Solid Food  or  Clear liquids  after MN  Med rec completed Medications to take morning of surgery -----omeprazole, propranolol Diabetic medication -----n/a Patient instructed no nail polish to be worn day of surgery Patient instructed to bring photo id and insurance card day of surgery Patient aware to have Driver (ride ) / caregiver   son igor driver cell [id number] , pt aware and will arrange caregiver for 24 ours after surgery for 24 hours after surgery  Patient Special Instructions -----none Pre-Op special Istructions -----none Patient verbalized understanding of instructions that were given at this phone interview. Patient denies shortness of breath, chest pain, fever, cough at this phone interview. Pt stopped aggrenox she takes for headaches on 07-31-2021 per pt choice.   [place] female interpreter for [place], [place] on pt chart  Called louise at dr Denman George office to have office call pt and make sure she understands needs caregiver for 24 hours after surgery

## 2021-08-15 NOTE — Progress Notes (Signed)
Your procedure is scheduled on 08-21-2021  Report to [facility]   Call this number if you have problems the morning of surgery  :[phone].   OUR ADDRESS IS [address].  WE ARE LOCATED IN [facility].  PLEASE BRING YOUR INSURANCE CARD AND PHOTO ID DAY OF SURGERY.  ONLY ONE PERSON ALLOWED IN FACILITY WAITING AREA.                                     REMEMBER:  Eat a light diet the day before surgery.  Examples including soups, broths, toast, yogurt, mashed potatoes.  Things to avoid include carbonated beverages (fizzy beverages), raw fruits and raw vegetables, or beans.   If your bowels are filled with gas, your surgeon will have difficulty visualizing your pelvic organs which increases your surgical risks.    DO NOT EAT FOOD, CANDY GUM OR MINTS  OR ANY LIQUIDS AFTER MIDNIGHT    NIGHT BEFORE SURGERY.   YOU MAY  BRUSH YOUR TEETH MORNING OF SURGERY AND RINSE YOUR MOUTH OUT, NO CHEWING GUM CANDY OR MINTS.    __________________________________     TAKE THESE MEDICATIONS MORNING OF SURGERY WITH A SIP OF WATER:  OMEPRAZOLE, PROPRANOLOL.  ONE VISITOR IS ALLOWED IN WAITING ROOM ONLY DAY OF SURGERY.  NO VISITOR MAY SPEND THE NIGHT.  VISITOR ARE ALLOWED TO STAY UNTIL 800 PM.                                    DO NOT WEAR JEWERLY, MAKE UP. DO NOT WEAR LOTIONS, POWDERS, PERFUMES OR NAIL POLISH. DO NOT SHAVE FOR 48 HOURS PRIOR TO DAY OF SURGERY. MEN MAY SHAVE FACE AND NECK. CONTACTS, GLASSES, OR DENTURES MAY NOT BE WORN TO SURGERY.                                    [facility] IS NOT RESPONSIBLE  FOR ANY BELONGINGS.                                                                    Marland Kitchen           [facility] - Preparing for Surgery: [facility] WITH THE SURGICAL SOAP THE NIGHT BEFORE SURGERY AND THE MORNING OF YOUR SURGERY, DO NOT USE THE SURGICAL SOAP ON YOUR FACE HAIR OR PRIVATE AREAS, USE REGULAR SOAP ON THOSE AREAS. DO NOT USE THE  SURGICAL SOAP ON ANY OPEN OR IRRITATED SKIN.  IF YOU ARE UNABLE TO DO THE SOAP SHOWER MORNING OF SURGERY LET THE NURSE KNOW WHEN YOU ARRIVE. ________________________________________________________________________                                                        QUESTIONS Hansel Feinstein PRE OP NURSE PHONE [phone].

## 2021-08-16 ENCOUNTER — Other Ambulatory Visit: Payer: Self-pay

## 2021-08-16 ENCOUNTER — Encounter (HOSPITAL_COMMUNITY)
Admission: RE | Admit: 2021-08-16 | Discharge: 2021-08-16 | Disposition: A | Discharge status: Home / Self Care | Payer: Medicare Other | Source: Ambulatory Visit | Attending: Gynecologic Oncology | Admitting: Gynecologic Oncology

## 2021-08-16 ENCOUNTER — Telehealth: Payer: Self-pay

## 2021-08-16 DIAGNOSIS — C541 Malignant neoplasm of endometrium: Secondary | ICD-10-CM | POA: Diagnosis not present

## 2021-08-16 DIAGNOSIS — Z01818 Encounter for other preprocedural examination: Secondary | ICD-10-CM | POA: Diagnosis not present

## 2021-08-16 LAB — CBC
HCT: 26.9 % — ABNORMAL LOW (ref 36.0–46.0)
Hemoglobin: 9 g/dL — ABNORMAL LOW (ref 12.0–15.0)
MCH: 32.4 pg (ref 26.0–34.0)
MCHC: 33.5 g/dL (ref 30.0–36.0)
MCV: 96.8 fL (ref 80.0–100.0)
Platelets: 242 10*3/uL (ref 150–400)
RBC: 2.78 MIL/uL — ABNORMAL LOW (ref 3.87–5.11)
RDW: 13.1 % (ref 11.5–15.5)
WBC: 7 10*3/uL (ref 4.0–10.5)
nRBC: 0 % (ref 0.0–0.2)

## 2021-08-16 LAB — COMPREHENSIVE METABOLIC PANEL
ALT: 31 U/L (ref 0–44)
AST: 29 U/L (ref 15–41)
Albumin: 3.6 g/dL (ref 3.5–5.0)
Alkaline Phosphatase: 116 U/L (ref 38–126)
Anion gap: 5 (ref 5–15)
BUN: 27 mg/dL — ABNORMAL HIGH (ref 8–23)
CO2: 25 mmol/L (ref 22–32)
Calcium: 8.8 mg/dL — ABNORMAL LOW (ref 8.9–10.3)
Chloride: 102 mmol/L (ref 98–111)
Creatinine, Ser: 0.72 mg/dL (ref 0.44–1.00)
GFR, Estimated: >60 mL/min (ref >60)
Glucose, Bld: 120 mg/dL — ABNORMAL HIGH (ref 70–99)
Potassium: 4.1 mmol/L (ref 3.5–5.1)
Sodium: 132 mmol/L — ABNORMAL LOW (ref 135–145)
Total Bilirubin: 0.7 mg/dL (ref 0.3–1.2)
Total Protein: 6.7 g/dL (ref 6.5–8.1)

## 2021-08-16 LAB — URINALYSIS, ROUTINE W REFLEX MICROSCOPIC
Bacteria, UA: NONE SEEN
Bilirubin Urine: NEGATIVE
Glucose, UA: NEGATIVE mg/dL
Ketones, ur: 15 mg/dL — AB
Leukocytes,Ua: NEGATIVE
Nitrite: NEGATIVE
Protein, ur: NEGATIVE mg/dL
Specific Gravity, Urine: 1.015 (ref 1.005–1.030)
pH: 6 (ref 5.0–8.0)

## 2021-08-16 NOTE — Telephone Encounter (Signed)
Told daughter-in law Sophia Santos that Sophia Santos's surgery was at 0830 on 08-21-21 with Dr. Denman George. She is to arrive at [place] on 08-21-21 at the [facility] surgery center. She has an appointment today at 1330 at [facility] pre-op in the main hospital for labs and pick up written instructions and soap for pre-op shower. Sophia Santos states that Sophia Santos said that she was to go 08-20-21 to the hospital. Sophia Santos to see if her husband can bring her today at 1330 at [place].

## 2021-08-16 NOTE — Progress Notes (Signed)
Sent hemaglobin 9.0 result to [facility] np via secure chat

## 2021-08-20 ENCOUNTER — Telehealth: Payer: Self-pay

## 2021-08-20 NOTE — Telephone Encounter (Signed)
Returning call to son Vilinda Flake, left message stating what time patient is to arrive to the hospital tomorrow and that she is to be NPO after midnight. Per Zelphia Cairo, RN note patient is to take omeprazole and propranolol in the morning with a sip of water. Instructed to call [phone] in the morning if they have any questions.

## 2021-08-20 NOTE — Telephone Encounter (Signed)
Left message with assistance of russian interpreter [place]zu [phone]. Instructed patient to call office at [phone] to review pre-op instructions.

## 2021-08-20 NOTE — Anesthesia Preprocedure Evaluation (Addendum)
Anesthesia Evaluation  Patient identified by MRN, date of birth, ID band Patient awake    Reviewed: Allergy & Precautions, NPO status , Patient's Chart, lab work & pertinent test results, reviewed documented beta blocker date and time   History of Anesthesia Complications (+) PROLONGED EMERGENCE and history of anesthetic complications  Airway Mallampati: II  TM Distance: >3 FB Neck ROM: Full    Dental  (+) Dental Advisory Given, Partial Upper, Lower Dentures   Pulmonary neg pulmonary ROS,    Pulmonary exam normal breath sounds clear to auscultation       Cardiovascular hypertension, Pt. on medications and Pt. on home beta blockers Normal cardiovascular exam Rhythm:Regular Rate:Normal     Neuro/Psych  Headaches, PSYCHIATRIC DISORDERS Anxiety    GI/Hepatic Neg liver ROS, GERD  Medicated,  Endo/Other  negative endocrine ROS  Renal/GU negative Renal ROS     Musculoskeletal  (+) Arthritis ,   Abdominal   Peds  Hematology  (+) Blood dyscrasia, anemia ,   Anesthesia Other Findings   Reproductive/Obstetrics ENDOMETRIAL CANCER                            Anesthesia Physical Anesthesia Plan  ASA: 3  Anesthesia Plan: General   Post-op Pain Management:    Induction: Intravenous  PONV Risk Score and Plan: 4 or greater and Dexamethasone, Ondansetron and Treatment may vary due to age or medical condition  Airway Management Planned: Oral ETT  Additional Equipment:   Intra-op Plan:   Post-operative Plan: Extubation in OR  Informed Consent: I have reviewed the patients History and Physical, chart, labs and discussed the procedure including the risks, benefits and alternatives for the proposed anesthesia with the patient or authorized representative who has indicated his/her understanding and acceptance.     Dental advisory given and Interpreter used for interveiw  Plan Discussed with:  CRNA  Anesthesia Plan Comments: (2nd PIV after induction )       Anesthesia Quick Evaluation

## 2021-08-21 ENCOUNTER — Other Ambulatory Visit: Payer: Self-pay

## 2021-08-21 ENCOUNTER — Ambulatory Visit (HOSPITAL_BASED_OUTPATIENT_CLINIC_OR_DEPARTMENT_OTHER)
Admission: RE | Admit: 2021-08-21 | Discharge: 2021-08-21 | Disposition: A | Discharge status: Home / Self Care | Payer: Medicare Other | Attending: Gynecologic Oncology | Admitting: Gynecologic Oncology

## 2021-08-21 ENCOUNTER — Telehealth: Payer: Self-pay

## 2021-08-21 ENCOUNTER — Encounter (HOSPITAL_BASED_OUTPATIENT_CLINIC_OR_DEPARTMENT_OTHER): Payer: Self-pay | Admitting: Gynecologic Oncology

## 2021-08-21 ENCOUNTER — Ambulatory Visit (HOSPITAL_BASED_OUTPATIENT_CLINIC_OR_DEPARTMENT_OTHER): Payer: Medicare Other | Admitting: Anesthesiology

## 2021-08-21 ENCOUNTER — Encounter (HOSPITAL_BASED_OUTPATIENT_CLINIC_OR_DEPARTMENT_OTHER)
Admission: RE | Disposition: A | Discharge status: Home / Self Care | Payer: Self-pay | Source: Home / Self Care | Attending: Gynecologic Oncology

## 2021-08-21 DIAGNOSIS — Z888 Allergy status to other drugs, medicaments and biological substances status: Secondary | ICD-10-CM | POA: Insufficient documentation

## 2021-08-21 DIAGNOSIS — D500 Iron deficiency anemia secondary to blood loss (chronic): Secondary | ICD-10-CM | POA: Insufficient documentation

## 2021-08-21 DIAGNOSIS — Z9889 Other specified postprocedural states: Secondary | ICD-10-CM | POA: Insufficient documentation

## 2021-08-21 DIAGNOSIS — N814 Uterovaginal prolapse, unspecified: Secondary | ICD-10-CM | POA: Diagnosis not present

## 2021-08-21 DIAGNOSIS — Z85828 Personal history of other malignant neoplasm of skin: Secondary | ICD-10-CM | POA: Diagnosis not present

## 2021-08-21 DIAGNOSIS — Z79899 Other long term (current) drug therapy: Secondary | ICD-10-CM | POA: Insufficient documentation

## 2021-08-21 DIAGNOSIS — E785 Hyperlipidemia, unspecified: Secondary | ICD-10-CM | POA: Insufficient documentation

## 2021-08-21 DIAGNOSIS — I10 Essential (primary) hypertension: Secondary | ICD-10-CM | POA: Diagnosis not present

## 2021-08-21 DIAGNOSIS — R238 Other skin changes: Secondary | ICD-10-CM | POA: Diagnosis not present

## 2021-08-21 DIAGNOSIS — Z79890 Hormone replacement therapy (postmenopausal): Secondary | ICD-10-CM | POA: Insufficient documentation

## 2021-08-21 DIAGNOSIS — Z886 Allergy status to analgesic agent status: Secondary | ICD-10-CM | POA: Insufficient documentation

## 2021-08-21 DIAGNOSIS — C541 Malignant neoplasm of endometrium: Secondary | ICD-10-CM | POA: Insufficient documentation

## 2021-08-21 HISTORY — PX: ROBOTIC ASSISTED TOTAL HYSTERECTOMY WITH BILATERAL SALPINGO OOPHERECTOMY: SHX6086

## 2021-08-21 HISTORY — DX: Malignant neoplasm of endometrium: C54.1

## 2021-08-21 HISTORY — PX: SENTINEL NODE BIOPSY: SHX6608

## 2021-08-21 LAB — POCT I-STAT, CHEM 8
BUN: 19 mg/dL (ref 8–23)
Calcium, Ion: 1.16 mmol/L (ref 1.15–1.40)
Chloride: 104 mmol/L (ref 98–111)
Creatinine, Ser: 0.7 mg/dL (ref 0.44–1.00)
Glucose, Bld: 91 mg/dL (ref 70–99)
HCT: 20 % — ABNORMAL LOW (ref 36.0–46.0)
Hemoglobin: 6.8 g/dL — CL (ref 12.0–15.0)
Potassium: 3.8 mmol/L (ref 3.5–5.1)
Sodium: 137 mmol/L (ref 135–145)
TCO2: 20 mmol/L — ABNORMAL LOW (ref 22–32)

## 2021-08-21 LAB — PREPARE RBC (CROSSMATCH)

## 2021-08-21 LAB — ABO/RH: ABO/RH(D): O POS

## 2021-08-21 SURGERY — HYSTERECTOMY, TOTAL, ROBOT-ASSISTED, LAPAROSCOPIC, WITH BILATERAL SALPINGO-OOPHORECTOMY
Anesthesia: General | Site: Abdomen

## 2021-08-21 MED ORDER — ONDANSETRON HCL 4 MG/2ML IJ SOLN
4.0000 mg | Freq: Once | INTRAMUSCULAR | Status: AC
  Administered 2021-08-21: 4 mg via INTRAVENOUS

## 2021-08-21 MED ORDER — AMISULPRIDE (ANTIEMETIC) 5 MG/2ML IV SOLN
INTRAVENOUS | Status: AC
  Filled 2021-08-21: qty 4

## 2021-08-21 MED ORDER — FENTANYL CITRATE (PF) 100 MCG/2ML IJ SOLN
INTRAMUSCULAR | Status: AC
  Filled 2021-08-21: qty 2

## 2021-08-21 MED ORDER — ALBUMIN HUMAN 5 % IV SOLN
INTRAVENOUS | Status: AC
  Filled 2021-08-21: qty 250

## 2021-08-21 MED ORDER — BUPIVACAINE HCL 0.25 % IJ SOLN
INTRAMUSCULAR | Status: DC | PRN
  Administered 2021-08-21: 20 mL

## 2021-08-21 MED ORDER — CEFAZOLIN SODIUM-DEXTROSE 2-4 GM/100ML-% IV SOLN
INTRAVENOUS | Status: AC
  Filled 2021-08-21: qty 100

## 2021-08-21 MED ORDER — LACTATED RINGERS IV SOLN: INTRAVENOUS | Status: DC

## 2021-08-21 MED ORDER — ACETAMINOPHEN 325 MG PO TABS
650.0000 mg | ORAL_TABLET | Freq: Once | ORAL | Status: AC
  Administered 2021-08-21: 650 mg via ORAL

## 2021-08-21 MED ORDER — ONDANSETRON HCL 4 MG/2ML IJ SOLN
INTRAMUSCULAR | Status: DC | PRN
  Administered 2021-08-21: 4 mg via INTRAVENOUS

## 2021-08-21 MED ORDER — PROPOFOL 10 MG/ML IV BOLUS
INTRAVENOUS | Status: AC
  Filled 2021-08-21: qty 20

## 2021-08-21 MED ORDER — ONDANSETRON HCL 4 MG/2ML IJ SOLN
INTRAMUSCULAR | Status: AC
  Filled 2021-08-21: qty 2

## 2021-08-21 MED ORDER — LABETALOL HCL 5 MG/ML IV SOLN
7.5000 mg | Freq: Once | INTRAVENOUS | Status: AC
  Administered 2021-08-21: 7.5 mg via INTRAVENOUS

## 2021-08-21 MED ORDER — ACETAMINOPHEN 325 MG PO TABS
ORAL_TABLET | ORAL | Status: AC
  Filled 2021-08-21: qty 2

## 2021-08-21 MED ORDER — AMISULPRIDE (ANTIEMETIC) 5 MG/2ML IV SOLN
10.0000 mg | Freq: Once | INTRAVENOUS | Status: AC | PRN
  Administered 2021-08-21: 10 mg via INTRAVENOUS

## 2021-08-21 MED ORDER — ENOXAPARIN SODIUM 40 MG/0.4ML IJ SOSY
PREFILLED_SYRINGE | INTRAMUSCULAR | Status: AC
  Filled 2021-08-21: qty 0.4

## 2021-08-21 MED ORDER — ROCURONIUM BROMIDE 10 MG/ML (PF) SYRINGE
PREFILLED_SYRINGE | INTRAVENOUS | Status: DC | PRN
Start: 2021-08-21 — End: 2021-08-21
  Administered 2021-08-21: 50 mg via INTRAVENOUS

## 2021-08-21 MED ORDER — LIDOCAINE 2% (20 MG/ML) 5 ML SYRINGE
INTRAMUSCULAR | Status: DC | PRN
  Administered 2021-08-21: 60 mg via INTRAVENOUS

## 2021-08-21 MED ORDER — ACETAMINOPHEN 500 MG PO TABS
1000.0000 mg | ORAL_TABLET | ORAL | Status: AC
  Administered 2021-08-21: 1000 mg via ORAL

## 2021-08-21 MED ORDER — LABETALOL HCL 5 MG/ML IV SOLN
INTRAVENOUS | Status: AC
  Filled 2021-08-21: qty 4

## 2021-08-21 MED ORDER — PHENYLEPHRINE HCL (PRESSORS) 10 MG/ML IV SOLN
INTRAVENOUS | Status: AC
  Filled 2021-08-21: qty 1

## 2021-08-21 MED ORDER — LABETALOL HCL 5 MG/ML IV SOLN
5.0000 mg | INTRAVENOUS | Status: DC | PRN
  Administered 2021-08-21: 5 mg via INTRAVENOUS

## 2021-08-21 MED ORDER — PHENYLEPHRINE 40 MCG/ML (10ML) SYRINGE FOR IV PUSH (FOR BLOOD PRESSURE SUPPORT)
PREFILLED_SYRINGE | INTRAVENOUS | Status: DC | PRN
  Administered 2021-08-21: 80 ug via INTRAVENOUS
  Administered 2021-08-21: 160 ug via INTRAVENOUS
  Administered 2021-08-21: 80 ug via INTRAVENOUS

## 2021-08-21 MED ORDER — SODIUM CHLORIDE 0.9 % IR SOLN
Status: DC | PRN
  Administered 2021-08-21: 1000 mL

## 2021-08-21 MED ORDER — ROCURONIUM BROMIDE 10 MG/ML (PF) SYRINGE
PREFILLED_SYRINGE | INTRAVENOUS | Status: AC
  Filled 2021-08-21: qty 10

## 2021-08-21 MED ORDER — STERILE WATER FOR INJECTION IJ SOLN
INTRAMUSCULAR | Status: DC | PRN
  Administered 2021-08-21: 10 mL

## 2021-08-21 MED ORDER — DEXAMETHASONE SODIUM PHOSPHATE 10 MG/ML IJ SOLN
INTRAMUSCULAR | Status: AC
  Filled 2021-08-21: qty 1

## 2021-08-21 MED ORDER — CEFAZOLIN SODIUM-DEXTROSE 2-4 GM/100ML-% IV SOLN
2.0000 g | INTRAVENOUS | Status: AC
  Administered 2021-08-21: 2 g via INTRAVENOUS

## 2021-08-21 MED ORDER — PHENYLEPHRINE HCL-NACL 20-0.9 MG/250ML-% IV SOLN
INTRAVENOUS | Status: DC | PRN
  Administered 2021-08-21: 50 ug/min via INTRAVENOUS

## 2021-08-21 MED ORDER — KETOROLAC TROMETHAMINE 30 MG/ML IJ SOLN
INTRAMUSCULAR | Status: AC
  Filled 2021-08-21: qty 1

## 2021-08-21 MED ORDER — SODIUM CHLORIDE 0.9 % IV SOLN: INTRAVENOUS | Status: DC | PRN

## 2021-08-21 MED ORDER — PROPOFOL 10 MG/ML IV BOLUS
INTRAVENOUS | Status: DC | PRN
  Administered 2021-08-21: 80 mg via INTRAVENOUS

## 2021-08-21 MED ORDER — ENOXAPARIN SODIUM 40 MG/0.4ML IJ SOSY
40.0000 mg | PREFILLED_SYRINGE | INTRAMUSCULAR | Status: AC
  Administered 2021-08-21: 40 mg via SUBCUTANEOUS

## 2021-08-21 MED ORDER — LABETALOL HCL 5 MG/ML IV SOLN
5.0000 mg | INTRAVENOUS | Status: AC | PRN
  Administered 2021-08-21 (×2): 5 mg via INTRAVENOUS

## 2021-08-21 MED ORDER — ACETAMINOPHEN 500 MG PO TABS
ORAL_TABLET | ORAL | Status: AC
  Filled 2021-08-21: qty 2

## 2021-08-21 MED ORDER — DEXAMETHASONE SODIUM PHOSPHATE 10 MG/ML IJ SOLN
INTRAMUSCULAR | Status: DC | PRN
  Administered 2021-08-21: 5 mg via INTRAVENOUS

## 2021-08-21 MED ORDER — SODIUM CHLORIDE 0.9% IV SOLUTION: Freq: Once | INTRAVENOUS | Status: DC

## 2021-08-21 MED ORDER — 0.9 % SODIUM CHLORIDE (POUR BTL) OPTIME
TOPICAL | Status: DC | PRN
  Administered 2021-08-21: 500 mL

## 2021-08-21 MED ORDER — ONDANSETRON HCL 4 MG/2ML IJ SOLN
4.0000 mg | Freq: Once | INTRAMUSCULAR | Status: AC | PRN
  Administered 2021-08-21: 4 mg via INTRAVENOUS

## 2021-08-21 MED ORDER — SUGAMMADEX SODIUM 200 MG/2ML IV SOLN
INTRAVENOUS | Status: DC | PRN
  Administered 2021-08-21: 150 mg via INTRAVENOUS

## 2021-08-21 MED ORDER — LIDOCAINE 2% (20 MG/ML) 5 ML SYRINGE
INTRAMUSCULAR | Status: AC
  Filled 2021-08-21: qty 5

## 2021-08-21 MED ORDER — FENTANYL CITRATE (PF) 100 MCG/2ML IJ SOLN
25.0000 ug | INTRAMUSCULAR | Status: AC | PRN
  Administered 2021-08-21 (×6): 25 ug via INTRAVENOUS

## 2021-08-21 MED ORDER — DEXAMETHASONE SODIUM PHOSPHATE 10 MG/ML IJ SOLN: 4.0000 mg | INTRAMUSCULAR | Status: DC

## 2021-08-21 MED ORDER — KETOROLAC TROMETHAMINE 15 MG/ML IJ SOLN
INTRAMUSCULAR | Status: DC | PRN
  Administered 2021-08-21: 15 mg via INTRAVENOUS

## 2021-08-21 MED ORDER — PHENYLEPHRINE 40 MCG/ML (10ML) SYRINGE FOR IV PUSH (FOR BLOOD PRESSURE SUPPORT)
PREFILLED_SYRINGE | INTRAVENOUS | Status: AC
  Filled 2021-08-21: qty 10

## 2021-08-21 MED ORDER — FENTANYL CITRATE (PF) 100 MCG/2ML IJ SOLN
INTRAMUSCULAR | Status: DC | PRN
  Administered 2021-08-21: 50 ug via INTRAVENOUS
  Administered 2021-08-21: 25 ug via INTRAVENOUS
  Administered 2021-08-21: 50 ug via INTRAVENOUS
  Administered 2021-08-21: 25 ug via INTRAVENOUS

## 2021-08-21 SURGICAL SUPPLY — 62 items
COVER BACK TABLE 60X90IN (DRAPES) ×4 IMPLANT
COVER TIP SHEARS 8 DVNC (MISCELLANEOUS) ×2 IMPLANT
COVER TIP SHEARS 8MM DA VINCI (MISCELLANEOUS) ×4
DERMABOND ADVANCED (GAUZE/BANDAGES/DRESSINGS) ×2
DERMABOND ADVANCED .7 DNX12 (GAUZE/BANDAGES/DRESSINGS) ×2 IMPLANT
DRAPE ARM DVNC X/XI (DISPOSABLE) ×8 IMPLANT
DRAPE COLUMN DVNC XI (DISPOSABLE) ×2 IMPLANT
DRAPE DA VINCI XI ARM (DISPOSABLE) ×16
DRAPE DA VINCI XI COLUMN (DISPOSABLE) ×4
DRAPE SHEET LG 3/4 BI-LAMINATE (DRAPES) ×4 IMPLANT
DRAPE SURG IRRIG POUCH 19X23 (DRAPES) ×4 IMPLANT
ELECT REM PT RETURN 9FT ADLT (ELECTROSURGICAL) ×4
ELECTRODE REM PT RTRN 9FT ADLT (ELECTROSURGICAL) ×2 IMPLANT
GAUZE 4X4 16PLY ~~LOC~~+RFID DBL (SPONGE) ×8 IMPLANT
GLOVE SURG ENC MOIS LTX SZ6 (GLOVE) ×16 IMPLANT
GLOVE SURG ENC MOIS LTX SZ6.5 (GLOVE) ×16 IMPLANT
GLOVE SURG UNDER POLY LF SZ6.5 (GLOVE) ×8 IMPLANT
GLOVE SURG UNDER POLY LF SZ7 (GLOVE) ×16 IMPLANT
HOLDER FOLEY CATH W/STRAP (MISCELLANEOUS) ×4 IMPLANT
IRRIG SUCT STRYKERFLOW 2 WTIP (MISCELLANEOUS) ×4
IRRIGATION SUCT STRKRFLW 2 WTP (MISCELLANEOUS) ×2 IMPLANT
IV NS 1000ML (IV SOLUTION) ×4
IV NS 1000ML BAXH (IV SOLUTION) ×2 IMPLANT
KIT PROCEDURE DA VINCI SI (MISCELLANEOUS) ×4
KIT PROCEDURE DVNC SI (MISCELLANEOUS) ×2 IMPLANT
KIT TURNOVER CYSTO (KITS) ×4 IMPLANT
LEGGING LITHOTOMY PAIR STRL (DRAPES) ×4 IMPLANT
MANIFOLD NEPTUNE II (INSTRUMENTS) ×4 IMPLANT
MANIPULATOR ADVINCU DEL 3.0 PL (MISCELLANEOUS) ×4 IMPLANT
MANIPULATOR ADVINCU DEL 3.5 PL (MISCELLANEOUS) ×4 IMPLANT
NDL SAFETY ECLIPSE 18X1.5 (NEEDLE) ×2 IMPLANT
NEEDLE HYPO 18GX1.5 SHARP (NEEDLE) ×4
NEEDLE HYPO 22GX1.5 SAFETY (NEEDLE) ×4 IMPLANT
NEEDLE SPNL 18GX3.5 QUINCKE PK (NEEDLE) ×4 IMPLANT
NS IRRIG 500ML POUR BTL (IV SOLUTION) ×4 IMPLANT
OBTURATOR OPTICAL STANDARD 8MM (TROCAR) ×4
OBTURATOR OPTICAL STND 8 DVNC (TROCAR) ×2
OBTURATOR OPTICALSTD 8 DVNC (TROCAR) ×2 IMPLANT
PACK ROBOT GYN CUSTOM WL (TRAY / TRAY PROCEDURE) ×4 IMPLANT
PACK ROBOTIC GOWN (GOWN DISPOSABLE) ×4 IMPLANT
PAD POSITIONING PINK XL (MISCELLANEOUS) ×4 IMPLANT
PORT ACCESS TROCAR AIRSEAL 12 (TROCAR) ×2 IMPLANT
PORT ACCESS TROCAR AIRSEAL 5M (TROCAR) ×2
SEAL CANN UNIV 5-8 DVNC XI (MISCELLANEOUS) ×6 IMPLANT
SEAL XI 5MM-8MM UNIVERSAL (MISCELLANEOUS) ×12
SET TRI-LUMEN FLTR TB AIRSEAL (TUBING) ×4 IMPLANT
SUT MNCRL 0 1X36 CT-1 (SUTURE) IMPLANT
SUT MNCRL AB 4-0 PS2 18 (SUTURE) IMPLANT
SUT MON AB-0 CT1 36 (SUTURE) IMPLANT
SUT MONOCRYL 0 (SUTURE)
SUT VIC AB 0 CT1 36 (SUTURE) IMPLANT
SUT VIC AB 3-0 SH 27 (SUTURE)
SUT VIC AB 3-0 SH 27X BRD (SUTURE) IMPLANT
SUT VIC AB 4-0 PS2 18 (SUTURE) ×8 IMPLANT
SUT VLOC 180 0 9IN  GS21 (SUTURE) ×4
SUT VLOC 180 0 9IN GS21 (SUTURE) ×2 IMPLANT
SYR 10ML LL (SYRINGE) ×8 IMPLANT
SYR CONTROL 10ML LL (SYRINGE) ×4 IMPLANT
TRAY FOL W/BAG SLVR 16FR STRL (SET/KITS/TRAYS/PACK) ×2 IMPLANT
TRAY FOLEY W/BAG SLVR 16FR LF (SET/KITS/TRAYS/PACK) ×4
UNDERPAD 30X36 HEAVY ABSORB (UNDERPADS AND DIAPERS) ×4 IMPLANT
WATER STERILE IRR 1000ML POUR (IV SOLUTION) ×4 IMPLANT

## 2021-08-21 NOTE — Op Note (Signed)
OPERATIVE NOTE 08/21/21  Surgeon: Donaciano Eva   Assistants: Joylene John, NP (a provider assistant was necessary for tissue manipulation, management of robotic instrumentation, retraction and positioning due to the complexity of the case and hospital policies).   Anesthesia: General endotracheal anesthesia  ASA Class: 3   Pre-operative Diagnosis: endometrial cancer grade 1, chronic blood loss anemia  Post-operative Diagnosis: same,   Operation: Robotic-assisted laparoscopic total hysterectomy with bilateral salpingoophorectomy, SLN biopsy   Surgeon: Donaciano Eva  Assistant Surgeon: Joylene John, NP  Anesthesia: GET  Urine Output: 100cc  Operative Findings:  : 6cm uterus with anterior fibroid (approx 4cm). Normal appearing tubes and ovaries. No suspicious nodes. Bilateral mapping.  Estimated Blood Loss:  less than 50 mL      Total IV Fluids: 700 ml         Specimens: uterus, cervix, bilateral tubes and ovaries, right and left obturator SLN         Complications:  None; patient tolerated the procedure well.         Disposition: PACU - hemodynamically stable.  Procedure Details  The patient was seen in the Holding Room. The risks, benefits, complications, treatment options, and expected outcomes were discussed with the patient.  The patient concurred with the proposed plan, giving informed consent.  The site of surgery properly noted/marked. The patient was identified as Sophia Santos and the procedure verified as a Robotic-assisted hysterectomy with bilateral salpingo oophorectomy with SLN biopsy. A Time Out was held and the above information confirmed. She was receiving a planned blood transfusion with 2 units PRBC due to chronic blood loss anemia.   After induction of anesthesia, the patient was draped and prepped in the usual sterile manner. Pt was placed in supine position after anesthesia and draped and prepped in the usual sterile manner. The abdominal  drape was placed after the CholoraPrep had been allowed to dry for 3 minutes.  Her arms were tucked to her side with all appropriate precautions.  The shoulders were stabilized with padded shoulder blocks applied to the acromium processes.  The patient was placed in the semi-lithotomy position in [place].  The perineum was prepped with Betadine. The patient was then prepped. Foley catheter was placed.  A sterile speculum was placed in the vagina.  The cervix was grasped with a single-tooth tenaculum. '2mg'$  total of ICG was injected into the cervical stroma at 2 and 9 o'clock with 1cc injected at a 1cm and 72m depth (concentration 0.'5mg'$ /ml) in all locations. The cervix was dilated with PKennon Roundsdilators.  The advincula delineator with 3cm koh ring was placed.  OG tube placement was confirmed and to suction.   Next, a 5 mm skin incision was made 1 cm below the subcostal margin in the midclavicular line.  The 5 mm Optiview port and scope was used for direct entry.  Opening pressure was under 10 mm CO2.  The abdomen was insufflated and the findings were noted as above.   At this point and all points during the procedure, the patient's intra-abdominal pressure did not exceed 15 mmHg. Next, a 10 mm skin incision was made in the umbilicus and a right and left port was placed about 10 cm lateral to the robot port on the right and left side.  All ports were placed under direct visualization.  The patient was placed in steep Trendelenburg.  Bowel was folded away into the upper abdomen.  The robot was docked in the normal manner.  The right  and left peritoneum were opened parallel to the IP ligament to open the retroperitoneal spaces bilaterally. The SLN mapping was performed in bilateral pelvic basins. The para rectal and paravesical spaces were opened up entirely with careful dissection below the level of the ureters bilaterally and to the depth of the uterine artery origin in order to skeletonize the uterine "web" and  ensure visualization of all parametrial channels. The para-aortic basins were carefully exposed and evaluated for isolated para-aortic SLN's. Lymphatic channels were identified travelling to the following visualized sentinel lymph node's: right and left obturator SLN. These SLN's were separated from their surrounding lymphatic tissue, removed and sent for permanent pathology.  The hysterectomy was started after the round ligament on the right side was incised and the retroperitoneum was entered and the pararectal space was developed.  The ureter was noted to be on the medial leaf of the broad ligament.  The peritoneum above the ureter was incised and stretched and the infundibulopelvic ligament was skeletonized, cauterized and cut.  The posterior peritoneum was taken down to the level of the KOH ring.  The anterior peritoneum was also taken down.  The bladder flap was created to the level of the KOH ring.  The uterine artery on the right side was skeletonized, cauterized and cut in the normal manner.  A similar procedure was performed on the left.  The colpotomy was made and the uterus, cervix, bilateral ovaries and tubes were amputated and delivered through the vagina.  Pedicles were inspected and excellent hemostasis was achieved.    The colpotomy at the vaginal cuff was closed with Vicryl on a CT1 needle in figure of 8's at the corners and a v-loc running manner.  Irrigation was used and excellent hemostasis was achieved.  At this point in the procedure was completed.  Robotic instruments were removed under direct visulaization.  The robot was undocked. The 10 mm ports were closed with Vicryl on a UR-5 needle and the fascia was closed with 0 Vicryl on a UR-5 needle.  The skin was closed with 4-0 Vicryl in a subcuticular manner.  Dermabond was applied.  Sponge, lap and needle counts correct x 2.  The patient was taken to the recovery room in stable condition.  The vagina was swabbed with  minimal bleeding  noted.   All instrument and needle counts were correct x  3.   The patient was transferred to the recovery room in a stable condition.  Donaciano Eva, MD

## 2021-08-21 NOTE — Anesthesia Postprocedure Evaluation (Signed)
Anesthesia Post Note  Patient: Sophia Santos  Procedure(s) Performed: XI ROBOTIC ASSISTED TOTAL HYSTERECTOMY WITH BILATERAL SALPINGO OOPHORECTOMY (Bilateral: Abdomen) SENTINEL NODE BIOPSY (Abdomen)     Patient location during evaluation: PACU Anesthesia Type: General Level of consciousness: awake and alert, awake and oriented Pain management: pain level controlled Vital Signs Assessment: post-procedure vital signs reviewed and stable Respiratory status: spontaneous breathing, nonlabored ventilation, respiratory function stable and patient connected to nasal cannula oxygen Cardiovascular status: blood pressure returned to baseline and stable Postop Assessment: no apparent nausea or vomiting Anesthetic complications: no   No notable events documented.  Last Vitals:  Vitals:   08/21/21 1300 08/21/21 1315  BP: (!) 160/77 (!) 171/81  Pulse: 68 73  Resp: 15 15  Temp:    SpO2: 95% 90%    Last Pain:  Vitals:   08/21/21 1230  TempSrc:   PainSc: [name]

## 2021-08-21 NOTE — Discharge Instructions (Addendum)
08/21/2021  Do not resume your aggrenox until you are seen in the office for your post-op check. You do not need to restart the megace.  You received a blood transfusion today during surgery.   Activity: 1. Be up and out of the bed during the day.  Take a nap if needed.  You may walk up steps but be careful and use the hand rail.  Stair climbing will tire you more than you think, you may need to stop part way and rest.   2. No lifting or straining for 6 weeks.  3. No driving for 1 week.  Do not drive if you are taking narcotic pain medicine.  4. Shower daily.  No tub baths or submerging your body in water.  5. No sexual activity and nothing in the vagina for 8 weeks.  Medications:  - Take ibuprofen and tylenol first line for pain control. Take these regularly (every 6 hours) to decrease the build up of pain.  - If necessary, for severe pain not relieved by ibuprofen, take tramadol.  - While taking tramadol, you should take sennakot every night to reduce the likelihood of constipation. If this causes diarrhea, stop its use.  Diet: 1. Low sodium Heart Healthy Diet is recommended.  2. It is safe to use a laxative if you have difficulty moving your bowels.   Wound Care: 1. Keep clean and dry.  Shower daily.  Reasons to call the Doctor:  Fever - Oral temperature greater than 100.4 degrees Fahrenheit Foul-smelling vaginal discharge Difficulty urinating Nausea and vomiting Increased pain at the site of the incision that is unrelieved with pain medicine. Difficulty breathing with or without chest pain New calf pain especially if only on one side Sudden, continuing increased vaginal bleeding with or without clots.   Follow-up: 1. Phone visit to discuss pathology in one week. See Everitt Amber in 2-3 weeks.  Contacts: For questions or concerns you should contact:  Dr. Everitt Amber at [phone]  After hours and on week-ends call [phone] and ask to speak to the physician on  call for Gynecologic Oncology   Post Anesthesia Home Care Instructions  Activity: Get plenty of rest for the remainder of the day. A responsible individual must stay with you for 24 hours following the procedure.  For the next 24 hours, DO NOT: -Drive a car -Paediatric nurse -Drink alcoholic beverages -Take any medication unless instructed by your physician -Make any legal decisions or sign important papers.  Meals: Start with liquid foods such as gelatin or soup. Progress to regular foods as tolerated. Avoid greasy, spicy, heavy foods. If nausea and/or vomiting occur, drink only clear liquids until the nausea and/or vomiting subsides. Call your physician if vomiting continues.  Special Instructions/Symptoms: Your throat may feel dry or sore from the anesthesia or the breathing tube placed in your throat during surgery. If this causes discomfort, gargle with warm salt water. The discomfort should disappear within 24 hours.

## 2021-08-21 NOTE — Interval H&P Note (Signed)
History and Physical Interval Note:  08/21/2021 8:43 AM  Sophia Santos  has presented today for surgery, with the diagnosis of ENDOMETRIAL CANCER.  The various methods of treatment have been discussed with the patient and family. After consideration of risks, benefits and other options for treatment, the patient has consented to  Procedure(s): XI ROBOTIC ASSISTED TOTAL HYSTERECTOMY WITH BILATERAL SALPINGO OOPHORECTOMY (Bilateral) SENTINEL NODE BIOPSY POSSIBLE LYMPH NODE DISSECTION (N/A) as a surgical intervention.  The patient's history has been reviewed, patient examined, no change in status, stable for surgery.  I have reviewed the patient's chart and labs. She has severe anemia secondary to chronic blood loss. Anesthesia would like to transfuse 2 units of PRBC. Questions were answered to the patient's satisfaction.     Thereasa Solo

## 2021-08-21 NOTE — Anesthesia Procedure Notes (Signed)
Procedure Name: Intubation Date/Time: 08/21/2021 9:21 AM Performed by: Suan Halter, CRNA Pre-anesthesia Checklist: Patient identified, Emergency Drugs available, Suction available and Patient being monitored Patient Re-evaluated:Patient Re-evaluated prior to induction Oxygen Delivery Method: Circle system utilized Preoxygenation: Pre-oxygenation with 100% oxygen Induction Type: IV induction Ventilation: Mask ventilation without difficulty Laryngoscope Size: Mac, 3 and Glidescope Grade View: Grade I Tube type: Oral Tube size: 7.0 mm Number of attempts: 1 Airway Equipment and Method: Stylet and Oral airway Placement Confirmation: ETT inserted through vocal cords under direct vision, positive ETCO2 and breath sounds checked- equal and bilateral Secured at: 22 cm Tube secured with: Tape Dental Injury: Teeth and Oropharynx as per pre-operative assessment

## 2021-08-21 NOTE — Transfer of Care (Signed)
Immediate Anesthesia Transfer of Care Note  Patient: Sophia Santos  Procedure(s) Performed: Procedure(s) (LRB): XI ROBOTIC ASSISTED TOTAL HYSTERECTOMY WITH BILATERAL SALPINGO OOPHORECTOMY (Bilateral) SENTINEL NODE BIOPSY (N/A)  Patient Location: PACU  Anesthesia Type: General  Level of Consciousness: awake, oriented, sedated and patient cooperative  Airway & Oxygen Therapy: Patient Spontanous Breathing and Patient connected to face mask oxygen  Post-op Assessment: Report given to PACU RN and Post -op Vital signs reviewed and stable  Post vital signs: Reviewed and stable  Complications: No apparent anesthesia complications Last Vitals:  Vitals Value Taken Time  BP 204/99 08/21/21 1115  Temp    Pulse 70 08/21/21 1119  Resp 12 08/21/21 1119  SpO2 99 % 08/21/21 1119  Vitals shown include unvalidated device data.  Last Pain:  Vitals:   08/21/21 0729  TempSrc: Oral  PainSc: 5       Patients Stated Pain Goal: 5 ([id number])  Complications: No notable events documented.

## 2021-08-21 NOTE — Telephone Encounter (Signed)
Spoke with daughter in-law Sophia Santos. She verified Sophia Santos's son Sophia Santos will staying with her after surgery.

## 2021-08-22 ENCOUNTER — Telehealth: Payer: Self-pay

## 2021-08-22 ENCOUNTER — Encounter (HOSPITAL_BASED_OUTPATIENT_CLINIC_OR_DEPARTMENT_OTHER): Payer: Self-pay | Admitting: Gynecologic Oncology

## 2021-08-22 LAB — TYPE AND SCREEN
ABO/RH(D): O POS
Antibody Screen: NEGATIVE
Unit division: 0
Unit division: 0

## 2021-08-22 LAB — BPAM RBC
Blood Product Expiration Date: 202210092359
Blood Product Expiration Date: 202210092359
ISSUE DATE / TIME: 202209070906
ISSUE DATE / TIME: 202209070906
Unit Type and Rh: 5100
Unit Type and Rh: 5100

## 2021-08-22 NOTE — Telephone Encounter (Signed)
Left message for Sophia Santos with the assistance of [place] interpreter Vilinda Flake [phone]. Instructed to call Gyn oncology office  at [phone], checking in to see how she is doing after her procedure yesterday.

## 2021-08-23 NOTE — Telephone Encounter (Signed)
Spoke with Ms. Toste via [facility] interpreter service. She states she is eating, drinking and urinating well. She has not had a BM yet but is passing gas. She is taking senokot as prescribed and encouraged her to drink plenty of water. She took a capful of Miralax today. Instructed her to take senokot-s 2 tabs bid.  If no BM tomorrow morning, she can increase the Miralax to 1 capful bid.She denies fever or chills. Incisions are dry and intact. The umbilical incision drained a small amount of blood earlier today. Told her that she can apply a band aide with some pressure if the bleeding recurs.  Her pain is controlled with Tramadol and Tylenol.  Instructed to call office with any fever, chills, purulent drainage, uncontrolled pain or any other questions or concerns. Patient verbalizes understanding.  Pt aware of post op appointments as well as the office number [phone] and after hours number [phone] to call if she has any questions or concerns

## 2021-08-27 NOTE — Progress Notes (Signed)
Gynecologic Oncology Telehealth Follow-up Note  I connected with Sophia Santos on 08/28/21 at  4:00 PM EDT by telephone and verified that I am speaking with the correct person using two identifiers.  I discussed the limitations, risks, security and privacy concerns of performing an evaluation and management service by telemedicine and the availability of in-person appointments. I also discussed with the patient that there may be a patient responsible charge related to this service. The patient expressed understanding and agreed to proceed.  Other persons participating in the visit and their role in the encounter: daughter for translation and relaying information.  Patient's location: home Provider's location: [place]  Chief Complaint:  Chief Complaint  Patient presents with   endometrial cancer   Assessment/Plan:  [name]  is a 87 y.o.  year old P37 russian speaking [place] woman with stage IIIA grade 1 endometrial cancer.  I am recommending chemotherapy for this advanced disease with consideration for vaginal brachytherapy to control risk of local recurrence.  HPI: Ms Sophia Santos is a 87 year old P1 who was seen in consultation at the request of Dr Kennon Rounds for evaluation and treatment of grade 1 endometrial cancer.   Her symptoms began in early August, 2022 with postmenopausal bleeding (heavy).  She saw her PCP, Tye Savoy, NP, for a problem visit due to this symptom on 07/18/21. She was then referred to Dr Kennon Rounds from Gynecology who saw the patient on 07/31/21. Work-up of symptoms included a transvaginal US and endometrial biopsy. Transvaginal US on 07/19/2021 showed a uterus measuring 7.6 x 4.1 x 4.3 cm.  There were fibroids seen within the uterus.  The endometrium measured 5.6 mm.  The ovaries bilaterally were grossly normal. Endometrial sampling with an endometrial Pipelle was performed on 07/31/2021 and showed FIGO grade 1 endometrioid endometrial adenocarcinoma.    Interval Hx:  On 08/21/21 she underwent robotic assisted total hysterectomy with BSO and SLN biopsy. Intraoperative findings were significant for a normal appearing 6cm uterus with anterior fibroid. There were normal appearing tubes and ovaries and no suspicious nodes. Surgery was uncomplicated, Mapping was bilateral.  Final pathology revealed a FIGO grade 2 endometrioid endometrial adenocarcinoma with 100% myometrial invasion and serosal involvement. Lymph nodes were negative for metastases and there was no LVSI identified. MMR testing was pending.  Since surgery she has had some diarrhea and loose stools. .   Current Meds:  Outpatient Encounter Medications as of 08/28/2021  Medication Sig   acetaminophen (TYLENOL) 325 MG tablet Take 352 mg by mouth every 6 (six) hours as needed for mild pain or moderate pain.   ALPRAZolam (XANAX) 1 MG tablet Take 0.5-1 tablets (0.5-1 mg total) by mouth at bedtime as needed for anxiety or sleep.   captopril (CAPOTEN) 50 MG tablet TAKE 1 TABLET BY MOUTH TWICE A DAY   Cholecalciferol 1000 UNITS tablet Take 1,000 Units by mouth daily. Vitamin d   Coenzyme Q10 (CO Q-10) 100 MG CAPS Take by mouth daily.   dicyclomine (BENTYL) 10 MG capsule TAKE 1 CAPSULE BY MOUTH THREE TIMES A DAY AS NEEDED FOR SPASMS   ezetimibe (ZETIA) 10 MG tablet TAKE 1 TABLET BY MOUTH EVERY DAY   MYRBETRIQ 25 MG TB24 tablet Take 1 tablet (25 mg total) by mouth daily. (Patient taking differently: Take 25 mg by mouth at bedtime.)   Omega 3 1000 MG CAPS Take 1,000 mg by mouth daily.   omeprazole (PRILOSEC) 20 MG capsule Take 2 capsules (40 mg total) by mouth  daily. Annual appt due in March must see provider for future refills (Patient taking differently: Take 20 mg by mouth daily. Prn at hs also)   Pancrelipase, Lip-Prot-Amyl, (CREON) 24000-76000 units CPEP TAKE ONE CAPSULE BY MOUTH 3 TIMES A DAY WITH FOOD (Patient taking differently: as needed. TAKE ONE CAPSULE BY MOUTH 3 TIMES A DAY WITH FOOD)    polyethylene glycol powder (GAVILAX) 17 GM/SCOOP powder MIX 1 CAPFUL (17 GRAMS) AS DIRECTED AND DRINK TWICE A DAY AS NEEDED FOR MODERATE CONSTIPATION   propranolol ER (INDERAL LA) 120 MG 24 hr capsule Take 1 capsule (120 mg total) by mouth daily.   RESTASIS 0.05 % ophthalmic emulsion Place 1 drop into both eyes 2 (two) times daily.    senna-docusate (SENOKOT-S) 8.6-50 MG tablet Take 2 tablets by mouth at bedtime. For AFTER surgery, do not take if having diarrhea   traMADol (ULTRAM) 50 MG tablet Take 1 tablet (50 mg total) by mouth every 6 (six) hours as needed for severe pain. For AFTER surgery only, do not take and drive   Vitamin A 2952 MCG (8000 UT) CAPS Take 2,400 mcg by mouth daily.   No facility-administered encounter medications on file as of 08/28/2021.    Allergy:  Allergies  Allergen Reactions   Fosamax [Alendronate Sodium]     pains   Lipitor [Atorvastatin]     arthralgias   Voltaren [Diclofenac Sodium]     Skin rash from gel   Zocor [Simvastatin]     REACTION: cramp    Social Hx:   Social History   Socioeconomic History   Marital status: Widowed    Spouse name: Not on file   Number of children: 1   Years of education: Not on file   Highest education level: Not on file  Occupational History   Occupation: retired  Tobacco Use   Smoking status: Never   Smokeless tobacco: Never  Vaping Use   Vaping Use: Never used  Substance and Sexual Activity   Alcohol use: No   Drug use: No   Sexual activity: Not Currently    Birth control/protection: None  Other Topics Concern   Not on file  Social History Narrative   One Boy    Daily Caffeine - tea         Social Determinants of Health   Financial Resource Strain: Not on file  Food Insecurity: Not on file  Transportation Needs: Not on file  Physical Activity: Not on file  Stress: Not on file  Social Connections: Not on file  Intimate Partner Violence: Not on file    Past Surgical Hx:  Past Surgical  History:  Procedure Laterality Date   bilateral cataracts     COLONOSCOPY     INGUINAL HERNIA REPAIR Bilateral 09/13/2020   Procedure: LAPAROSCOPIC BILATERAL INGUINAL HERNIA REPAIR WITH MESH;  Surgeon: Ralene Ok, MD;  Location: [place] [facility];  Service: General;  Laterality: Bilateral;   ROBOTIC ASSISTED TOTAL HYSTERECTOMY WITH BILATERAL SALPINGO OOPHERECTOMY Bilateral 08/21/2021   Procedure: XI ROBOTIC ASSISTED TOTAL HYSTERECTOMY WITH BILATERAL SALPINGO OOPHORECTOMY;  Surgeon: Everitt Amber, MD;  Location: [place];  Service: Gynecology;  Laterality: Bilateral;   SENTINEL NODE BIOPSY N/A 08/21/2021   Procedure: SENTINEL NODE BIOPSY;  Surgeon: Everitt Amber, MD;  Location: [facility];  Service: Gynecology;  Laterality: N/A;   uterine prolapse surgery      Past Medical Hx:  Past Medical History:  Diagnosis Date   Arthritis    Complication of anesthesia    "  years ago, hard to awaken, no with recent procedures."   Diverticulosis    Endometrial cancer ([place])    Gallstones    GERD (gastroesophageal reflux disease)    Headache 08/15/2021   none recent   HTN (hypertension)    Hyperlipidemia    Irritable bowel syndrome (IBS)    Osteoarthritis    Osteopenia    Osteoporosis    PMB (postmenopausal bleeding) 08/15/2021   Skin cancer    skin cancer -    Urinary frequency 08/15/2021   Wears glasses 08/15/2021    Past Gynecological History:  see HPI No LMP recorded. Patient is postmenopausal.  Family Hx:  Family History  Problem Relation Age of Onset   Colon cancer Mother 21       colorectal   Stroke Mother    Parkinson's disease Father    Dementia Father    Colon cancer Sister        colorectal   Colon polyps Sister    Coronary artery disease Other        female 1st degree <50   Esophageal cancer Neg Hx    Stomach cancer Neg Hx    Anesthesia problems Neg Hx    Breast cancer Neg Hx    Endometrial cancer Neg Hx    Pancreatic cancer Neg Hx    Prostate  cancer Neg Hx     Review of Systems:  Constitutional  Feels well,    ENT Normal appearing ears and nares bilaterally Skin/Breast  No rash, sores, jaundice, itching, dryness Cardiovascular  No chest pain, shortness of breath, or edema  Pulmonary  No cough or wheeze.  Gastro Intestinal  No nausea, vomitting, or diarrhoea. No bright red blood per rectum, no abdominal pain, change in bowel movement, or constipation.  Genito Urinary  No frequency, urgency, dysuria, No bleeding Musculo Skeletal  No myalgia, arthralgia, joint swelling or pain  Neurologic  No weakness, numbness, change in gait,  Psychology  No depression, anxiety, insomnia.   Vitals:  There were no vitals taken for this visit.  Physical Exam: Deferred.  I discussed the assessment and treatment plan with the patient. The patient was provided with an opportunity to ask questions and all were answered. The patient agreed with the plan and demonstrated an understanding of the instructions.   The patient was advised to call back or see an in-person evaluation if the symptoms worsen or if the condition fails to improve as anticipated.   I provided 20 minutes of non face-to-face telephone visit time during this encounter, and > 50% was spent counseling as documented under my assessment & plan.    Thereasa Solo, MD  08/28/2021, 3:55 PM

## 2021-08-28 ENCOUNTER — Inpatient Hospital Stay: Payer: Medicare Other | Attending: Gynecologic Oncology | Admitting: Gynecologic Oncology

## 2021-08-28 ENCOUNTER — Encounter: Payer: Self-pay | Admitting: Gynecologic Oncology

## 2021-08-28 DIAGNOSIS — C541 Malignant neoplasm of endometrium: Secondary | ICD-10-CM | POA: Insufficient documentation

## 2021-08-28 DIAGNOSIS — Z90722 Acquired absence of ovaries, bilateral: Secondary | ICD-10-CM

## 2021-08-28 DIAGNOSIS — Z7189 Other specified counseling: Secondary | ICD-10-CM

## 2021-08-28 DIAGNOSIS — Z90710 Acquired absence of both cervix and uterus: Secondary | ICD-10-CM

## 2021-08-29 ENCOUNTER — Encounter: Payer: Self-pay | Admitting: Oncology

## 2021-08-29 DIAGNOSIS — C541 Malignant neoplasm of endometrium: Secondary | ICD-10-CM

## 2021-08-29 NOTE — Progress Notes (Signed)
Called Lonell Face (family member) to schedule an apt with Dr. Alvy Bimler on 9/26 or 9/27.  [name] said they will be out of town that week and would like an apt the week before or after.  Scheduled apt on 09/16/21 at 2:00 (1:30 arrival).  Went over that the apt is at the [facility] and she will attend with Annais.

## 2021-08-30 LAB — SURGICAL PATHOLOGY

## 2021-08-31 IMAGING — US US ABDOMEN COMPLETE
1 series · 13 of 25 positions shown · non-contrast
Comparison: CT 09/30/2016

CLINICAL DATA: Chronic abdominal pain, elevated alkaline
phosphatase

EXAM:
ABDOMEN ULTRASOUND COMPLETE

[Series 1: us abdomen complete · 0.25mm/px · 13 of 76 slices shown]
[im 1/76]
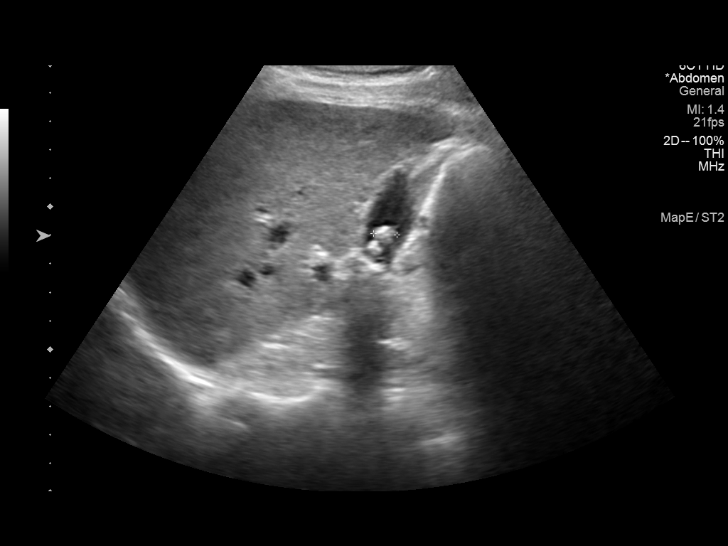
[im 7/76]
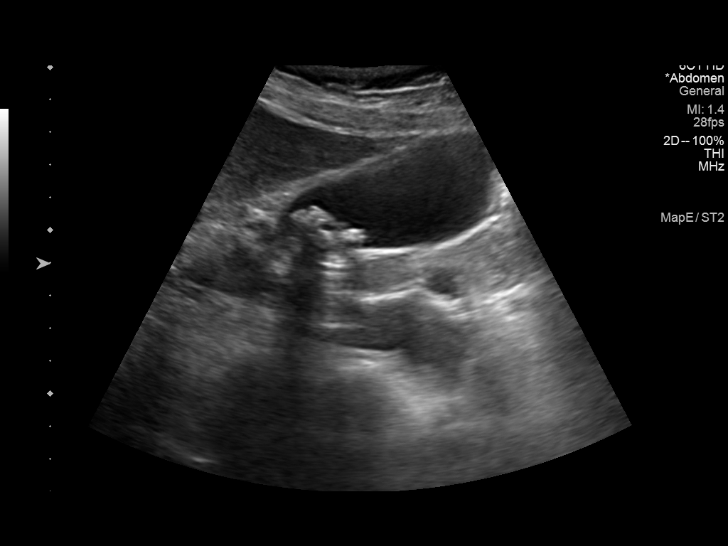
[im 13/76]
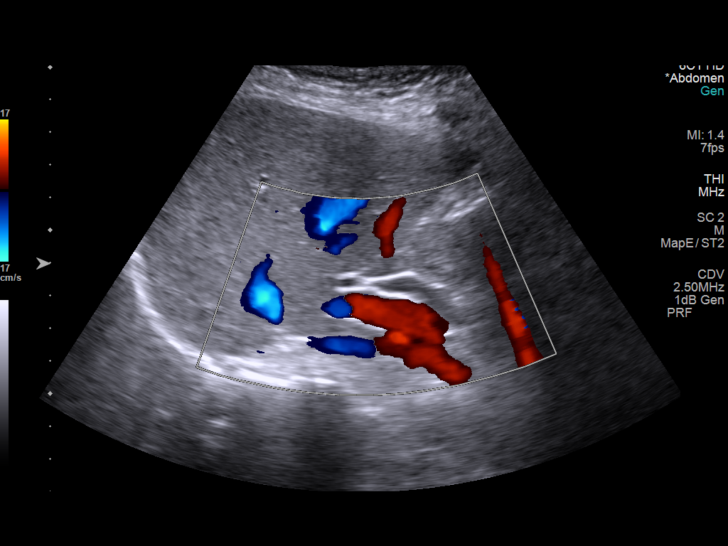
[im 19/76]
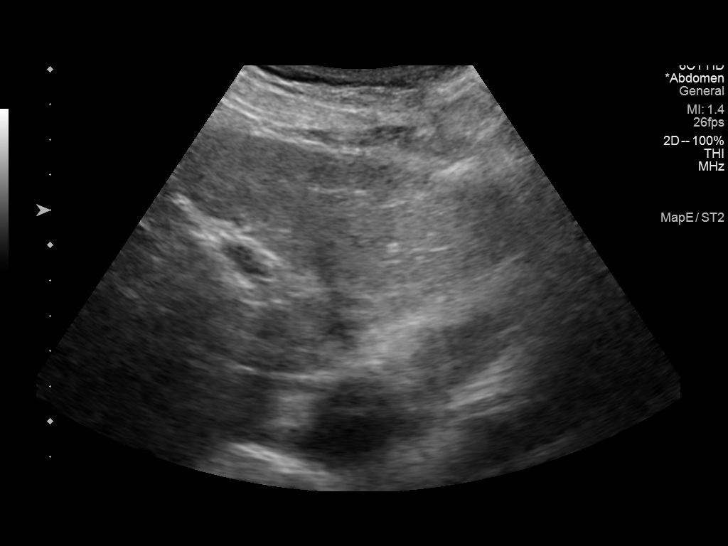
[im 26/76]
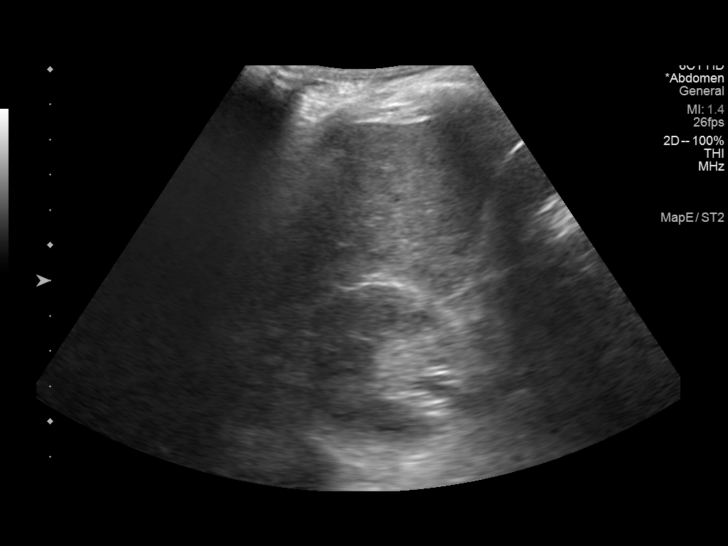
[im 32/76]
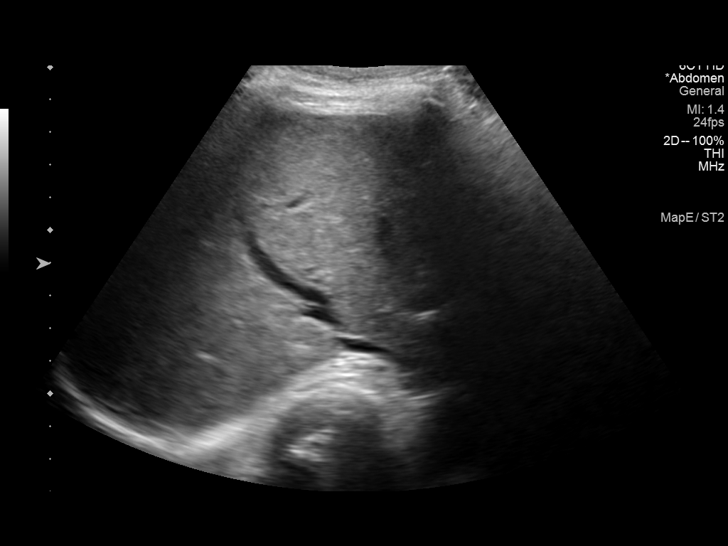
[im 38/76]
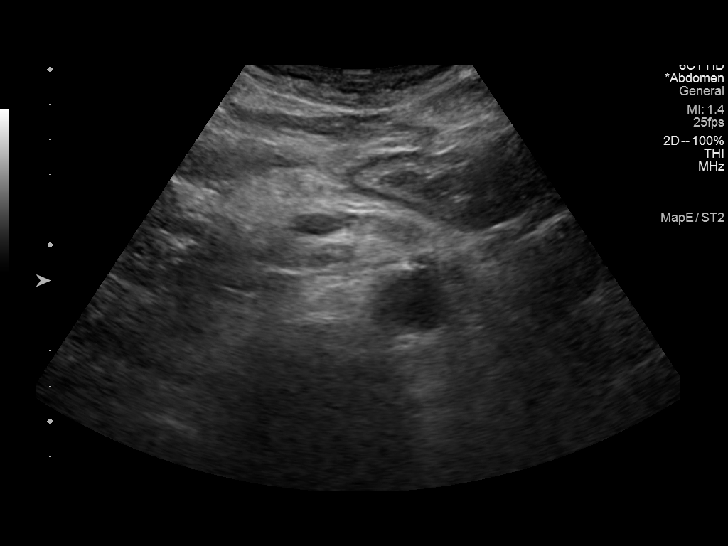
[im 44/76]
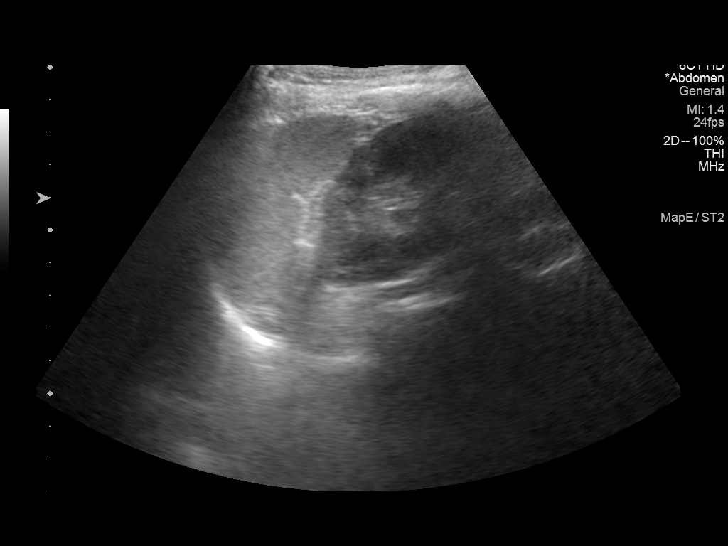
[im 51/76]
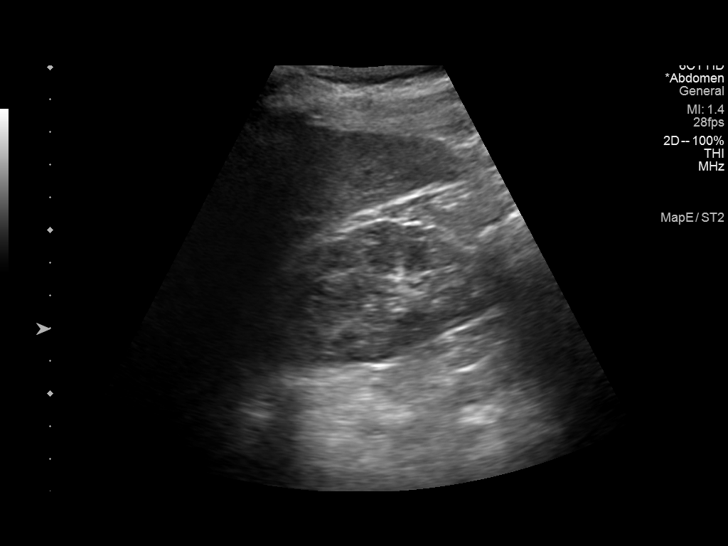
[im 57/76]
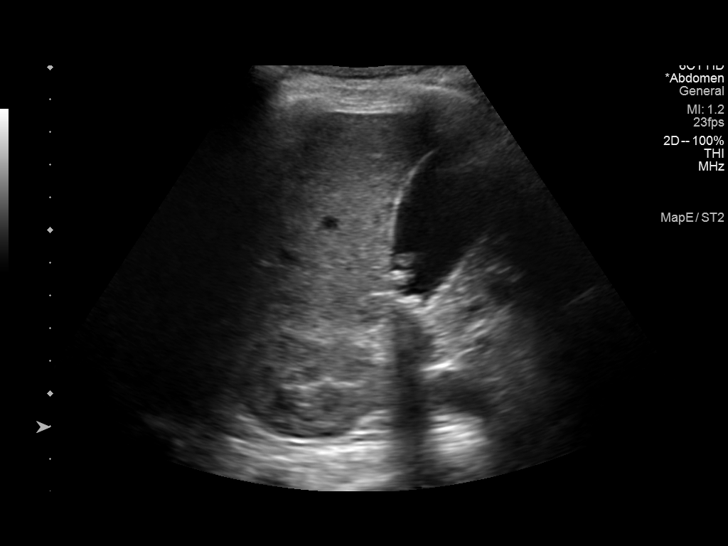
[im 63/76]
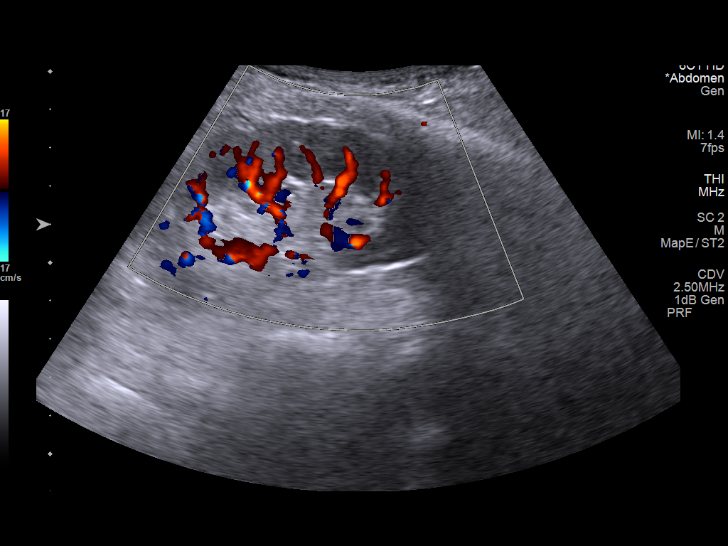
[im 69/76]
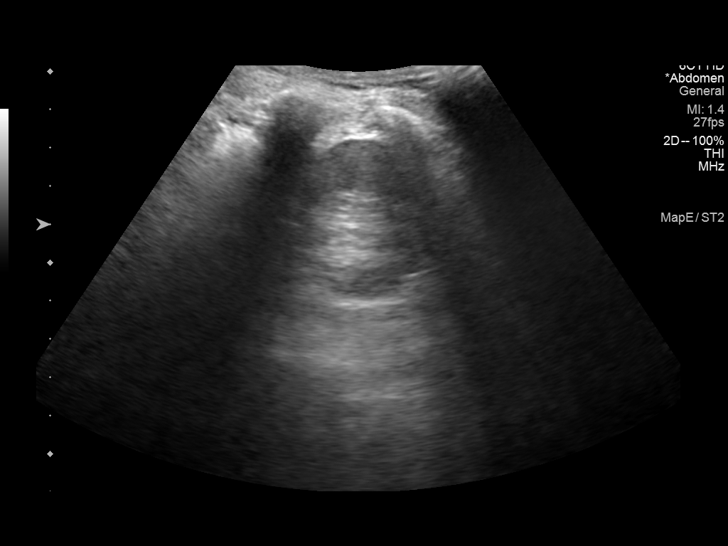
[im 76/76]
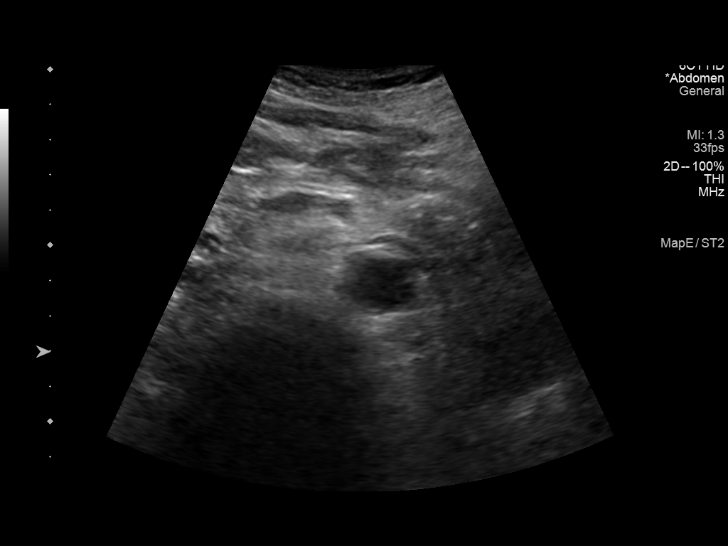

[13 of 25 positions shown; findings below may reference images not displayed]

FINDINGS: Gallbladder: Multiple mobile hyperdense, shadowing stones within the
gallbladder lumen, largest measuring up to 8 mm. No wall thickening
visualized. No sonographic Murphy sign noted by sonographer.

Common bile duct: Diameter: 4 mm

Liver: No focal lesion identified. Coarsened echotexture with mildly
increased parenchymal echogenicity. Portal vein is patent on color
Doppler imaging with normal direction of blood flow towards the
liver.

IVC: No abnormality visualized.

Pancreas: Obscured by bowel gas.

Spleen: Size and appearance within normal limits.

Right Kidney: Length: 10 cm. Echogenicity within normal limits. No
mass or hydronephrosis visualized.

Left Kidney: Length: 8.7 cm. Echogenicity within normal limits. No
mass or hydronephrosis visualized.

Abdominal aorta: Atheromatous. No aneurysm visualized. Distal
portion obscured by bowel gas.

Other findings: None.
IMPRESSION: 1. Cholelithiasis without sonographic evidence of acute
cholecystitis.
2. Echogenic, slightly heterogeneous appearance of the liver,
nonspecific but can be seen in the setting of hepatic steatosis as
well as hepatitis and cirrhosis. Correlate with LFTs.

## 2021-09-03 NOTE — Progress Notes (Signed)
Gynecologic Oncology Follow-up Note  Chief Complaint:  Chief Complaint  Patient presents with   Endometrial adenocarcinoma [facility])    Assessment/Plan:  [name]  is a 87 y.o.  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   I am recommending chemotherapy for this advanced disease with consideration for vaginal brachytherapy to control risk of local recurrence.  I am recommending pretreatment CT imaging for staging purposes.   HPI: Ms Sophia Santos is a 87 year old P1 who was seen in consultation at the request of Dr Kennon Rounds for evaluation and treatment of grade 1 endometrial cancer.   Her symptoms began in early August, 2022 with postmenopausal bleeding (heavy).  She saw her PCP, Tye Savoy, NP, for a problem visit due to this symptom on 07/18/21. She was then referred to Dr Kennon Rounds from Gynecology who saw the patient on 07/31/21. Work-up of symptoms included a transvaginal US and endometrial biopsy. Transvaginal US on 07/19/2021 showed a uterus measuring 7.6 x 4.1 x 4.3 cm.  There were fibroids seen within the uterus.  The endometrium measured 5.6 mm.  The ovaries bilaterally were grossly normal. Endometrial sampling with an endometrial Pipelle was performed on 07/31/2021 and showed FIGO grade 1 endometrioid endometrial adenocarcinoma.   Interval Hx:  On 08/21/21 she underwent robotic assisted total hysterectomy with BSO and SLN biopsy. Intraoperative findings were significant for a normal appearing 6cm uterus with anterior fibroid. There were normal appearing tubes and ovaries and no suspicious nodes. Surgery was uncomplicated, Mapping was bilateral.  Final pathology revealed a FIGO grade 2 endometrioid endometrial adenocarcinoma with 100% myometrial invasion and serosal involvement. Lymph nodes were negative for metastases and there was no LVSI identified. MMR deficient /MSI high; [place]  hypermethylation present.  Since surgery she has had some diarrhea and loose stools and peripheral edema   Current Meds:  Outpatient Encounter Medications as of 09/04/2021  Medication Sig   acetaminophen (TYLENOL) 325 MG tablet Take 352 mg by mouth every 6 (six) hours as needed for mild pain or moderate pain.   ALPRAZolam (XANAX) 1 MG tablet Take 0.5-1 tablets (0.5-1 mg total) by mouth at bedtime as needed for anxiety or sleep.   captopril (CAPOTEN) 50 MG tablet TAKE 1 TABLET BY MOUTH TWICE A DAY   Cholecalciferol 1000 UNITS tablet Take 1,000 Units by mouth daily. Vitamin d   Coenzyme Q10 (CO Q-10) 100 MG CAPS Take by mouth daily.   dicyclomine (BENTYL) 10 MG capsule TAKE 1 CAPSULE BY MOUTH THREE TIMES A DAY AS NEEDED FOR SPASMS   ezetimibe (ZETIA) 10 MG tablet TAKE 1 TABLET BY MOUTH EVERY DAY   MYRBETRIQ 25 MG TB24 tablet Take 1 tablet (25 mg total) by mouth daily. (Patient taking differently: Take 25 mg by mouth at bedtime.)   omeprazole (PRILOSEC) 20 MG capsule Take 2 capsules (40 mg total) by mouth daily. Annual appt due in March must see provider for future refills (Patient taking differently: Take 20 mg by mouth daily. Prn at hs also)   Pancrelipase, Lip-Prot-Amyl, (CREON) 24000-76000 units CPEP TAKE ONE CAPSULE BY MOUTH 3 TIMES A DAY WITH FOOD (Patient taking differently: as needed. TAKE ONE CAPSULE BY MOUTH 3 TIMES A DAY WITH FOOD)   polyethylene glycol powder (GAVILAX) 17 GM/SCOOP powder MIX 1 CAPFUL (17 GRAMS) AS DIRECTED AND DRINK TWICE A DAY AS NEEDED FOR MODERATE CONSTIPATION   propranolol ER (INDERAL LA) 120 MG 24 hr capsule Take  1 capsule (120 mg total) by mouth daily.   RESTASIS 0.05 % ophthalmic emulsion Place 1 drop into both eyes 2 (two) times daily.    senna-docusate (SENOKOT-S) 8.6-50 MG tablet Take 2 tablets by mouth at bedtime. For AFTER surgery, do not take if having diarrhea   Vitamin A 2400 MCG (8000 UT) CAPS Take 2,400 mcg by mouth daily.   Omega 3 1000 MG CAPS Take  1,000 mg by mouth daily. (Patient not taking: Reported on 09/04/2021)   traMADol (ULTRAM) 50 MG tablet Take 1 tablet (50 mg total) by mouth every 6 (six) hours as needed for severe pain. For AFTER surgery only, do not take and drive (Patient not taking: Reported on 09/04/2021)   No facility-administered encounter medications on file as of 09/04/2021.    Allergy:  Allergies  Allergen Reactions   Fosamax [Alendronate Sodium]     pains   Lipitor [Atorvastatin]     arthralgias   Voltaren [Diclofenac Sodium]     Skin rash from gel   Zocor [Simvastatin]     REACTION: cramp    Social Hx:   Social History   Socioeconomic History   Marital status: Widowed    Spouse name: Not on file   Number of children: 1   Years of education: Not on file   Highest education level: Not on file  Occupational History   Occupation: retired  Tobacco Use   Smoking status: Never   Smokeless tobacco: Never  Vaping Use   Vaping Use: Never used  Substance and Sexual Activity   Alcohol use: No   Drug use: No   Sexual activity: Not Currently    Birth control/protection: None  Other Topics Concern   Not on file  Social History Narrative   One Boy    Daily Caffeine - tea         Social Determinants of Health   Financial Resource Strain: Not on file  Food Insecurity: Not on file  Transportation Needs: Not on file  Physical Activity: Not on file  Stress: Not on file  Social Connections: Not on file  Intimate Partner Violence: Not on file    Past Surgical Hx:  Past Surgical History:  Procedure Laterality Date   bilateral cataracts     COLONOSCOPY     INGUINAL HERNIA REPAIR Bilateral 09/13/2020   Procedure: LAPAROSCOPIC BILATERAL INGUINAL HERNIA REPAIR WITH MESH;  Surgeon: Ralene Ok, MD;  Location: [place];  Service: General;  Laterality: Bilateral;   ROBOTIC ASSISTED TOTAL HYSTERECTOMY WITH BILATERAL SALPINGO OOPHERECTOMY Bilateral 08/21/2021   Procedure: XI ROBOTIC ASSISTED TOTAL  HYSTERECTOMY WITH BILATERAL SALPINGO OOPHORECTOMY;  Surgeon: Everitt Amber, MD;  Location: [place];  Service: Gynecology;  Laterality: Bilateral;   SENTINEL NODE BIOPSY N/A 08/21/2021   Procedure: SENTINEL NODE BIOPSY;  Surgeon: Everitt Amber, MD;  [facility];  Service: Gynecology;  Laterality: N/A;   uterine prolapse surgery      Past Medical Hx:  Past Medical History:  Diagnosis Date   Arthritis    Complication of anesthesia    "years ago, hard to awaken, no with recent procedures."   Diverticulosis    Endometrial cancer ([place])    Gallstones    GERD (gastroesophageal reflux disease)    Headache 08/15/2021   none recent   HTN (hypertension)    Hyperlipidemia    Irritable bowel syndrome (IBS)    Osteoarthritis    Osteopenia    Osteoporosis    PMB (postmenopausal  bleeding) 08/15/2021   Skin cancer    skin cancer -    Urinary frequency 08/15/2021   Wears glasses 08/15/2021    Past Gynecological History:  see HPI No LMP recorded. Patient is postmenopausal.  Family Hx:  Family History  Problem Relation Age of Onset   Colon cancer Mother 37       colorectal   Stroke Mother    Parkinson's disease Father    Dementia Father    Colon cancer Sister        colorectal   Colon polyps Sister    Coronary artery disease Other        female 1st degree <50   Esophageal cancer Neg Hx    Stomach cancer Neg Hx    Anesthesia problems Neg Hx    Breast cancer Neg Hx    Endometrial cancer Neg Hx    Pancreatic cancer Neg Hx    Prostate cancer Neg Hx     Review of Systems:  Constitutional  Feels well,    ENT Normal appearing ears and nares bilaterally Skin/Breast  No rash, sores, jaundice, itching, dryness Cardiovascular  No chest pain, shortness of breath, or edema  Pulmonary  No cough or wheeze.  Gastro Intestinal  No nausea, vomitting, or diarrhoea. No bright red blood per rectum, no abdominal pain, change in bowel movement, or  constipation.  Genito Urinary  No frequency, urgency, dysuria, No bleeding Musculo Skeletal  No myalgia, arthralgia, joint swelling or pain  Neurologic  No weakness, numbness, change in gait,  Psychology  No depression, anxiety, insomnia.   Vitals:  Blood pressure 135/61, pulse 68, temperature (!) 97.5 F (36.4 C), temperature source Oral, resp. rate 16, height 5' (1.524 m), weight 135 lb 3.2 oz (61.3 kg), SpO2 100 %.  Physical Exam: WD in NAD Neck  Supple NROM, without any enlargements.  Lymph Node Survey No cervical supraclavicular or inguinal adenopathy Cardiovascular  Well perfused peripheries Pulm No increased WOB Skin  No rash/lesions/breakdown  Psychiatry  Alert and oriented to person, place, and time  Abdomen  Normoactive bowel sounds, abdomen soft, non-tender and obese without evidence of hernia. Well healed incisions. Back No CVA tenderness Genito Urinary  Vulva/vagina: smooth, in tact, no bleeding, no lesions. Rectal  deferred Extremities  No bilateral cyanosis, clubbing or edema.    Thereasa Solo, MD  09/04/2021, 5:21 PM

## 2021-09-04 ENCOUNTER — Ambulatory Visit: Payer: Medicare Other | Admitting: Nurse Practitioner

## 2021-09-04 ENCOUNTER — Other Ambulatory Visit: Payer: Self-pay

## 2021-09-04 ENCOUNTER — Encounter: Payer: Self-pay | Admitting: Gynecologic Oncology

## 2021-09-04 ENCOUNTER — Inpatient Hospital Stay (HOSPITAL_BASED_OUTPATIENT_CLINIC_OR_DEPARTMENT_OTHER): Payer: Medicare Other | Admitting: Gynecologic Oncology

## 2021-09-04 VITALS — BP 135/61 | HR 68 | Temp 97.5°F | Resp 16 | Ht 60.0 in | Wt 135.2 lb

## 2021-09-04 DIAGNOSIS — Z90710 Acquired absence of both cervix and uterus: Secondary | ICD-10-CM

## 2021-09-04 DIAGNOSIS — Z90722 Acquired absence of ovaries, bilateral: Secondary | ICD-10-CM

## 2021-09-04 DIAGNOSIS — Z7189 Other specified counseling: Secondary | ICD-10-CM

## 2021-09-04 DIAGNOSIS — C541 Malignant neoplasm of endometrium: Secondary | ICD-10-CM

## 2021-09-04 NOTE — Patient Instructions (Signed)
Dr Denman George is recommending a CT scan followed by chemotherapy and radiation.  She will recommend follow-up with pelvic exams at 3 monthly intervals after finishing treatment.  She recommends seeing your primary doctor about your swelling as this may be secondary to your circulation.

## 2021-09-09 ENCOUNTER — Other Ambulatory Visit: Payer: Self-pay | Admitting: Oncology

## 2021-09-09 ENCOUNTER — Encounter: Payer: Self-pay | Admitting: Oncology

## 2021-09-09 NOTE — Progress Notes (Signed)
Gynecologic Oncology Multi-Disciplinary Disposition Conference Note  Date of the Conference: 09/09/2021  Patient Name: Sophia Santos  Referring Provider: Dr. Kennon Rounds Primary GYN Oncologist:   Stage/Disposition:  Stage IIA, grade 2 endometrioid adenocarcinoma. Disposition is to 6 cycles of chemotherapy with consideration for vaginal brachytherapy.  Also referral for genetic counseling.   This Multidisciplinary conference took place involving physicians from [place], [place], Radiation Oncology, Pathology, Radiology along with the Gynecologic Oncology Nurse Practitioner and RN.  Comprehensive assessment of the patient's malignancy, staging, need for surgery, chemotherapy, radiation therapy, and need for further testing were reviewed. Supportive measures, both inpatient and following discharge were also discussed. The recommended plan of care is documented. Greater than 35 minutes were spent correlating and coordinating this patient's care.

## 2021-09-10 ENCOUNTER — Ambulatory Visit (HOSPITAL_COMMUNITY): Payer: Medicare Other

## 2021-09-12 ENCOUNTER — Telehealth: Payer: Self-pay | Admitting: Oncology

## 2021-09-12 NOTE — Telephone Encounter (Signed)
Called [name] (DIL) regarding genetic counseling referral.  She said her husband, Roopa's son, has just passed away so she does not want to add any appointments at this time.

## 2021-09-14 ENCOUNTER — Encounter: Payer: Self-pay | Admitting: Radiology

## 2021-09-16 ENCOUNTER — Ambulatory Visit: Payer: Medicare Other | Admitting: Hematology and Oncology

## 2021-09-16 ENCOUNTER — Other Ambulatory Visit: Payer: Medicare Other

## 2021-09-17 ENCOUNTER — Ambulatory Visit (HOSPITAL_COMMUNITY): Payer: Medicare Other

## 2021-09-18 ENCOUNTER — Telehealth: Payer: Self-pay | Admitting: Internal Medicine

## 2021-09-18 ENCOUNTER — Ambulatory Visit: Payer: Medicare Other | Admitting: Radiation Oncology

## 2021-09-18 ENCOUNTER — Ambulatory Visit: Payer: Medicare Other

## 2021-09-18 NOTE — Telephone Encounter (Signed)
   Daughter requesting FL2 be completed for patient. Please print blank form and complete

## 2021-09-19 NOTE — Telephone Encounter (Signed)
Called daughter she states this is in correct. This is her husband mother and she does not need FL2. She called Tuesday for her mom " Sophia Santos". Inform daughter we received that msg just waiting on md to sign, and I will email it back to the two places she requested. Closing this encounter.Marland KitchenJohny Chess

## 2021-09-24 ENCOUNTER — Other Ambulatory Visit: Payer: Self-pay

## 2021-09-24 ENCOUNTER — Ambulatory Visit (INDEPENDENT_AMBULATORY_CARE_PROVIDER_SITE_OTHER): Payer: Medicare Other | Admitting: Internal Medicine

## 2021-09-24 ENCOUNTER — Encounter: Payer: Self-pay | Admitting: Internal Medicine

## 2021-09-24 ENCOUNTER — Ambulatory Visit: Payer: Medicare Other | Admitting: Internal Medicine

## 2021-09-24 VITALS — BP 148/80 | HR 78 | Ht 60.0 in | Wt 131.6 lb

## 2021-09-24 DIAGNOSIS — F411 Generalized anxiety disorder: Secondary | ICD-10-CM | POA: Diagnosis not present

## 2021-09-24 DIAGNOSIS — I10 Essential (primary) hypertension: Secondary | ICD-10-CM

## 2021-09-24 DIAGNOSIS — R300 Dysuria: Secondary | ICD-10-CM | POA: Diagnosis not present

## 2021-09-24 MED ORDER — CEPHALEXIN 500 MG PO CAPS: 500.0000 mg | ORAL_CAPSULE | Freq: Three times a day (TID) | ORAL | 0 refills | Status: AC

## 2021-09-24 MED ORDER — AMLODIPINE BESYLATE 5 MG PO TABS: 5.0000 mg | ORAL_TABLET | Freq: Every day | ORAL | 3 refills | Status: DC

## 2021-09-24 NOTE — Patient Instructions (Signed)
Please take all new medication as prescribed - the antibiotic, and the blood pressure medication  Please continue all other medications as before, and refills have been done if requested.  Please have the pharmacy call with any other refills you may need.  Please keep your appointments with your specialists as you may have planned  Please go to the LAB at the blood drawing area for the tests to be done - just the urine testing today  You will be contacted by phone if any changes need to be made immediately.  Otherwise, you will receive a letter about your results with an explanation, but please check with MyChart first.  Please remember to sign up for MyChart if you have not done so, as this will be important to you in the future with finding out test results, communicating by private email, and scheduling acute appointments online when needed.

## 2021-09-24 NOTE — Progress Notes (Signed)
Chief Complaint: follow up HTN, dysuria, depression       HPI:  Sophia Santos is a 87 y.o. female here with c/o 2 days onset dysuria and freq but Denies urinary symptoms such as urgency, flank pain, hematuria or n/v, fever, chills.  BP has been mild elevated recently, Pt denies chest pain, increased sob or doe, wheezing, orthopnea, PND, increased LE swelling, palpitations, dizziness or syncope.   Pt denies polydipsia, polyuria, Has had mild worsening depressive symptoms, but no suicidal ideation, or panic; has ongoing anxiety, mild recently.         Wt Readings from Last 3 Encounters:  09/24/21 131 lb 9.6 oz (59.7 kg)  09/04/21 135 lb 3.2 oz (61.3 kg)  08/21/21 129 lb 9.6 oz (58.8 kg)   BP Readings from Last 3 Encounters:  09/24/21 (!) 148/80  09/04/21 135/61  08/21/21 (!) 144/85         Past Medical History:  Diagnosis Date   Arthritis    Complication of anesthesia    "years ago, hard to awaken, no with recent procedures."   Diverticulosis    Endometrial cancer ([place])    Gallstones    GERD (gastroesophageal reflux disease)    Headache 08/15/2021   none recent   HTN (hypertension)    Hyperlipidemia    Irritable bowel syndrome (IBS)    Osteoarthritis    Osteopenia    Osteoporosis    PMB (postmenopausal bleeding) 08/15/2021   Skin cancer    skin cancer -    Urinary frequency 08/15/2021   Wears glasses 08/15/2021   Past Surgical History:  Procedure Laterality Date   bilateral cataracts     COLONOSCOPY     INGUINAL HERNIA REPAIR Bilateral 09/13/2020   Procedure: LAPAROSCOPIC BILATERAL INGUINAL HERNIA REPAIR WITH MESH;  Surgeon: Ralene Ok, MD;  Location: [place];  Service: General;  Laterality: Bilateral;   ROBOTIC ASSISTED TOTAL HYSTERECTOMY WITH BILATERAL SALPINGO OOPHERECTOMY Bilateral 08/21/2021   Procedure: XI ROBOTIC ASSISTED TOTAL HYSTERECTOMY WITH BILATERAL SALPINGO OOPHORECTOMY;  Surgeon: Everitt Amber, MD;  Location: [place];  Service:  Gynecology;  Laterality: Bilateral;   SENTINEL NODE BIOPSY N/A 08/21/2021   Procedure: SENTINEL NODE BIOPSY;  Surgeon: Everitt Amber, MD;  Location: [facility];  Service: Gynecology;  Laterality: N/A;   uterine prolapse surgery      reports that she has never smoked. She has never used smokeless tobacco. She reports that she does not drink alcohol and does not use drugs. family history includes Colon cancer in her sister; Colon cancer (age of onset: 35) in her mother; Colon polyps in her sister; Coronary artery disease in an other family member; Dementia in her father; Parkinson's disease in her father; Stroke in her mother. Allergies  Allergen Reactions   Fosamax [Alendronate Sodium]     pains   Lipitor [Atorvastatin]     arthralgias   Voltaren [Diclofenac Sodium]     Skin rash from gel   Zocor [Simvastatin]     REACTION: cramp   Current Outpatient Medications on File Prior to Visit  Medication Sig Dispense Refill   acetaminophen (TYLENOL) 325 MG tablet Take 352 mg by mouth every 6 (six) hours as needed for mild pain or moderate pain.     ALPRAZolam (XANAX) 1 MG tablet Take 0.5-1 tablets (0.5-1 mg total) by mouth at bedtime as needed for anxiety or sleep. 90 tablet 1   captopril (CAPOTEN) 50 MG tablet TAKE 1 TABLET BY  MOUTH TWICE A DAY 180 tablet 3   Cholecalciferol 1000 UNITS tablet Take 1,000 Units by mouth daily. Vitamin d     Coenzyme Q10 (CO Q-10) 100 MG CAPS Take by mouth daily.     ezetimibe (ZETIA) 10 MG tablet TAKE 1 TABLET BY MOUTH EVERY DAY 90 tablet 3   MYRBETRIQ 25 MG TB24 tablet Take 1 tablet (25 mg total) by mouth daily. (Patient taking differently: Take 25 mg by mouth at bedtime.) 90 tablet 3   omeprazole (PRILOSEC) 20 MG capsule Take 2 capsules (40 mg total) by mouth daily. Annual appt due in March must see provider for future refills (Patient taking differently: Take 20 mg by mouth daily. Prn at hs also) 180 capsule 3   Pancrelipase, Lip-Prot-Amyl, (CREON)  24000-76000 units CPEP TAKE ONE CAPSULE BY MOUTH 3 TIMES A DAY WITH FOOD (Patient taking differently: as needed. TAKE ONE CAPSULE BY MOUTH 3 TIMES A DAY WITH FOOD) 270 capsule 3   polyethylene glycol powder (GAVILAX) 17 GM/SCOOP powder MIX 1 CAPFUL (17 GRAMS) AS DIRECTED AND DRINK TWICE A DAY AS NEEDED FOR MODERATE CONSTIPATION 510 g 5   propranolol ER (INDERAL LA) 120 MG 24 hr capsule Take 1 capsule (120 mg total) by mouth daily. 90 capsule 3   RESTASIS 0.05 % ophthalmic emulsion Place 1 drop into both eyes 2 (two) times daily.      senna-docusate (SENOKOT-S) 8.6-50 MG tablet Take 2 tablets by mouth at bedtime. For AFTER surgery, do not take if having diarrhea 30 tablet 0   Vitamin A 2400 MCG (8000 UT) CAPS Take 2,400 mcg by mouth daily.     Omega 3 1000 MG CAPS Take 1,000 mg by mouth daily. (Patient not taking: No sig reported)     No current facility-administered medications on file prior to visit.        ROS:  All others reviewed and negative.  Objective        PE:  BP (!) 148/80 (BP Location: Left Arm, Patient Position: Sitting, Cuff Size: Normal)   Pulse 78   Ht 5' (1.524 m)   Wt 131 lb 9.6 oz (59.7 kg)   SpO2 97%   BMI 25.70 kg/m                 Constitutional: Pt appears in NAD               HENT: Head: NCAT.                Right Ear: External ear normal.                 Left Ear: External ear normal.                Eyes: . Pupils are equal, round, and reactive to light. Conjunctivae and EOM are normal               Nose: without d/c or deformity               Neck: Neck supple. Gross normal ROM               Cardiovascular: Normal rate and regular rhythm.                 Pulmonary/Chest: Effort normal and breath sounds without rales or wheezing.                Abd:  Soft, mild low mid abd tender, ND, + BS, no  organomegaly               Neurological: Pt is alert. At baseline orientation, motor grossly intact               Skin: Skin is warm. No rashes, no other new lesions,  LE edema - none               Psychiatric: Pt behavior is normal without agitation, mild depressed affect   Micro: none  Cardiac tracings I have personally interpreted today:  none  Pertinent Radiological findings (summarize): none   Lab Results  Component Value Date   WBC 7.0 08/16/2021   HGB 6.8 (LL) 08/21/2021   HCT 20.0 (L) 08/21/2021   PLT 242 08/16/2021   GLUCOSE 91 08/21/2021   CHOL 238 (H) 05/06/2021   TRIG 106.0 05/06/2021   HDL 65.10 05/06/2021   LDLDIRECT 143.0 03/02/2018   LDLCALC 152 (H) 05/06/2021   ALT 31 08/16/2021   AST 29 08/16/2021   NA 137 08/21/2021   K 3.8 08/21/2021   CL 104 08/21/2021   CREATININE 0.70 08/21/2021   BUN 19 08/21/2021   CO2 25 08/16/2021   TSH 1.39 05/06/2021   INR 1.0 02/23/2020   HGBA1C 5.9 09/29/2007   Assessment/Plan:  Sophia Santos is a 87 y.o. White or Caucasian [1] female with  has a past medical history of Arthritis, Complication of anesthesia, Diverticulosis, Endometrial cancer ([place]), Gallstones, GERD (gastroesophageal reflux disease), Headache (08/15/2021), HTN (hypertension), Hyperlipidemia, Irritable bowel syndrome (IBS), Osteoarthritis, Osteopenia, Osteoporosis, PMB (postmenopausal bleeding) (08/15/2021), Skin cancer, Urinary frequency (08/15/2021), and Wears glasses (08/15/2021).  Anxiety disorder Mild situational worse, declines change in tx for now,  to f/u any worsening symptoms or concerns  Essential hypertension BP Readings from Last 3 Encounters:  09/24/21 (!) 148/80  09/04/21 135/61  08/21/21 (!) 144/85   Mild unconrolled, to add/start amlodopine 5 qd   Dysuria High suspicion uti - for urine culture, keflex asd,  to f/u any worsening symptoms or concerns  Followup: Return if symptoms worsen or fail to improve.  Cathlean Cower, MD 09/27/2021 9:22 PM [facility]

## 2021-09-25 ENCOUNTER — Encounter: Payer: Self-pay | Admitting: Internal Medicine

## 2021-09-25 LAB — URINALYSIS, ROUTINE W REFLEX MICROSCOPIC
Bilirubin Urine: NEGATIVE
Nitrite: NEGATIVE
Specific Gravity, Urine: 1.015 (ref 1.000–1.030)
Total Protein, Urine: NEGATIVE
Urine Glucose: NEGATIVE
Urobilinogen, UA: 0.2 (ref 0.0–1.0)
pH: 6 (ref 5.0–8.0)

## 2021-09-26 ENCOUNTER — Encounter: Payer: Self-pay | Admitting: Internal Medicine

## 2021-09-26 LAB — URINE CULTURE

## 2021-09-26 NOTE — Progress Notes (Signed)
GYN Location of Tumor / Histology: Endometrial  Sophia Santos presented with symptoms of:  postmenopausal bleeding  Biopsies revealed:    Past/Anticipated interventions by Gyn/Onc surgery, if any:  Operation: Robotic-assisted laparoscopic total hysterectomy with bilateral salpingoophorectomy, SLN biopsy   Surgeon: Donaciano Eva  Past/Anticipated interventions by medical oncology, if any: Dr Denman George - I am recommending chemotherapy for this advanced disease with consideration for vaginal brachytherapy to control risk of local recurrence.  Weight changes, if any: no  Bowel/Bladder complaints, if any: Yes.  ,  cystitis, constipation, nocturia x 4-6, urgency, dysuria, incomplete bladder emptying  Nausea/Vomiting, if any: no  Pain issues, if any:  no  SAFETY ISSUES: Prior radiation? no Pacemaker/ICD? no Possible current pregnancy? no, postmenopausal Is the patient on methotrexate? no  Current Complaints / other details:  weakness  Vitals:   10/02/21 1046  BP: (!) 144/82  Pulse: 81  Resp: 18  Temp: (!) 97.3 F (36.3 C)  SpO2: 100%  Weight: 131 lb 12.8 oz (59.8 kg)  Height: 5' (1.524 m)

## 2021-09-27 ENCOUNTER — Encounter: Payer: Self-pay | Admitting: Internal Medicine

## 2021-09-27 NOTE — Assessment & Plan Note (Signed)
BP Readings from Last 3 Encounters:  09/24/21 (!) 148/80  09/04/21 135/61  08/21/21 (!) 144/85   Mild unconrolled, to add/start amlodopine 5 qd

## 2021-09-27 NOTE — Assessment & Plan Note (Signed)
Mild situational worse, declines change in tx for now,  to f/u any worsening symptoms or concerns

## 2021-09-27 NOTE — Assessment & Plan Note (Signed)
High suspicion uti - for urine culture, keflex asd,  to f/u any worsening symptoms or concerns

## 2021-09-30 ENCOUNTER — Ambulatory Visit (HOSPITAL_COMMUNITY)
Admission: RE | Admit: 2021-09-30 | Discharge: 2021-09-30 | Disposition: A | Discharge status: Home / Self Care | Payer: Medicare Other | Source: Ambulatory Visit | Attending: Gynecologic Oncology | Admitting: Gynecologic Oncology

## 2021-09-30 ENCOUNTER — Other Ambulatory Visit: Payer: Self-pay

## 2021-09-30 DIAGNOSIS — K760 Fatty (change of) liver, not elsewhere classified: Secondary | ICD-10-CM | POA: Diagnosis not present

## 2021-09-30 DIAGNOSIS — C541 Malignant neoplasm of endometrium: Secondary | ICD-10-CM | POA: Diagnosis present

## 2021-09-30 LAB — POCT I-STAT CREATININE: Creatinine, Ser: 0.6 mg/dL (ref 0.44–1.00)

## 2021-09-30 MED ORDER — IOHEXOL 350 MG/ML SOLN
75.0000 mL | Freq: Once | INTRAVENOUS | Status: AC | PRN
  Administered 2021-09-30: 75 mL via INTRAVENOUS

## 2021-10-01 ENCOUNTER — Telehealth: Payer: Self-pay

## 2021-10-01 NOTE — Progress Notes (Signed)
Radiation Oncology         [phone] ________________________________  Initial Outpatient Consultation  Name: Sophia Santos MRN: [id number]  Date: 10/02/2021  DOB: 1934-10-15  WU:[id number], Evie Lacks, MD  Everitt Amber, MD   REFERRING PHYSICIAN: Everitt Amber, MD  DIAGNOSIS: The encounter diagnosis was Endometrial adenocarcinoma [facility]).   87 y.o. year old P56 russian speaking [place] woman with stage IIIA grade 2 endometrial cancer.  HISTORY OF PRESENT ILLNESS::Sophia Santos is a 87 y.o. female who is accompanied by daughter-in-law as well as [place] interpreter. she is seen as a courtesy of Dr. Denman George for an opinion concerning radiation therapy as part of management for her recently diagnosed endometrioid endometrial adenocarcinoma. Her symptoms began in early August of 2022 with heavy postmenopausal bleeding . She then saw her PCP, Tye Savoy, NP, on 07/18/21 for a problem visit due to the above symptoms.   She was then referred to Dr Kennon Rounds, Gynecology, who saw the patient on 07/31/21. Work-up of symptoms included a transvaginal US and endometrial biopsy. Transvaginal US on 07/19/2021 showed a uterus measuring 7.6 x 4.1 x 4.3 cm with fibroids seen within the uterus, and the endometrium measuring 5.6 mm.  The ovaries bilaterally appeared grossly normal.   Endometrial sampling performed on 07/31/2021 showed FIGO grade 1 endometrioid endometrial adenocarcinoma.   Accordingly, the patient was referred to Dr. Denman George by Dr. Kennon Rounds on 08/07/21 for further evaluation. After counseling and consideration of her options, the patient opted to proceed with robotic assisted total hysterectomy with bilateral sapingo-oophorectomy and SLN biopsy.   Pathology from hysterectomy and BSO revealed FIGO grade 2 endometrioid adenocarcinoma, with carcinoma invading through the entire myometrium and focally involving the serosal surface. Cervix, bilateral fallopian tubes and ovaries were benign. Right and left obturator  SLN biopsies were both negative for carcinoma. (pT3a, N0)  Following her procedure, the patient followed-up with Dr. Denman George on 09/04/21. During which time, the patient was recommended to proceed with treatment consisting of chemotherapy, with consideration of vaginal brachytherapy to control the risk of local recurrence.   Recent imaging includes a CT of the abdomen and pelvis on 09/30/21 which demonstrated no evidence of metastatic disease. Other findings include hepatic steatosis and left adrenal adenoma.  Of note: Recently, the patient presented to Dr. Winfield Cunas at [facility], with 2 days of dysuria and frequency. Patient was determined to likely have UTI and prescribed Keflex.     PREVIOUS RADIATION THERAPY: No  PAST MEDICAL HISTORY:  Past Medical History:  Diagnosis Date   Arthritis    Complication of anesthesia    "years ago, hard to awaken, no with recent procedures."   Diverticulosis    Endometrial cancer ([place])    Gallstones    GERD (gastroesophageal reflux disease)    Headache 08/15/2021   none recent   HTN (hypertension)    Hyperlipidemia    Irritable bowel syndrome (IBS)    Osteoarthritis    Osteopenia    Osteoporosis    PMB (postmenopausal bleeding) 08/15/2021   Skin cancer    skin cancer -    Urinary frequency 08/15/2021   Wears glasses 08/15/2021    PAST SURGICAL HISTORY: Past Surgical History:  Procedure Laterality Date   bilateral cataracts     COLONOSCOPY     INGUINAL HERNIA REPAIR Bilateral 09/13/2020   Procedure: LAPAROSCOPIC BILATERAL INGUINAL HERNIA REPAIR WITH MESH;  Surgeon: Ralene Ok, MD;  Location: [facility];  Service: General;  Laterality: Bilateral;   ROBOTIC ASSISTED TOTAL HYSTERECTOMY WITH BILATERAL  SALPINGO OOPHERECTOMY Bilateral 08/21/2021   Procedure: XI ROBOTIC ASSISTED TOTAL HYSTERECTOMY WITH BILATERAL SALPINGO OOPHORECTOMY;  Surgeon: Everitt Amber, MD;  Location: [facility];  Service: Gynecology;  Laterality:  Bilateral;   SENTINEL NODE BIOPSY N/A 08/21/2021   Procedure: SENTINEL NODE BIOPSY;  Surgeon: Everitt Amber, MD;  Location: [facility];  Service: Gynecology;  Laterality: N/A;   uterine prolapse surgery      FAMILY HISTORY:  Family History  Problem Relation Age of Onset   Colon cancer Mother 70       colorectal   Stroke Mother    Parkinson's disease Father    Dementia Father    Colon cancer Sister        colorectal   Colon polyps Sister    Coronary artery disease Other        female 1st degree <50   Esophageal cancer Neg Hx    Stomach cancer Neg Hx    Anesthesia problems Neg Hx    Breast cancer Neg Hx    Endometrial cancer Neg Hx    Pancreatic cancer Neg Hx    Prostate cancer Neg Hx     SOCIAL HISTORY: She lives by herself at this time.  She is been depressed particularly since the death of her only son recently. Social History   Tobacco Use   Smoking status: Never   Smokeless tobacco: Never  Vaping Use   Vaping Use: Never used  Substance Use Topics   Alcohol use: No   Drug use: No    ALLERGIES:  Allergies  Allergen Reactions   Fosamax [Alendronate Sodium]     pains   Lipitor [Atorvastatin]     arthralgias   Voltaren [Diclofenac Sodium]     Skin rash from gel   Zocor [Simvastatin]     REACTION: cramp    MEDICATIONS:  Current Outpatient Medications  Medication Sig Dispense Refill   acetaminophen (TYLENOL) 325 MG tablet Take 352 mg by mouth every 6 (six) hours as needed for mild pain or moderate pain.     ALPRAZolam (XANAX) 1 MG tablet Take 0.5-1 tablets (0.5-1 mg total) by mouth at bedtime as needed for anxiety or sleep. 90 tablet 1   amLODipine (NORVASC) 5 MG tablet Take 1 tablet (5 mg total) by mouth daily. 90 tablet 3   captopril (CAPOTEN) 50 MG tablet TAKE 1 TABLET BY MOUTH TWICE A DAY 180 tablet 3   cephALEXin (KEFLEX) 500 MG capsule Take 1 capsule (500 mg total) by mouth 3 (three) times daily for 10 days. 30 capsule 0   Cholecalciferol 1000  UNITS tablet Take 1,000 Units by mouth daily. Vitamin d     ezetimibe (ZETIA) 10 MG tablet TAKE 1 TABLET BY MOUTH EVERY DAY 90 tablet 3   MYRBETRIQ 25 MG TB24 tablet Take 1 tablet (25 mg total) by mouth daily. (Patient taking differently: Take 25 mg by mouth at bedtime.) 90 tablet 3   omeprazole (PRILOSEC) 20 MG capsule Take 2 capsules (40 mg total) by mouth daily. Annual appt due in March must see provider for future refills (Patient taking differently: Take 20 mg by mouth daily. Prn at hs also) 180 capsule 3   polyethylene glycol powder (GAVILAX) 17 GM/SCOOP powder MIX 1 CAPFUL (17 GRAMS) AS DIRECTED AND DRINK TWICE A DAY AS NEEDED FOR MODERATE CONSTIPATION 510 g 5   propranolol ER (INDERAL LA) 120 MG 24 hr capsule Take 1 capsule (120 mg total) by mouth daily. 90 capsule 3  RESTASIS 0.05 % ophthalmic emulsion Place 1 drop into both eyes 2 (two) times daily.      senna-docusate (SENOKOT-S) 8.6-50 MG tablet Take 2 tablets by mouth at bedtime. For AFTER surgery, do not take if having diarrhea 30 tablet 0   Vitamin A 2400 MCG (8000 UT) CAPS Take 2,400 mcg by mouth daily.     Coenzyme Q10 (CO Q-10) 100 MG CAPS Take by mouth daily. (Patient not taking: Reported on 10/02/2021)     Omega 3 1000 MG CAPS Take 1,000 mg by mouth daily. (Patient not taking: No sig reported)     Pancrelipase, Lip-Prot-Amyl, (CREON) 24000-76000 units CPEP TAKE ONE CAPSULE BY MOUTH 3 TIMES A DAY WITH FOOD (Patient taking differently: as needed. TAKE ONE CAPSULE BY MOUTH 3 TIMES A DAY WITH FOOD) 270 capsule 3   No current facility-administered medications for this encounter.    REVIEW OF SYSTEMS:  A 10+ POINT REVIEW OF SYSTEMS WAS OBTAINED including neurology, dermatology, psychiatry, cardiac, respiratory, lymph, extremities, GI, GU, musculoskeletal, constitutional, reproductive, HEENT.  She denies any pelvic pain vaginal bleeding or discharge.  She denies any further urinary symptoms or bowel complaints.  She does complain of  some swelling in her bilateral lower extremities.   PHYSICAL EXAM:  height is 5' (1.524 m) and weight is 131 lb 12.8 oz (59.8 kg). Her temperature is 97.3 F (36.3 C) (abnormal). Her blood pressure is 144/82 (abnormal) and her pulse is 81. Her respiration is 18 and oxygen saturation is 100%.   General: Alert and oriented, in no acute distress HEENT: Head is normocephalic. Extraocular movements are intact.  Neck: Neck is supple, no palpable cervical or supraclavicular lymphadenopathy. Heart: Regular in rate and rhythm with no murmurs, rubs, or gallops. Chest: Clear to auscultation bilaterally, with no rhonchi, wheezes, or rales. Abdomen: Soft, nontender, nondistended, with no rigidity or guarding.  Laparoscopic scars well-healed. Extremities: No cyanosis or edema. Lymphatics: see Neck Exam Skin: No concerning lesions. Musculoskeletal: symmetric strength and muscle tone throughout. Neurologic: Cranial nerves II through XII are grossly intact. No obvious focalities. Speech is fluent. Coordination is intact. Psychiatric: Judgment and insight are intact. Affect is appropriate. Pelvic exam deferred in light of recent surgery will be performed at the time of the patient's brachytherapy planning and treatment.  ECOG = 1  0 - Asymptomatic (Fully active, able to carry on all predisease activities without restriction)  1 - Symptomatic but completely ambulatory (Restricted in physically strenuous activity but ambulatory and able to carry out work of a light or sedentary nature. For example, light housework, office work)  2 - Symptomatic, <50% in bed during the day (Ambulatory and capable of all self care but unable to carry out any work activities. Up and about more than 50% of waking hours)  3 - Symptomatic, >50% in bed, but not bedbound (Capable of only limited self-care, confined to bed or chair 50% or more of waking hours)  4 - Bedbound (Completely disabled. Cannot carry on any self-care. Totally  confined to bed or chair)  5 - Death   Eustace Pen MM, Creech RH, Tormey DC, et al. [phone]). "Toxicity and response criteria of the [facility]". [place] Oncol. 5 (6): 649-55  LABORATORY DATA:  Lab Results  Component Value Date   WBC 7.0 08/16/2021   HGB 6.8 (LL) 08/21/2021   HCT 20.0 (L) 08/21/2021   MCV 96.8 08/16/2021   PLT 242 08/16/2021   NEUTROABS 2.3 05/06/2021   Lab Results  Component Value Date   NA 137 08/21/2021   K 3.8 08/21/2021   CL 104 08/21/2021   CO2 25 08/16/2021   GLUCOSE 91 08/21/2021   CREATININE 0.60 09/30/2021   CALCIUM 8.8 (L) 08/16/2021      RADIOGRAPHY: CT Abdomen Pelvis W Contrast  Result Date: 10/01/2021 CLINICAL DATA:  Endometrial cancer, value 8 for metastatic disease. EXAM: CT ABDOMEN AND PELVIS WITH CONTRAST TECHNIQUE: Multidetector CT imaging of the abdomen and pelvis was performed using the standard protocol following bolus administration of intravenous contrast. CONTRAST:  39mL OMNIPAQUE IOHEXOL 350 MG/ML SOLN COMPARISON:  09/30/2016. FINDINGS: Lower chest: Scattered scarring in the lung bases. Heart is at the upper limits of normal in size. No pericardial or pleural effusion. Distal esophagus is grossly unremarkable. Hepatobiliary: Liver is slightly decreased in attenuation diffusely. Liver and gallbladder are otherwise unremarkable. No biliary ductal dilatation. Pancreas: Negative. Spleen: Negative. Adrenals/Urinary Tract: Right adrenal gland is unremarkable. 1.6 cm nodule in the medial limb left adrenal gland is unchanged from 09/30/2016. Right kidney is unremarkable. Subcentimeter low-attenuation lesions in the left kidney are too small to characterize but statistically, cysts are likely. Ureters are decompressed. Bladder is grossly unremarkable. Stomach/Bowel: Tiny hiatal hernia. Stomach is decompressed. Stomach, small bowel, appendix and colon are otherwise unremarkable. Vascular/Lymphatic: Atherosclerotic calcification of  the aorta. No pathologically enlarged lymph nodes. Reproductive: Hysterectomy.  No adnexal mass. Other: Pelvic ventral hernia repair. No free fluid. Mesenteries and peritoneum are unremarkable. Musculoskeletal: Degenerative changes in the spine. No worrisome lytic or sclerotic lesions. IMPRESSION: 1. No evidence of metastatic disease. 2. Hepatic steatosis. 3. Left adrenal adenoma. 4.  Aortic atherosclerosis (ICD10-I70.0). Electronically Signed   By: Lorin Picket M.D.   On: 10/01/2021 11:08      IMPRESSION: 87 y.o. year old P69 russian speaking [place] woman with stage IIIA grade 2 endometrial cancer.  Pathologic findings significant for full-thickness involvement of the myometrium with serosal involvement.  I discussed with patient and her daughter-in-law that she would be at risk for recurrence given these pathologic findings and recommendations were for adjuvant chemotherapy as well as vaginal brachytherapy to reduce chances for proximal vaginal recurrence.  The patient will meet with Dr. Alvy Bimler later this week for discussion of adjuvant chemotherapy.  Today, I talked to the patient and daughter-in-law about the findings and work-up thus far.  We discussed the natural history of endometrial cancer and general treatment, highlighting the role of radiotherapy in the management.  We discussed the available radiation techniques, and focused on the details of logistics and delivery.  We reviewed the anticipated acute and late sequelae associated with radiation in this setting.  We discussed the anticipated recurrence rate within the vaginal cuff area with and without radiation therapy. the patient was encouraged to ask questions that I answered to the best of my ability.  At this point the patient is unsure whether she will proceed with any adjuvant therapy given her advanced age.  She does understand that she would be at much higher risk for recurrence without any adjuvant treatment.  PLAN: Treatment  planning pending input from medical oncology.     60 minutes of total time was spent for this patient encounter, including preparation, face-to-face counseling with the patient and coordination of care, physical exam, and documentation of the encounter.   ------------------------------------------------  Blair Promise, PhD, MD  This document serves as a record of services personally performed by Gery Pray, MD. It was created on his behalf by Roney Mans, a trained  medical scribe. The creation of this record is based on the scribe's personal observations and the provider's statements to them. This document has been checked and approved by the attending provider.

## 2021-10-01 NOTE — Telephone Encounter (Signed)
Told daughter-in-law that the CT showed no evidence of metastatic disease. She has a fatty liver, a left adrenal adenoma that is stable from 2017, and aortic atherosclerosis.These diagnosis can be followed by her PCP per Joylene John, NP [place] verbalized understanding. She will inform  Lenda Kelp of the results.

## 2021-10-02 ENCOUNTER — Ambulatory Visit
Admission: RE | Admit: 2021-10-02 | Discharge: 2021-10-02 | Disposition: A | Discharge status: Home / Self Care | Payer: Medicare Other | Source: Ambulatory Visit | Attending: Radiation Oncology | Admitting: Radiation Oncology

## 2021-10-02 ENCOUNTER — Encounter: Payer: Medicare Other | Admitting: Genetic Counselor

## 2021-10-02 ENCOUNTER — Other Ambulatory Visit: Payer: Self-pay

## 2021-10-02 ENCOUNTER — Encounter: Payer: Self-pay | Admitting: Radiation Oncology

## 2021-10-02 VITALS — BP 144/82 | HR 81 | Temp 97.3°F | Resp 18 | Ht 60.0 in | Wt 131.8 lb

## 2021-10-02 DIAGNOSIS — Z79899 Other long term (current) drug therapy: Secondary | ICD-10-CM | POA: Diagnosis not present

## 2021-10-02 DIAGNOSIS — K760 Fatty (change of) liver, not elsewhere classified: Secondary | ICD-10-CM | POA: Diagnosis not present

## 2021-10-02 DIAGNOSIS — C541 Malignant neoplasm of endometrium: Secondary | ICD-10-CM | POA: Insufficient documentation

## 2021-10-02 DIAGNOSIS — E785 Hyperlipidemia, unspecified: Secondary | ICD-10-CM | POA: Diagnosis not present

## 2021-10-02 DIAGNOSIS — I700 Atherosclerosis of aorta: Secondary | ICD-10-CM | POA: Insufficient documentation

## 2021-10-02 DIAGNOSIS — Z800 Family history of malignant neoplasm of digestive organs: Secondary | ICD-10-CM | POA: Diagnosis not present

## 2021-10-02 DIAGNOSIS — K589 Irritable bowel syndrome without diarrhea: Secondary | ICD-10-CM | POA: Diagnosis not present

## 2021-10-02 DIAGNOSIS — I10 Essential (primary) hypertension: Secondary | ICD-10-CM | POA: Diagnosis not present

## 2021-10-02 DIAGNOSIS — D3502 Benign neoplasm of left adrenal gland: Secondary | ICD-10-CM | POA: Diagnosis not present

## 2021-10-02 DIAGNOSIS — Z85828 Personal history of other malignant neoplasm of skin: Secondary | ICD-10-CM | POA: Diagnosis not present

## 2021-10-02 DIAGNOSIS — M1990 Unspecified osteoarthritis, unspecified site: Secondary | ICD-10-CM | POA: Diagnosis not present

## 2021-10-02 DIAGNOSIS — M129 Arthropathy, unspecified: Secondary | ICD-10-CM | POA: Diagnosis not present

## 2021-10-02 DIAGNOSIS — M810 Age-related osteoporosis without current pathological fracture: Secondary | ICD-10-CM | POA: Insufficient documentation

## 2021-10-02 DIAGNOSIS — K219 Gastro-esophageal reflux disease without esophagitis: Secondary | ICD-10-CM | POA: Diagnosis not present

## 2021-10-02 DIAGNOSIS — K449 Diaphragmatic hernia without obstruction or gangrene: Secondary | ICD-10-CM | POA: Insufficient documentation

## 2021-10-02 NOTE — Progress Notes (Signed)
See MD note for nursing evaluation. °

## 2021-10-03 ENCOUNTER — Other Ambulatory Visit: Payer: Self-pay

## 2021-10-03 ENCOUNTER — Inpatient Hospital Stay: Payer: Medicare Other

## 2021-10-03 ENCOUNTER — Other Ambulatory Visit: Payer: Self-pay | Admitting: Hematology and Oncology

## 2021-10-03 ENCOUNTER — Inpatient Hospital Stay: Payer: Medicare Other | Attending: Gynecologic Oncology | Admitting: Hematology and Oncology

## 2021-10-03 ENCOUNTER — Encounter: Payer: Self-pay | Admitting: Hematology and Oncology

## 2021-10-03 VITALS — BP 157/81 | HR 67 | Temp 97.7°F | Resp 18 | Ht 60.0 in | Wt 132.4 lb

## 2021-10-03 DIAGNOSIS — D539 Nutritional anemia, unspecified: Secondary | ICD-10-CM | POA: Diagnosis not present

## 2021-10-03 DIAGNOSIS — C541 Malignant neoplasm of endometrium: Secondary | ICD-10-CM

## 2021-10-03 DIAGNOSIS — Z7189 Other specified counseling: Secondary | ICD-10-CM | POA: Insufficient documentation

## 2021-10-03 DIAGNOSIS — I10 Essential (primary) hypertension: Secondary | ICD-10-CM | POA: Insufficient documentation

## 2021-10-03 DIAGNOSIS — Z808 Family history of malignant neoplasm of other organs or systems: Secondary | ICD-10-CM | POA: Diagnosis not present

## 2021-10-03 DIAGNOSIS — Z809 Family history of malignant neoplasm, unspecified: Secondary | ICD-10-CM | POA: Insufficient documentation

## 2021-10-03 LAB — CBC WITH DIFFERENTIAL/PLATELET
Abs Immature Granulocytes: 0.01 10*3/uL (ref 0.00–0.07)
Basophils Absolute: 0 10*3/uL (ref 0.0–0.1)
Basophils Relative: 1 %
Eosinophils Absolute: 0.1 10*3/uL (ref 0.0–0.5)
Eosinophils Relative: 2 %
HCT: 35.1 % — ABNORMAL LOW (ref 36.0–46.0)
Hemoglobin: 11.4 g/dL — ABNORMAL LOW (ref 12.0–15.0)
Immature Granulocytes: 0 %
Lymphocytes Relative: 31 %
Lymphs Abs: 1.7 10*3/uL (ref 0.7–4.0)
MCH: 31.4 pg (ref 26.0–34.0)
MCHC: 32.5 g/dL (ref 30.0–36.0)
MCV: 96.7 fL (ref 80.0–100.0)
Monocytes Absolute: 0.6 10*3/uL (ref 0.1–1.0)
Monocytes Relative: 11 %
Neutro Abs: 3.1 10*3/uL (ref 1.7–7.7)
Neutrophils Relative %: 55 %
Platelets: 251 10*3/uL (ref 150–400)
RBC: 3.63 MIL/uL — ABNORMAL LOW (ref 3.87–5.11)
RDW: 13.3 % (ref 11.5–15.5)
WBC: 5.5 10*3/uL (ref 4.0–10.5)
nRBC: 0 % (ref 0.0–0.2)

## 2021-10-03 LAB — CMP (CANCER CENTER ONLY)
ALT: 30 U/L (ref 0–44)
AST: 23 U/L (ref 15–41)
Albumin: 3.9 g/dL (ref 3.5–5.0)
Alkaline Phosphatase: 139 U/L — ABNORMAL HIGH (ref 38–126)
Anion gap: 11 (ref 5–15)
BUN: 21 mg/dL (ref 8–23)
CO2: 25 mmol/L (ref 22–32)
Calcium: 9.3 mg/dL (ref 8.9–10.3)
Chloride: 99 mmol/L (ref 98–111)
Creatinine: 0.62 mg/dL (ref 0.44–1.00)
GFR, Estimated: >60 mL/min (ref >60)
Glucose, Bld: 111 mg/dL — ABNORMAL HIGH (ref 70–99)
Potassium: 4 mmol/L (ref 3.5–5.1)
Sodium: 135 mmol/L (ref 135–145)
Total Bilirubin: 0.6 mg/dL (ref 0.3–1.2)
Total Protein: 7 g/dL (ref 6.5–8.1)

## 2021-10-03 LAB — SAMPLE TO BLOOD BANK

## 2021-10-03 LAB — VITAMIN B12: Vitamin B-12: 813 pg/mL (ref 180–914)

## 2021-10-03 MED ORDER — ONDANSETRON HCL 8 MG PO TABS: 8.0000 mg | ORAL_TABLET | Freq: Two times a day (BID) | ORAL | 1 refills | Status: DC | PRN

## 2021-10-03 MED ORDER — PROCHLORPERAZINE MALEATE 10 MG PO TABS: 10.0000 mg | ORAL_TABLET | Freq: Four times a day (QID) | ORAL | 1 refills | Status: DC | PRN

## 2021-10-03 NOTE — Assessment & Plan Note (Signed)
She has multifactorial anemia, related to severe postmenopausal bleeding and recent surgical blood loss Her blood count has improved dramatically after transfusion support and time away from surgery I recommend close observation for now

## 2021-10-03 NOTE — Progress Notes (Signed)
START OFF PATHWAY REGIMEN - Uterine   [id number]:Carboplatin AUC=5 q21 Days:   A cycle is every 21 days:     Carboplatin   **Always confirm dose/schedule in your pharmacy ordering system**  Patient Characteristics: Endometrioid, Newly Diagnosed, Postoperative (Pathologic Staging), Stage IIIA - Grade 1, 2, or 3 Histology: Endometrioid Therapeutic Status: Newly Diagnosed, Postoperative (Pathologic Staging) AJCC M Category: cM0 AJCC 8 Stage Grouping: III AJCC T Category: pT3 AJCC N Category: pN0 Intent of Therapy: Curative Intent, Discussed with Patient

## 2021-10-03 NOTE — Assessment & Plan Note (Signed)
I will refer her to see genetic counselor for genetic testing

## 2021-10-03 NOTE — Progress Notes (Signed)
[facility] NOTE  Patient Care Team: Plotnikov, Evie Lacks, MD as PCP - General Azucena Fallen, MD (Obstetrics and Gynecology) Ralene Ok, MD as Consulting Physician (General Surgery) Milus Banister, MD as Attending Physician (Gastroenterology) Jacqulyn Liner, RN as Oncology Nurse Navigator (Oncology)  ASSESSMENT & PLAN:  Endometrial adenocarcinoma [facility]) I am concerned about her wellbeing She is weak and frail with surgery Even though the current guidelines support adjuvant chemotherapy with carboplatin and paclitaxel, and doubtful that she can tolerate combination chemotherapy For her, I would recommend single agent carboplatin  We reviewed the NCCN guidelines We discussed the role of chemotherapy. The intent is of curative intent.  We discussed some of the risks, benefits, side-effects of carboplatin. Treatment is intravenous, every 3 weeks x 6 cycles  Some of the short term side-effects included, though not limited to, including weight loss, life threatening infections, risk of allergic reactions, need for transfusions of blood products, nausea, vomiting, change in bowel habits, admission to hospital for various reasons, and risks of death.  The patient is aware that the response rates discussed earlier is not guaranteed.  After a long discussion, patient made an informed decision to proceed with the prescribed plan of care.   Patient education material was dispensed. She does not need port placement We will schedule chemo education class I recommend we wait another 2 weeks to allow recovery from recent surgery before we start adjuvant treatment She is in agreement to proceed   Deficiency anemia She has multifactorial anemia, related to severe postmenopausal bleeding and recent surgical blood loss Her blood count has improved dramatically after transfusion support and time away from surgery I recommend close observation for now  Goals of care,  counseling/discussion We had extensive discussions about goals of care The patient would like to attempt adjuvant treatment Given her frail status, recommend single agent carboplatin with paclitaxel and she is in agreement to proceed  Family history of cancer I will refer her to see genetic counselor for genetic testing  Orders Placed This Encounter  Procedures   CBC with Differential ([place] Only)    Standing Status:   Standing    Number of Occurrences:   20    Standing Expiration Date:   92/42/6834   Basic Metabolic Panel - [place] Only    Standing Status:   Standing    Number of Occurrences:   20    Standing Expiration Date:   10/03/2022    The total time spent in the appointment was 60 minutes encounter with patients including review of chart and various tests results, discussions about plan of care and coordination of care plan   All questions were answered. The patient knows to call the clinic with any problems, questions or concerns. No barriers to learning was detected.  Heath Lark, MD 10/20/20224:00 PM  CHIEF COMPLAINTS/PURPOSE OF CONSULTATION:  Urine cancer, for adjuvant treatment  HISTORY OF PRESENTING ILLNESS:  Sophia Santos 87 y.o. female is here because of recent diagnosis of uterine cancer The interpreter is present throughout the visit The patient is tearful because she recently has lost her son from heart attack She has 2 other grandchildren nearby She presented with postmenopausal bleeding for a long time prior to the diagnosis After surgery, she received 2 units of blood transfusion for severe anemia She felt better but still feels quite weak She thinks she is at 30% of her baseline She has strong family history of colon cancer in her mother  His sister also had colon cancer diagnosed around age of 30  I have reviewed her chart and materials related to her cancer extensively and collaborated history with the patient. Summary of oncologic history  is as follows: Oncology History Overview Note  Endometrioid FIGO grade 1-2, MSI high   Endometrial adenocarcinoma ([place])  07/19/2021 Imaging   US pelvis 1. Endometrial thickness of 5.6 mm. In the setting of post-menopausal bleeding, endometrial sampling is indicated to exclude carcinoma.  2. Densely calcified shadowing mass within the right uterus presumably representing a calcified fibroid.   07/31/2021 Pathology Results   SURGICAL PATHOLOGY   FINAL MICROSCOPIC DIAGNOSIS:   A. ENDOMETRIUM, BIOPSY:  - Endometrial adenocarcinoma, FIGO grade 1.  See comment  SUMMARY INTERPRETATION: ABNORMAL   There is loss of the major and minor MMR proteins MLH1 and PMS2. The loss of expression may be secondary to promoter hyper-methylation, gene mutation or other genetic event. BRAF mutation testing and/or MLH1 methylation testing is indicated. The presence of a BRAF mutation and/or MLH1 hypermethylation is indicative of a sporadic-type tumor. The absence of either BRAF mutation and/or presence of normal methylation indicate the possible presence of a hereditary germline mutation (e.g. Lynch syndrome) and referral to genetic counseling is warranted.    08/05/2021 Initial Diagnosis   Endometrial adenocarcinoma ([place])   08/21/2021 Pathology Results   FINAL MICROSCOPIC DIAGNOSIS:   A. LYMPH NODE, SENTINEL, RIGHT OBTURATOR, EXCISION:  - Lymph node, negative for carcinoma (0/1)   B. LYMPH NODE, SENTINEL, LEFT OBTURATOR, EXCISION:  - Lymph node, negative for carcinoma (0/1)   C. UTERUS, CERVIX, BILATERAL FALLOPIAN TUBES AND OVARIES:  - Endometrioid adenocarcinoma, FIGO grade 2  - Carcinoma invades through the entire myometrium and focally involves  the serosal surface  - Benign unremarkable cervix  - Benign bilateral fallopian tubes and ovaries  - See oncology table  - See comment   COMMENT:   C.  Tumor showed areas with clear cell morphology but immunostains for Napsin A and P504S do not show evidence  of clear cell carcinoma.  Also, there is significant tumor necrosis, but the viable portion of tumor appears to be FIGO grade 2.  Dr. Tresa Moore reviewed the select slides from the case and concurs with presence of serosal invasion.    ONCOLOGY TABLE:   UTERUS, CARCINOMA OR CARCINOSARCOMA: Resection   Procedure: Total hysterectomy and bilateral salpingo-oophorectomy  Histologic Type: Endometrioid adenocarcinoma  Histologic Grade: FIGO grade 1  Myometrial Invasion:       Depth of Myometrial Invasion (mm): 17 mm       Myometrial Thickness (mm): 17 mm       Percentage of Myometrial Invasion: 100%  Uterine Serosa Involvement: Present, focal  Cervical stromal Involvement: Not identified  Extent of involvement of other tissue/organs: Not identified  Peritoneal/Ascitic Fluid: Not applicable  Lymphovascular Invasion: Not identified  Regional Lymph Nodes:       Pelvic Lymph Nodes Examined:                                   2 Sentinel                                   0 Non-sentinel  2 Total       Pelvic Lymph Nodes with Metastasis: 0       Para-aortic Lymph Nodes Examined:                                    0 Sentinel                                    0 Non-sentinel                                    0 Total       Para-aortic Lymph Nodes with Metastasis: 0  Distant Metastasis:       Distant Site(s) Involved: Not applicable  Pathologic Stage Classification (pTNM, AJCC 8th Edition): pT3a, pN0  Ancillary Studies: MMR / MSI testing will be ordered  Representative Tumor Block: C10  Comment(s): Pancytokeratin was performed on the lymph nodes and is negative.    08/21/2021 Surgery   Surgeon: Donaciano Eva    Operation: Robotic-assisted laparoscopic total hysterectomy with bilateral salpingoophorectomy, SLN biopsy     Operative Findings:  : 6cm uterus with anterior fibroid (approx 4cm). Normal appearing tubes and ovaries. No suspicious nodes. Bilateral  mapping.     10/03/2021 Cancer Staging   Staging form: Corpus Uteri - Carcinoma and Carcinosarcoma, AJCC 8th Edition - Clinical stage from 10/03/2021: FIGO Stage III (cT3, cN0, cM0) - Signed by Heath Lark, MD on 10/03/2021 Stage prefix: Initial diagnosis   10/14/2021 -  Chemotherapy   Patient is on Treatment Plan : Uterine Carboplatin (AUC 5) 21d       MEDICAL HISTORY:  Past Medical History:  Diagnosis Date   Arthritis    Complication of anesthesia    "years ago, hard to awaken, no with recent procedures."   Diverticulosis    Endometrial cancer ([place])    Gallstones    GERD (gastroesophageal reflux disease)    Headache 08/15/2021   none recent   HTN (hypertension)    Hyperlipidemia    Irritable bowel syndrome (IBS)    Osteoarthritis    Osteopenia    Osteoporosis    PMB (postmenopausal bleeding) 08/15/2021   Skin cancer    skin cancer -    Urinary frequency 08/15/2021   Wears glasses 08/15/2021    SURGICAL HISTORY: Past Surgical History:  Procedure Laterality Date   bilateral cataracts     COLONOSCOPY     INGUINAL HERNIA REPAIR Bilateral 09/13/2020   Procedure: LAPAROSCOPIC BILATERAL INGUINAL HERNIA REPAIR WITH MESH;  Surgeon: Ralene Ok, MD;  Location: [facility];  Service: General;  Laterality: Bilateral;   ROBOTIC ASSISTED TOTAL HYSTERECTOMY WITH BILATERAL SALPINGO OOPHERECTOMY Bilateral 08/21/2021   Procedure: XI ROBOTIC ASSISTED TOTAL HYSTERECTOMY WITH BILATERAL SALPINGO OOPHORECTOMY;  Surgeon: Everitt Amber, MD;  Location: [facility];  Service: Gynecology;  Laterality: Bilateral;   SENTINEL NODE BIOPSY N/A 08/21/2021   Procedure: SENTINEL NODE BIOPSY;  Surgeon: Everitt Amber, MD;  Location: [facility];  Service: Gynecology;  Laterality: N/A;   uterine prolapse surgery      SOCIAL HISTORY: Social History   Socioeconomic History   Marital status: Widowed    Spouse name: Not on file   Number of children: 1   Years of education: Not  on file   Highest education level: Not on file  Occupational History   Occupation: retired  Tobacco Use   Smoking status: Never   Smokeless tobacco: Never  Vaping Use   Vaping Use: Never used  Substance and Sexual Activity   Alcohol use: No   Drug use: No   Sexual activity: Not Currently    Birth control/protection: None  Other Topics Concern   Not on file  Social History Narrative   One Boy    Daily Caffeine - tea         Social Determinants of Health   Financial Resource Strain: Not on file  Food Insecurity: Not on file  Transportation Needs: Not on file  Physical Activity: Not on file  Stress: Not on file  Social Connections: Not on file  Intimate Partner Violence: Not on file    FAMILY HISTORY: Family History  Problem Relation Age of Onset   Colon cancer Mother 36       colorectal   Stroke Mother    Parkinson's disease Father    Dementia Father    Colon cancer Sister        colorectal   Colon polyps Sister    Coronary artery disease Other        female 1st degree <50   Esophageal cancer Neg Hx    Stomach cancer Neg Hx    Anesthesia problems Neg Hx    Breast cancer Neg Hx    Endometrial cancer Neg Hx    Pancreatic cancer Neg Hx    Prostate cancer Neg Hx     ALLERGIES:  is allergic to fosamax [alendronate sodium], lipitor [atorvastatin], voltaren [diclofenac sodium], and zocor [simvastatin].  MEDICATIONS:  Current Outpatient Medications  Medication Sig Dispense Refill   acetaminophen (TYLENOL) 325 MG tablet Take 352 mg by mouth every 6 (six) hours as needed for mild pain or moderate pain.     ALPRAZolam (XANAX) 1 MG tablet Take 0.5-1 tablets (0.5-1 mg total) by mouth at bedtime as needed for anxiety or sleep. 90 tablet 1   amLODipine (NORVASC) 5 MG tablet Take 1 tablet (5 mg total) by mouth daily. 90 tablet 3   captopril (CAPOTEN) 50 MG tablet TAKE 1 TABLET BY MOUTH TWICE A DAY 180 tablet 3   cephALEXin (KEFLEX) 500 MG capsule Take 1 capsule (500 mg  total) by mouth 3 (three) times daily for 10 days. 30 capsule 0   Cholecalciferol 1000 UNITS tablet Take 1,000 Units by mouth daily. Vitamin d     Coenzyme Q10 (CO Q-10) 100 MG CAPS Take by mouth daily. (Patient not taking: Reported on 10/02/2021)     ezetimibe (ZETIA) 10 MG tablet TAKE 1 TABLET BY MOUTH EVERY DAY 90 tablet 3   MYRBETRIQ 25 MG TB24 tablet Take 1 tablet (25 mg total) by mouth daily. (Patient taking differently: Take 25 mg by mouth at bedtime.) 90 tablet 3   omeprazole (PRILOSEC) 20 MG capsule Take 2 capsules (40 mg total) by mouth daily. Annual appt due in March must see provider for future refills (Patient taking differently: Take 20 mg by mouth daily. Prn at hs also) 180 capsule 3   ondansetron (ZOFRAN) 8 MG tablet Take 1 tablet (8 mg total) by mouth 2 (two) times daily as needed for refractory nausea / vomiting. Start on day 3 after carboplatin chemo. 30 tablet 1   Pancrelipase, Lip-Prot-Amyl, (CREON) 24000-76000 units CPEP TAKE ONE CAPSULE BY MOUTH 3 TIMES A DAY WITH FOOD (Patient  taking differently: as needed. TAKE ONE CAPSULE BY MOUTH 3 TIMES A DAY WITH FOOD) 270 capsule 3   polyethylene glycol powder (GAVILAX) 17 GM/SCOOP powder MIX 1 CAPFUL (17 GRAMS) AS DIRECTED AND DRINK TWICE A DAY AS NEEDED FOR MODERATE CONSTIPATION 510 g 5   prochlorperazine (COMPAZINE) 10 MG tablet Take 1 tablet (10 mg total) by mouth every 6 (six) hours as needed (Nausea or vomiting). 30 tablet 1   propranolol ER (INDERAL LA) 120 MG 24 hr capsule Take 1 capsule (120 mg total) by mouth daily. 90 capsule 3   RESTASIS 0.05 % ophthalmic emulsion Place 1 drop into both eyes 2 (two) times daily.      senna-docusate (SENOKOT-S) 8.6-50 MG tablet Take 2 tablets by mouth at bedtime. For AFTER surgery, do not take if having diarrhea 30 tablet 0   Vitamin A 2400 MCG (8000 UT) CAPS Take 2,400 mcg by mouth daily.     No current facility-administered medications for this visit.    REVIEW OF SYSTEMS:    Constitutional: Denies fevers, chills or abnormal night sweats Eyes: Denies blurriness of vision, double vision or watery eyes Ears, nose, mouth, throat, and face: Denies mucositis or sore throat Respiratory: Denies cough, dyspnea or wheezes Cardiovascular: Denies palpitation, chest discomfort or lower extremity swelling Gastrointestinal:  Denies nausea, heartburn or change in bowel habits Skin: Denies abnormal skin rashes Lymphatics: Denies new lymphadenopathy or easy bruising Neurological:Denies numbness, tingling or new weaknesses Behavioral/Psych: Mood is stable, no new changes  All other systems were reviewed with the patient and are negative.  PHYSICAL EXAMINATION: ECOG PERFORMANCE STATUS: 2 - Symptomatic, <50% confined to bed  Vitals:   10/03/21 1313  BP: (!) 157/81  Pulse: 67  Resp: 18  Temp: 97.7 F (36.5 C)  SpO2: 100%   Filed Weights   10/03/21 1313  Weight: 132 lb 6.4 oz (60.1 kg)    GENERAL:alert, no distress and comfortable SKIN: skin color, texture, turgor are normal, no rashes or significant lesions EYES: normal, conjunctiva are pink and non-injected, sclera clear OROPHARYNX:no exudate, no erythema and lips, buccal mucosa, and tongue normal  NECK: supple, thyroid normal size, non-tender, without nodularity LYMPH:  no palpable lymphadenopathy in the cervical, axillary or inguinal LUNGS: clear to auscultation and percussion with normal breathing effort HEART: regular rate & rhythm and no murmurs and no lower extremity edema ABDOMEN:abdomen soft, non-tender and normal bowel sounds.  Noted well-healed surgical scar Musculoskeletal:no cyanosis of digits and no clubbing  PSYCH: alert & oriented x 3 with fluent speech NEURO: no focal motor/sensory deficits  LABORATORY DATA:  I have reviewed the data as listed Lab Results  Component Value Date   WBC 5.5 10/03/2021   HGB 11.4 (L) 10/03/2021   HCT 35.1 (L) 10/03/2021   MCV 96.7 10/03/2021   PLT 251 10/03/2021    Recent Labs    05/06/21 1106 08/16/21 1331 08/21/21 0832 09/30/21 0922 10/03/21 1236  NA 138 132* 137  --  135  K 4.6 4.1 3.8  --  4.0  CL 104 102 104  --  99  CO2 28 25  --   --  25  GLUCOSE 97 120* 91  --  111*  BUN 18 27* 19  --  21  CREATININE 0.72 0.72 0.70 0.60 0.62  CALCIUM 9.0 8.8*  --   --  9.3  GFRNONAA  --  >60  --   --  >60  PROT 7.0 6.7  --   --  7.0  ALBUMIN 4.0 3.6  --   --  3.9  AST 17 29  --   --  23  ALT 15 31  --   --  30  ALKPHOS 75 116  --   --  139*  BILITOT 0.5 0.7  --   --  0.6    RADIOGRAPHIC STUDIES: I have personally reviewed the radiological images as listed and agreed with the findings in the report. CT Abdomen Pelvis W Contrast  Result Date: 10/01/2021 CLINICAL DATA:  Endometrial cancer, value 8 for metastatic disease. EXAM: CT ABDOMEN AND PELVIS WITH CONTRAST TECHNIQUE: Multidetector CT imaging of the abdomen and pelvis was performed using the standard protocol following bolus administration of intravenous contrast. CONTRAST:  8m OMNIPAQUE IOHEXOL 350 MG/ML SOLN COMPARISON:  09/30/2016. FINDINGS: Lower chest: Scattered scarring in the lung bases. Heart is at the upper limits of normal in size. No pericardial or pleural effusion. Distal esophagus is grossly unremarkable. Hepatobiliary: Liver is slightly decreased in attenuation diffusely. Liver and gallbladder are otherwise unremarkable. No biliary ductal dilatation. Pancreas: Negative. Spleen: Negative. Adrenals/Urinary Tract: Right adrenal gland is unremarkable. 1.6 cm nodule in the medial limb left adrenal gland is unchanged from 09/30/2016. Right kidney is unremarkable. Subcentimeter low-attenuation lesions in the left kidney are too small to characterize but statistically, cysts are likely. Ureters are decompressed. Bladder is grossly unremarkable. Stomach/Bowel: Tiny hiatal hernia. Stomach is decompressed. Stomach, small bowel, appendix and colon are otherwise unremarkable. Vascular/Lymphatic:  Atherosclerotic calcification of the aorta. No pathologically enlarged lymph nodes. Reproductive: Hysterectomy.  No adnexal mass. Other: Pelvic ventral hernia repair. No free fluid. Mesenteries and peritoneum are unremarkable. Musculoskeletal: Degenerative changes in the spine. No worrisome lytic or sclerotic lesions. IMPRESSION: 1. No evidence of metastatic disease. 2. Hepatic steatosis. 3. Left adrenal adenoma. 4.  Aortic atherosclerosis (ICD10-I70.0). Electronically Signed   By: MLorin PicketM.D.   On: 10/01/2021 11:08

## 2021-10-03 NOTE — Assessment & Plan Note (Signed)
I am concerned about her wellbeing She is weak and frail with surgery Even though the current guidelines support adjuvant chemotherapy with carboplatin and paclitaxel, and doubtful that she can tolerate combination chemotherapy For her, I would recommend single agent carboplatin  We reviewed the NCCN guidelines We discussed the role of chemotherapy. The intent is of curative intent.  We discussed some of the risks, benefits, side-effects of carboplatin. Treatment is intravenous, every 3 weeks x 6 cycles  Some of the short term side-effects included, though not limited to, including weight loss, life threatening infections, risk of allergic reactions, need for transfusions of blood products, nausea, vomiting, change in bowel habits, admission to hospital for various reasons, and risks of death.  The patient is aware that the response rates discussed earlier is not guaranteed.  After a long discussion, patient made an informed decision to proceed with the prescribed plan of care.   Patient education material was dispensed. She does not need port placement We will schedule chemo education class I recommend we wait another 2 weeks to allow recovery from recent surgery before we start adjuvant treatment She is in agreement to proceed

## 2021-10-03 NOTE — Assessment & Plan Note (Signed)
We had extensive discussions about goals of care The patient would like to attempt adjuvant treatment Given her frail status, recommend single agent carboplatin with paclitaxel and she is in agreement to proceed

## 2021-10-04 ENCOUNTER — Encounter: Payer: Self-pay | Admitting: *Deleted

## 2021-10-04 LAB — IRON AND TIBC
Iron: 50 ug/dL (ref 28–170)
Saturation Ratios: 17 % (ref 10.4–31.8)
TIBC: 286 ug/dL (ref 250–450)
UIBC: 236 ug/dL

## 2021-10-04 LAB — FERRITIN: Ferritin: 130 ng/mL (ref 11–307)

## 2021-10-04 NOTE — Progress Notes (Signed)
[facility] Psychosocial Distress Screening Clinical Social Work  Clinical Social Work was referred by distress screening protocol.  The patient scored a 10 on the Psychosocial Distress Thermometer which indicates severe distress. Due to interpreter required, Clinical Social Worker  scheduled appointment to meet with patient during first infusion  to assess for distress and other psychosocial needs.   ONCBCN DISTRESS SCREENING 10/02/2021  Screening Type Initial Screening  Distress experienced in past week (1-10) 10  Family Problem type Children  Emotional problem type Depression;Adjusting to illness  Information Concerns Type Lack of info about diagnosis;Lack of info about treatment;Lack of info about complementary therapy choices  Physical Problem type Constipation/diarrhea;Sleep/insomnia;Changes in urination;Swollen arms/legs  Referral to clinical social work Yes    Clinical Social Worker follow up needed: Yes.    If yes, follow up plan: see in infusion  Gwinda Maine, LCSW

## 2021-10-07 NOTE — Progress Notes (Signed)
Pharmacist Chemotherapy Monitoring - Initial Assessment    Anticipated start date: 10/14/21   The following has been reviewed per standard work regarding the patient's treatment regimen: The patient's diagnosis, treatment plan and drug doses, and organ/hematologic function Lab orders and baseline tests specific to treatment regimen  The treatment plan start date, drug sequencing, and pre-medications Prior authorization status  Patient's documented medication list, including drug-drug interaction screen and prescriptions for anti-emetics and supportive care specific to the treatment regimen The drug concentrations, fluid compatibility, administration routes, and timing of the medications to be used The patient's access for treatment and lifetime cumulative dose history, if applicable  The patient's medication allergies and previous infusion related reactions, if applicable   Changes made to treatment plan:  N/A  Follow up needed:  Pending authorization for treatment   Benn Moulder, PharmD Pharmacy Resident  10/07/2021 9:47 AM

## 2021-10-11 ENCOUNTER — Telehealth: Payer: Self-pay | Admitting: Oncology

## 2021-10-11 MED FILL — Fosaprepitant Dimeglumine For IV Infusion 150 MG (Base Eq): INTRAVENOUS | Qty: 5 | Status: AC

## 2021-10-11 MED FILL — Dexamethasone Sodium Phosphate Inj 100 MG/10ML: INTRAMUSCULAR | Qty: 1 | Status: AC

## 2021-10-11 NOTE — Telephone Encounter (Signed)
Called Sophia Santos with the assistance of [employer] and went over her appointments for Monday including genetic counseling.  She verbalized understanding and agreement.

## 2021-10-14 ENCOUNTER — Inpatient Hospital Stay (HOSPITAL_BASED_OUTPATIENT_CLINIC_OR_DEPARTMENT_OTHER): Payer: Medicare Other | Admitting: Genetic Counselor

## 2021-10-14 ENCOUNTER — Inpatient Hospital Stay: Payer: Medicare Other

## 2021-10-14 ENCOUNTER — Other Ambulatory Visit: Payer: Self-pay

## 2021-10-14 ENCOUNTER — Inpatient Hospital Stay (HOSPITAL_BASED_OUTPATIENT_CLINIC_OR_DEPARTMENT_OTHER): Payer: Medicare Other | Admitting: Hematology and Oncology

## 2021-10-14 ENCOUNTER — Encounter: Payer: Self-pay | Admitting: Hematology and Oncology

## 2021-10-14 ENCOUNTER — Inpatient Hospital Stay: Payer: Medicare Other | Admitting: *Deleted

## 2021-10-14 DIAGNOSIS — C541 Malignant neoplasm of endometrium: Secondary | ICD-10-CM

## 2021-10-14 DIAGNOSIS — D539 Nutritional anemia, unspecified: Secondary | ICD-10-CM

## 2021-10-14 DIAGNOSIS — Z809 Family history of malignant neoplasm, unspecified: Secondary | ICD-10-CM

## 2021-10-14 DIAGNOSIS — I10 Essential (primary) hypertension: Secondary | ICD-10-CM | POA: Diagnosis not present

## 2021-10-14 DIAGNOSIS — Z808 Family history of malignant neoplasm of other organs or systems: Secondary | ICD-10-CM | POA: Diagnosis not present

## 2021-10-14 DIAGNOSIS — Z1379 Encounter for other screening for genetic and chromosomal anomalies: Secondary | ICD-10-CM

## 2021-10-14 DIAGNOSIS — Z800 Family history of malignant neoplasm of digestive organs: Secondary | ICD-10-CM

## 2021-10-14 LAB — BASIC METABOLIC PANEL - CANCER CENTER ONLY
Anion gap: 7 (ref 5–15)
BUN: 16 mg/dL (ref 8–23)
CO2: 26 mmol/L (ref 22–32)
Calcium: 8.8 mg/dL — ABNORMAL LOW (ref 8.9–10.3)
Chloride: 104 mmol/L (ref 98–111)
Creatinine: 0.74 mg/dL (ref 0.44–1.00)
GFR, Estimated: >60 mL/min (ref >60)
Glucose, Bld: 117 mg/dL — ABNORMAL HIGH (ref 70–99)
Potassium: 4 mmol/L (ref 3.5–5.1)
Sodium: 137 mmol/L (ref 135–145)

## 2021-10-14 LAB — CBC WITH DIFFERENTIAL (CANCER CENTER ONLY)
Abs Immature Granulocytes: 0 10*3/uL (ref 0.00–0.07)
Basophils Absolute: 0.1 10*3/uL (ref 0.0–0.1)
Basophils Relative: 1 %
Eosinophils Absolute: 0.1 10*3/uL (ref 0.0–0.5)
Eosinophils Relative: 3 %
HCT: 34.3 % — ABNORMAL LOW (ref 36.0–46.0)
Hemoglobin: 11.5 g/dL — ABNORMAL LOW (ref 12.0–15.0)
Immature Granulocytes: 0 %
Lymphocytes Relative: 38 %
Lymphs Abs: 1.6 10*3/uL (ref 0.7–4.0)
MCH: 31.5 pg (ref 26.0–34.0)
MCHC: 33.5 g/dL (ref 30.0–36.0)
MCV: 94 fL (ref 80.0–100.0)
Monocytes Absolute: 0.4 10*3/uL (ref 0.1–1.0)
Monocytes Relative: 10 %
Neutro Abs: 2.1 10*3/uL (ref 1.7–7.7)
Neutrophils Relative %: 48 %
Platelet Count: 266 10*3/uL (ref 150–400)
RBC: 3.65 MIL/uL — ABNORMAL LOW (ref 3.87–5.11)
RDW: 13.2 % (ref 11.5–15.5)
WBC Count: 4.3 10*3/uL (ref 4.0–10.5)
nRBC: 0 % (ref 0.0–0.2)

## 2021-10-14 MED ORDER — SODIUM CHLORIDE 0.9 % IV SOLN: Freq: Once | INTRAVENOUS | Status: AC

## 2021-10-14 MED ORDER — SODIUM CHLORIDE 0.9 % IV SOLN
150.0000 mg | Freq: Once | INTRAVENOUS | Status: AC
  Administered 2021-10-14: 150 mg via INTRAVENOUS
  Filled 2021-10-14: qty 150

## 2021-10-14 MED ORDER — PALONOSETRON HCL INJECTION 0.25 MG/5ML
0.2500 mg | Freq: Once | INTRAVENOUS | Status: AC
  Administered 2021-10-14: 0.25 mg via INTRAVENOUS
  Filled 2021-10-14: qty 5

## 2021-10-14 MED ORDER — SODIUM CHLORIDE 0.9 % IV SOLN
10.0000 mg | Freq: Once | INTRAVENOUS | Status: AC
  Administered 2021-10-14: 10 mg via INTRAVENOUS
  Filled 2021-10-14: qty 10

## 2021-10-14 MED ORDER — SODIUM CHLORIDE 0.9 % IV SOLN
320.0000 mg | Freq: Once | INTRAVENOUS | Status: AC
  Administered 2021-10-14: 320 mg via INTRAVENOUS
  Filled 2021-10-14: qty 32

## 2021-10-14 NOTE — Assessment & Plan Note (Signed)
She is scheduled to see genetic counselor and the patient agree to proceed

## 2021-10-14 NOTE — Patient Instructions (Signed)
Sophia Santos  Discharge Instructions: Thank you for choosing [facility] to provide your Santos and hematology care.   If you have a lab appointment with the [facility], please go directly to the [facility] and check in at the registration area.   Wear comfortable clothing and clothing appropriate for easy access to any Portacath or PICC line.   We strive to give you quality time with your provider. You may need to reschedule your appointment if you arrive late (15 or more minutes).  Arriving late affects you and other patients whose appointments are after yours.  Also, if you miss three or more appointments without notifying the office, you may be dismissed from the clinic at the provider's discretion.      For prescription refill requests, have your pharmacy contact our office and allow 72 hours for refills to be completed.    Today you received the following chemotherapy and/or immunotherapy agents: Carboplatin     To help prevent nausea and vomiting after your treatment, we encourage you to take your nausea medication as directed.  BELOW ARE SYMPTOMS THAT SHOULD BE REPORTED IMMEDIATELY: *FEVER GREATER THAN 100.4 F (38 C) OR HIGHER *CHILLS OR SWEATING *NAUSEA AND VOMITING THAT IS NOT CONTROLLED WITH YOUR NAUSEA MEDICATION *UNUSUAL SHORTNESS OF BREATH *UNUSUAL BRUISING OR BLEEDING *URINARY PROBLEMS (pain or burning when urinating, or frequent urination) *BOWEL PROBLEMS (unusual diarrhea, constipation, pain near the anus) TENDERNESS IN MOUTH AND THROAT WITH OR WITHOUT PRESENCE OF ULCERS (sore throat, sores in mouth, or a toothache) UNUSUAL RASH, SWELLING OR PAIN  UNUSUAL VAGINAL DISCHARGE OR ITCHING   Items with * indicate a potential emergency and should be followed up as soon as possible or go to the Emergency Department if any problems should occur.  Please show the CHEMOTHERAPY ALERT CARD or IMMUNOTHERAPY ALERT CARD at check-in to  the Emergency Department and triage nurse.  Should you have questions after your visit or need to cancel or reschedule your appointment, please contact [facility]  Dept: [phone]  and follow the prompts.  Office hours are 8:00 a.m. to 4:30 p.m. Monday - Friday. Please note that voicemails left after 4:00 p.m. may not be returned until the following business day.  We are closed weekends and major holidays. You have access to a nurse at all times for urgent questions. Please call the main number to the clinic Dept: [phone] and follow the prompts.   For any non-urgent questions, you may also contact your provider using MyChart. We now offer e-Visits for anyone 47 and older to request care online for non-urgent symptoms. For details visit [email or web address].   Also download the MyChart app! Go to the app store, search "MyChart", open the app, select [facility], and log in with your MyChart username and password.  Due to Covid, a mask is required upon entering the hospital/clinic. If you do not have a mask, one will be given to you upon arrival. For doctor visits, patients may have 1 support person aged 73 or older with them. For treatment visits, patients cannot have anyone with them due to current Covid guidelines and our immunocompromised population.   Carboplatin injection What is this medication? CARBOPLATIN (KAR boe pla tin) is a chemotherapy drug. It targets fast dividing cells, like cancer cells, and causes these cells to die. This medicine is used to treat ovarian cancer and many other cancers. This medicine may be used for other purposes; ask  your health care provider or pharmacist if you have questions. COMMON BRAND NAME(S): Paraplatin What should I tell my care team before I take this medication? They need to know if you have any of these conditions: blood disorders hearing problems kidney disease recent or ongoing radiation therapy an  unusual or allergic reaction to carboplatin, cisplatin, other chemotherapy, other medicines, foods, dyes, or preservatives pregnant or trying to get pregnant breast-feeding How should I use this medication? This drug is usually given as an infusion into a vein. It is administered in a hospital or clinic by a specially trained health care professional. Talk to your pediatrician regarding the use of this medicine in children. Special care may be needed. Overdosage: If you think you have taken too much of this medicine contact a poison control center or emergency room at once. NOTE: This medicine is only for you. Do not share this medicine with others. What if I miss a dose? It is important not to miss a dose. Call your doctor or health care professional if you are unable to keep an appointment. What may interact with this medication? medicines for seizures medicines to increase blood counts like filgrastim, pegfilgrastim, sargramostim some antibiotics like amikacin, gentamicin, neomycin, streptomycin, tobramycin vaccines Talk to your doctor or health care professional before taking any of these medicines: acetaminophen aspirin ibuprofen ketoprofen naproxen This list may not describe all possible interactions. Give your health care provider a list of all the medicines, herbs, non-prescription drugs, or dietary supplements you use. Also tell them if you smoke, drink alcohol, or use illegal drugs. Some items may interact with your medicine. What should I watch for while using this medication? Your condition will be monitored carefully while you are receiving this medicine. You will need important blood work done while you are taking this medicine. This drug may make you feel generally unwell. This is not uncommon, as chemotherapy can affect healthy cells as well as cancer cells. Report any side effects. Continue your course of treatment even though you feel ill unless your doctor tells you to  stop. In some cases, you may be given additional medicines to help with side effects. Follow all directions for their use. Call your doctor or health care professional for advice if you get a fever, chills or sore throat, or other symptoms of a cold or flu. Do not treat yourself. This drug decreases your body's ability to fight infections. Try to avoid being around people who are sick. This medicine may increase your risk to bruise or bleed. Call your doctor or health care professional if you notice any unusual bleeding. Be careful brushing and flossing your teeth or using a toothpick because you may get an infection or bleed more easily. If you have any dental work done, tell your dentist you are receiving this medicine. Avoid taking products that contain aspirin, acetaminophen, ibuprofen, naproxen, or ketoprofen unless instructed by your doctor. These medicines may hide a fever. Do not become pregnant while taking this medicine. Women should inform their doctor if they wish to become pregnant or think they might be pregnant. There is a potential for serious side effects to an unborn child. Talk to your health care professional or pharmacist for more information. Do not breast-feed an infant while taking this medicine. What side effects may I notice from receiving this medication? Side effects that you should report to your doctor or health care professional as soon as possible: allergic reactions like skin rash, itching or hives, swelling of  the face, lips, or tongue signs of infection - fever or chills, cough, sore throat, pain or difficulty passing urine signs of decreased platelets or bleeding - bruising, pinpoint red spots on the skin, black, tarry stools, nosebleeds signs of decreased red blood cells - unusually weak or tired, fainting spells, lightheadedness breathing problems changes in hearing changes in vision chest pain high blood pressure low blood counts - This drug may decrease the  number of Sophia blood cells, red blood cells and platelets. You may be at increased risk for infections and bleeding. nausea and vomiting pain, swelling, redness or irritation at the injection site pain, tingling, numbness in the hands or feet problems with balance, talking, walking trouble passing urine or change in the amount of urine Side effects that usually do not require medical attention (report to your doctor or health care professional if they continue or are bothersome): hair loss loss of appetite metallic taste in the mouth or changes in taste This list may not describe all possible side effects. Call your doctor for medical advice about side effects. You may report side effects to FDA at 1-800-FDA-1088. Where should I keep my medication? This drug is given in a hospital or clinic and will not be stored at home. NOTE: This sheet is a summary. It may not cover all possible information. If you have questions about this medicine, talk to your doctor, pharmacist, or health care provider.  2022 Elsevier/Gold Standard (2008-03-07 14:38:05)

## 2021-10-14 NOTE — Assessment & Plan Note (Signed)
Overall, she looks a bit stronger compared to her last visit We will proceed with adjuvant chemotherapy with single agent carboplatin only She will also see social worker today and genetic counselor I will see her again prior to cycle 2 of treatment

## 2021-10-14 NOTE — Progress Notes (Signed)
Met with patient and interpreter to introduce myself as Arboriculturist and to offer available resources.  Discussed one-time $1000 [employer] grant to assist with personal expenses while going through treatment.  Gave her my card if interested in applying and for any additional financial questions or concerns.

## 2021-10-14 NOTE — Progress Notes (Signed)
[facility] OFFICE PROGRESS NOTE  Patient Care Team: Plotnikov, Evie Lacks, MD as PCP - General Azucena Fallen, MD (Obstetrics and Gynecology) Ralene Ok, MD as Consulting Physician (General Surgery) Milus Banister, MD as Attending Physician (Gastroenterology) Awanda Mink Craige Cotta, RN as Oncology Nurse Navigator (Oncology)  ASSESSMENT & PLAN:  Endometrial adenocarcinoma [facility]) Overall, she looks a bit stronger compared to her last visit We will proceed with adjuvant chemotherapy with single agent carboplatin only She will also see social worker today and genetic counselor I will see her again prior to cycle 2 of treatment  Deficiency anemia She has multifactorial anemia, related to severe postmenopausal bleeding and recent surgical blood loss Her blood count has improved dramatically after transfusion support and time away from surgery I recommend close observation for now  Family history of cancer She is scheduled to see genetic counselor and the patient agree to proceed  No orders of the defined types were placed in this encounter.   All questions were answered. The patient knows to call the clinic with any problems, questions or concerns. The total time spent in the appointment was 20 minutes encounter with patients including review of chart and various tests results, discussions about plan of care and coordination of care plan   Heath Lark, MD 10/14/2021 1:25 PM  INTERVAL HISTORY: Please see below for problem oriented charting. she returns for treatment follow-up seen prior to cycle 1 of carboplatin only for uterine cancer She is here accompanied by the interpreter She is doing well since last time I saw her Has started to gain some weight Denies significant pain from her surgical incision scar No recent constipation  REVIEW OF SYSTEMS:   Constitutional: Denies fevers, chills or abnormal weight loss Eyes: Denies blurriness of vision Ears, nose, mouth,  throat, and face: Denies mucositis or sore throat Respiratory: Denies cough, dyspnea or wheezes Cardiovascular: Denies palpitation, chest discomfort or lower extremity swelling Gastrointestinal:  Denies nausea, heartburn or change in bowel habits Skin: Denies abnormal skin rashes Lymphatics: Denies new lymphadenopathy or easy bruising Neurological:Denies numbness, tingling or new weaknesses Behavioral/Psych: Mood is stable, no new changes  All other systems were reviewed with the patient and are negative.  I have reviewed the past medical history, past surgical history, social history and family history with the patient and they are unchanged from previous note.  ALLERGIES:  is allergic to fosamax [alendronate sodium], lipitor [atorvastatin], voltaren [diclofenac sodium], and zocor [simvastatin].  MEDICATIONS:  Current Outpatient Medications  Medication Sig Dispense Refill   acetaminophen (TYLENOL) 325 MG tablet Take 352 mg by mouth every 6 (six) hours as needed for mild pain or moderate pain.     ALPRAZolam (XANAX) 1 MG tablet Take 0.5-1 tablets (0.5-1 mg total) by mouth at bedtime as needed for anxiety or sleep. 90 tablet 1   amLODipine (NORVASC) 5 MG tablet Take 1 tablet (5 mg total) by mouth daily. 90 tablet 3   captopril (CAPOTEN) 50 MG tablet TAKE 1 TABLET BY MOUTH TWICE A DAY 180 tablet 3   Cholecalciferol 1000 UNITS tablet Take 1,000 Units by mouth daily. Vitamin d     Coenzyme Q10 (CO Q-10) 100 MG CAPS Take by mouth daily. (Patient not taking: Reported on 10/02/2021)     ezetimibe (ZETIA) 10 MG tablet TAKE 1 TABLET BY MOUTH EVERY DAY 90 tablet 3   MYRBETRIQ 25 MG TB24 tablet Take 1 tablet (25 mg total) by mouth daily. (Patient taking differently: Take 25 mg by mouth  at bedtime.) 90 tablet 3   omeprazole (PRILOSEC) 20 MG capsule Take 2 capsules (40 mg total) by mouth daily. Annual appt due in March must see provider for future refills (Patient taking differently: Take 20 mg by mouth  daily. Prn at hs also) 180 capsule 3   ondansetron (ZOFRAN) 8 MG tablet Take 1 tablet (8 mg total) by mouth 2 (two) times daily as needed for refractory nausea / vomiting. Start on day 3 after carboplatin chemo. 30 tablet 1   Pancrelipase, Lip-Prot-Amyl, (CREON) 24000-76000 units CPEP TAKE ONE CAPSULE BY MOUTH 3 TIMES A DAY WITH FOOD (Patient taking differently: as needed. TAKE ONE CAPSULE BY MOUTH 3 TIMES A DAY WITH FOOD) 270 capsule 3   polyethylene glycol powder (GAVILAX) 17 GM/SCOOP powder MIX 1 CAPFUL (17 GRAMS) AS DIRECTED AND DRINK TWICE A DAY AS NEEDED FOR MODERATE CONSTIPATION 510 g 5   prochlorperazine (COMPAZINE) 10 MG tablet Take 1 tablet (10 mg total) by mouth every 6 (six) hours as needed (Nausea or vomiting). 30 tablet 1   propranolol ER (INDERAL LA) 120 MG 24 hr capsule Take 1 capsule (120 mg total) by mouth daily. 90 capsule 3   RESTASIS 0.05 % ophthalmic emulsion Place 1 drop into both eyes 2 (two) times daily.      Vitamin A 2400 MCG (8000 UT) CAPS Take 2,400 mcg by mouth daily.     No current facility-administered medications for this visit.    SUMMARY OF ONCOLOGIC HISTORY: Oncology History Overview Note  Endometrioid FIGO grade 1-2, MSI high   Endometrial adenocarcinoma ([facility])  07/19/2021 Imaging   US pelvis 1. Endometrial thickness of 5.6 mm. In the setting of post-menopausal bleeding, endometrial sampling is indicated to exclude carcinoma.  2. Densely calcified shadowing mass within the right uterus presumably representing a calcified fibroid.   07/31/2021 Pathology Results   SURGICAL PATHOLOGY   FINAL MICROSCOPIC DIAGNOSIS:   A. ENDOMETRIUM, BIOPSY:  - Endometrial adenocarcinoma, FIGO grade 1.  See comment  SUMMARY INTERPRETATION: ABNORMAL   There is loss of the major and minor MMR proteins MLH1 and PMS2. The loss of expression may be secondary to promoter hyper-methylation, gene mutation or other genetic event. BRAF mutation testing and/or MLH1 methylation testing  is indicated. The presence of a BRAF mutation and/or MLH1 hypermethylation is indicative of a sporadic-type tumor. The absence of either BRAF mutation and/or presence of normal methylation indicate the possible presence of a hereditary germline mutation (e.g. Lynch syndrome) and referral to genetic counseling is warranted.    08/05/2021 Initial Diagnosis   Endometrial adenocarcinoma ([facility])   08/21/2021 Pathology Results   FINAL MICROSCOPIC DIAGNOSIS:   A. LYMPH NODE, SENTINEL, RIGHT OBTURATOR, EXCISION:  - Lymph node, negative for carcinoma (0/1)   B. LYMPH NODE, SENTINEL, LEFT OBTURATOR, EXCISION:  - Lymph node, negative for carcinoma (0/1)   C. UTERUS, CERVIX, BILATERAL FALLOPIAN TUBES AND OVARIES:  - Endometrioid adenocarcinoma, FIGO grade 2  - Carcinoma invades through the entire myometrium and focally involves  the serosal surface  - Benign unremarkable cervix  - Benign bilateral fallopian tubes and ovaries  - See oncology table  - See comment   COMMENT:   C.  Tumor showed areas with clear cell morphology but immunostains for Napsin A and P504S do not show evidence of clear cell carcinoma.  Also, there is significant tumor necrosis, but the viable portion of tumor appears to be FIGO grade 2.  Dr. Tresa Moore reviewed the select slides from the case and concurs  with presence of serosal invasion.    ONCOLOGY TABLE:   UTERUS, CARCINOMA OR CARCINOSARCOMA: Resection   Procedure: Total hysterectomy and bilateral salpingo-oophorectomy  Histologic Type: Endometrioid adenocarcinoma  Histologic Grade: FIGO grade 1  Myometrial Invasion:       Depth of Myometrial Invasion (mm): 17 mm       Myometrial Thickness (mm): 17 mm       Percentage of Myometrial Invasion: 100%  Uterine Serosa Involvement: Present, focal  Cervical stromal Involvement: Not identified  Extent of involvement of other tissue/organs: Not identified  Peritoneal/Ascitic Fluid: Not applicable  Lymphovascular Invasion: Not  identified  Regional Lymph Nodes:       Pelvic Lymph Nodes Examined:                                   2 Sentinel                                   0 Non-sentinel                                   2 Total       Pelvic Lymph Nodes with Metastasis: 0       Para-aortic Lymph Nodes Examined:                                    0 Sentinel                                    0 Non-sentinel                                    0 Total       Para-aortic Lymph Nodes with Metastasis: 0  Distant Metastasis:       Distant Site(s) Involved: Not applicable  Pathologic Stage Classification (pTNM, AJCC 8th Edition): pT3a, pN0  Ancillary Studies: MMR / MSI testing will be ordered  Representative Tumor Block: C10  Comment(s): Pancytokeratin was performed on the lymph nodes and is negative.    08/21/2021 Surgery   Surgeon: Donaciano Eva    Operation: Robotic-assisted laparoscopic total hysterectomy with bilateral salpingoophorectomy, SLN biopsy     Operative Findings:  : 6cm uterus with anterior fibroid (approx 4cm). Normal appearing tubes and ovaries. No suspicious nodes. Bilateral mapping.     10/03/2021 Cancer Staging   Staging form: Corpus Uteri - Carcinoma and Carcinosarcoma, AJCC 8th Edition - Clinical stage from 10/03/2021: FIGO Stage III (cT3, cN0, cM0) - Signed by Heath Lark, MD on 10/03/2021 Stage prefix: Initial diagnosis    10/14/2021 -  Chemotherapy   Patient is on Treatment Plan : Uterine Carboplatin (AUC 5) 21d       PHYSICAL EXAMINATION: ECOG PERFORMANCE STATUS: 1 - Symptomatic but completely ambulatory  Vitals:   10/14/21 1220  BP: (!) 142/75  Pulse: 66  Resp: 18  Temp: (!) 97 F (36.1 C)  SpO2: 100%   Filed Weights   10/14/21 1220  Weight: 133 lb 9.6 oz (60.6 kg)    GENERAL:alert, no  distress and comfortable SKIN: skin color, texture, turgor are normal, no rashes or significant lesions EYES: normal, Conjunctiva are pink and non-injected, sclera  clear OROPHARYNX:no exudate, no erythema and lips, buccal mucosa, and tongue normal  NECK: supple, thyroid normal size, non-tender, without nodularity LYMPH:  no palpable lymphadenopathy in the cervical, axillary or inguinal LUNGS: clear to auscultation and percussion with normal breathing effort HEART: regular rate & rhythm and no murmurs and no lower extremity edema ABDOMEN:abdomen soft, non-tender and normal bowel sounds Musculoskeletal:no cyanosis of digits and no clubbing  NEURO: alert & oriented x 3 with fluent speech, no focal motor/sensory deficits  LABORATORY DATA:  I have reviewed the data as listed    Component Value Date/Time   NA 137 10/14/2021 1149   K 4.0 10/14/2021 1149   CL 104 10/14/2021 1149   CO2 26 10/14/2021 1149   GLUCOSE 117 (H) 10/14/2021 1149   GLUCOSE 96 11/16/2006 1058   BUN 16 10/14/2021 1149   CREATININE 0.74 10/14/2021 1149   CALCIUM 8.8 (L) 10/14/2021 1149   PROT 7.0 10/03/2021 1236   ALBUMIN 3.9 10/03/2021 1236   AST 23 10/03/2021 1236   ALT 30 10/03/2021 1236   ALKPHOS 139 (H) 10/03/2021 1236   BILITOT 0.6 10/03/2021 1236   GFRNONAA >60 10/14/2021 1149   GFRAA >60 09/13/2020 0800    No results found for: SPEP, UPEP  Lab Results  Component Value Date   WBC 4.3 10/14/2021   NEUTROABS 2.1 10/14/2021   HGB 11.5 (L) 10/14/2021   HCT 34.3 (L) 10/14/2021   MCV 94.0 10/14/2021   PLT 266 10/14/2021      Chemistry      Component Value Date/Time   NA 137 10/14/2021 1149   K 4.0 10/14/2021 1149   CL 104 10/14/2021 1149   CO2 26 10/14/2021 1149   BUN 16 10/14/2021 1149   CREATININE 0.74 10/14/2021 1149      Component Value Date/Time   CALCIUM 8.8 (L) 10/14/2021 1149   ALKPHOS 139 (H) 10/03/2021 1236   AST 23 10/03/2021 1236   ALT 30 10/03/2021 1236   BILITOT 0.6 10/03/2021 1236

## 2021-10-14 NOTE — Progress Notes (Signed)
[facility] Psychosocial Distress Screening Clinical Social Work  Clinical Social Work was referred by distress screening protocol.  The patient scored a 10 on the Psychosocial Distress Thermometer which indicates severe distress. Clinical Social Worker met with patient and interpreter before infusion appointment to assess for distress and other psychosocial needs.   Patient shared she is grieving the loss of her son.  She has no questions regarding treatment and finds comfort in talking with her adult grandchildren. Patient's interpreter/friend mentioned limited family support. Ms. Hawes has an aide that comes to assist in her home around 80 hours a month.  CSW briefly discussed CSW role for resource management and emotional support. The patient does not feel she has any needs at this time.  ONCBCN DISTRESS SCREENING 10/02/2021  Screening Type Initial Screening  Distress experienced in past week (1-10) 10  Family Problem type Children  Emotional problem type Depression;Adjusting to illness  Information Concerns Type Lack of info about diagnosis;Lack of info about treatment;Lack of info about complementary therapy choices  Physical Problem type Constipation/diarrhea;Sleep/insomnia;Changes in urination;Swollen arms/legs  Referral to clinical social work Yes    Clinical Social Worker follow up needed: No.  If yes, follow up plan:  Gwinda Maine, LCSW

## 2021-10-14 NOTE — Assessment & Plan Note (Signed)
She has multifactorial anemia, related to severe postmenopausal bleeding and recent surgical blood loss Her blood count has improved dramatically after transfusion support and time away from surgery I recommend close observation for now

## 2021-10-15 ENCOUNTER — Encounter: Payer: Self-pay | Admitting: Hematology and Oncology

## 2021-10-15 ENCOUNTER — Telehealth: Payer: Self-pay | Admitting: Oncology

## 2021-10-15 ENCOUNTER — Encounter: Payer: Self-pay | Admitting: Genetic Counselor

## 2021-10-15 DIAGNOSIS — Z800 Family history of malignant neoplasm of digestive organs: Secondary | ICD-10-CM | POA: Insufficient documentation

## 2021-10-15 DIAGNOSIS — Z1379 Encounter for other screening for genetic and chromosomal anomalies: Secondary | ICD-10-CM | POA: Insufficient documentation

## 2021-10-15 HISTORY — DX: Family history of malignant neoplasm of digestive organs: Z80.0

## 2021-10-15 NOTE — Progress Notes (Signed)
REFERRING PROVIDER: Heath Lark, MD [place],  [place] [address]  PRIMARY PROVIDER:  Plotnikov, Evie Lacks, MD  PRIMARY REASON FOR VISIT:  1. Endometrial adenocarcinoma ([place])   2. Family history of colon cancer   3. Genetic testing     HISTORY OF PRESENT ILLNESS:   Sophia Santos, a 87 y.o. female, was seen for a  cancer genetics consultation at the request of Dr. Alvy Bimler due to a personal and family history of cancer.  Sophia Santos presents to clinic today to discuss the possibility of a hereditary predisposition to cancer, to discuss genetic testing, and to further clarify her future cancer risks, as well as potential cancer risks for family members.   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  Hypermethylation of MLH1 was present. Sophia Santos also has a history of skin cancers, mainly basal cell carcinoma diagnosed after age 67.   CANCER HISTORY:  Oncology History Overview Note  Endometrioid FIGO grade 1-2, MSI high   Endometrial adenocarcinoma ([place])  07/19/2021 Imaging   US pelvis 1. Endometrial thickness of 5.6 mm. In the setting of post-menopausal bleeding, endometrial sampling is indicated to exclude carcinoma.  2. Densely calcified shadowing mass within the right uterus presumably representing a calcified fibroid.   07/31/2021 Pathology Results   SURGICAL PATHOLOGY   FINAL MICROSCOPIC DIAGNOSIS:   A. ENDOMETRIUM, BIOPSY:  - Endometrial adenocarcinoma, FIGO grade 1.  See comment  SUMMARY INTERPRETATION: ABNORMAL   There is loss of the major and minor MMR proteins MLH1 and PMS2. The loss of expression may be secondary to promoter hyper-methylation, gene mutation or other genetic event. BRAF mutation testing and/or MLH1 methylation testing is indicated. The presence of a BRAF mutation and/or MLH1 hypermethylation is indicative of a sporadic-type tumor. The absence of either  BRAF mutation and/or presence of normal methylation indicate the possible presence of a hereditary germline mutation (e.g. Lynch syndrome) and referral to genetic counseling is warranted.    08/05/2021 Initial Diagnosis   Endometrial adenocarcinoma ([place])   08/21/2021 Pathology Results   FINAL MICROSCOPIC DIAGNOSIS:   A. LYMPH NODE, SENTINEL, RIGHT OBTURATOR, EXCISION:  - Lymph node, negative for carcinoma (0/1)   B. LYMPH NODE, SENTINEL, LEFT OBTURATOR, EXCISION:  - Lymph node, negative for carcinoma (0/1)   C. UTERUS, CERVIX, BILATERAL FALLOPIAN TUBES AND OVARIES:  - Endometrioid adenocarcinoma, FIGO grade 2  - Carcinoma invades through the entire myometrium and focally involves  the serosal surface  - Benign unremarkable cervix  - Benign bilateral fallopian tubes and ovaries  - See oncology table  - See comment   COMMENT:   C.  Tumor showed areas with clear cell morphology but immunostains for Napsin A and P504S do not show evidence of clear cell carcinoma.  Also, there is significant tumor necrosis, but the viable portion of tumor appears to be FIGO grade 2.  Dr. Tresa Moore reviewed the select slides from the case and concurs with presence of serosal invasion.    ONCOLOGY TABLE:   UTERUS, CARCINOMA OR CARCINOSARCOMA: Resection   Procedure: Total hysterectomy and bilateral salpingo-oophorectomy  Histologic Type: Endometrioid adenocarcinoma  Histologic Grade: FIGO grade 1  Myometrial Invasion:       Depth of Myometrial Invasion (mm): 17 mm       Myometrial Thickness (mm): 17 mm       Percentage of Myometrial Invasion: 100%  Uterine Serosa Involvement: Present,  focal  Cervical stromal Involvement: Not identified  Extent of involvement of other tissue/organs: Not identified  Peritoneal/Ascitic Fluid: Not applicable  Lymphovascular Invasion: Not identified  Regional Lymph Nodes:       Pelvic Lymph Nodes Examined:                                   2 Sentinel                                    0 Non-sentinel                                   2 Total       Pelvic Lymph Nodes with Metastasis: 0       Para-aortic Lymph Nodes Examined:                                    0 Sentinel                                    0 Non-sentinel                                    0 Total       Para-aortic Lymph Nodes with Metastasis: 0  Distant Metastasis:       Distant Site(s) Involved: Not applicable  Pathologic Stage Classification (pTNM, AJCC 8th Edition): pT3a, pN0  Ancillary Studies: MMR / MSI testing will be ordered  Representative Tumor Block: C10  Comment(s): Pancytokeratin was performed on the lymph nodes and is negative.    08/21/2021 Surgery   Surgeon: Donaciano Eva    Operation: Robotic-assisted laparoscopic total hysterectomy with bilateral salpingoophorectomy, SLN biopsy     Operative Findings:  : 6cm uterus with anterior fibroid (approx 4cm). Normal appearing tubes and ovaries. No suspicious nodes. Bilateral mapping.     10/03/2021 Cancer Staging   Staging form: Corpus Uteri - Carcinoma and Carcinosarcoma, AJCC 8th Edition - Clinical stage from 10/03/2021: FIGO Stage III (cT3, cN0, cM0) - Signed by Heath Lark, MD on 10/03/2021 Stage prefix: Initial diagnosis    10/14/2021 -  Chemotherapy   Patient is on Treatment Plan : Uterine Carboplatin (AUC 5) 21d         Past Medical History:  Diagnosis Date   Arthritis    Complication of anesthesia    "years ago, hard to awaken, no with recent procedures."   Diverticulosis    Endometrial cancer ([place])    Family history of colon cancer 10/15/2021   Gallstones    GERD (gastroesophageal reflux disease)    Headache 08/15/2021   none recent   HTN (hypertension)    Hyperlipidemia    Irritable bowel syndrome (IBS)    Osteoarthritis    Osteopenia    Osteoporosis    PMB (postmenopausal bleeding) 08/15/2021   Skin cancer    skin cancer -    Urinary frequency 08/15/2021   Wears glasses 08/15/2021    Past  Surgical History:  Procedure Laterality Date   bilateral cataracts  COLONOSCOPY     INGUINAL HERNIA REPAIR Bilateral 09/13/2020   Procedure: LAPAROSCOPIC BILATERAL INGUINAL HERNIA REPAIR WITH MESH;  Surgeon: Ralene Ok, MD;  Location: [facility];  Service: General;  Laterality: Bilateral;   ROBOTIC ASSISTED TOTAL HYSTERECTOMY WITH BILATERAL SALPINGO OOPHERECTOMY Bilateral 08/21/2021   Procedure: XI ROBOTIC ASSISTED TOTAL HYSTERECTOMY WITH BILATERAL SALPINGO OOPHORECTOMY;  Surgeon: Everitt Amber, MD;  Location: [facility];  Service: Gynecology;  Laterality: Bilateral;   SENTINEL NODE BIOPSY N/A 08/21/2021   Procedure: SENTINEL NODE BIOPSY;  Surgeon: Everitt Amber, MD;  Location: [facility];  Service: Gynecology;  Laterality: N/A;   uterine prolapse surgery      Social History   Socioeconomic History   Marital status: Widowed    Spouse name: Not on file   Number of children: 1   Years of education: Not on file   Highest education level: Not on file  Occupational History   Occupation: retired  Tobacco Use   Smoking status: Never   Smokeless tobacco: Never  Vaping Use   Vaping Use: Never used  Substance and Sexual Activity   Alcohol use: No   Drug use: No   Sexual activity: Not Currently    Birth control/protection: None  Other Topics Concern   Not on file  Social History Narrative   One Boy    Daily Caffeine - tea         Social Determinants of Health   Financial Resource Strain: Not on file  Food Insecurity: Not on file  Transportation Needs: Not on file  Physical Activity: Not on file  Stress: Not on file  Social Connections: Not on file     FAMILY HISTORY:  We obtained a detailed, 4-generation family history.  Significant diagnoses are listed below: Family History  Problem Relation Age of Onset   Colon cancer Mother 25   Colon cancer Sister 31   Colon polyps Sister    Cancer Maternal Aunt        unknown type   Cancer Paternal  Aunt        unknown type; ?intestinal   Cancer Cousin        maternal female cousins; x3; unknown type   Breast cancer Cousin        maternal female cousin    Sophia Santos is unaware of previous family history of genetic testing for hereditary cancer risks. Patient's maternal ancestors are of [place] descent, and paternal ancestors are of [place] descent. There is maternal and paternal Jewish ancestry. There is no known consanguinity.  GENETIC COUNSELING ASSESSMENT: Sophia Santos is a 87 y.o. female with a personal and family history of cancer which is somewhat suggestive of a hereditary cancer syndrome and predisposition to cancer given the presence of related cancers in the family. We, therefore, discussed and recommended the following at today's visit.   DISCUSSION: We discussed that 5 - 10% of cancer is hereditary.  Most cases of hereditary endometrial and colon cancers are associated with mutations in the Lynch syndrome genes.  There are other genes that can be associated with hereditary colon cancer syndromes.  We discussed that testing is beneficial for several reasons including knowing how to follow individuals after completing their treatment and understanding if other family members could be at risk for cancer and allowing them to undergo genetic testing.   We reviewed the characteristics, features and inheritance patterns of hereditary cancer syndromes. We also discussed genetic testing, including the appropriate family members to test, the  process of testing, insurance coverage and turn-around-time for results. We discussed the implications of a negative, positive, carrier and/or variant of uncertain significant result. We recommended Sophia Santos pursue genetic testing for a panel that includes genes associated with endometrial, colon, and breast cancers.   Based on Sophia Santos's personal and family history of cancer, she meets medical criteria for genetic testing. Despite that she meets criteria,  she may still have an out of pocket cost.   PLAN: Sophia Santos did not wish to pursue genetic testing at today's visit. We understand this decision and remain available to coordinate genetic testing at any time in the future. We, therefore, recommend Sophia Santos continue to follow the cancer screening guidelines given by her oncology and primary healthcare provider.  We discussed the importance of family members notifying their providers of their family history of cancer so that they can have appropriate cancer screenings.   Lastly, we encouraged Sophia Santos to remain in contact with cancer genetics annually so that we can continuously update the family history and inform her of any changes in cancer genetics and testing that may be of benefit for this family.   Sophia Santos questions were answered to her satisfaction today. Our contact information was provided should additional questions or concerns arise. Thank you for the referral and allowing [place] to share in the care of your patient.   Sophia Santos M. Joette Catching, [place], [facility] Genetic Counselor [email or web address] (P) [phone]  The patient was seen for a total of 35 minutes in face-to-face genetic counseling.  The patient was seen during infusion in the presence of an interpreter.  Drs. Magrinat, Lindi Adie and/or Burr Medico were available to discuss this case as needed.    _______________________________________________________________________ For Office Staff:  Number of people involved in session: 1 Was an Intern/ student involved with case: no

## 2021-10-15 NOTE — Telephone Encounter (Signed)
Sophia Santos called with a question about Tylenol.  Called Sophia Santos (friend) who said Sophia Santos has read the handout that was given to her after chemotherapy yesterday and saw that she is not supposed to take Tylenol.  She has occasional stomach pain and takes Tylenol for it and wants to make sure she can take it if needed.  Advised that she can take Tylenol as needed.

## 2021-11-01 MED FILL — Fosaprepitant Dimeglumine For IV Infusion 150 MG (Base Eq): INTRAVENOUS | Qty: 5 | Status: AC

## 2021-11-01 MED FILL — Dexamethasone Sodium Phosphate Inj 100 MG/10ML: INTRAMUSCULAR | Qty: 1 | Status: AC

## 2021-11-04 ENCOUNTER — Inpatient Hospital Stay (HOSPITAL_BASED_OUTPATIENT_CLINIC_OR_DEPARTMENT_OTHER): Payer: Medicare Other | Admitting: Hematology and Oncology

## 2021-11-04 ENCOUNTER — Encounter: Payer: Self-pay | Admitting: Hematology and Oncology

## 2021-11-04 ENCOUNTER — Other Ambulatory Visit: Payer: Self-pay

## 2021-11-04 ENCOUNTER — Inpatient Hospital Stay: Payer: Medicare Other

## 2021-11-04 ENCOUNTER — Telehealth: Payer: Self-pay | Admitting: Oncology

## 2021-11-04 ENCOUNTER — Inpatient Hospital Stay: Payer: Medicare Other | Attending: Gynecologic Oncology

## 2021-11-04 ENCOUNTER — Other Ambulatory Visit: Payer: Self-pay | Admitting: Hematology and Oncology

## 2021-11-04 DIAGNOSIS — D61818 Other pancytopenia: Secondary | ICD-10-CM | POA: Diagnosis not present

## 2021-11-04 DIAGNOSIS — C541 Malignant neoplasm of endometrium: Secondary | ICD-10-CM | POA: Insufficient documentation

## 2021-11-04 LAB — CBC WITH DIFFERENTIAL (CANCER CENTER ONLY)
Abs Immature Granulocytes: 0 K/uL (ref 0.00–0.07)
Basophils Absolute: 0 K/uL (ref 0.0–0.1)
Basophils Relative: 0 %
Eosinophils Absolute: 0.1 K/uL (ref 0.0–0.5)
Eosinophils Relative: 1 %
HCT: 34.1 % — ABNORMAL LOW (ref 36.0–46.0)
Hemoglobin: 11.4 g/dL — ABNORMAL LOW (ref 12.0–15.0)
Immature Granulocytes: 0 %
Lymphocytes Relative: 41 %
Lymphs Abs: 1.6 K/uL (ref 0.7–4.0)
MCH: 31.6 pg (ref 26.0–34.0)
MCHC: 33.4 g/dL (ref 30.0–36.0)
MCV: 94.5 fL (ref 80.0–100.0)
Monocytes Absolute: 0.4 K/uL (ref 0.1–1.0)
Monocytes Relative: 11 %
Neutro Abs: 1.8 K/uL (ref 1.7–7.7)
Neutrophils Relative %: 47 %
Platelet Count: 139 K/uL — ABNORMAL LOW (ref 150–400)
RBC: 3.61 MIL/uL — ABNORMAL LOW (ref 3.87–5.11)
RDW: 13.7 % (ref 11.5–15.5)
WBC Count: 3.9 K/uL — ABNORMAL LOW (ref 4.0–10.5)
nRBC: 0 % (ref 0.0–0.2)

## 2021-11-04 LAB — BASIC METABOLIC PANEL - CANCER CENTER ONLY
Anion gap: 7 (ref 5–15)
BUN: 20 mg/dL (ref 8–23)
CO2: 25 mmol/L (ref 22–32)
Calcium: 9 mg/dL (ref 8.9–10.3)
Chloride: 102 mmol/L (ref 98–111)
Creatinine: 0.71 mg/dL (ref 0.44–1.00)
GFR, Estimated: >60 mL/min (ref >60)
Glucose, Bld: 107 mg/dL — ABNORMAL HIGH (ref 70–99)
Potassium: 4.2 mmol/L (ref 3.5–5.1)
Sodium: 134 mmol/L — ABNORMAL LOW (ref 135–145)

## 2021-11-04 MED ORDER — SODIUM CHLORIDE 0.9 % IV SOLN: Freq: Once | INTRAVENOUS | Status: AC

## 2021-11-04 MED ORDER — SODIUM CHLORIDE 0.9 % IV SOLN
150.0000 mg | Freq: Once | INTRAVENOUS | Status: AC
  Administered 2021-11-04: 150 mg via INTRAVENOUS
  Filled 2021-11-04: qty 150

## 2021-11-04 MED ORDER — SODIUM CHLORIDE 0.9 % IV SOLN
10.0000 mg | Freq: Once | INTRAVENOUS | Status: AC
  Administered 2021-11-04: 10 mg via INTRAVENOUS
  Filled 2021-11-04: qty 10

## 2021-11-04 MED ORDER — SODIUM CHLORIDE 0.9 % IV SOLN
323.0000 mg | Freq: Once | INTRAVENOUS | Status: AC
  Administered 2021-11-04: 320 mg via INTRAVENOUS
  Filled 2021-11-04: qty 32

## 2021-11-04 MED ORDER — PALONOSETRON HCL INJECTION 0.25 MG/5ML
0.2500 mg | Freq: Once | INTRAVENOUS | Status: AC
  Administered 2021-11-04: 0.25 mg via INTRAVENOUS
  Filled 2021-11-04: qty 5

## 2021-11-04 NOTE — Assessment & Plan Note (Signed)
Overall, she tolerated treatment very well except for very mild pancytopenia of which she is not symptomatic We will proceed with adjuvant chemotherapy with single agent carboplatin only I have reviewed documentation from genetic counselor, she has declined genetic testing With the help of GYN navigator, we will reschedule her appointment if possible due to the availability of [place] interpreter for her radiation oncology appointment I will continue to see her every 3 weeks for further follow-up

## 2021-11-04 NOTE — Patient Instructions (Signed)
[facility] MEDICAL [facility]COL[facility]  Discharge Instructions: Thank you for choosing [facility] to provide your oncology and hematology care.   If you have a lab appointment with the Cancer Center, please go directly to the Cancer Center and check in at the registration area.   Wear comfortable clothing and clothing appropriate for easy access to any Portacath or PICC line.   We strive to give you quality time with your provider. You may need to reschedule your appointment if you arrive late (15 or more minutes).  Arriving late affects you and other patients whose appointments are after yours.  Also, if you miss three or more appointments without notifying the office, you may be dismissed from the clinic at the provider's discretion.      For prescription refill requests, have your pharmacy contact our office and allow 72 hours for refills to be completed.    Today you received the following chemotherapy and/or immunotherapy agents :Carboplatin   To help prevent nausea and vomiting after your treatment, we encourage you to take your nausea medication as directed.  BELOW ARE SYMPTOMS THAT SHOULD BE REPORTED IMMEDIATELY: *FEVER GREATER THAN 100.4 F (38 C) OR HIGHER *CHILLS OR SWEATING *NAUSEA AND VOMITING THAT IS NOT CONTROLLED WITH YOUR NAUSEA MEDICATION *UNUSUAL SHORTNESS OF BREATH *UNUSUAL BRUISING OR BLEEDING *URINARY PROBLEMS (pain or burning when urinating, or frequent urination) *BOWEL PROBLEMS (unusual diarrhea, constipation, pain near the anus) TENDERNESS IN MOUTH AND THROAT WITH OR WITHOUT PRESENCE OF ULCERS (sore throat, sores in mouth, or a toothache) UNUSUAL RASH, SWELLING OR PAIN  UNUSUAL VAGINAL DISCHARGE OR ITCHING   Items with * indicate a potential emergency and should be followed up as soon as possible or go to the Emergency Department if any problems should occur.  Please show the CHEMOTHERAPY ALERT CARD or IMMUNOTHERAPY ALERT CARD at check-in to  the Emergency Department and triage nurse.  Should you have questions after your visit or need to cancel or reschedule your appointment, please contact [facility]  Dept: [phone]  and follow the prompts.  Office hours are 8:00 a.m. to 4:30 p.m. Monday - Friday. Please note that voicemails left after 4:00 p.m. may not be returned until the following business day.  We are closed weekends and major holidays. You have access to a nurse at all times for urgent questions. Please call the main number to the clinic Dept: [phone] and follow the prompts.   For any non-urgent questions, you may also contact your provider using MyChart. We now offer e-Visits for anyone 18 and older to request care online for non-urgent symptoms. For details visit [email or web address].   Also download the MyChart app! Go to the app store, search "MyChart", open the app, select [facility], and log in with your MyChart username and password.  Due to Covid, a mask is required upon entering the hospital/clinic. If you do not have a mask, one will be given to you upon arrival. For doctor visits, patients may have 1 support person aged 18 or older with them. For treatment visits, patients cannot have anyone with them due to current Covid guidelines and our immunocompromised population.   

## 2021-11-04 NOTE — Progress Notes (Signed)
[facility] OFFICE PROGRESS NOTE  Patient Care Team: Plotnikov, Evie Lacks, MD as PCP - General Azucena Fallen, MD (Obstetrics and Gynecology) Ralene Ok, MD as Consulting Physician (General Surgery) Milus Banister, MD as Attending Physician (Gastroenterology) Awanda Mink Craige Cotta, RN as Oncology Nurse Navigator (Oncology)  ASSESSMENT & PLAN:  Endometrial adenocarcinoma (Sophia Santos) Overall, she tolerated treatment very well except for very mild pancytopenia of which she is not symptomatic We will proceed with adjuvant chemotherapy with single agent carboplatin only I have reviewed documentation from genetic counselor, she has declined genetic testing With the help of GYN navigator, we will reschedule her appointment if possible due to the availability of [place] interpreter for her radiation oncology appointment I will continue to see her every 3 weeks for further follow-up  Pancytopenia, acquired [facility]) She has very mild pancytopenia likely due to side effects of treatment but remained asymptomatic We will proceed with treatment as scheduled  No orders of the defined types were placed in this encounter.   All questions were answered. The patient knows to call the clinic with any problems, questions or concerns. The total time spent in the appointment was 20 minutes encounter with patients including review of chart and various tests results, discussions about plan of care and coordination of care plan   Heath Lark, MD 11/04/2021 2:21 PM  INTERVAL HISTORY: Please see below for problem oriented charting. she returns for treatment follow-up seen prior to cycle 2 of carboplatin [place] interpreter is present She tolerated last cycle of treatment very well without any side effects  REVIEW OF SYSTEMS:   Constitutional: Denies fevers, chills or abnormal weight loss Eyes: Denies blurriness of vision Ears, nose, mouth, throat, and face: Denies mucositis or sore  throat Respiratory: Denies cough, dyspnea or wheezes Cardiovascular: Denies palpitation, chest discomfort or lower extremity swelling Gastrointestinal:  Denies nausea, heartburn or change in bowel habits Skin: Denies abnormal skin rashes Lymphatics: Denies new lymphadenopathy or easy bruising Neurological:Denies numbness, tingling or new weaknesses Behavioral/Psych: Mood is stable, no new changes  All other systems were reviewed with the patient and are negative.  I have reviewed the past medical history, past surgical history, social history and family history with the patient and they are unchanged from previous note.  ALLERGIES:  is allergic to fosamax [alendronate sodium], lipitor [atorvastatin], voltaren [diclofenac sodium], and zocor [simvastatin].  MEDICATIONS:  Current Outpatient Medications  Medication Sig Dispense Refill   acetaminophen (TYLENOL) 325 MG tablet Take 352 mg by mouth every 6 (six) hours as needed for mild pain or moderate pain.     ALPRAZolam (XANAX) 1 MG tablet Take 0.5-1 tablets (0.5-1 mg total) by mouth at bedtime as needed for anxiety or sleep. 90 tablet 1   amLODipine (NORVASC) 5 MG tablet Take 1 tablet (5 mg total) by mouth daily. 90 tablet 3   captopril (CAPOTEN) 50 MG tablet TAKE 1 TABLET BY MOUTH TWICE A DAY 180 tablet 3   Cholecalciferol 1000 UNITS tablet Take 1,000 Units by mouth daily. Vitamin d     Coenzyme Q10 (CO Q-10) 100 MG CAPS Take by mouth daily. (Patient not taking: Reported on 10/02/2021)     ezetimibe (ZETIA) 10 MG tablet TAKE 1 TABLET BY MOUTH EVERY DAY 90 tablet 3   MYRBETRIQ 25 MG TB24 tablet Take 1 tablet (25 mg total) by mouth daily. (Patient taking differently: Take 25 mg by mouth at bedtime.) 90 tablet 3   omeprazole (PRILOSEC) 20 MG capsule Take 2 capsules (40 mg  total) by mouth daily. Annual appt due in March must see provider for future refills (Patient taking differently: Take 20 mg by mouth daily. Prn at hs also) 180 capsule 3    ondansetron (ZOFRAN) 8 MG tablet Take 1 tablet (8 mg total) by mouth 2 (two) times daily as needed for refractory nausea / vomiting. Start on day 3 after carboplatin chemo. 30 tablet 1   Pancrelipase, Lip-Prot-Amyl, (CREON) 24000-76000 units CPEP TAKE ONE CAPSULE BY MOUTH 3 TIMES A DAY WITH FOOD (Patient taking differently: as needed. TAKE ONE CAPSULE BY MOUTH 3 TIMES A DAY WITH FOOD) 270 capsule 3   polyethylene glycol powder (GAVILAX) 17 GM/SCOOP powder MIX 1 CAPFUL (17 GRAMS) AS DIRECTED AND DRINK TWICE A DAY AS NEEDED FOR MODERATE CONSTIPATION 510 g 5   prochlorperazine (COMPAZINE) 10 MG tablet Take 1 tablet (10 mg total) by mouth every 6 (six) hours as needed (Nausea or vomiting). 30 tablet 1   propranolol ER (INDERAL LA) 120 MG 24 hr capsule Take 1 capsule (120 mg total) by mouth daily. 90 capsule 3   RESTASIS 0.05 % ophthalmic emulsion Place 1 drop into both eyes 2 (two) times daily.      Vitamin A 2400 MCG (8000 UT) CAPS Take 2,400 mcg by mouth daily.     No current facility-administered medications for this visit.   Facility-Administered Medications Ordered in Other Visits  Medication Dose Route Frequency Provider Last Rate Last Admin   CARBOplatin (PARAPLATIN) 320 mg in sodium chloride 0.9 % 250 mL chemo infusion  320 mg Intravenous Once Heath Lark, MD        SUMMARY OF ONCOLOGIC HISTORY: Oncology History Overview Note  Endometrioid FIGO grade 1-2, MSI high   Endometrial adenocarcinoma ([place])  07/19/2021 Imaging   US pelvis 1. Endometrial thickness of 5.6 mm. In the setting of post-menopausal bleeding, endometrial sampling is indicated to exclude carcinoma.  2. Densely calcified shadowing mass within the right uterus presumably representing a calcified fibroid.   07/31/2021 Pathology Results   SURGICAL PATHOLOGY   FINAL MICROSCOPIC DIAGNOSIS:   A. ENDOMETRIUM, BIOPSY:  - Endometrial adenocarcinoma, FIGO grade 1.  See comment  SUMMARY INTERPRETATION: ABNORMAL   There is loss  of the major and minor MMR proteins MLH1 and PMS2. The loss of expression may be secondary to promoter hyper-methylation, gene mutation or other genetic event. BRAF mutation testing and/or MLH1 methylation testing is indicated. The presence of a BRAF mutation and/or MLH1 hypermethylation is indicative of a sporadic-type tumor. The absence of either BRAF mutation and/or presence of normal methylation indicate the possible presence of a hereditary germline mutation (e.g. Lynch syndrome) and referral to genetic counseling is warranted.    08/05/2021 Initial Diagnosis   Endometrial adenocarcinoma ([place])   08/21/2021 Pathology Results   FINAL MICROSCOPIC DIAGNOSIS:   A. LYMPH NODE, SENTINEL, RIGHT OBTURATOR, EXCISION:  - Lymph node, negative for carcinoma (0/1)   B. LYMPH NODE, SENTINEL, LEFT OBTURATOR, EXCISION:  - Lymph node, negative for carcinoma (0/1)   C. UTERUS, CERVIX, BILATERAL FALLOPIAN TUBES AND OVARIES:  - Endometrioid adenocarcinoma, FIGO grade 2  - Carcinoma invades through the entire myometrium and focally involves  the serosal surface  - Benign unremarkable cervix  - Benign bilateral fallopian tubes and ovaries  - See oncology table  - See comment   COMMENT:   C.  Tumor showed areas with clear cell morphology but immunostains for Napsin A and P504S do not show evidence of clear cell carcinoma.  Also, there  is significant tumor necrosis, but the viable portion of tumor appears to be FIGO grade 2.  Dr. Tresa Moore reviewed the select slides from the case and concurs with presence of serosal invasion.    ONCOLOGY TABLE:   UTERUS, CARCINOMA OR CARCINOSARCOMA: Resection   Procedure: Total hysterectomy and bilateral salpingo-oophorectomy  Histologic Type: Endometrioid adenocarcinoma  Histologic Grade: FIGO grade 1  Myometrial Invasion:       Depth of Myometrial Invasion (mm): 17 mm       Myometrial Thickness (mm): 17 mm       Percentage of Myometrial Invasion: 100%  Uterine Serosa  Involvement: Present, focal  Cervical stromal Involvement: Not identified  Extent of involvement of other tissue/organs: Not identified  Peritoneal/Ascitic Fluid: Not applicable  Lymphovascular Invasion: Not identified  Regional Lymph Nodes:       Pelvic Lymph Nodes Examined:                                   2 Sentinel                                   0 Non-sentinel                                   2 Total       Pelvic Lymph Nodes with Metastasis: 0       Para-aortic Lymph Nodes Examined:                                    0 Sentinel                                    0 Non-sentinel                                    0 Total       Para-aortic Lymph Nodes with Metastasis: 0  Distant Metastasis:       Distant Site(s) Involved: Not applicable  Pathologic Stage Classification (pTNM, AJCC 8th Edition): pT3a, pN0  Ancillary Studies: MMR / MSI testing will be ordered  Representative Tumor Block: C10  Comment(s): Pancytokeratin was performed on the lymph nodes and is negative.    08/21/2021 Surgery   Surgeon: Donaciano Eva    Operation: Robotic-assisted laparoscopic total hysterectomy with bilateral salpingoophorectomy, SLN biopsy     Operative Findings:  : 6cm uterus with anterior fibroid (approx 4cm). Normal appearing tubes and ovaries. No suspicious nodes. Bilateral mapping.     10/03/2021 Cancer Staging   Staging form: Corpus Uteri - Carcinoma and Carcinosarcoma, AJCC 8th Edition - Clinical stage from 10/03/2021: FIGO Stage III (cT3, cN0, cM0) - Signed by Heath Lark, MD on 10/03/2021 Stage prefix: Initial diagnosis    10/14/2021 -  Chemotherapy   Patient is on Treatment Plan : Uterine Carboplatin (AUC 5) 21d       PHYSICAL EXAMINATION: ECOG PERFORMANCE STATUS: 1 - Symptomatic but completely ambulatory  Vitals:   11/04/21 1242  BP: (!) 159/71  Pulse: (!) 57  Resp: 18  Temp:  97.9 F (36.6 C)  SpO2: 100%   Filed Weights   11/04/21 1242  Weight: 134 lb 6.4 oz  (61 kg)    GENERAL:alert, no distress and comfortable SKIN: skin color, texture, turgor are normal, no rashes or significant lesions EYES: normal, Conjunctiva are pink and non-injected, sclera clear OROPHARYNX:no exudate, no erythema and lips, buccal mucosa, and tongue normal  NECK: supple, thyroid normal size, non-tender, without nodularity LYMPH:  no palpable lymphadenopathy in the cervical, axillary or inguinal LUNGS: clear to auscultation and percussion with normal breathing effort HEART: regular rate & rhythm and no murmurs with mild bilateral lower extremity edema ABDOMEN:abdomen soft, non-tender and normal bowel sounds Musculoskeletal:no cyanosis of digits and no clubbing  NEURO: alert & oriented x 3 with fluent speech, no focal motor/sensory deficits  LABORATORY DATA:  I have reviewed the data as listed    Component Value Date/Time   NA 134 (L) 11/04/2021 1218   K 4.2 11/04/2021 1218   CL 102 11/04/2021 1218   CO2 25 11/04/2021 1218   GLUCOSE 107 (H) 11/04/2021 1218   GLUCOSE 96 11/16/2006 1058   BUN 20 11/04/2021 1218   CREATININE 0.71 11/04/2021 1218   CALCIUM 9.0 11/04/2021 1218   PROT 7.0 10/03/2021 1236   ALBUMIN 3.9 10/03/2021 1236   AST 23 10/03/2021 1236   ALT 30 10/03/2021 1236   ALKPHOS 139 (H) 10/03/2021 1236   BILITOT 0.6 10/03/2021 1236   GFRNONAA >60 11/04/2021 1218   GFRAA >60 09/13/2020 0800    No results found for: SPEP, UPEP  Lab Results  Component Value Date   WBC 3.9 (L) 11/04/2021   NEUTROABS 1.8 11/04/2021   HGB 11.4 (L) 11/04/2021   HCT 34.1 (L) 11/04/2021   MCV 94.5 11/04/2021   PLT 139 (L) 11/04/2021      Chemistry      Component Value Date/Time   NA 134 (L) 11/04/2021 1218   K 4.2 11/04/2021 1218   CL 102 11/04/2021 1218   CO2 25 11/04/2021 1218   BUN 20 11/04/2021 1218   CREATININE 0.71 11/04/2021 1218      Component Value Date/Time   CALCIUM 9.0 11/04/2021 1218   ALKPHOS 139 (H) 10/03/2021 1236   AST 23 10/03/2021 1236    ALT 30 10/03/2021 1236   BILITOT 0.6 10/03/2021 1236

## 2021-11-04 NOTE — Progress Notes (Signed)
Met with patient/accompanying adult and interpreter at registration to complete grant paperwork.  Patient approved for one-time $1000 [employer] grant to assist with personal expenses while going through treatment. Discussed in detail expenses and how they are covered. She received a gift card today from grant.  She has a copy of the approval letter and expense sheet along with the Outpatient pharmacy information in a green folder and my card for any additional financial questions or concerns.

## 2021-11-04 NOTE — Telephone Encounter (Signed)
Called Alfia and advised her that appointment on 11/27/21 for Aneya's HDR treatment has been rescheduled to 11/26/21 at 1:00.  She verbalized understanding and agreement.

## 2021-11-04 NOTE — Assessment & Plan Note (Signed)
She has very mild pancytopenia likely due to side effects of treatment but remained asymptomatic We will proceed with treatment as scheduled

## 2021-11-13 NOTE — Progress Notes (Incomplete)
Sophia Santos is here today for [facility] fitting and treatment.  Does the patient complain of any of the following:  Pain:*** Abdominal bloating: *** Diarrhea/Constipation: *** Nausea/Vomiting: *** Vaginal Discharge: *** Blood in Urine or Stool: *** Urinary Issues (dysuria/incomplete emptying/ incontinence/ increased frequency/urgency/nocturia): *** Post radiation skin changes: ***   Additional comments if applicable: ***  ***

## 2021-11-19 ENCOUNTER — Telehealth: Payer: Self-pay | Admitting: *Deleted

## 2021-11-19 ENCOUNTER — Ambulatory Visit: Payer: Medicare Other | Admitting: Radiation Oncology

## 2021-11-19 NOTE — Telephone Encounter (Signed)
CALLED PATIENT TO REMIND OF NEW HDR [place] FOR 11-20-21, SPOKE WITH RELATIVE [name] AND SHE IS AWARE OF THESE APPTS.

## 2021-11-19 NOTE — Progress Notes (Signed)
Radiation Oncology         [phone] ________________________________  Name: Sophia Santos MRN: [id number]  Date: 11/20/2021  DOB: 1934-10-23  Vaginal Brachytherapy Procedure Note  CC: Santos, Sophia Lacks, MD Santos Amber, MD    ICD-10-CM   1. Endometrial adenocarcinoma ([facility])  C54.1       Diagnosis: Stage IIIA grade 2 endometrial cancer; endometrial adenocarcinoma    Narrative: She returns today for vaginal cylinder fitting. Since her initial consultation date of 10/02/21 , the patient began chemotherapy consisting of carboplatin on 10/14/21 under the care of Dr. Alvy Bimler. Per her most recent visit with Dr. Alvy Bimler on 11/04/21, the patient is noted to tolerate treatment very well except for very mild pancytopenia, for which she is asymptomatic.   She denies any pelvic pain vaginal bleeding or discharge.  ALLERGIES: is allergic to fosamax [alendronate sodium], lipitor [atorvastatin], voltaren [diclofenac sodium], and zocor [simvastatin].  Meds: Current Outpatient Medications  Medication Sig Dispense Refill   acetaminophen (TYLENOL) 325 MG tablet Take 352 mg by mouth every 6 (six) hours as needed for mild pain or moderate pain.     ALPRAZolam (XANAX) 1 MG tablet Take 0.5-1 tablets (0.5-1 mg total) by mouth at bedtime as needed for anxiety or sleep. 90 tablet 1   amLODipine (NORVASC) 5 MG tablet Take 1 tablet (5 mg total) by mouth daily. 90 tablet 3   captopril (CAPOTEN) 50 MG tablet TAKE 1 TABLET BY MOUTH TWICE A DAY 180 tablet 3   Cholecalciferol 1000 UNITS tablet Take 1,000 Units by mouth daily. Vitamin d     Coenzyme Q10 (CO Q-10) 100 MG CAPS Take by mouth daily.     ezetimibe (ZETIA) 10 MG tablet TAKE 1 TABLET BY MOUTH EVERY DAY 90 tablet 3   MYRBETRIQ 25 MG TB24 tablet Take 1 tablet (25 mg total) by mouth daily. (Patient taking differently: Take 25 mg by mouth at bedtime.) 90 tablet 3   omeprazole (PRILOSEC) 20 MG capsule Take 2 capsules (40 mg total) by mouth daily. Annual  appt due in March must see provider for future refills (Patient taking differently: Take 20 mg by mouth daily. Prn at hs also) 180 capsule 3   Pancrelipase, Lip-Prot-Amyl, (CREON) 24000-76000 units CPEP TAKE ONE CAPSULE BY MOUTH 3 TIMES A DAY WITH FOOD (Patient taking differently: as needed. TAKE ONE CAPSULE BY MOUTH 3 TIMES A DAY WITH FOOD) 270 capsule 3   polyethylene glycol powder (GAVILAX) 17 GM/SCOOP powder MIX 1 CAPFUL (17 GRAMS) AS DIRECTED AND DRINK TWICE A DAY AS NEEDED FOR MODERATE CONSTIPATION 510 g 5   propranolol ER (INDERAL LA) 120 MG 24 hr capsule Take 1 capsule (120 mg total) by mouth daily. 90 capsule 3   RESTASIS 0.05 % ophthalmic emulsion Place 1 drop into both eyes 2 (two) times daily.      Vitamin A 2400 MCG (8000 UT) CAPS Take 2,400 mcg by mouth daily.     ondansetron (ZOFRAN) 8 MG tablet Take 1 tablet (8 mg total) by mouth 2 (two) times daily as needed for refractory nausea / vomiting. Start on day 3 after carboplatin chemo. (Patient not taking: Reported on 11/20/2021) 30 tablet 1   prochlorperazine (COMPAZINE) 10 MG tablet Take 1 tablet (10 mg total) by mouth every 6 (six) hours as needed (Nausea or vomiting). (Patient not taking: Reported on 11/20/2021) 30 tablet 1   No current facility-administered medications for this encounter.    Physical Findings: The patient is in no acute distress. Patient  is alert and oriented.  weight is 135 lb (61.2 kg). Her blood pressure is 145/76 (abnormal) and her pulse is 71. Her respiration is 18 and oxygen saturation is 99%.   No palpable cervical, supraclavicular or axillary lymphoadenopathy. The heart has a regular rate and rhythm. The lungs are clear to auscultation. Abdomen soft and non-tender.  On pelvic examination the external genitalia were unremarkable. A speculum exam was performed. Vaginal cuff intact, no mucosal lesions. On bimanual exam there were no pelvic masses appreciated.  Residual green suture noted along the right vaginal  cuff  Lab Findings: Lab Results  Component Value Date   WBC 3.9 (L) 11/04/2021   HGB 11.4 (L) 11/04/2021   HCT 34.1 (L) 11/04/2021   MCV 94.5 11/04/2021   PLT 139 (L) 11/04/2021    Radiographic Findings: No results found.  Impression: Stage IIIA grade 2 endometrial cancer; endometrial adenocarcinoma  Patient was fitted for a vaginal cylinder. The patient will be treated with a 3.0 cm diameter cylinder with a treatment length of 3.0 cm. This distended the vaginal vault without undue discomfort. The patient tolerated the procedure well.  The patient was successfully fitted for a vaginal cylinder. The patient is appropriate to begin vaginal brachytherapy.   Plan: The patient will proceed with CT simulation and vaginal brachytherapy today.    _______________________________   Blair Promise, PhD, MD  This document serves as a record of services personally performed by Gery Pray, MD. It was created on his behalf by Roney Mans, a trained medical scribe. The creation of this record is based on the scribe's personal observations and the provider's statements to them. This document has been checked and approved by the attending provider.

## 2021-11-19 NOTE — Progress Notes (Signed)
  Radiation Oncology         [phone] ________________________________  Name: Sophia Santos MRN: [id number]  Date: 11/20/2021  DOB: 31-Jul-1934  CC: Plotnikov, Evie Lacks, MD  Everitt Amber, MD  HDR BRACHYTHERAPY NOTE  DIAGNOSIS: Stage IIIA grade 2 endometrial cancer; endometrial adenocarcinoma    Simple treatment device note: Patient had construction of her custom vaginal cylinder. She will be treated with a 3.0 cm diameter segmented cylinder. This conforms to her anatomy without undue discomfort.  Vaginal brachytherapy procedure node: The patient was brought to the [place] suite. Identity was confirmed. All relevant records and images related to the planned course of therapy were reviewed. The patient freely provided informed written consent to proceed with treatment after reviewing the details related to the planned course of therapy. The consent form was witnessed and verified by the simulation staff. Then, the patient was set-up in a stable reproducible supine position for radiation therapy. Pelvic exam revealed the vaginal cuff to be intact . The patient's custom vaginal cylinder was placed in the proximal vagina. This was affixed to the CT/MR stabilization plate to prevent slippage. Patient tolerated the placement well.  Verification simulation note:  A fiducial marker was placed within the vaginal cylinder. An AP and lateral film was then obtained through the pelvis area. This documented accurate position of the vaginal cylinder for treatment.  HDR BRACHYTHERAPY TREATMENT  The remote afterloading device was affixed to the vaginal cylinder by catheter. Patient then proceeded to undergo her first high-dose-rate treatment directed at the proximal vagina. The patient was prescribed a dose of 6.0 gray to be delivered to the mucosal surface. Treatment length was 3.0 cm. Patient was treated with 1 channel using 7 dwell positions. Treatment time was 190.2 seconds. Iridium 192 was the high-dose-rate  source for treatment. The patient tolerated the treatment well. After completion of her therapy, a radiation survey was performed documenting return of the iridium source into the [employer] safe.   PLAN: The patient will return next week for her second high-dose-rate treatment. ________________________________  -----------------------------------  Blair Promise, PhD, MD  This document serves as a record of services personally performed by Gery Pray, MD. It was created on his behalf by Roney Mans, a trained medical scribe. The creation of this record is based on the scribe's personal observations and the provider's statements to them. This document has been checked and approved by the attending provider.

## 2021-11-20 ENCOUNTER — Ambulatory Visit
Admission: RE | Admit: 2021-11-20 | Discharge: 2021-11-20 | Disposition: A | Discharge status: Home / Self Care | Payer: Medicare Other | Source: Ambulatory Visit | Attending: Radiation Oncology | Admitting: Radiation Oncology

## 2021-11-20 ENCOUNTER — Encounter: Payer: Self-pay | Admitting: Radiation Oncology

## 2021-11-20 ENCOUNTER — Other Ambulatory Visit: Payer: Self-pay

## 2021-11-20 VITALS — BP 145/76 | HR 71 | Resp 18 | Wt 135.0 lb

## 2021-11-20 DIAGNOSIS — C541 Malignant neoplasm of endometrium: Secondary | ICD-10-CM

## 2021-11-20 DIAGNOSIS — D61818 Other pancytopenia: Secondary | ICD-10-CM | POA: Diagnosis not present

## 2021-11-20 DIAGNOSIS — Z79899 Other long term (current) drug therapy: Secondary | ICD-10-CM | POA: Insufficient documentation

## 2021-11-20 NOTE — Progress Notes (Signed)
Sophia Santos is here today for [facility] fitting and treatment.  Does the patient complain of any of the following:  Pain:none Abdominal bloating: none Diarrhea/Constipation: sometime she has constipation she is taking senna for relief. Nausea/Vomiting: None Vaginal Discharge: none Blood in Urine or Stool: none Urinary Issues (dysuria/incomplete emptying/ incontinence/ increased frequency/urgency/nocturia): States that she has urgency.sometime Reports nocturia x5 Post radiation skin changes: NA   Additional comments if applicable:   Vitals:   11/20/21 1238  BP: (!) 145/76  Pulse: 71  Resp: 18  SpO2: 99%  Weight: 61.2 kg

## 2021-11-22 MED FILL — Dexamethasone Sodium Phosphate Inj 100 MG/10ML: INTRAMUSCULAR | Qty: 1 | Status: AC

## 2021-11-22 MED FILL — Fosaprepitant Dimeglumine For IV Infusion 150 MG (Base Eq): INTRAVENOUS | Qty: 5 | Status: AC

## 2021-11-25 ENCOUNTER — Inpatient Hospital Stay (HOSPITAL_BASED_OUTPATIENT_CLINIC_OR_DEPARTMENT_OTHER): Payer: Medicare Other | Admitting: Hematology and Oncology

## 2021-11-25 ENCOUNTER — Other Ambulatory Visit: Payer: Self-pay | Admitting: Hematology and Oncology

## 2021-11-25 ENCOUNTER — Other Ambulatory Visit: Payer: Self-pay

## 2021-11-25 ENCOUNTER — Inpatient Hospital Stay: Payer: Medicare Other

## 2021-11-25 ENCOUNTER — Encounter: Payer: Self-pay | Admitting: Hematology and Oncology

## 2021-11-25 ENCOUNTER — Inpatient Hospital Stay: Payer: Medicare Other | Attending: Gynecologic Oncology

## 2021-11-25 ENCOUNTER — Telehealth: Payer: Self-pay | Admitting: *Deleted

## 2021-11-25 DIAGNOSIS — D61818 Other pancytopenia: Secondary | ICD-10-CM | POA: Diagnosis not present

## 2021-11-25 DIAGNOSIS — C541 Malignant neoplasm of endometrium: Secondary | ICD-10-CM

## 2021-11-25 LAB — CBC WITH DIFFERENTIAL (CANCER CENTER ONLY)
Abs Immature Granulocytes: 0.01 10*3/uL (ref 0.00–0.07)
Basophils Absolute: 0 10*3/uL (ref 0.0–0.1)
Basophils Relative: 0 %
Eosinophils Absolute: 0 10*3/uL (ref 0.0–0.5)
Eosinophils Relative: 1 %
HCT: 32.7 % — ABNORMAL LOW (ref 36.0–46.0)
Hemoglobin: 11 g/dL — ABNORMAL LOW (ref 12.0–15.0)
Immature Granulocytes: 0 %
Lymphocytes Relative: 58 %
Lymphs Abs: 1.7 10*3/uL (ref 0.7–4.0)
MCH: 31.4 pg (ref 26.0–34.0)
MCHC: 33.6 g/dL (ref 30.0–36.0)
MCV: 93.4 fL (ref 80.0–100.0)
Monocytes Absolute: 0.4 10*3/uL (ref 0.1–1.0)
Monocytes Relative: 13 %
Neutro Abs: 0.8 10*3/uL — ABNORMAL LOW (ref 1.7–7.7)
Neutrophils Relative %: 28 %
Platelet Count: 163 10*3/uL (ref 150–400)
RBC: 3.5 MIL/uL — ABNORMAL LOW (ref 3.87–5.11)
RDW: 14.9 % (ref 11.5–15.5)
WBC Count: 2.9 10*3/uL — ABNORMAL LOW (ref 4.0–10.5)
nRBC: 0 % (ref 0.0–0.2)

## 2021-11-25 LAB — BASIC METABOLIC PANEL - CANCER CENTER ONLY
Anion gap: 8 (ref 5–15)
BUN: 26 mg/dL — ABNORMAL HIGH (ref 8–23)
CO2: 24 mmol/L (ref 22–32)
Calcium: 8.8 mg/dL — ABNORMAL LOW (ref 8.9–10.3)
Chloride: 103 mmol/L (ref 98–111)
Creatinine: 0.76 mg/dL (ref 0.44–1.00)
GFR, Estimated: >60 mL/min (ref >60)
Glucose, Bld: 106 mg/dL — ABNORMAL HIGH (ref 70–99)
Potassium: 3.9 mmol/L (ref 3.5–5.1)
Sodium: 135 mmol/L (ref 135–145)

## 2021-11-25 MED ORDER — SODIUM CHLORIDE 0.9 % IV SOLN
290.7000 mg | Freq: Once | INTRAVENOUS | Status: AC
  Administered 2021-11-25: 290 mg via INTRAVENOUS
  Filled 2021-11-25: qty 29

## 2021-11-25 MED ORDER — PALONOSETRON HCL INJECTION 0.25 MG/5ML
0.2500 mg | Freq: Once | INTRAVENOUS | Status: AC
  Administered 2021-11-25: 0.25 mg via INTRAVENOUS

## 2021-11-25 MED ORDER — SODIUM CHLORIDE 0.9 % IV SOLN
10.0000 mg | Freq: Once | INTRAVENOUS | Status: AC
  Administered 2021-11-25: 10 mg via INTRAVENOUS
  Filled 2021-11-25: qty 10

## 2021-11-25 MED ORDER — SODIUM CHLORIDE 0.9 % IV SOLN
150.0000 mg | Freq: Once | INTRAVENOUS | Status: AC
  Administered 2021-11-25: 150 mg via INTRAVENOUS
  Filled 2021-11-25: qty 150

## 2021-11-25 MED ORDER — SODIUM CHLORIDE 0.9 % IV SOLN: Freq: Once | INTRAVENOUS | Status: AC

## 2021-11-25 NOTE — Assessment & Plan Note (Signed)
This is caused by treatment I will reduce her AUC a little bit She is warned about risk of infection

## 2021-11-25 NOTE — Progress Notes (Signed)
Per Dr. Alvy Bimler - okay to proceed with low ANC.

## 2021-11-25 NOTE — Progress Notes (Signed)
  Radiation Oncology         (336) [phone] ________________________________  Name: Sophia Santos MRN: [id number]  Date: 11/26/2021  DOB: 19-Apr-1934  CC: Cassandria Anger, MD  Everitt Amber, MD  HDR BRACHYTHERAPY NOTE  DIAGNOSIS: Stage IIIA grade 2 endometrial cancer; endometrial adenocarcinoma    Simple treatment device note: Patient had construction of her custom vaginal cylinder. She will be treated with a 3.0 cm diameter segmented cylinder. This conforms to her anatomy without undue discomfort.  Vaginal brachytherapy procedure node: The patient was brought to the [place] suite. Identity was confirmed. All relevant records and images related to the planned course of therapy were reviewed. The patient freely provided informed written consent to proceed with treatment after reviewing the details related to the planned course of therapy. The consent form was witnessed and verified by the simulation staff. Then, the patient was set-up in a stable reproducible supine position for radiation therapy. Pelvic exam revealed the vaginal cuff to be intact . The patient's custom vaginal cylinder was placed in the proximal vagina. This was affixed to the CT/MR stabilization plate to prevent slippage. Patient tolerated the placement well.  Verification simulation note:  A fiducial marker was placed within the vaginal cylinder. An AP and lateral film was then obtained through the pelvis area. This documented accurate position of the vaginal cylinder for treatment.  HDR BRACHYTHERAPY TREATMENT  The remote afterloading device was affixed to the vaginal cylinder by catheter. Patient then proceeded to undergo her second high-dose-rate treatment directed at the proximal vagina. The patient was prescribed a dose of 6.0 gray to be delivered to the mucosal surface. Treatment length was 3.0 cm. Patient was treated with 1 channel using 7 dwell positions. Treatment time was 201.3 seconds. Iridium 192 was the high-dose-rate  source for treatment. The patient tolerated the treatment well. After completion of her therapy, a radiation survey was performed documenting return of the iridium source into the Gamma[employer] safe.   PLAN: The patient will return later week for her third high-dose-rate treatment. ________________________________  -----------------------------------  Blair Promise, PhD, MD  This document serves as a record of services personally performed by Gery Pray, MD. It was created on his behalf by Roney Mans, a trained medical scribe. The creation of this record is based on the scribe's personal observations and the provider's statements to them. This document has been checked and approved by the attending provider.

## 2021-11-25 NOTE — Progress Notes (Signed)
[place] OFFICE PROGRESS NOTE  Patient Care Team: Plotnikov, Evie Lacks, MD as PCP - Yehuda Savannah, MD (Obstetrics and Gynecology) Ralene Ok, MD as Consulting Physician (General Surgery) Milus Banister, MD as Attending Physician (Gastroenterology) Awanda Mink Craige Cotta, RN as Oncology Nurse Navigator (Oncology)  ASSESSMENT & PLAN:  Endometrial adenocarcinoma [facility]) Her treatment course is complicated by significant pancytopenia She is not symptomatic We discussed options including delaying her treatment by 1 week or proceed with treatment today and delay the next treatment by 1 week Ultimately, she is in agreement to proceed with treatment as scheduled I will reduce the dose of her treatment and give her an additional week of break after today's treatment   Pancytopenia, acquired [facility]) This is caused by treatment I will reduce her AUC a little bit She is warned about risk of infection  No orders of the defined types were placed in this encounter.   All questions were answered. The patient knows to call the clinic with any problems, questions or concerns. The total time spent in the appointment was 30 minutes encounter with patients including review of chart and various tests results, discussions about plan of care and coordination of care plan   Heath Lark, MD 11/25/2021 2:04 PM  INTERVAL HISTORY: Please see below for problem oriented charting. she returns for treatment follow-up seen prior to cycle 3 of carboplatin for adjuvant treatment of uterine cancer Interpreter is present She recently had accidental injury at home and hit her head causing some minor bruising Her energy level is fair No recent infection, fever or chills  REVIEW OF SYSTEMS:   Constitutional: Denies fevers, chills or abnormal weight loss Eyes: Denies blurriness of vision Ears, nose, mouth, throat, and face: Denies mucositis or sore throat Respiratory: Denies cough, dyspnea or  wheezes Cardiovascular: Denies palpitation, chest discomfort or lower extremity swelling Gastrointestinal:  Denies nausea, heartburn or change in bowel habits Skin: Denies abnormal skin rashes Lymphatics: Denies new lymphadenopathy  Neurological:Denies numbness, tingling or new weaknesses Behavioral/Psych: Mood is stable, no new changes  All other systems were reviewed with the patient and are negative.  I have reviewed the past medical history, past surgical history, social history and family history with the patient and they are unchanged from previous note.  ALLERGIES:  is allergic to fosamax [alendronate sodium], lipitor [atorvastatin], voltaren [diclofenac sodium], and zocor [simvastatin].  MEDICATIONS:  Current Outpatient Medications  Medication Sig Dispense Refill   acetaminophen (TYLENOL) 325 MG tablet Take 352 mg by mouth every 6 (six) hours as needed for mild pain or moderate pain.     ALPRAZolam (XANAX) 1 MG tablet Take 0.5-1 tablets (0.5-1 mg total) by mouth at bedtime as needed for anxiety or sleep. 90 tablet 1   amLODipine (NORVASC) 5 MG tablet Take 1 tablet (5 mg total) by mouth daily. 90 tablet 3   captopril (CAPOTEN) 50 MG tablet TAKE 1 TABLET BY MOUTH TWICE A DAY 180 tablet 3   Cholecalciferol 1000 UNITS tablet Take 1,000 Units by mouth daily. Vitamin d     Coenzyme Q10 (CO Q-10) 100 MG CAPS Take by mouth daily.     ezetimibe (ZETIA) 10 MG tablet TAKE 1 TABLET BY MOUTH EVERY DAY 90 tablet 3   MYRBETRIQ 25 MG TB24 tablet Take 1 tablet (25 mg total) by mouth daily. (Patient taking differently: Take 25 mg by mouth at bedtime.) 90 tablet 3   omeprazole (PRILOSEC) 20 MG capsule Take 2 capsules (40 mg total) by  mouth daily. Annual appt due in March must see provider for future refills (Patient taking differently: Take 20 mg by mouth daily. Prn at hs also) 180 capsule 3   ondansetron (ZOFRAN) 8 MG tablet Take 1 tablet (8 mg total) by mouth 2 (two) times daily as needed for  refractory nausea / vomiting. Start on day 3 after carboplatin chemo. (Patient not taking: Reported on 11/20/2021) 30 tablet 1   Pancrelipase, Lip-Prot-Amyl, (CREON) 24000-76000 units CPEP TAKE ONE CAPSULE BY MOUTH 3 TIMES A DAY WITH FOOD (Patient taking differently: as needed. TAKE ONE CAPSULE BY MOUTH 3 TIMES A DAY WITH FOOD) 270 capsule 3   polyethylene glycol powder (GAVILAX) 17 GM/SCOOP powder MIX 1 CAPFUL (17 GRAMS) AS DIRECTED AND DRINK TWICE A DAY AS NEEDED FOR MODERATE CONSTIPATION 510 g 5   prochlorperazine (COMPAZINE) 10 MG tablet Take 1 tablet (10 mg total) by mouth every 6 (six) hours as needed (Nausea or vomiting). (Patient not taking: Reported on 11/20/2021) 30 tablet 1   propranolol ER (INDERAL LA) 120 MG 24 hr capsule Take 1 capsule (120 mg total) by mouth daily. 90 capsule 3   RESTASIS 0.05 % ophthalmic emulsion Place 1 drop into both eyes 2 (two) times daily.      Vitamin A 2400 MCG (8000 UT) CAPS Take 2,400 mcg by mouth daily.     No current facility-administered medications for this visit.   Facility-Administered Medications Ordered in Other Visits  Medication Dose Route Frequency Provider Last Rate Last Admin   CARBOplatin (PARAPLATIN) 290 mg in sodium chloride 0.9 % 100 mL chemo infusion  290 mg Intravenous Once Heath Lark, MD        SUMMARY OF ONCOLOGIC HISTORY: Oncology History Overview Note  Endometrioid FIGO grade 1-2, MSI high   Endometrial adenocarcinoma ([place])  07/19/2021 Imaging   US pelvis 1. Endometrial thickness of 5.6 mm. In the setting of post-menopausal bleeding, endometrial sampling is indicated to exclude carcinoma.  2. Densely calcified shadowing mass within the right uterus presumably representing a calcified fibroid.   07/31/2021 Pathology Results   SURGICAL PATHOLOGY   FINAL MICROSCOPIC DIAGNOSIS:   A. ENDOMETRIUM, BIOPSY:  - Endometrial adenocarcinoma, FIGO grade 1.  See comment  SUMMARY INTERPRETATION: ABNORMAL   There is loss of the major  and minor MMR proteins MLH1 and PMS2. The loss of expression may be secondary to promoter hyper-methylation, gene mutation or other genetic event. BRAF mutation testing and/or MLH1 methylation testing is indicated. The presence of a BRAF mutation and/or MLH1 hypermethylation is indicative of a sporadic-type tumor. The absence of either BRAF mutation and/or presence of normal methylation indicate the possible presence of a hereditary germline mutation (e.g. Lynch syndrome) and referral to genetic counseling is warranted.    08/05/2021 Initial Diagnosis   Endometrial adenocarcinoma ([place])   08/21/2021 Pathology Results   FINAL MICROSCOPIC DIAGNOSIS:   A. LYMPH NODE, SENTINEL, RIGHT OBTURATOR, EXCISION:  - Lymph node, negative for carcinoma (0/1)   B. LYMPH NODE, SENTINEL, LEFT OBTURATOR, EXCISION:  - Lymph node, negative for carcinoma (0/1)   C. UTERUS, CERVIX, BILATERAL FALLOPIAN TUBES AND OVARIES:  - Endometrioid adenocarcinoma, FIGO grade 2  - Carcinoma invades through the entire myometrium and focally involves  the serosal surface  - Benign unremarkable cervix  - Benign bilateral fallopian tubes and ovaries  - See oncology table  - See comment   COMMENT:   C.  Tumor showed areas with clear cell morphology but immunostains for Napsin A and P504S do  not show evidence of clear cell carcinoma.  Also, there is significant tumor necrosis, but the viable portion of tumor appears to be FIGO grade 2.  Dr. Tresa Moore reviewed the select slides from the case and concurs with presence of serosal invasion.    ONCOLOGY TABLE:   UTERUS, CARCINOMA OR CARCINOSARCOMA: Resection   Procedure: Total hysterectomy and bilateral salpingo-oophorectomy  Histologic Type: Endometrioid adenocarcinoma  Histologic Grade: FIGO grade 1  Myometrial Invasion:       Depth of Myometrial Invasion (mm): 17 mm       Myometrial Thickness (mm): 17 mm       Percentage of Myometrial Invasion: 100%  Uterine Serosa Involvement:  Present, focal  Cervical stromal Involvement: Not identified  Extent of involvement of other tissue/organs: Not identified  Peritoneal/Ascitic Fluid: Not applicable  Lymphovascular Invasion: Not identified  Regional Lymph Nodes:       Pelvic Lymph Nodes Examined:                                   2 Sentinel                                   0 Non-sentinel                                   2 Total       Pelvic Lymph Nodes with Metastasis: 0       Para-aortic Lymph Nodes Examined:                                    0 Sentinel                                    0 Non-sentinel                                    0 Total       Para-aortic Lymph Nodes with Metastasis: 0  Distant Metastasis:       Distant Site(s) Involved: Not applicable  Pathologic Stage Classification (pTNM, AJCC 8th Edition): pT3a, pN0  Ancillary Studies: MMR / MSI testing will be ordered  Representative Tumor Block: C10  Comment(s): Pancytokeratin was performed on the lymph nodes and is negative.    08/21/2021 Surgery   Surgeon: Donaciano Eva    Operation: Robotic-assisted laparoscopic total hysterectomy with bilateral salpingoophorectomy, SLN biopsy     Operative Findings:  : 6cm uterus with anterior fibroid (approx 4cm). Normal appearing tubes and ovaries. No suspicious nodes. Bilateral mapping.     10/03/2021 Cancer Staging   Staging form: Corpus Uteri - Carcinoma and Carcinosarcoma, AJCC 8th Edition - Clinical stage from 10/03/2021: FIGO Stage III (cT3, cN0, cM0) - Signed by Heath Lark, MD on 10/03/2021 Stage prefix: Initial diagnosis    10/14/2021 -  Chemotherapy   Patient is on Treatment Plan : Uterine Carboplatin (AUC 5) 21d       PHYSICAL EXAMINATION: ECOG PERFORMANCE STATUS: 2 - Symptomatic, <50% confined to bed  Vitals:   11/25/21 1217  BP:  140/68  Pulse: 65  Resp: 18  Temp: 97.9 F (36.6 C)  SpO2: 100%   Filed Weights   11/25/21 1217  Weight: 134 lb 14.4 oz (61.2 kg)     GENERAL:alert, no distress and comfortable SKIN: Noted minor skin bruises EYES: normal, Conjunctiva are pink and non-injected, sclera clear OROPHARYNX:no exudate, no erythema and lips, buccal mucosa, and tongue normal  NECK: supple, thyroid normal size, non-tender, without nodularity LYMPH:  no palpable lymphadenopathy in the cervical, axillary or inguinal LUNGS: clear to auscultation and percussion with normal breathing effort HEART: regular rate & rhythm and no murmurs and no lower extremity edema ABDOMEN:abdomen soft, non-tender and normal bowel sounds Musculoskeletal:no cyanosis of digits and no clubbing  NEURO: alert & oriented x 3 with fluent speech, no focal motor/sensory deficits  LABORATORY DATA:  I have reviewed the data as listed    Component Value Date/Time   NA 135 11/25/2021 1147   K 3.9 11/25/2021 1147   CL 103 11/25/2021 1147   CO2 24 11/25/2021 1147   GLUCOSE 106 (H) 11/25/2021 1147   GLUCOSE 96 11/16/2006 1058   BUN 26 (H) 11/25/2021 1147   CREATININE 0.76 11/25/2021 1147   CALCIUM 8.8 (L) 11/25/2021 1147   PROT 7.0 10/03/2021 1236   ALBUMIN 3.9 10/03/2021 1236   AST 23 10/03/2021 1236   ALT 30 10/03/2021 1236   ALKPHOS 139 (H) 10/03/2021 1236   BILITOT 0.6 10/03/2021 1236   GFRNONAA >60 11/25/2021 1147   GFRAA >60 09/13/2020 0800    No results found for: SPEP, UPEP  Lab Results  Component Value Date   WBC 2.9 (L) 11/25/2021   NEUTROABS 0.8 (L) 11/25/2021   HGB 11.0 (L) 11/25/2021   HCT 32.7 (L) 11/25/2021   MCV 93.4 11/25/2021   PLT 163 11/25/2021      Chemistry      Component Value Date/Time   NA 135 11/25/2021 1147   K 3.9 11/25/2021 1147   CL 103 11/25/2021 1147   CO2 24 11/25/2021 1147   BUN 26 (H) 11/25/2021 1147   CREATININE 0.76 11/25/2021 1147      Component Value Date/Time   CALCIUM 8.8 (L) 11/25/2021 1147   ALKPHOS 139 (H) 10/03/2021 1236   AST 23 10/03/2021 1236   ALT 30 10/03/2021 1236   BILITOT 0.6 10/03/2021 1236

## 2021-11-25 NOTE — Telephone Encounter (Signed)
CALLED PATIENT TO REMIND OF HDR TX. FOR 11-26-21 @ 1 PM, LVM FOR A RETURN CALL

## 2021-11-25 NOTE — Patient Instructions (Signed)
[facility] MEDICAL ONCOLOGY  Discharge Instructions: Thank you for choosing [facility] to provide your oncology and hematology care.   If you have a lab appointment with the Cancer Center, please go directly to the Cancer Center and check in at the registration area.   Wear comfortable clothing and clothing appropriate for easy access to any Portacath or PICC line.   We strive to give you quality time with your provider. You may need to reschedule your appointment if you arrive late (15 or more minutes).  Arriving late affects you and other patients whose appointments are after yours.  Also, if you miss three or more appointments without notifying the office, you may be dismissed from the clinic at the provider's discretion.      For prescription refill requests, have your pharmacy contact our office and allow 72 hours for refills to be completed.    Today you received the following chemotherapy and/or immunotherapy agents carboplatin      To help prevent nausea and vomiting after your treatment, we encourage you to take your nausea medication as directed.  BELOW ARE SYMPTOMS THAT SHOULD BE REPORTED IMMEDIATELY: *FEVER GREATER THAN 100.4 F (38 C) OR HIGHER *CHILLS OR SWEATING *NAUSEA AND VOMITING THAT IS NOT CONTROLLED WITH YOUR NAUSEA MEDICATION *UNUSUAL SHORTNESS OF BREATH *UNUSUAL BRUISING OR BLEEDING *URINARY PROBLEMS (pain or burning when urinating, or frequent urination) *BOWEL PROBLEMS (unusual diarrhea, constipation, pain near the anus) TENDERNESS IN MOUTH AND THROAT WITH OR WITHOUT PRESENCE OF ULCERS (sore throat, sores in mouth, or a toothache) UNUSUAL RASH, SWELLING OR PAIN  UNUSUAL VAGINAL DISCHARGE OR ITCHING   Items with * indicate a potential emergency and should be followed up as soon as possible or go to the Emergency Department if any problems should occur.  Please show the CHEMOTHERAPY ALERT CARD or IMMUNOTHERAPY ALERT CARD at check-in to  the Emergency Department and triage nurse.  Should you have questions after your visit or need to cancel or reschedule your appointment, please contact [facility] MEDICAL ONCOLOGY  Dept: [phone]  and follow the prompts.  Office hours are 8:00 a.m. to 4:30 p.m. Monday - Friday. Please note that voicemails left after 4:00 p.m. may not be returned until the following business day.  We are closed weekends and major holidays. You have access to a nurse at all times for urgent questions. Please call the main number to the clinic Dept: [phone] and follow the prompts.   For any non-urgent questions, you may also contact your provider using MyChart. We now offer e-Visits for anyone 18 and older to request care online for non-urgent symptoms. For details visit [email or web address].   Also download the MyChart app! Go to the app store, search "MyChart", open the app, select [facility], and log in with your MyChart username and password.  Due to Covid, a mask is required upon entering the hospital/clinic. If you do not have a mask, one will be given to you upon arrival. For doctor visits, patients may have 1 support person aged 18 or older with them. For treatment visits, patients cannot have anyone with them due to current Covid guidelines and our immunocompromised population.   

## 2021-11-25 NOTE — Assessment & Plan Note (Signed)
Her treatment course is complicated by significant pancytopenia She is not symptomatic We discussed options including delaying her treatment by 1 week or proceed with treatment today and delay the next treatment by 1 week Ultimately, she is in agreement to proceed with treatment as scheduled I will reduce the dose of her treatment and give her an additional week of break after today's treatment

## 2021-11-26 ENCOUNTER — Other Ambulatory Visit: Payer: Self-pay

## 2021-11-26 ENCOUNTER — Ambulatory Visit
Admission: RE | Admit: 2021-11-26 | Discharge: 2021-11-26 | Disposition: A | Discharge status: Home / Self Care | Payer: Medicare Other | Source: Ambulatory Visit | Attending: Radiation Oncology | Admitting: Radiation Oncology

## 2021-11-26 DIAGNOSIS — C541 Malignant neoplasm of endometrium: Secondary | ICD-10-CM | POA: Diagnosis not present

## 2021-11-27 ENCOUNTER — Ambulatory Visit: Payer: Medicare Other | Admitting: Radiation Oncology

## 2021-11-27 ENCOUNTER — Telehealth: Payer: Self-pay | Admitting: *Deleted

## 2021-11-27 NOTE — Telephone Encounter (Signed)
CALLED PATIENT TO REMIND OF HDR TX. FOR 11-28-21 @ 11 AM, SPOKE WITH PATIENT AND SHE IS AWARE OF THIS APPT.

## 2021-11-27 NOTE — Progress Notes (Signed)
°  Radiation Oncology         [phone] ________________________________  Name: Sophia Santos MRN: [id number]  Date: 11/28/2021  DOB: November 17, 1934  CC: Cassandria Anger, MD  Everitt Amber, MD  HDR BRACHYTHERAPY NOTE  DIAGNOSIS: Stage IIIA grade 2 endometrial cancer; endometrial adenocarcinoma    Simple treatment device note: Patient had construction of her custom vaginal cylinder. She will be treated with a 3.0 cm diameter segmented cylinder. This conforms to her anatomy without undue discomfort.  Vaginal brachytherapy procedure node: The patient was brought to the [place] suite. Identity was confirmed. All relevant records and images related to the planned course of therapy were reviewed. The patient freely provided informed written consent to proceed with treatment after reviewing the details related to the planned course of therapy. The consent form was witnessed and verified by the simulation staff. Then, the patient was set-up in a stable reproducible supine position for radiation therapy. Pelvic exam revealed the vaginal cuff to be intact . The patient's custom vaginal cylinder was placed in the proximal vagina. This was affixed to the CT/MR stabilization plate to prevent slippage. Patient tolerated the placement well.  Verification simulation note:  A fiducial marker was placed within the vaginal cylinder. An AP and lateral film was then obtained through the pelvis area. This documented accurate position of the vaginal cylinder for treatment.  HDR BRACHYTHERAPY TREATMENT  The remote afterloading device was affixed to the vaginal cylinder by catheter. Patient then proceeded to undergo her third high-dose-rate treatment directed at the proximal vagina. The patient was prescribed a dose of 6.0 gray to be delivered to the mucosal surface. Treatment length was 3.0 cm. Patient was treated with 1 channel using 7 dwell positions. Treatment time was 205.0 seconds. Iridium 192 was the high-dose-rate  source for treatment. The patient tolerated the treatment well. After completion of her therapy, a radiation survey was performed documenting return of the iridium source into the [employer] safe.   PLAN: The patient will return the last week in December for her fourth high-dose-rate treatment. ________________________________  -----------------------------------  Blair Promise, PhD, MD  This document serves as a record of services personally performed by Gery Pray, MD. It was created on his behalf by Roney Mans, a trained medical scribe. The creation of this record is based on the scribe's personal observations and the provider's statements to them. This document has been checked and approved by the attending provider.

## 2021-11-28 ENCOUNTER — Other Ambulatory Visit: Payer: Self-pay

## 2021-11-28 ENCOUNTER — Ambulatory Visit
Admission: RE | Admit: 2021-11-28 | Discharge: 2021-11-28 | Disposition: A | Discharge status: Home / Self Care | Payer: Medicare Other | Source: Ambulatory Visit | Attending: Radiation Oncology | Admitting: Radiation Oncology

## 2021-11-28 DIAGNOSIS — C541 Malignant neoplasm of endometrium: Secondary | ICD-10-CM

## 2021-11-28 LAB — CBC WITH DIFFERENTIAL (CANCER CENTER ONLY)
Abs Immature Granulocytes: 0 10*3/uL (ref 0.00–0.07)
Basophils Absolute: 0 10*3/uL (ref 0.0–0.1)
Basophils Relative: 0 %
Eosinophils Absolute: 0 10*3/uL (ref 0.0–0.5)
Eosinophils Relative: 1 %
HCT: 32.5 % — ABNORMAL LOW (ref 36.0–46.0)
Hemoglobin: 10.8 g/dL — ABNORMAL LOW (ref 12.0–15.0)
Immature Granulocytes: 0 %
Lymphocytes Relative: 57 %
Lymphs Abs: 1.6 10*3/uL (ref 0.7–4.0)
MCH: 31.5 pg (ref 26.0–34.0)
MCHC: 33.2 g/dL (ref 30.0–36.0)
MCV: 94.8 fL (ref 80.0–100.0)
Monocytes Absolute: 0.4 10*3/uL (ref 0.1–1.0)
Monocytes Relative: 13 %
Neutro Abs: 0.8 10*3/uL — ABNORMAL LOW (ref 1.7–7.7)
Neutrophils Relative %: 29 %
Platelet Count: 164 10*3/uL (ref 150–400)
RBC: 3.43 MIL/uL — ABNORMAL LOW (ref 3.87–5.11)
RDW: 15.5 % (ref 11.5–15.5)
WBC Count: 2.9 10*3/uL — ABNORMAL LOW (ref 4.0–10.5)
nRBC: 0 % (ref 0.0–0.2)

## 2021-11-28 LAB — BASIC METABOLIC PANEL - CANCER CENTER ONLY
Anion gap: 7 (ref 5–15)
BUN: 20 mg/dL (ref 8–23)
CO2: 27 mmol/L (ref 22–32)
Calcium: 8.7 mg/dL — ABNORMAL LOW (ref 8.9–10.3)
Chloride: 103 mmol/L (ref 98–111)
Creatinine: 0.76 mg/dL (ref 0.44–1.00)
GFR, Estimated: >60 mL/min (ref >60)
Glucose, Bld: 112 mg/dL — ABNORMAL HIGH (ref 70–99)
Potassium: 4.3 mmol/L (ref 3.5–5.1)
Sodium: 137 mmol/L (ref 135–145)

## 2021-12-04 ENCOUNTER — Ambulatory Visit: Payer: Medicare Other | Admitting: Radiation Oncology

## 2021-12-10 ENCOUNTER — Telehealth: Payer: Self-pay | Admitting: *Deleted

## 2021-12-10 ENCOUNTER — Other Ambulatory Visit: Payer: Self-pay

## 2021-12-10 ENCOUNTER — Ambulatory Visit (INDEPENDENT_AMBULATORY_CARE_PROVIDER_SITE_OTHER): Payer: Medicare Other | Admitting: Internal Medicine

## 2021-12-10 ENCOUNTER — Encounter: Payer: Self-pay | Admitting: Internal Medicine

## 2021-12-10 DIAGNOSIS — C541 Malignant neoplasm of endometrium: Secondary | ICD-10-CM | POA: Diagnosis not present

## 2021-12-10 DIAGNOSIS — D61818 Other pancytopenia: Secondary | ICD-10-CM | POA: Diagnosis not present

## 2021-12-10 DIAGNOSIS — Z45819 Encounter for adjustment or removal of unspecified breast implant: Secondary | ICD-10-CM

## 2021-12-10 DIAGNOSIS — F4321 Adjustment disorder with depressed mood: Secondary | ICD-10-CM | POA: Diagnosis not present

## 2021-12-10 DIAGNOSIS — N3281 Overactive bladder: Secondary | ICD-10-CM | POA: Diagnosis not present

## 2021-12-10 DIAGNOSIS — G4700 Insomnia, unspecified: Secondary | ICD-10-CM | POA: Diagnosis not present

## 2021-12-10 DIAGNOSIS — F411 Generalized anxiety disorder: Secondary | ICD-10-CM

## 2021-12-10 LAB — CBC WITH DIFFERENTIAL/PLATELET
Basophils Absolute: 0 10*3/uL (ref 0.0–0.1)
Basophils Relative: 0.3 % (ref 0.0–3.0)
Eosinophils Absolute: 0 10*3/uL (ref 0.0–0.7)
Eosinophils Relative: 1 % (ref 0.0–5.0)
HCT: 31.5 % — ABNORMAL LOW (ref 36.0–46.0)
Hemoglobin: 10.6 g/dL — ABNORMAL LOW (ref 12.0–15.0)
Lymphocytes Relative: 47.8 % — ABNORMAL HIGH (ref 12.0–46.0)
Lymphs Abs: 1.7 10*3/uL (ref 0.7–4.0)
MCHC: 33.5 g/dL (ref 30.0–36.0)
MCV: 95.1 fl (ref 78.0–100.0)
Monocytes Absolute: 0.6 10*3/uL (ref 0.1–1.0)
Monocytes Relative: 16.3 % — ABNORMAL HIGH (ref 3.0–12.0)
Neutro Abs: 1.2 10*3/uL — ABNORMAL LOW (ref 1.4–7.7)
Neutrophils Relative %: 34.6 % — ABNORMAL LOW (ref 43.0–77.0)
Platelets: 212 10*3/uL (ref 150.0–400.0)
RBC: 3.31 Mil/uL — ABNORMAL LOW (ref 3.87–5.11)
RDW: 17.2 % — ABNORMAL HIGH (ref 11.5–15.5)
WBC: 3.6 10*3/uL — ABNORMAL LOW (ref 4.0–10.5)

## 2021-12-10 LAB — COMPREHENSIVE METABOLIC PANEL
ALT: 37 U/L — ABNORMAL HIGH (ref 0–35)
AST: 25 U/L (ref 0–37)
Albumin: 3.9 g/dL (ref 3.5–5.2)
Alkaline Phosphatase: 118 U/L — ABNORMAL HIGH (ref 39–117)
BUN: 20 mg/dL (ref 6–23)
CO2: 27 mEq/L (ref 19–32)
Calcium: 9 mg/dL (ref 8.4–10.5)
Chloride: 100 mEq/L (ref 96–112)
Creatinine, Ser: 0.65 mg/dL (ref 0.40–1.20)
GFR: 80.1 mL/min (ref >60.00)
Glucose, Bld: 120 mg/dL — ABNORMAL HIGH (ref 70–99)
Potassium: 4 mEq/L (ref 3.5–5.1)
Sodium: 135 mEq/L (ref 135–145)
Total Bilirubin: 0.3 mg/dL (ref 0.2–1.2)
Total Protein: 6.6 g/dL (ref 6.0–8.3)

## 2021-12-10 MED ORDER — MIRABEGRON ER 50 MG PO TB24: 50.0000 mg | ORAL_TABLET | Freq: Every day | ORAL | 3 refills | Status: DC

## 2021-12-10 NOTE — Progress Notes (Signed)
°  Radiation Oncology         ([phone]) [phone] ________________________________  Name: Sophia Santos MRN: [id number]  Date: 12/11/2021  DOB: 02-03-1934  CC: Cassandria Anger, MD  Everitt Amber, MD  HDR BRACHYTHERAPY NOTE  DIAGNOSIS: Stage IIIA grade 2 endometrial cancer; endometrial adenocarcinoma    Simple treatment device note: Patient had construction of her custom vaginal cylinder. She will be treated with a 3.0 cm diameter segmented cylinder. This conforms to her anatomy without undue discomfort.  Vaginal brachytherapy procedure node: The patient was brought to the [place] suite. Identity was confirmed. All relevant records and images related to the planned course of therapy were reviewed. The patient freely provided informed written consent to proceed with treatment after reviewing the details related to the planned course of therapy. The consent form was witnessed and verified by the simulation staff. Then, the patient was set-up in a stable reproducible supine position for radiation therapy. Pelvic exam revealed the vaginal cuff to be intact . The patient's custom vaginal cylinder was placed in the proximal vagina. This was affixed to the CT/MR stabilization plate to prevent slippage. Patient tolerated the placement well.  Verification simulation note:  A fiducial marker was placed within the vaginal cylinder. An AP and lateral film was then obtained through the pelvis area. This documented accurate position of the vaginal cylinder for treatment.  HDR BRACHYTHERAPY TREATMENT  The remote afterloading device was affixed to the vaginal cylinder by catheter. Patient then proceeded to undergo her fourth high-dose-rate treatment directed at the proximal vagina. The patient was prescribed a dose of 6.0 gray to be delivered to the mucosal surface. Treatment length was 3.0 cm. Patient was treated with 1 channel using 7 dwell positions. Treatment time was 231.8 seconds. Iridium 192 was the high-dose-rate  source for treatment. The patient tolerated the treatment well. After completion of her therapy, a radiation survey was performed documenting return of the iridium source into the Gamma[employer] safe.   PLAN: The patient will return next week for her fifth high-dose-rate treatment. ________________________________  -----------------------------------  Blair Promise, PhD, MD  This document serves as a record of services personally performed by Gery Pray, MD. It was created on his behalf by Roney Mans, a trained medical scribe. The creation of this record is based on the scribe's personal observations and the provider's statements to them. This document has been checked and approved by the attending provider.

## 2021-12-10 NOTE — Assessment & Plan Note (Addendum)
Chronic - worse Alprazolam prn  Potential benefits of a long term benzodiazepines  use as well as potential risks  and complications were explained to the patient and were aknowledged. Son died in the fall of 03/13/21 Grieving. Discussed

## 2021-12-10 NOTE — Assessment & Plan Note (Addendum)
CBC is being monitored. Check CBC per pt's request

## 2021-12-10 NOTE — Assessment & Plan Note (Signed)
Cont w/Myrbetriq. Will increase the dose

## 2021-12-10 NOTE — Progress Notes (Signed)
Subjective:  [name], female    DOB: 16-Sep-1934  Age: 87 y.o. MRN: [id number]  CC: No chief complaint on file.   HPI Nalda Bargo presents for endometrial cancer: on XRT/chemo. C/o fatigue. Pt fell once on 6 weeks ago; no LOC F/u anxiety, HTN, OAB. Son died in the fall of March 25, 2021. Grieving. C/o OAB sx's  Outpatient Medications Prior to Visit  Medication Sig Dispense Refill   acetaminophen (TYLENOL) 325 MG tablet Take 352 mg by mouth every 6 (six) hours as needed for mild pain or moderate pain.     ALPRAZolam (XANAX) 1 MG tablet Take 0.5-1 tablets (0.5-1 mg total) by mouth at bedtime as needed for anxiety or sleep. 90 tablet 1   amLODipine (NORVASC) 5 MG tablet Take 1 tablet (5 mg total) by mouth daily. 90 tablet 3   captopril (CAPOTEN) 50 MG tablet TAKE 1 TABLET BY MOUTH TWICE A DAY 180 tablet 3   Cholecalciferol 1000 UNITS tablet Take 1,000 Units by mouth daily. Vitamin d     Coenzyme Q10 (CO Q-10) 100 MG CAPS Take by mouth daily.     ezetimibe (ZETIA) 10 MG tablet TAKE 1 TABLET BY MOUTH EVERY DAY 90 tablet 3   omeprazole (PRILOSEC) 20 MG capsule Take 2 capsules (40 mg total) by mouth daily. Annual appt due in 03-26-23 must see provider for future refills (Patient taking differently: Take 20 mg by mouth daily. Prn at hs also) 180 capsule 3   Pancrelipase, Lip-Prot-Amyl, (CREON) 24000-76000 units CPEP TAKE ONE CAPSULE BY MOUTH 3 TIMES A DAY WITH FOOD (Patient taking differently: as needed. TAKE ONE CAPSULE BY MOUTH 3 TIMES A DAY WITH FOOD) 270 capsule 3   polyethylene glycol powder (GAVILAX) 17 GM/SCOOP powder MIX 1 CAPFUL (17 GRAMS) AS DIRECTED AND DRINK TWICE A DAY AS NEEDED FOR MODERATE CONSTIPATION 510 g 5   propranolol ER (INDERAL LA) 120 MG 24 hr capsule Take 1 capsule (120 mg total) by mouth daily. 90 capsule 3   RESTASIS 0.05 % ophthalmic emulsion Place 1 drop into both eyes 2 (two) times daily.      Vitamin A 2400 MCG (8000 UT) CAPS Take 2,400 mcg by mouth daily.      MYRBETRIQ 25 MG TB24 tablet Take 1 tablet (25 mg total) by mouth daily. (Patient taking differently: Take 25 mg by mouth at bedtime.) 90 tablet 3   ondansetron (ZOFRAN) 8 MG tablet Take 1 tablet (8 mg total) by mouth 2 (two) times daily as needed for refractory nausea / vomiting. Start on day 3 after carboplatin chemo. (Patient not taking: Reported on 11/20/2021) 30 tablet 1   prochlorperazine (COMPAZINE) 10 MG tablet Take 1 tablet (10 mg total) by mouth every 6 (six) hours as needed (Nausea or vomiting). (Patient not taking: Reported on 11/20/2021) 30 tablet 1   No facility-administered medications prior to visit.    ROS: Review of Systems  Constitutional:  Negative for activity change, appetite change, chills, fatigue and unexpected weight change.  HENT:  Negative for congestion, mouth sores and sinus pressure.   Eyes:  Negative for visual disturbance.  Respiratory:  Negative for cough and chest tightness.   Gastrointestinal:  Negative for abdominal pain and nausea.  Genitourinary:  Negative for difficulty urinating, frequency and vaginal pain.  Musculoskeletal:  Negative for back pain and gait problem.  Skin:  Negative for pallor and rash.  Neurological:  Positive for weakness. Negative for dizziness, tremors, numbness and headaches.  Psychiatric/Behavioral:  Negative  for confusion and sleep disturbance.    Objective:  BP 140/72 (BP Location: Left Arm, Patient Position: Sitting, Cuff Size: Large)    Pulse 64    Temp 98.1 F (36.7 C) (Oral)    Ht 5' (1.524 m)    Wt 135 lb (61.2 kg)    SpO2 97%    BMI 26.37 kg/m   BP Readings from Last 3 Encounters:  12/10/21 140/72  11/25/21 140/68  11/20/21 (!) 145/76    Wt Readings from Last 3 Encounters:  12/10/21 135 lb (61.2 kg)  11/25/21 134 lb 14.4 oz (61.2 kg)  11/20/21 135 lb (61.2 kg)    Physical Exam Constitutional:      General: She is not in acute distress.    Appearance: She is well-developed.  HENT:     Head: Normocephalic.      Right Ear: External ear normal.     Left Ear: External ear normal.     Nose: Nose normal.  Eyes:     General:        Right eye: No discharge.        Left eye: No discharge.     Conjunctiva/sclera: Conjunctivae normal.     Pupils: Pupils are equal, round, and reactive to light.  Neck:     Thyroid: No thyromegaly.     Vascular: No JVD.     Trachea: No tracheal deviation.  Cardiovascular:     Rate and Rhythm: Normal rate and regular rhythm.     Heart sounds: Normal heart sounds.  Pulmonary:     Effort: No respiratory distress.     Breath sounds: No stridor. No wheezing.  Abdominal:     General: Bowel sounds are normal. There is no distension.     Palpations: Abdomen is soft. There is no mass.     Tenderness: There is no abdominal tenderness. There is no guarding or rebound.  Musculoskeletal:        General: No tenderness.     Cervical back: Normal range of motion and neck supple. No rigidity.  Lymphadenopathy:     Cervical: No cervical adenopathy.  Skin:    Findings: No erythema or rash.  Neurological:     Cranial Nerves: No cranial nerve deficit.     Motor: No abnormal muscle tone.     Coordination: Coordination normal.     Deep Tendon Reflexes: Reflexes normal.  Psychiatric:        Behavior: Behavior normal.        Thought Content: Thought content normal.        Judgment: Judgment normal.  Tearful   Lab Results  Component Value Date   WBC 2.9 (L) 11/28/2021   HGB 10.8 (L) 11/28/2021   HCT 32.5 (L) 11/28/2021   PLT 164 11/28/2021   GLUCOSE 112 (H) 11/28/2021   CHOL 238 (H) 05/06/2021   TRIG 106.0 05/06/2021   HDL 65.10 05/06/2021   LDLDIRECT 143.0 03/02/2018   LDLCALC 152 (H) 05/06/2021   ALT 30 10/03/2021   AST 23 10/03/2021   NA 137 11/28/2021   K 4.3 11/28/2021   CL 103 11/28/2021   CREATININE 0.76 11/28/2021   BUN 20 11/28/2021   CO2 27 11/28/2021   TSH 1.39 05/06/2021   INR 1.0 02/23/2020   HGBA1C 5.9 09/29/2007    No results found.  Assessment  & Plan:   Problem List Items Addressed This Visit     Admission for removal of tissue expander of breast without synchronous insertion  of permanent implant   Anxiety disorder    Chronic - worse Alprazolam prn  Potential benefits of a long term benzodiazepines  use as well as potential risks  and complications were explained to the patient and were aknowledged. Son died in the fall of 03/14/21 Grieving. Discussed      Endometrial adenocarcinoma ([place])    On XRT and chemo      Relevant Orders   Comprehensive metabolic panel   Grief    Son died in the fall of 2021-03-14 Grieving. Discussed      Insomnia    Chronic - worse Alprazolam prn  Potential benefits of a long term benzodiazepines  use as well as potential risks  and complications were explained to the patient and were aknowledged. Son died in the fall of March 14, 2021 Grieving. Discussed      Overactive bladder    Cont w/Myrbetriq. Will increase the dose      Pancytopenia, acquired ([place])    CBC is being monitored. Check CBC per pt's request      Relevant Orders   Comprehensive metabolic panel   CBC with Differential/Platelet      Meds ordered this encounter  Medications   mirabegron ER (MYRBETRIQ) 50 MG TB24 tablet    Sig: Take 1 tablet (50 mg total) by mouth daily.    Dispense:  90 tablet    Refill:  3      Follow-up: Return in about 3 months (around 03/10/2022) for a follow-up visit.  Walker Kehr, MD

## 2021-12-10 NOTE — Assessment & Plan Note (Signed)
Chronic - worse Alprazolam prn  Potential benefits of a long term benzodiazepines  use as well as potential risks  and complications were explained to the patient and were aknowledged. Son died in the fall of 27-Mar-2021 Grieving. Discussed

## 2021-12-10 NOTE — Assessment & Plan Note (Signed)
On XRT and chemo

## 2021-12-10 NOTE — Telephone Encounter (Signed)
CALLED PATIENT TO REMIND OF HDR TX. FOR 12-11-21 @ 1 PM, SPOKE WITH PATIENT AND SHE IS AWARE OF THIS [place].

## 2021-12-10 NOTE — Assessment & Plan Note (Addendum)
Son died in the fall of 2022 Grieving. Discussed 

## 2021-12-11 ENCOUNTER — Ambulatory Visit
Admission: RE | Admit: 2021-12-11 | Discharge: 2021-12-11 | Disposition: A | Discharge status: Home / Self Care | Payer: Medicare Other | Source: Ambulatory Visit | Attending: Radiation Oncology | Admitting: Radiation Oncology

## 2021-12-11 ENCOUNTER — Other Ambulatory Visit: Payer: Self-pay

## 2021-12-11 DIAGNOSIS — C541 Malignant neoplasm of endometrium: Secondary | ICD-10-CM | POA: Diagnosis not present

## 2021-12-16 ENCOUNTER — Encounter: Payer: Self-pay | Admitting: Hematology and Oncology

## 2021-12-17 ENCOUNTER — Telehealth: Payer: Self-pay | Admitting: *Deleted

## 2021-12-17 NOTE — Progress Notes (Signed)
°  Radiation Oncology         [phone] ________________________________  Name: Sophia Santos MRN: [id number]  Date: 12/18/2021  DOB: [birth year]  CC: Plotnikov, Evie Lacks, MD  Everitt Amber, MD  HDR BRACHYTHERAPY NOTE  DIAGNOSIS: Stage IIIA grade 2 endometrial cancer; endometrial adenocarcinoma    Simple treatment device note: Patient had construction of her custom vaginal cylinder. She will be treated with a 3.0 cm diameter segmented cylinder. This conforms to her anatomy without undue discomfort.  Vaginal brachytherapy procedure node: The patient was brought to the [facility]. Identity was confirmed. All relevant records and images related to the planned course of therapy were reviewed. The patient freely provided informed written consent to proceed with treatment after reviewing the details related to the planned course of therapy. The consent form was witnessed and verified by the simulation staff. Then, the patient was set-up in a stable reproducible supine position for radiation therapy. Pelvic exam revealed the vaginal cuff to be intact . The patient's custom vaginal cylinder was placed in the proximal vagina. This was affixed to the CT/MR stabilization plate to prevent slippage. Patient tolerated the placement well.  Verification simulation note:  A fiducial marker was placed within the vaginal cylinder. An AP and lateral film was then obtained through the pelvis area. This documented accurate position of the vaginal cylinder for treatment.  HDR BRACHYTHERAPY TREATMENT  The remote afterloading device was affixed to the vaginal cylinder by catheter. Patient then proceeded to undergo her fifth high-dose-rate treatment directed at the proximal vagina. The patient was prescribed a dose of 6.0 gray to be delivered to the mucosal surface. Treatment length was 3.0 cm. Patient was treated with 1 channel using 7 dwell positions. Treatment time was 247.4 seconds. Iridium 192 was the high-dose-rate  source for treatment. The patient tolerated the treatment well. After completion of her therapy, a radiation survey was performed documenting return of the iridium source into the [employer] safe.   PLAN: The patient has completed her high-dose-rate treatment. She will return in one month for routine follow-up.  ________________________________  -----------------------------------  Blair Promise, PhD, MD  This document serves as a record of services personally performed by Gery Pray, MD. It was created on his behalf by Roney Mans, a trained medical scribe. The creation of this record is based on the scribe's personal observations and the provider's statements to them. This document has been checked and approved by the attending provider.

## 2021-12-17 NOTE — Telephone Encounter (Signed)
CALLED PATIENT TO REMIND OF HDR TX. FOR 12-18-21 @ 1 PM, SPOKE WITH PATIENT AND SHE IS AWARE OF THIS [place].

## 2021-12-18 ENCOUNTER — Ambulatory Visit
Admission: RE | Admit: 2021-12-18 | Discharge: 2021-12-18 | Disposition: A | Discharge status: Home / Self Care | Payer: Medicare Other | Source: Ambulatory Visit | Attending: Radiation Oncology | Admitting: Radiation Oncology

## 2021-12-18 ENCOUNTER — Other Ambulatory Visit: Payer: Self-pay

## 2021-12-18 ENCOUNTER — Encounter: Payer: Self-pay | Admitting: Radiation Oncology

## 2021-12-18 DIAGNOSIS — C541 Malignant neoplasm of endometrium: Secondary | ICD-10-CM | POA: Diagnosis not present

## 2021-12-20 MED FILL — Dexamethasone Sodium Phosphate Inj 100 MG/10ML: INTRAMUSCULAR | Qty: 1 | Status: AC

## 2021-12-20 MED FILL — Fosaprepitant Dimeglumine For IV Infusion 150 MG (Base Eq): INTRAVENOUS | Qty: 5 | Status: AC

## 2021-12-23 ENCOUNTER — Encounter: Payer: Self-pay | Admitting: Hematology and Oncology

## 2021-12-23 ENCOUNTER — Other Ambulatory Visit: Payer: Self-pay

## 2021-12-23 ENCOUNTER — Inpatient Hospital Stay: Payer: Medicare Other | Attending: Gynecologic Oncology

## 2021-12-23 ENCOUNTER — Other Ambulatory Visit: Payer: Self-pay | Admitting: Hematology and Oncology

## 2021-12-23 ENCOUNTER — Inpatient Hospital Stay: Payer: Medicare Other

## 2021-12-23 ENCOUNTER — Inpatient Hospital Stay (HOSPITAL_BASED_OUTPATIENT_CLINIC_OR_DEPARTMENT_OTHER): Payer: Medicare Other | Admitting: Hematology and Oncology

## 2021-12-23 DIAGNOSIS — C541 Malignant neoplasm of endometrium: Secondary | ICD-10-CM

## 2021-12-23 DIAGNOSIS — D61818 Other pancytopenia: Secondary | ICD-10-CM | POA: Diagnosis not present

## 2021-12-23 LAB — BASIC METABOLIC PANEL - CANCER CENTER ONLY
Anion gap: 7 (ref 5–15)
BUN: 23 mg/dL (ref 8–23)
CO2: 26 mmol/L (ref 22–32)
Calcium: 9.2 mg/dL (ref 8.9–10.3)
Chloride: 102 mmol/L (ref 98–111)
Creatinine: 0.71 mg/dL (ref 0.44–1.00)
GFR, Estimated: >60 mL/min (ref >60)
Glucose, Bld: 138 mg/dL — ABNORMAL HIGH (ref 70–99)
Potassium: 3.8 mmol/L (ref 3.5–5.1)
Sodium: 135 mmol/L (ref 135–145)

## 2021-12-23 LAB — CBC WITH DIFFERENTIAL (CANCER CENTER ONLY)
Abs Immature Granulocytes: 0.01 10*3/uL (ref 0.00–0.07)
Basophils Absolute: 0 10*3/uL (ref 0.0–0.1)
Basophils Relative: 1 %
Eosinophils Absolute: 0.1 10*3/uL (ref 0.0–0.5)
Eosinophils Relative: 2 %
HCT: 32.9 % — ABNORMAL LOW (ref 36.0–46.0)
Hemoglobin: 11 g/dL — ABNORMAL LOW (ref 12.0–15.0)
Immature Granulocytes: 0 %
Lymphocytes Relative: 37 %
Lymphs Abs: 1.4 10*3/uL (ref 0.7–4.0)
MCH: 31.9 pg (ref 26.0–34.0)
MCHC: 33.4 g/dL (ref 30.0–36.0)
MCV: 95.4 fL (ref 80.0–100.0)
Monocytes Absolute: 0.4 10*3/uL (ref 0.1–1.0)
Monocytes Relative: 10 %
Neutro Abs: 1.9 10*3/uL (ref 1.7–7.7)
Neutrophils Relative %: 50 %
Platelet Count: 98 10*3/uL — ABNORMAL LOW (ref 150–400)
RBC: 3.45 MIL/uL — ABNORMAL LOW (ref 3.87–5.11)
RDW: 18.5 % — ABNORMAL HIGH (ref 11.5–15.5)
WBC Count: 3.7 10*3/uL — ABNORMAL LOW (ref 4.0–10.5)
nRBC: 0 % (ref 0.0–0.2)

## 2021-12-23 MED ORDER — PALONOSETRON HCL INJECTION 0.25 MG/5ML
0.2500 mg | Freq: Once | INTRAVENOUS | Status: AC
  Administered 2021-12-23: 0.25 mg via INTRAVENOUS
  Filled 2021-12-23: qty 5

## 2021-12-23 MED ORDER — SODIUM CHLORIDE 0.9 % IV SOLN
150.0000 mg | Freq: Once | INTRAVENOUS | Status: AC
  Administered 2021-12-23: 150 mg via INTRAVENOUS
  Filled 2021-12-23: qty 150

## 2021-12-23 MED ORDER — SODIUM CHLORIDE 0.9 % IV SOLN
326.5000 mg | Freq: Once | INTRAVENOUS | Status: AC
  Administered 2021-12-23: 330 mg via INTRAVENOUS
  Filled 2021-12-23: qty 33

## 2021-12-23 MED ORDER — SODIUM CHLORIDE 0.9 % IV SOLN: Freq: Once | INTRAVENOUS | Status: AC

## 2021-12-23 MED ORDER — SODIUM CHLORIDE 0.9 % IV SOLN
10.0000 mg | Freq: Once | INTRAVENOUS | Status: AC
  Administered 2021-12-23: 10 mg via INTRAVENOUS
  Filled 2021-12-23: qty 10

## 2021-12-23 NOTE — Assessment & Plan Note (Signed)
This is caused by treatment We will proceed with treatment with similar dose as before After this treatment today, I will add extra week of break to allow bone marrow recovery She is warned about risk of infection and risk of bleeding

## 2021-12-23 NOTE — Assessment & Plan Note (Signed)
Overall, she tolerated treatment well; her treatment is complicated by worsening pancytopenia likely due to additional side effects from radiation treatment We will proceed with treatment today without delay Recommend 1 extra week of break after today's treatment to allow bone marrow recovery She is in agreement with the recommendation

## 2021-12-23 NOTE — Progress Notes (Signed)
Sophia Santos OFFICE PROGRESS NOTE  Patient Care Team: Plotnikov, Evie Lacks, MD as PCP - Yehuda Savannah, MD (Obstetrics and Gynecology) Ralene Ok, MD as Consulting Physician (General Surgery) Milus Banister, MD as Attending Physician (Gastroenterology) Awanda Mink Craige Cotta, RN as Oncology Nurse Navigator (Oncology)  ASSESSMENT & PLAN:  Endometrial adenocarcinoma ([place]) Overall, she tolerated treatment well; her treatment is complicated by worsening pancytopenia likely due to additional side effects from radiation treatment We will proceed with treatment today without delay Recommend 1 extra week of break after today's treatment to allow bone marrow recovery She is in agreement with the recommendation  Pancytopenia, acquired [facility]) This is caused by treatment We will proceed with treatment with similar dose as before After this treatment today, I will add extra week of break to allow bone marrow recovery She is warned about risk of infection and risk of bleeding  No orders of the defined types were placed in this encounter.   All questions were answered. The patient knows to call the clinic with any problems, questions or concerns. The total time spent in the appointment was 30 minutes encounter with patients including review of chart and various tests results, discussions about plan of care and coordination of care plan   Heath Lark, MD 12/23/2021 6:06 PM  INTERVAL HISTORY: Please see below for problem oriented charting. she returns for treatment follow-up with carboplatin for uterine cancer She is here accompanied by [place] interpreter She tolerated recent treatment well but complain of fatigue No recent signs or symptoms of bleeding  REVIEW OF SYSTEMS:   Constitutional: Denies fevers, chills or abnormal weight loss Eyes: Denies blurriness of vision Ears, nose, mouth, throat, and face: Denies mucositis or sore throat Respiratory: Denies cough, dyspnea or  wheezes Cardiovascular: Denies palpitation, chest discomfort or lower extremity swelling Gastrointestinal:  Denies nausea, heartburn or change in bowel habits Skin: Denies abnormal skin rashes Lymphatics: Denies new lymphadenopathy or easy bruising Neurological:Denies numbness, tingling or new weaknesses Behavioral/Psych: Mood is stable, no new changes  All other systems were reviewed with the patient and are negative.  I have reviewed the past medical history, past surgical history, social history and family history with the patient and they are unchanged from previous note.  ALLERGIES:  is allergic to fosamax [alendronate sodium], lipitor [atorvastatin], voltaren [diclofenac sodium], and zocor [simvastatin].  MEDICATIONS:  Current Outpatient Medications  Medication Sig Dispense Refill   acetaminophen (TYLENOL) 325 MG tablet Take 352 mg by mouth every 6 (six) hours as needed for mild pain or moderate pain.     ALPRAZolam (XANAX) 1 MG tablet Take 0.5-1 tablets (0.5-1 mg total) by mouth at bedtime as needed for anxiety or sleep. 90 tablet 1   amLODipine (NORVASC) 5 MG tablet Take 1 tablet (5 mg total) by mouth daily. 90 tablet 3   captopril (CAPOTEN) 50 MG tablet TAKE 1 TABLET BY MOUTH TWICE A DAY 180 tablet 3   Cholecalciferol 1000 UNITS tablet Take 1,000 Units by mouth daily. Vitamin d     Coenzyme Q10 (CO Q-10) 100 MG CAPS Take by mouth daily.     ezetimibe (ZETIA) 10 MG tablet TAKE 1 TABLET BY MOUTH EVERY DAY 90 tablet 3   mirabegron ER (MYRBETRIQ) 50 MG TB24 tablet Take 1 tablet (50 mg total) by mouth daily. 90 tablet 3   omeprazole (PRILOSEC) 20 MG capsule Take 2 capsules (40 mg total) by mouth daily. Annual appt due in March must see provider for future refills (Patient  taking differently: Take 20 mg by mouth daily. Prn at hs also) 180 capsule 3   Pancrelipase, Lip-Prot-Amyl, (CREON) 24000-76000 units CPEP TAKE ONE CAPSULE BY MOUTH 3 TIMES A DAY WITH FOOD (Patient taking differently:  as needed. TAKE ONE CAPSULE BY MOUTH 3 TIMES A DAY WITH FOOD) 270 capsule 3   polyethylene glycol powder (GAVILAX) 17 GM/SCOOP powder MIX 1 CAPFUL (17 GRAMS) AS DIRECTED AND DRINK TWICE A DAY AS NEEDED FOR MODERATE CONSTIPATION 510 g 5   propranolol ER (INDERAL LA) 120 MG 24 hr capsule Take 1 capsule (120 mg total) by mouth daily. 90 capsule 3   RESTASIS 0.05 % ophthalmic emulsion Place 1 drop into both eyes 2 (two) times daily.      Vitamin A 2400 MCG (8000 UT) CAPS Take 2,400 mcg by mouth daily.     No current facility-administered medications for this visit.    SUMMARY OF ONCOLOGIC HISTORY: Oncology History Overview Note  Endometrioid FIGO grade 1-2, MSI high   Endometrial adenocarcinoma ([place])  07/19/2021 Imaging   US pelvis 1. Endometrial thickness of 5.6 mm. In the setting of post-menopausal bleeding, endometrial sampling is indicated to exclude carcinoma.  2. Densely calcified shadowing mass within the right uterus presumably representing a calcified fibroid.   07/31/2021 Pathology Results   SURGICAL PATHOLOGY   FINAL MICROSCOPIC DIAGNOSIS:   A. ENDOMETRIUM, BIOPSY:  - Endometrial adenocarcinoma, FIGO grade 1.  See comment  SUMMARY INTERPRETATION: ABNORMAL   There is loss of the major and minor MMR proteins MLH1 and PMS2. The loss of expression may be secondary to promoter hyper-methylation, gene mutation or other genetic event. BRAF mutation testing and/or MLH1 methylation testing is indicated. The presence of a BRAF mutation and/or MLH1 hypermethylation is indicative of a sporadic-type tumor. The absence of either BRAF mutation and/or presence of normal methylation indicate the possible presence of a hereditary germline mutation (e.g. Lynch syndrome) and referral to genetic counseling is warranted.    08/05/2021 Initial Diagnosis   Endometrial adenocarcinoma ([place])   08/21/2021 Pathology Results   FINAL MICROSCOPIC DIAGNOSIS:   A. LYMPH NODE, SENTINEL, RIGHT OBTURATOR, EXCISION:   - Lymph node, negative for carcinoma (0/1)   B. LYMPH NODE, SENTINEL, LEFT OBTURATOR, EXCISION:  - Lymph node, negative for carcinoma (0/1)   C. UTERUS, CERVIX, BILATERAL FALLOPIAN TUBES AND OVARIES:  - Endometrioid adenocarcinoma, FIGO grade 2  - Carcinoma invades through the entire myometrium and focally involves  the serosal surface  - Benign unremarkable cervix  - Benign bilateral fallopian tubes and ovaries  - See oncology table  - See comment   COMMENT:   C.  Tumor showed areas with clear cell morphology but immunostains for Napsin A and P504S do not show evidence of clear cell carcinoma.  Also, there is significant tumor necrosis, but the viable portion of tumor appears to be FIGO grade 2.  Dr. Tresa Moore reviewed the select slides from the case and concurs with presence of serosal invasion.    ONCOLOGY TABLE:   UTERUS, CARCINOMA OR CARCINOSARCOMA: Resection   Procedure: Total hysterectomy and bilateral salpingo-oophorectomy  Histologic Type: Endometrioid adenocarcinoma  Histologic Grade: FIGO grade 1  Myometrial Invasion:       Depth of Myometrial Invasion (mm): 17 mm       Myometrial Thickness (mm): 17 mm       Percentage of Myometrial Invasion: 100%  Uterine Serosa Involvement: Present, focal  Cervical stromal Involvement: Not identified  Extent of involvement of other tissue/organs: Not identified  Peritoneal/Ascitic Fluid: Not applicable  Lymphovascular Invasion: Not identified  Regional Lymph Nodes:       Pelvic Lymph Nodes Examined:                                   2 Sentinel                                   0 Non-sentinel                                   2 Total       Pelvic Lymph Nodes with Metastasis: 0       Para-aortic Lymph Nodes Examined:                                    0 Sentinel                                    0 Non-sentinel                                    0 Total       Para-aortic Lymph Nodes with Metastasis: 0  Distant Metastasis:        Distant Site(s) Involved: Not applicable  Pathologic Stage Classification (pTNM, AJCC 8th Edition): pT3a, pN0  Ancillary Studies: MMR / MSI testing will be ordered  Representative Tumor Block: C10  Comment(s): Pancytokeratin was performed on the lymph nodes and is negative.    08/21/2021 Surgery   Surgeon: Donaciano Eva    Operation: Robotic-assisted laparoscopic total hysterectomy with bilateral salpingoophorectomy, SLN biopsy     Operative Findings:  : 6cm uterus with anterior fibroid (approx 4cm). Normal appearing tubes and ovaries. No suspicious nodes. Bilateral mapping.     10/03/2021 Cancer Staging   Staging form: Corpus Uteri - Carcinoma and Carcinosarcoma, AJCC 8th Edition - Clinical stage from 10/03/2021: FIGO Stage III (cT3, cN0, cM0) - Signed by Heath Lark, MD on 10/03/2021 Stage prefix: Initial diagnosis    10/14/2021 -  Chemotherapy   Patient is on Treatment Plan : Uterine Carboplatin (AUC 5) 21d       PHYSICAL EXAMINATION: ECOG PERFORMANCE STATUS: 1 - Symptomatic but completely ambulatory  Vitals:   12/23/21 1121  BP: 138/72  Pulse: 64  Resp: 18  Temp: 97.8 F (36.6 C)  SpO2: 99%   Filed Weights   12/23/21 1121  Weight: 136 lb 9.6 oz (62 kg)    GENERAL:alert, no distress and comfortable NEURO: alert & oriented x 3 with fluent speech, no focal motor/sensory deficits  LABORATORY DATA:  I have reviewed the data as listed    Component Value Date/Time   NA 135 12/23/2021 1100   K 3.8 12/23/2021 1100   CL 102 12/23/2021 1100   CO2 26 12/23/2021 1100   GLUCOSE 138 (H) 12/23/2021 1100   GLUCOSE 96 11/16/2006 1058   BUN 23 12/23/2021 1100   CREATININE 0.71 12/23/2021 1100   CALCIUM 9.2 12/23/2021 1100   PROT 6.6 12/10/2021 1406   ALBUMIN 3.9  12/10/2021 1406   AST 25 12/10/2021 1406   AST 23 10/03/2021 1236   ALT 37 (H) 12/10/2021 1406   ALT 30 10/03/2021 1236   ALKPHOS 118 (H) 12/10/2021 1406   BILITOT 0.3 12/10/2021 1406   BILITOT 0.6  10/03/2021 1236   GFRNONAA >60 12/23/2021 1100   GFRAA >60 09/13/2020 0800    No results found for: SPEP, UPEP  Lab Results  Component Value Date   WBC 3.7 (L) 12/23/2021   NEUTROABS 1.9 12/23/2021   HGB 11.0 (L) 12/23/2021   HCT 32.9 (L) 12/23/2021   MCV 95.4 12/23/2021   PLT 98 (L) 12/23/2021      Chemistry      Component Value Date/Time   NA 135 12/23/2021 1100   K 3.8 12/23/2021 1100   CL 102 12/23/2021 1100   CO2 26 12/23/2021 1100   BUN 23 12/23/2021 1100   CREATININE 0.71 12/23/2021 1100      Component Value Date/Time   CALCIUM 9.2 12/23/2021 1100   ALKPHOS 118 (H) 12/10/2021 1406   AST 25 12/10/2021 1406   AST 23 10/03/2021 1236   ALT 37 (H) 12/10/2021 1406   ALT 30 10/03/2021 1236   BILITOT 0.3 12/10/2021 1406   BILITOT 0.6 10/03/2021 1236

## 2021-12-23 NOTE — Patient Instructions (Signed)
[facility]  Discharge Instructions: Thank you for choosing [facility] to provide your oncology and hematology care.   If you have a lab appointment with the Cancer Center, please go directly to the Cancer Center and check in at the registration area.   Wear comfortable clothing and clothing appropriate for easy access to any Portacath or PICC line.   We strive to give you quality time with your provider. You may need to reschedule your appointment if you arrive late (15 or more minutes).  Arriving late affects you and other patients whose appointments are after yours.  Also, if you miss three or more appointments without notifying the office, you may be dismissed from the clinic at the provider's discretion.      For prescription refill requests, have your pharmacy contact our office and allow 72 hours for refills to be completed.    Today you received the following chemotherapy and/or immunotherapy agents :Carboplatin   To help prevent nausea and vomiting after your treatment, we encourage you to take your nausea medication as directed.  BELOW ARE SYMPTOMS THAT SHOULD BE REPORTED IMMEDIATELY: *FEVER GREATER THAN 100.4 F (38 C) OR HIGHER *CHILLS OR SWEATING *NAUSEA AND VOMITING THAT IS NOT CONTROLLED WITH YOUR NAUSEA MEDICATION *UNUSUAL SHORTNESS OF BREATH *UNUSUAL BRUISING OR BLEEDING *URINARY PROBLEMS (pain or burning when urinating, or frequent urination) *BOWEL PROBLEMS (unusual diarrhea, constipation, pain near the anus) TENDERNESS IN MOUTH AND THROAT WITH OR WITHOUT PRESENCE OF ULCERS (sore throat, sores in mouth, or a toothache) UNUSUAL RASH, SWELLING OR PAIN  UNUSUAL VAGINAL DISCHARGE OR ITCHING   Items with * indicate a potential emergency and should be followed up as soon as possible or go to the Emergency Department if any problems should occur.  Please show the CHEMOTHERAPY ALERT CARD or IMMUNOTHERAPY ALERT CARD at check-in to  the Emergency Department and triage nurse.  Should you have questions after your visit or need to cancel or reschedule your appointment, please contact [facility]  Dept: [phone]  and follow the prompts.  Office hours are 8:00 a.m. to 4:30 p.m. Monday - Friday. Please note that voicemails left after 4:00 p.m. may not be returned until the following business day.  We are closed weekends and major holidays. You have access to a nurse at all times for urgent questions. Please call the main number to the clinic Dept: [phone] and follow the prompts.   For any non-urgent questions, you may also contact your provider using MyChart. We now offer e-Visits for anyone 18 and older to request care online for non-urgent symptoms. For details visit [email or web address].   Also download the MyChart app! Go to the app store, search "MyChart", open the app, select [facility], and log in with your MyChart username and password.  Due to Covid, a mask is required upon entering the hospital/clinic. If you do not have a mask, one will be given to you upon arrival. For doctor visits, patients may have 1 support person aged 18 or older with them. For treatment visits, patients cannot have anyone with them due to current Covid guidelines and our immunocompromised population.   

## 2021-12-26 ENCOUNTER — Encounter: Payer: Self-pay | Admitting: Hematology and Oncology

## 2021-12-27 ENCOUNTER — Other Ambulatory Visit: Payer: Self-pay | Admitting: Gynecologic Oncology

## 2021-12-27 DIAGNOSIS — C541 Malignant neoplasm of endometrium: Secondary | ICD-10-CM

## 2022-01-14 ENCOUNTER — Telehealth: Payer: Self-pay | Admitting: *Deleted

## 2022-01-14 NOTE — Telephone Encounter (Signed)
CALLED PATIENT'S DAUGHTER [place] - [name] TO ALTER FU APPT. ON 01-20-22 DUE TO DR. KINARD BEING IN THE OR, RESCHEDULED FOR 01-30-22 @ 10:30 AM, PATIENT'S DAUGHTER IN[facility] AGREED TO NEW APPT. DATE AND TIME

## 2022-01-17 MED FILL — Dexamethasone Sodium Phosphate Inj 100 MG/10ML: INTRAMUSCULAR | Qty: 1 | Status: AC

## 2022-01-17 MED FILL — Fosaprepitant Dimeglumine For IV Infusion 150 MG (Base Eq): INTRAVENOUS | Qty: 5 | Status: AC

## 2022-01-20 ENCOUNTER — Inpatient Hospital Stay (HOSPITAL_BASED_OUTPATIENT_CLINIC_OR_DEPARTMENT_OTHER): Payer: Medicare Other | Admitting: Hematology and Oncology

## 2022-01-20 ENCOUNTER — Ambulatory Visit: Payer: Self-pay | Admitting: Radiation Oncology

## 2022-01-20 ENCOUNTER — Other Ambulatory Visit: Payer: Self-pay

## 2022-01-20 ENCOUNTER — Inpatient Hospital Stay: Payer: Medicare Other | Attending: Gynecologic Oncology

## 2022-01-20 ENCOUNTER — Other Ambulatory Visit: Payer: Self-pay | Admitting: Hematology and Oncology

## 2022-01-20 ENCOUNTER — Encounter: Payer: Self-pay | Admitting: Hematology and Oncology

## 2022-01-20 ENCOUNTER — Inpatient Hospital Stay: Payer: Medicare Other

## 2022-01-20 DIAGNOSIS — C541 Malignant neoplasm of endometrium: Secondary | ICD-10-CM

## 2022-01-20 DIAGNOSIS — R531 Weakness: Secondary | ICD-10-CM

## 2022-01-20 DIAGNOSIS — I10 Essential (primary) hypertension: Secondary | ICD-10-CM | POA: Diagnosis not present

## 2022-01-20 DIAGNOSIS — D61818 Other pancytopenia: Secondary | ICD-10-CM | POA: Insufficient documentation

## 2022-01-20 LAB — CBC WITH DIFFERENTIAL (CANCER CENTER ONLY)
Abs Immature Granulocytes: 0 10*3/uL (ref 0.00–0.07)
Basophils Absolute: 0 10*3/uL (ref 0.0–0.1)
Basophils Relative: 0 %
Eosinophils Absolute: 0 10*3/uL (ref 0.0–0.5)
Eosinophils Relative: 1 %
HCT: 28.8 % — ABNORMAL LOW (ref 36.0–46.0)
Hemoglobin: 9.7 g/dL — ABNORMAL LOW (ref 12.0–15.0)
Immature Granulocytes: 0 %
Lymphocytes Relative: 49 %
Lymphs Abs: 1.4 10*3/uL (ref 0.7–4.0)
MCH: 34.2 pg — ABNORMAL HIGH (ref 26.0–34.0)
MCHC: 33.7 g/dL (ref 30.0–36.0)
MCV: 101.4 fL — ABNORMAL HIGH (ref 80.0–100.0)
Monocytes Absolute: 0.3 10*3/uL (ref 0.1–1.0)
Monocytes Relative: 12 %
Neutro Abs: 1.1 10*3/uL — ABNORMAL LOW (ref 1.7–7.7)
Neutrophils Relative %: 38 %
Platelet Count: 128 10*3/uL — ABNORMAL LOW (ref 150–400)
RBC: 2.84 MIL/uL — ABNORMAL LOW (ref 3.87–5.11)
RDW: 19.7 % — ABNORMAL HIGH (ref 11.5–15.5)
WBC Count: 2.8 10*3/uL — ABNORMAL LOW (ref 4.0–10.5)
nRBC: 0 % (ref 0.0–0.2)

## 2022-01-20 LAB — BASIC METABOLIC PANEL - CANCER CENTER ONLY
Anion gap: 7 (ref 5–15)
BUN: 24 mg/dL — ABNORMAL HIGH (ref 8–23)
CO2: 27 mmol/L (ref 22–32)
Calcium: 8.9 mg/dL (ref 8.9–10.3)
Chloride: 103 mmol/L (ref 98–111)
Creatinine: 0.72 mg/dL (ref 0.44–1.00)
GFR, Estimated: >60 mL/min (ref >60)
Glucose, Bld: 114 mg/dL — ABNORMAL HIGH (ref 70–99)
Potassium: 3.9 mmol/L (ref 3.5–5.1)
Sodium: 137 mmol/L (ref 135–145)

## 2022-01-20 MED ORDER — PALONOSETRON HCL INJECTION 0.25 MG/5ML
0.2500 mg | Freq: Once | INTRAVENOUS | Status: AC
  Administered 2022-01-20: 0.25 mg via INTRAVENOUS
  Filled 2022-01-20: qty 5

## 2022-01-20 MED ORDER — SODIUM CHLORIDE 0.9 % IV SOLN
10.0000 mg | Freq: Once | INTRAVENOUS | Status: AC
  Administered 2022-01-20: 10 mg via INTRAVENOUS
  Filled 2022-01-20: qty 10

## 2022-01-20 MED ORDER — SODIUM CHLORIDE 0.9 % IV SOLN
150.0000 mg | Freq: Once | INTRAVENOUS | Status: AC
  Administered 2022-01-20: 150 mg via INTRAVENOUS
  Filled 2022-01-20: qty 150

## 2022-01-20 MED ORDER — SODIUM CHLORIDE 0.9 % IV SOLN
326.5000 mg | Freq: Once | INTRAVENOUS | Status: AC
  Administered 2022-01-20: 330 mg via INTRAVENOUS
  Filled 2022-01-20: qty 33

## 2022-01-20 MED ORDER — SODIUM CHLORIDE 0.9 % IV SOLN: Freq: Once | INTRAVENOUS | Status: AC

## 2022-01-20 NOTE — Assessment & Plan Note (Signed)
Her blood pressure is borderline low and she has mild borderline bradycardia I recommend holding of amlodipine and reassess next visit

## 2022-01-20 NOTE — Assessment & Plan Note (Signed)
She has generalized weakness, multifactorial, likely caused by pancytopenia from treatment as well as borderline low blood pressure We will proceed with treatment with modification of her treatment schedule As above, I recommend her to hold off amlodipine

## 2022-01-20 NOTE — Patient Instructions (Signed)
[facility]  Discharge Instructions: Thank you for choosing [facility] to provide your oncology and hematology care.   If you have a lab appointment with the [facility], please go directly to the [facility] and check in at the registration area.   Wear comfortable clothing and clothing appropriate for easy access to any Portacath or PICC line.   We strive to give you quality time with your provider. You may need to reschedule your appointment if you arrive late (15 or more minutes).  Arriving late affects you and other patients whose appointments are after yours.  Also, if you miss three or more appointments without notifying the office, you may be dismissed from the clinic at the providers discretion.      For prescription refill requests, have your pharmacy contact our office and allow 72 hours for refills to be completed.    Today you received the following chemotherapy and/or immunotherapy agents Carboplatin      To help prevent nausea and vomiting after your treatment, we encourage you to take your nausea medication as directed.  BELOW ARE SYMPTOMS THAT SHOULD BE REPORTED IMMEDIATELY: *FEVER GREATER THAN 100.4 F (38 C) OR HIGHER *CHILLS OR SWEATING *NAUSEA AND VOMITING THAT IS NOT CONTROLLED WITH YOUR NAUSEA MEDICATION *UNUSUAL SHORTNESS OF BREATH *UNUSUAL BRUISING OR BLEEDING *URINARY PROBLEMS (pain or burning when urinating, or frequent urination) *BOWEL PROBLEMS (unusual diarrhea, constipation, pain near the anus) TENDERNESS IN MOUTH AND THROAT WITH OR WITHOUT PRESENCE OF ULCERS (sore throat, sores in mouth, or a toothache) UNUSUAL RASH, SWELLING OR PAIN  UNUSUAL VAGINAL DISCHARGE OR ITCHING   Items with * indicate a potential emergency and should be followed up as soon as possible or go to the Emergency Department if any problems should occur.  Please show the CHEMOTHERAPY ALERT CARD or IMMUNOTHERAPY ALERT CARD at check-in to  the Emergency Department and triage nurse.  Should you have questions after your visit or need to cancel or reschedule your appointment, please contact [facility]  Dept: [phone]  and follow the prompts.  Office hours are 8:00 a.m. to 4:30 p.m. Monday - Friday. Please note that voicemails left after 4:00 p.m. may not be returned until the following business day.  We are closed weekends and major holidays. You have access to a nurse at all times for urgent questions. Please call the main number to the clinic Dept: [phone] and follow the prompts.   For any non-urgent questions, you may also contact your provider using MyChart. We now offer e-Visits for anyone 3 and older to request care online for non-urgent symptoms. For details visit [email or web address].   Also download the MyChart app! Go to the app store, search "MyChart", open the app, select [facility], and log in with your MyChart username and password.  Due to Covid, a mask is required upon entering the hospital/clinic. If you do not have a mask, one will be given to you upon arrival. For doctor visits, patients may have 1 support person aged 64 or older with them. For treatment visits, patients cannot have anyone with them due to current Covid guidelines and our immunocompromised population.  [facility]  Discharge Instructions: Thank you for choosing [facility] to provide your oncology and hematology care.   If you have a lab appointment with the [facility], please go directly to the [facility] and check in at the registration area.  Wear comfortable clothing and clothing appropriate for easy access to any Portacath or PICC line.   We strive to give you quality time with your provider. You may need to reschedule your appointment if you arrive late (15 or more minutes).  Arriving late affects you and other patients whose appointments are after  yours.  Also, if you miss three or more appointments without notifying the office, you may be dismissed from the clinic at the providers discretion.      For prescription refill requests, have your pharmacy contact our office and allow 72 hours for refills to be completed.    Today you received the following chemotherapy and/or immunotherapy agents carboplatin      To help prevent nausea and vomiting after your treatment, we encourage you to take your nausea medication as directed.  BELOW ARE SYMPTOMS THAT SHOULD BE REPORTED IMMEDIATELY: *FEVER GREATER THAN 100.4 F (38 C) OR HIGHER *CHILLS OR SWEATING *NAUSEA AND VOMITING THAT IS NOT CONTROLLED WITH YOUR NAUSEA MEDICATION *UNUSUAL SHORTNESS OF BREATH *UNUSUAL BRUISING OR BLEEDING *URINARY PROBLEMS (pain or burning when urinating, or frequent urination) *BOWEL PROBLEMS (unusual diarrhea, constipation, pain near the anus) TENDERNESS IN MOUTH AND THROAT WITH OR WITHOUT PRESENCE OF ULCERS (sore throat, sores in mouth, or a toothache) UNUSUAL RASH, SWELLING OR PAIN  UNUSUAL VAGINAL DISCHARGE OR ITCHING   Items with * indicate a potential emergency and should be followed up as soon as possible or go to the Emergency Department if any problems should occur.  Please show the CHEMOTHERAPY ALERT CARD or IMMUNOTHERAPY ALERT CARD at check-in to the Emergency Department and triage nurse.  Should you have questions after your visit or need to cancel or reschedule your appointment, please contact [facility]  Dept: [phone]  and follow the prompts.  Office hours are 8:00 a.m. to 4:30 p.m. Monday - Friday. Please note that voicemails left after 4:00 p.m. may not be returned until the following business day.  We are closed weekends and major holidays. You have access to a nurse at all times for urgent questions. Please call the main number to the clinic Dept: [phone] and follow the prompts.   For any non-urgent  questions, you may also contact your provider using MyChart. We now offer e-Visits for anyone 29 and older to request care online for non-urgent symptoms. For details visit [email or web address].   Also download the MyChart app! Go to the app store, search "MyChart", open the app, select [facility], and log in with your MyChart username and password.  Due to Covid, a mask is required upon entering the hospital/clinic. If you do not have a mask, one will be given to you upon arrival. For doctor visits, patients may have 1 support person aged 95 or older with them. For treatment visits, patients cannot have anyone with them due to current Covid guidelines and our immunocompromised population.

## 2022-01-20 NOTE — Assessment & Plan Note (Signed)
Overall, she tolerated treatment well; her treatment is complicated by worsening pancytopenia likely due to additional side effects from radiation treatment We will proceed with treatment today without delay She is in agreement with the recommendation

## 2022-01-20 NOTE — Assessment & Plan Note (Signed)
This is caused by treatment We will proceed with treatment with similar dose as before We will continue to space out her treatment to every 4 weeks to allow bone marrow recovery She is warned about risk of infection and risk of bleeding

## 2022-01-20 NOTE — Progress Notes (Signed)
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[facility]) Overall, she tolerated treatment well; her treatment is complicated by worsening pancytopenia likely due to additional side effects from radiation treatment We will proceed with treatment today without delay She is in agreement with the recommendation  Pancytopenia, 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[facility]) This is caused by treatment We will proceed with treatment with similar dose as before We will continue to space out her treatment to every 4 weeks to allow bone marrow recovery She is warned about risk of infection and risk of bleeding  Essential hypertension Her blood pressure is borderline low and she has mild borderline bradycardia I recommend holding of amlodipine and reassess next visit  Generalized weakness She has generalized weakness, multifactorial, likely caused by pancytopenia from treatment as well as borderline low blood pressure We will proceed with treatment with modification of her treatment schedule As above, I recommend her to hold off amlodipine  No orders of the defined types were placed in this encounter.   All questions were answered. The patient knows to call the clinic with any problems, questions or concerns. The total time spent in the appointment was 30 minutes encounter with patients including review of chart and various tests results, discussions about plan of care and coordination of care plan   Heath Lark, MD 01/20/2022 1:00 PM  INTERVAL HISTORY: Please see below for problem oriented charting. she returns for treatment follow-up  [place] interpreter is present She complain of  generalized weakness but denies fall The patient denies any recent signs or symptoms of bleeding such as spontaneous epistaxis, hematuria or hematochezia. Denies recent nausea or changes in bowel habits  REVIEW OF SYSTEMS:   Constitutional: Denies fevers, chills or abnormal weight loss Eyes: Denies blurriness of vision Ears, nose, mouth, throat, and face: Denies mucositis or sore throat Respiratory: Denies cough, dyspnea or wheezes Cardiovascular: Denies palpitation, chest discomfort or lower extremity swelling Gastrointestinal:  Denies nausea, heartburn or change in bowel habits Skin: Denies abnormal skin rashes Lymphatics: Denies new lymphadenopathy or easy bruising Neurological:Denies numbness, tingling or new weaknesses Behavioral/Psych: Mood is stable, no new changes  All other systems were reviewed with the patient and are negative.  I have reviewed the past medical history, past surgical history, social history and family history with the patient and they are unchanged from previous note.  ALLERGIES:  is allergic to fosamax [alendronate sodium], lipitor [atorvastatin], voltaren [diclofenac sodium], and zocor [simvastatin].  MEDICATIONS:  Current Outpatient Medications  Medication Sig Dispense Refill   acetaminophen (TYLENOL) 325 MG tablet Take 352 mg by mouth every 6 (six) hours as needed for mild pain or moderate pain.     ALPRAZolam (XANAX) 1 MG tablet Take 0.5-1 tablets (0.5-1 mg total) by mouth at bedtime as needed for anxiety or sleep. 90 tablet 1   amLODipine (NORVASC) 5 MG tablet Take 1 tablet (5 mg total) by mouth daily. 90 tablet 3   captopril (CAPOTEN) 50 MG tablet TAKE 1 TABLET BY MOUTH TWICE A DAY 180 tablet 3   Cholecalciferol 1000 UNITS tablet Take 1,000 Units by mouth daily. Vitamin d     Coenzyme Q10 (CO Q-10) 100 MG CAPS Take by mouth daily.     ezetimibe (ZETIA)  10 MG tablet TAKE 1 TABLET BY MOUTH EVERY DAY 90 tablet 3   mirabegron ER (MYRBETRIQ) 50 MG TB24  tablet Take 1 tablet (50 mg total) by mouth daily. 90 tablet 3   omeprazole (PRILOSEC) 20 MG capsule Take 2 capsules (40 mg total) by mouth daily. Annual appt due in March must see provider for future refills (Patient taking differently: Take 20 mg by mouth daily. Prn at hs also) 180 capsule 3   Pancrelipase, Lip-Prot-Amyl, (CREON) 24000-76000 units CPEP TAKE ONE CAPSULE BY MOUTH 3 TIMES A DAY WITH FOOD (Patient taking differently: as needed. TAKE ONE CAPSULE BY MOUTH 3 TIMES A DAY WITH FOOD) 270 capsule 3   polyethylene glycol powder (GAVILAX) 17 GM/SCOOP powder MIX 1 CAPFUL (17 GRAMS) AS DIRECTED AND DRINK TWICE A DAY AS NEEDED FOR MODERATE CONSTIPATION 510 g 5   propranolol ER (INDERAL LA) 120 MG 24 hr capsule Take 1 capsule (120 mg total) by mouth daily. 90 capsule 3   RESTASIS 0.05 % ophthalmic emulsion Place 1 drop into both eyes 2 (two) times daily.      Vitamin A 2400 MCG (8000 UT) CAPS Take 2,400 mcg by mouth daily.     No current facility-administered medications for this visit.   Facility-Administered Medications Ordered in Other Visits  Medication Dose Route Frequency Provider Last Rate Last Admin   CARBOplatin (PARAPLATIN) 330 mg in sodium chloride 0.9 % 100 mL chemo infusion  330 mg Intravenous Once Heath Lark, MD 266 mL/hr at 01/20/22 1234 330 mg at 01/20/22 1234    SUMMARY OF ONCOLOGIC HISTORY: Oncology History Overview Note  Endometrioid FIGO grade 1-2, MSI high   Endometrial adenocarcinoma ([place])  07/19/2021 Imaging   US pelvis 1. Endometrial thickness of 5.6 mm. In the setting of post-menopausal bleeding, endometrial sampling is indicated to exclude carcinoma.  2. Densely calcified shadowing mass within the right uterus presumably representing a calcified fibroid.   07/31/2021 Pathology Results   SURGICAL PATHOLOGY   FINAL MICROSCOPIC DIAGNOSIS:   A. ENDOMETRIUM, BIOPSY:  - Endometrial adenocarcinoma, FIGO grade 1.  See comment  SUMMARY INTERPRETATION: ABNORMAL    There is loss of the major and minor MMR proteins MLH1 and PMS2. The loss of expression may be secondary to promoter hyper-methylation, gene mutation or other genetic event. BRAF mutation testing and/or MLH1 methylation testing is indicated. The presence of a BRAF mutation and/or MLH1 hypermethylation is indicative of a sporadic-type tumor. The absence of either BRAF mutation and/or presence of normal methylation indicate the possible presence of a hereditary germline mutation (e.g. Lynch syndrome) and referral to genetic counseling is warranted.    08/05/2021 Initial Diagnosis   Endometrial adenocarcinoma ([place])   08/21/2021 Pathology Results   FINAL MICROSCOPIC DIAGNOSIS:   A. LYMPH NODE, SENTINEL, RIGHT OBTURATOR, EXCISION:  - Lymph node, negative for carcinoma (0/1)   B. LYMPH NODE, SENTINEL, LEFT OBTURATOR, EXCISION:  - Lymph node, negative for carcinoma (0/1)   C. UTERUS, CERVIX, BILATERAL FALLOPIAN TUBES AND OVARIES:  - Endometrioid adenocarcinoma, FIGO grade 2  - Carcinoma invades through the entire myometrium and focally involves  the serosal surface  - Benign unremarkable cervix  - Benign bilateral fallopian tubes and ovaries  - See oncology table  - See comment   COMMENT:   C.  Tumor showed areas with clear cell morphology but immunostains for Napsin A and P504S do not show evidence of clear cell carcinoma.  Also, there is significant tumor necrosis, but the viable portion of tumor appears  to be FIGO grade 2.  Dr. Tresa Moore reviewed the select slides from the case and concurs with presence of serosal invasion.    ONCOLOGY TABLE:   UTERUS, CARCINOMA OR CARCINOSARCOMA: Resection   Procedure: Total hysterectomy and bilateral salpingo-oophorectomy  Histologic Type: Endometrioid adenocarcinoma  Histologic Grade: FIGO grade 1  Myometrial Invasion:       Depth of Myometrial Invasion (mm): 17 mm       Myometrial Thickness (mm): 17 mm       Percentage of Myometrial Invasion: 100%   Uterine Serosa Involvement: Present, focal  Cervical stromal Involvement: Not identified  Extent of involvement of other tissue/organs: Not identified  Peritoneal/Ascitic Fluid: Not applicable  Lymphovascular Invasion: Not identified  Regional Lymph Nodes:       Pelvic Lymph Nodes Examined:                                   2 Sentinel                                   0 Non-sentinel                                   2 Total       Pelvic Lymph Nodes with Metastasis: 0       Para-aortic Lymph Nodes Examined:                                    0 Sentinel                                    0 Non-sentinel                                    0 Total       Para-aortic Lymph Nodes with Metastasis: 0  Distant Metastasis:       Distant Site(s) Involved: Not applicable  Pathologic Stage Classification (pTNM, AJCC 8th Edition): pT3a, pN0  Ancillary Studies: MMR / MSI testing will be ordered  Representative Tumor Block: C10  Comment(s): Pancytokeratin was performed on the lymph nodes and is negative.    08/21/2021 Surgery   Surgeon: Donaciano Eva    Operation: Robotic-assisted laparoscopic total hysterectomy with bilateral salpingoophorectomy, SLN biopsy     Operative Findings:  : 6cm uterus with anterior fibroid (approx 4cm). Normal appearing tubes and ovaries. No suspicious nodes. Bilateral mapping.     10/03/2021 Cancer Staging   Staging form: Corpus Uteri - Carcinoma and Carcinosarcoma, AJCC 8th Edition - Clinical stage from 10/03/2021: FIGO Stage III (cT3, cN0, cM0) - Signed by Heath Lark, MD on 10/03/2021 Stage prefix: Initial diagnosis    10/14/2021 -  Chemotherapy   Patient is on Treatment Plan : Uterine Carboplatin (AUC 5) 21d       PHYSICAL EXAMINATION: ECOG PERFORMANCE STATUS: 2 - Symptomatic, <50% confined to bed  Vitals:   01/20/22 1018  BP: 132/63  Pulse: 62  Resp: 18  Temp: 97.6 F (36.4 C)  SpO2: 100%   Filed Weights  01/20/22 1018  Weight: 138 lb  (62.6 kg)    GENERAL:alert, no distress and comfortable NEURO: alert & oriented x 3 with fluent speech, no focal motor/sensory deficits  LABORATORY DATA:  I have reviewed the data as listed    Component Value Date/Time   NA 137 01/20/2022 1008   K 3.9 01/20/2022 1008   CL 103 01/20/2022 1008   CO2 27 01/20/2022 1008   GLUCOSE 114 (H) 01/20/2022 1008   GLUCOSE 96 11/16/2006 1058   BUN 24 (H) 01/20/2022 1008   CREATININE 0.72 01/20/2022 1008   CALCIUM 8.9 01/20/2022 1008   PROT 6.6 12/10/2021 1406   ALBUMIN 3.9 12/10/2021 1406   AST 25 12/10/2021 1406   AST 23 10/03/2021 1236   ALT 37 (H) 12/10/2021 1406   ALT 30 10/03/2021 1236   ALKPHOS 118 (H) 12/10/2021 1406   BILITOT 0.3 12/10/2021 1406   BILITOT 0.6 10/03/2021 1236   GFRNONAA >60 01/20/2022 1008   GFRAA >60 09/13/2020 0800    No results found for: SPEP, UPEP  Lab Results  Component Value Date   WBC 2.8 (L) 01/20/2022   NEUTROABS 1.1 (L) 01/20/2022   HGB 9.7 (L) 01/20/2022   HCT 28.8 (L) 01/20/2022   MCV 101.4 (H) 01/20/2022   PLT 128 (L) 01/20/2022      Chemistry      Component Value Date/Time   NA 137 01/20/2022 1008   K 3.9 01/20/2022 1008   CL 103 01/20/2022 1008   CO2 27 01/20/2022 1008   BUN 24 (H) 01/20/2022 1008   CREATININE 0.72 01/20/2022 1008      Component Value Date/Time   CALCIUM 8.9 01/20/2022 1008   ALKPHOS 118 (H) 12/10/2021 1406   AST 25 12/10/2021 1406   AST 23 10/03/2021 1236   ALT 37 (H) 12/10/2021 1406   ALT 30 10/03/2021 1236   BILITOT 0.3 12/10/2021 1406   BILITOT 0.6 10/03/2021 1236

## 2022-01-20 NOTE — Progress Notes (Signed)
Ok to proceed with ANC of 1.1 per Dr. Alvy Bimler.

## 2022-01-23 ENCOUNTER — Encounter: Payer: Self-pay | Admitting: Radiation Oncology

## 2022-01-24 ENCOUNTER — Encounter: Payer: Self-pay | Admitting: Radiology

## 2022-01-24 ENCOUNTER — Telehealth: Payer: Self-pay | Admitting: *Deleted

## 2022-01-24 NOTE — Telephone Encounter (Signed)
CALLED PATIENT'S DAUGHTER -MIA TO ASK ABOUT RESCHEDULING FU ON 01-30-22 DUE TO DR. KINARD BEING IN THE OR, RESCHEDULED FOR 02-20-22 @ 10:15 AM, LVM FOR A RETURN CALL

## 2022-01-28 ENCOUNTER — Telehealth: Payer: Self-pay | Admitting: *Deleted

## 2022-01-28 NOTE — Telephone Encounter (Signed)
RETURNED PATIENT'S RELATIVE'S PHONE CALL ([place]), SPOKE WITH PATIENT'S RELATIVE- [place]

## 2022-01-30 ENCOUNTER — Ambulatory Visit: Payer: Medicare Other | Admitting: Radiation Oncology

## 2022-02-04 ENCOUNTER — Other Ambulatory Visit: Payer: Self-pay | Admitting: Internal Medicine

## 2022-02-14 ENCOUNTER — Telehealth: Payer: Self-pay | Admitting: *Deleted

## 2022-02-14 NOTE — Telephone Encounter (Signed)
CALLED PATIENT TO ASK ABOUT RESCHEDULING FU FOR 02-20-22 DUE TO DR. KINARD BEING OFF, LVM FOR A RETURN CALL ?

## 2022-02-17 ENCOUNTER — Telehealth: Payer: Self-pay | Admitting: *Deleted

## 2022-02-17 MED FILL — Fosaprepitant Dimeglumine For IV Infusion 150 MG (Base Eq): INTRAVENOUS | Qty: 5 | Status: AC

## 2022-02-17 MED FILL — Dexamethasone Sodium Phosphate Inj 100 MG/10ML: INTRAMUSCULAR | Qty: 1 | Status: AC

## 2022-02-17 NOTE — Telephone Encounter (Signed)
RETURNED PATIENT'S RELATIVE ([place]) PHONE CALL, SPOKE WITH [name] ?

## 2022-02-18 ENCOUNTER — Inpatient Hospital Stay (HOSPITAL_BASED_OUTPATIENT_CLINIC_OR_DEPARTMENT_OTHER): Payer: Medicare Other | Admitting: Hematology and Oncology

## 2022-02-18 ENCOUNTER — Inpatient Hospital Stay: Payer: Medicare Other

## 2022-02-18 ENCOUNTER — Other Ambulatory Visit: Payer: Self-pay

## 2022-02-18 ENCOUNTER — Inpatient Hospital Stay: Payer: Medicare Other | Attending: Gynecologic Oncology

## 2022-02-18 VITALS — BP 132/65 | HR 63 | Temp 97.5°F | Resp 18 | Ht 60.0 in | Wt 138.2 lb

## 2022-02-18 DIAGNOSIS — K089 Disorder of teeth and supporting structures, unspecified: Secondary | ICD-10-CM

## 2022-02-18 DIAGNOSIS — C541 Malignant neoplasm of endometrium: Secondary | ICD-10-CM | POA: Diagnosis present

## 2022-02-18 DIAGNOSIS — R531 Weakness: Secondary | ICD-10-CM | POA: Insufficient documentation

## 2022-02-18 DIAGNOSIS — Z7189 Other specified counseling: Secondary | ICD-10-CM | POA: Diagnosis not present

## 2022-02-18 DIAGNOSIS — D61818 Other pancytopenia: Secondary | ICD-10-CM | POA: Insufficient documentation

## 2022-02-18 LAB — CBC WITH DIFFERENTIAL (CANCER CENTER ONLY)
Abs Immature Granulocytes: 0.01 10*3/uL (ref 0.00–0.07)
Basophils Absolute: 0 10*3/uL (ref 0.0–0.1)
Basophils Relative: 0 %
Eosinophils Absolute: 0.1 10*3/uL (ref 0.0–0.5)
Eosinophils Relative: 2 %
HCT: 29.2 % — ABNORMAL LOW (ref 36.0–46.0)
Hemoglobin: 9.8 g/dL — ABNORMAL LOW (ref 12.0–15.0)
Immature Granulocytes: 0 %
Lymphocytes Relative: 49 %
Lymphs Abs: 1.6 10*3/uL (ref 0.7–4.0)
MCH: 35.8 pg — ABNORMAL HIGH (ref 26.0–34.0)
MCHC: 33.6 g/dL (ref 30.0–36.0)
MCV: 106.6 fL — ABNORMAL HIGH (ref 80.0–100.0)
Monocytes Absolute: 0.3 10*3/uL (ref 0.1–1.0)
Monocytes Relative: 10 %
Neutro Abs: 1.3 10*3/uL — ABNORMAL LOW (ref 1.7–7.7)
Neutrophils Relative %: 39 %
Platelet Count: 71 10*3/uL — ABNORMAL LOW (ref 150–400)
RBC: 2.74 MIL/uL — ABNORMAL LOW (ref 3.87–5.11)
RDW: 16.2 % — ABNORMAL HIGH (ref 11.5–15.5)
WBC Count: 3.3 10*3/uL — ABNORMAL LOW (ref 4.0–10.5)
nRBC: 0 % (ref 0.0–0.2)

## 2022-02-18 LAB — BASIC METABOLIC PANEL - CANCER CENTER ONLY
Anion gap: 6 (ref 5–15)
BUN: 21 mg/dL (ref 8–23)
CO2: 27 mmol/L (ref 22–32)
Calcium: 9.2 mg/dL (ref 8.9–10.3)
Chloride: 102 mmol/L (ref 98–111)
Creatinine: 0.66 mg/dL (ref 0.44–1.00)
GFR, Estimated: >60 mL/min (ref >60)
Glucose, Bld: 102 mg/dL — ABNORMAL HIGH (ref 70–99)
Potassium: 4.1 mmol/L (ref 3.5–5.1)
Sodium: 135 mmol/L (ref 135–145)

## 2022-02-19 ENCOUNTER — Encounter: Payer: Self-pay | Admitting: Hematology and Oncology

## 2022-02-19 DIAGNOSIS — K089 Disorder of teeth and supporting structures, unspecified: Secondary | ICD-10-CM | POA: Insufficient documentation

## 2022-02-19 NOTE — Assessment & Plan Note (Signed)
She has severe pancytopenia due to treatment ?Previously, I have ordered B12 and iron levels and they were adequate ?She is symptomatic with progressive pancytopenia and recent infection ?I recommend discontinuation of treatment ?I am optimistic we will see improvement of her blood count next month with discontinuation of treatment ?

## 2022-02-19 NOTE — Assessment & Plan Note (Signed)
We had numerous discussions about goals of care in the past ?I do not recommend the patient to pursue final treatment today due to her significant decline and she is in agreement ?

## 2022-02-19 NOTE — Progress Notes (Signed)
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[facility]) Unfortunately, despite significant dose adjustment and spacing out her treatment, she has worsening pancytopenia She had recurrent oral infection and was prescribed a course of antibiotics recently due to poor dentition Given her age, comorbidities and progressive decline in her performance status and blood counts, I recommend discontinuation of chemotherapy The patient is concerned about her final treatment, meaning cycle 6 of treatment I do not recommend we pursue that I recommend we stop treatment today, and focus on supportive care and other health issues I plan to order imaging study in a month and see her back for further follow-up  Pancytopenia, acquired ([place]) She has severe pancytopenia due to treatment Previously, I have ordered B12 and iron levels and they were adequate She is symptomatic with progressive pancytopenia and recent infection I recommend discontinuation of treatment I am optimistic we will see improvement of her blood count next month with discontinuation of treatment  Generalized weakness She has generalized weakness and profound fatigue with each cycle of treatment, due to blood count issues, side effects of treatment and her age As above, I recommend discontinuation of treatment  Poor dentition She has very poor dentition and had recent infection requiring amoxicillin therapy I am hopeful, with discontinuation of treatment and improvement of her immune system, the risk of recurrent infection will be less I recommend the patient to wait at least a month until we recheck  her blood count next month before she present herself back to the dentist for possible dental extraction and other oral care  Goals of care, counseling/discussion We had numerous discussions about goals of care in the past I do not recommend the patient to pursue final treatment today due to her significant decline and she is in agreement  Orders Placed This Encounter  Procedures   CT ABDOMEN PELVIS W CONTRAST    Standing Status:   Future    Standing Expiration Date:   02/19/2023    Order Specific Question:   If indicated for the ordered procedure, I authorize the administration of contrast media per Radiology protocol    Answer:   Yes    Order Specific Question:   Preferred imaging location?    Answer:   [facility]    Order Specific Question:   Radiology Contrast Protocol - do NOT remove file path    Answer:   [email or web address]   CMP ([facility] only)    Standing Status:   Future    Standing Expiration Date:   02/20/2023   CBC with Differential (Cancer Center Only)    Standing Status:   Future    Standing Expiration Date:   02/20/2023    All questions were answered. The patient knows to call the clinic with any problems, questions or concerns. The total time spent in the appointment was 40 minutes encounter with patients including review of chart and various tests results, discussions about plan of care and coordination of care plan   Heath Lark, MD 02/19/2022 12:30 PM  INTERVAL HISTORY: Please see below for problem oriented charting. she returns for treatment follow-up with an interpreter She was prescribed a course of amoxicillin 2 weeks ago due to recurrent oral  infection She felt weak and have generalized fatigue and decline since her last treatment She denies bleeding but has some bruises Denies recent nausea or changes in her bowel habits  REVIEW OF SYSTEMS:   Constitutional: Denies fevers, chills or abnormal weight  loss Eyes: Denies blurriness of vision Ears, nose, mouth, throat, and face: Denies mucositis or sore throat Respiratory: Denies cough, dyspnea or wheezes Cardiovascular: Denies palpitation, chest discomfort or lower extremity swelling Gastrointestinal:  Denies nausea, heartburn or change in bowel habits Skin: Denies abnormal skin rashes Lymphatics: Denies new lymphadenopathy or easy bruising Neurological:Denies numbness, tingling or new weaknesses Behavioral/Psych: Mood is stable, no new changes  All other systems were reviewed with the patient and are negative.  I have reviewed the past medical history, past surgical history, social history and family history with the patient and they are unchanged from previous note.  ALLERGIES:  is allergic to fosamax [alendronate sodium], lipitor [atorvastatin], voltaren [diclofenac sodium], and zocor [simvastatin].  MEDICATIONS:  Current Outpatient Medications  Medication Sig Dispense Refill   acetaminophen (TYLENOL) 325 MG tablet Take 352 mg by mouth every 6 (six) hours as needed for mild pain or moderate pain.     ALPRAZolam (XANAX) 1 MG tablet TAKE 0.5-1 TABLETS (0.5-1 MG TOTAL) BY MOUTH AT BEDTIME AS NEEDED FOR ANXIETY OR SLEEP. 90 tablet 1   amLODipine (NORVASC) 5 MG tablet Take 1 tablet (5 mg total) by mouth daily. 90 tablet 3   captopril (CAPOTEN) 50 MG tablet TAKE 1 TABLET BY MOUTH TWICE A DAY 180 tablet 3   Cholecalciferol 1000 UNITS tablet Take 1,000 Units by mouth daily. Vitamin d     Coenzyme Q10 (CO Q-10) 100 MG CAPS Take by mouth daily.     ezetimibe (ZETIA) 10 MG tablet TAKE 1 TABLET BY MOUTH EVERY DAY 90 tablet 3   mirabegron ER (MYRBETRIQ) 50 MG TB24 tablet Take 1 tablet (50 mg total) by mouth daily. 90 tablet 3   omeprazole (PRILOSEC) 20 MG capsule Take 2 capsules (40 mg total) by mouth daily. Annual appt due in March must see provider for future refills (Patient taking differently: Take 20 mg by mouth daily. Prn at hs also) 180  capsule 3   Pancrelipase, Lip-Prot-Amyl, (CREON) 24000-76000 units CPEP TAKE ONE CAPSULE BY MOUTH 3 TIMES A DAY WITH FOOD (Patient taking differently: as needed. TAKE ONE CAPSULE BY MOUTH 3 TIMES A DAY WITH FOOD) 270 capsule 3   polyethylene glycol powder (GAVILAX) 17 GM/SCOOP powder MIX 1 CAPFUL (17 GRAMS) AS DIRECTED AND DRINK TWICE A DAY AS NEEDED FOR MODERATE CONSTIPATION 510 g 5   propranolol ER (INDERAL LA) 120 MG 24 hr capsule Take 1 capsule (120 mg total) by mouth daily. 90 capsule 3   RESTASIS 0.05 % ophthalmic emulsion Place 1 drop into both eyes 2 (two) times daily.      Vitamin A 2400 MCG (8000 UT) CAPS Take 2,400 mcg by mouth daily.     No current facility-administered medications for this visit.    SUMMARY OF ONCOLOGIC HISTORY: Oncology History Overview Note  Endometrioid FIGO grade 1-2, MSI high   Endometrial adenocarcinoma ([place])  07/19/2021 Imaging   US pelvis 1. Endometrial thickness of 5.6 mm. In the setting of post-menopausal bleeding, endometrial sampling is indicated to exclude carcinoma.  2. Densely calcified shadowing mass within the right uterus presumably representing a calcified fibroid.   07/31/2021 Pathology Results   SURGICAL PATHOLOGY   FINAL MICROSCOPIC DIAGNOSIS:   A. ENDOMETRIUM, BIOPSY:  -  Endometrial adenocarcinoma, FIGO grade 1.  See comment  SUMMARY INTERPRETATION: ABNORMAL   There is loss of the major and minor MMR proteins MLH1 and PMS2. The loss of expression may be secondary to promoter hyper-methylation, gene mutation or other genetic event. BRAF mutation testing and/or MLH1 methylation testing is indicated. The presence of a BRAF mutation and/or MLH1 hypermethylation is indicative of a sporadic-type tumor. The absence of either BRAF mutation and/or presence of normal methylation indicate the possible presence of a hereditary germline mutation (e.g. Lynch syndrome) and referral to genetic counseling is warranted.    08/05/2021 Initial Diagnosis    Endometrial adenocarcinoma ([place])   08/21/2021 Pathology Results   FINAL MICROSCOPIC DIAGNOSIS:   A. LYMPH NODE, SENTINEL, RIGHT OBTURATOR, EXCISION:  - Lymph node, negative for carcinoma (0/1)   B. LYMPH NODE, SENTINEL, LEFT OBTURATOR, EXCISION:  - Lymph node, negative for carcinoma (0/1)   C. UTERUS, CERVIX, BILATERAL FALLOPIAN TUBES AND OVARIES:  - Endometrioid adenocarcinoma, FIGO grade 2  - Carcinoma invades through the entire myometrium and focally involves  the serosal surface  - Benign unremarkable cervix  - Benign bilateral fallopian tubes and ovaries  - See oncology table  - See comment   COMMENT:   C.  Tumor showed areas with clear cell morphology but immunostains for Napsin A and P504S do not show evidence of clear cell carcinoma.  Also, there is significant tumor necrosis, but the viable portion of tumor appears to be FIGO grade 2.  Dr. Tresa Moore reviewed the select slides from the case and concurs with presence of serosal invasion.    ONCOLOGY TABLE:   UTERUS, CARCINOMA OR CARCINOSARCOMA: Resection   Procedure: Total hysterectomy and bilateral salpingo-oophorectomy  Histologic Type: Endometrioid adenocarcinoma  Histologic Grade: FIGO grade 1  Myometrial Invasion:       Depth of Myometrial Invasion (mm): 17 mm       Myometrial Thickness (mm): 17 mm       Percentage of Myometrial Invasion: 100%  Uterine Serosa Involvement: Present, focal  Cervical stromal Involvement: Not identified  Extent of involvement of other tissue/organs: Not identified  Peritoneal/Ascitic Fluid: Not applicable  Lymphovascular Invasion: Not identified  Regional Lymph Nodes:       Pelvic Lymph Nodes Examined:                                   2 Sentinel                                   0 Non-sentinel                                   2 Total       Pelvic Lymph Nodes with Metastasis: 0       Para-aortic Lymph Nodes Examined:                                    0 Sentinel                                     0 Non-sentinel  0 Total       Para-aortic Lymph Nodes with Metastasis: 0  Distant Metastasis:       Distant Site(s) Involved: Not applicable  Pathologic Stage Classification (pTNM, AJCC 8th Edition): pT3a, pN0  Ancillary Studies: MMR / MSI testing will be ordered  Representative Tumor Block: C10  Comment(s): Pancytokeratin was performed on the lymph nodes and is negative.    08/21/2021 Surgery   Surgeon: Donaciano Eva    Operation: Robotic-assisted laparoscopic total hysterectomy with bilateral salpingoophorectomy, SLN biopsy     Operative Findings:  : 6cm uterus with anterior fibroid (approx 4cm). Normal appearing tubes and ovaries. No suspicious nodes. Bilateral mapping.     10/03/2021 Cancer Staging   Staging form: Corpus Uteri - Carcinoma and Carcinosarcoma, AJCC 8th Edition - Clinical stage from 10/03/2021: FIGO Stage III (cT3, cN0, cM0) - Signed by Heath Lark, MD on 10/03/2021 Stage prefix: Initial diagnosis    10/14/2021 - 01/20/2022 Chemotherapy   Patient is on Treatment Plan : Uterine Carboplatin (AUC 5) 21d        PHYSICAL EXAMINATION: ECOG PERFORMANCE STATUS: 2 - Symptomatic, <50% confined to bed  Vitals:   02/18/22 1150  BP: 132/65  Pulse: 63  Resp: 18  Temp: (!) 97.5 F (36.4 C)  SpO2: 99%   Filed Weights   02/18/22 1150  Weight: 138 lb 3.2 oz (62.7 kg)    GENERAL:alert, no distress and comfortable.  She appears weak and frail.  NEURO: alert & oriented x 3 with fluent speech, no focal motor/sensory deficits  LABORATORY DATA:  I have reviewed the data as listed    Component Value Date/Time   NA 135 02/18/2022 1129   K 4.1 02/18/2022 1129   CL 102 02/18/2022 1129   CO2 27 02/18/2022 1129   GLUCOSE 102 (H) 02/18/2022 1129   GLUCOSE 96 11/16/2006 1058   BUN 21 02/18/2022 1129   CREATININE 0.66 02/18/2022 1129   CALCIUM 9.2 02/18/2022 1129   PROT 6.6 12/10/2021 1406   ALBUMIN 3.9 12/10/2021  1406   AST 25 12/10/2021 1406   AST 23 10/03/2021 1236   ALT 37 (H) 12/10/2021 1406   ALT 30 10/03/2021 1236   ALKPHOS 118 (H) 12/10/2021 1406   BILITOT 0.3 12/10/2021 1406   BILITOT 0.6 10/03/2021 1236   GFRNONAA >60 02/18/2022 1129   GFRAA >60 09/13/2020 0800    No results found for: SPEP, UPEP  Lab Results  Component Value Date   WBC 3.3 (L) 02/18/2022   NEUTROABS 1.3 (L) 02/18/2022   HGB 9.8 (L) 02/18/2022   HCT 29.2 (L) 02/18/2022   MCV 106.6 (H) 02/18/2022   PLT 71 (L) 02/18/2022      Chemistry      Component Value Date/Time   NA 135 02/18/2022 1129   K 4.1 02/18/2022 1129   CL 102 02/18/2022 1129   CO2 27 02/18/2022 1129   BUN 21 02/18/2022 1129   CREATININE 0.66 02/18/2022 1129      Component Value Date/Time   CALCIUM 9.2 02/18/2022 1129   ALKPHOS 118 (H) 12/10/2021 1406   AST 25 12/10/2021 1406   AST 23 10/03/2021 1236   ALT 37 (H) 12/10/2021 1406   ALT 30 10/03/2021 1236   BILITOT 0.3 12/10/2021 1406   BILITOT 0.6 10/03/2021 1236

## 2022-02-19 NOTE — Assessment & Plan Note (Signed)
She has generalized weakness and profound fatigue with each cycle of treatment, due to blood count issues, side effects of treatment and her age ?As above, I recommend discontinuation of treatment ?

## 2022-02-19 NOTE — Assessment & Plan Note (Signed)
She has very poor dentition and had recent infection requiring amoxicillin therapy ?I am hopeful, with discontinuation of treatment and improvement of her immune system, the risk of recurrent infection will be less ?I recommend the patient to wait at least a month until we recheck her blood count next month before she present herself back to the dentist for possible dental extraction and other oral care ?

## 2022-02-19 NOTE — Assessment & Plan Note (Signed)
Unfortunately, despite significant dose adjustment and spacing out her treatment, she has worsening pancytopenia ?She had recurrent oral infection and was prescribed a course of antibiotics recently due to poor dentition ?Given her age, comorbidities and progressive decline in her performance status and blood counts, I recommend discontinuation of chemotherapy ?The patient is concerned about her final treatment, meaning cycle 6 of treatment ?I do not recommend we pursue that ?I recommend we stop treatment today, and focus on supportive care and other health issues ?I plan to order imaging study in a month and see her back for further follow-up ?

## 2022-02-20 ENCOUNTER — Ambulatory Visit: Payer: Medicare Other | Admitting: Radiation Oncology

## 2022-02-21 ENCOUNTER — Telehealth: Payer: Self-pay

## 2022-02-21 NOTE — Telephone Encounter (Signed)
Received call from [facility] and due to the language barrier she ask that I call [place]. ? ?[facility] and reviewed upcoming appts. She verbalized understanding and will call [place] to reassure her. ?
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
## 2022-03-06 ENCOUNTER — Encounter: Payer: Self-pay | Admitting: Radiation Oncology

## 2022-03-06 ENCOUNTER — Ambulatory Visit
Admission: RE | Admit: 2022-03-06 | Discharge: 2022-03-06 | Disposition: A | Discharge status: Home / Self Care | Payer: Medicare Other | Source: Ambulatory Visit | Attending: Radiation Oncology | Admitting: Radiation Oncology

## 2022-03-06 ENCOUNTER — Other Ambulatory Visit: Payer: Self-pay

## 2022-03-06 VITALS — BP 147/84 | HR 69 | Temp 97.5°F | Resp 18 | Ht 60.0 in | Wt 138.5 lb

## 2022-03-06 DIAGNOSIS — Z79899 Other long term (current) drug therapy: Secondary | ICD-10-CM | POA: Diagnosis not present

## 2022-03-06 DIAGNOSIS — Z923 Personal history of irradiation: Secondary | ICD-10-CM | POA: Diagnosis not present

## 2022-03-06 DIAGNOSIS — C541 Malignant neoplasm of endometrium: Secondary | ICD-10-CM | POA: Insufficient documentation

## 2022-03-06 DIAGNOSIS — Z08 Encounter for follow-up examination after completed treatment for malignant neoplasm: Secondary | ICD-10-CM | POA: Diagnosis not present

## 2022-03-06 NOTE — Progress Notes (Signed)
Sophia Santos is here today for follow up post radiation to the pelvic. ? ?They completed their radiation on: 12/18/2021  ? ?Does the patient complain of any of the following: ? ?Pain:occasional lower abdominal pain ?Abdominal bloating: denies ?Diarrhea/Constipation: constipation ?Nausea/Vomiting: denies ?Vaginal Discharge: denies ?Blood in Urine or Stool: denies ?Urinary Issues (dysuria/incomplete emptying/ incontinence/ increased frequency/urgency): dysuria, nocturia x 3-5, occasional urgency, occasional incontinence ?Does patient report using vaginal dilator 2-3 times a week and/or sexually active 2-3 weeks: education and dilators provided at this visit ?Post radiation skin changes: denies ? ? ?Additional comments if applicable: nothing of note ? ?Vitals:  ? 03/06/22 1504  ?BP: (!) 147/84  ?Pulse: 69  ?Resp: 18  ?Temp: (!) 97.5 ?F (36.4 ?C)  ?TempSrc: Temporal  ?SpO2: 99%  ?Weight: 138 lb 8 oz (62.8 kg)  ?Height: 5' (1.524 m)  ? ? ?

## 2022-03-11 ENCOUNTER — Encounter: Payer: Self-pay | Admitting: Internal Medicine

## 2022-03-11 ENCOUNTER — Ambulatory Visit (INDEPENDENT_AMBULATORY_CARE_PROVIDER_SITE_OTHER): Payer: Medicare Other | Admitting: Internal Medicine

## 2022-03-11 DIAGNOSIS — F4321 Adjustment disorder with depressed mood: Secondary | ICD-10-CM

## 2022-03-11 DIAGNOSIS — K8689 Other specified diseases of pancreas: Secondary | ICD-10-CM

## 2022-03-11 DIAGNOSIS — R7989 Other specified abnormal findings of blood chemistry: Secondary | ICD-10-CM

## 2022-03-11 DIAGNOSIS — F411 Generalized anxiety disorder: Secondary | ICD-10-CM

## 2022-03-11 DIAGNOSIS — M25552 Pain in left hip: Secondary | ICD-10-CM | POA: Diagnosis not present

## 2022-03-11 DIAGNOSIS — C541 Malignant neoplasm of endometrium: Secondary | ICD-10-CM

## 2022-03-11 DIAGNOSIS — G43809 Other migraine, not intractable, without status migrainosus: Secondary | ICD-10-CM

## 2022-03-11 NOTE — Assessment & Plan Note (Signed)
Mild and chronic Cont on Creon ac 

## 2022-03-11 NOTE — Assessment & Plan Note (Signed)
Chronic  ?Alprazolam prn ? Potential benefits of a long term benzodiazepines  use as well as potential risks  and complications were explained to the patient and were aknowledged. ?Son died in the fall of 03-21-21 ?Grieving - discussed ?On chemo for cancer ?

## 2022-03-11 NOTE — Assessment & Plan Note (Signed)
Recurrent migraines ?Fioricet rare use ? Potential benefits of a long term fioricet  use as well as potential risks  and complications were explained to the patient and were aknowledged. ?

## 2022-03-11 NOTE — Assessment & Plan Note (Addendum)
F/u w/Dr Alvy Bimler ?CT abd is pending ?CBC, CMET pending ? ? PCS form filled out. Needs 120 h/wk ?

## 2022-03-11 NOTE — Assessment & Plan Note (Signed)
PCS form filled out. ?

## 2022-03-11 NOTE — Progress Notes (Signed)
? ?Subjective:  ?[name], female    DOB: 23-Feb-1934  Age: 88 y.o. MRN: [id number] ? ?CC: No chief complaint on file. ? ? ?HPI ?Sophia Santos presents for anxiety, grief, HTN ?On chemo ?Son died 6 mo ago ?Grieving. Remote falls x 2 - no relapse ?We need to fill out a new PCS form ? ?Outpatient Medications Prior to Visit  ?Medication Sig Dispense Refill  ? acetaminophen (TYLENOL) 325 MG tablet Take 352 mg by mouth every 6 (six) hours as needed for mild pain or moderate pain.    ? ALPRAZolam (XANAX) 1 MG tablet TAKE 0.5-1 TABLETS (0.5-1 MG TOTAL) BY MOUTH AT BEDTIME AS NEEDED FOR ANXIETY OR SLEEP. 90 tablet 1  ? amLODipine (NORVASC) 5 MG tablet Take 1 tablet (5 mg total) by mouth daily. 90 tablet 3  ? captopril (CAPOTEN) 50 MG tablet TAKE 1 TABLET BY MOUTH TWICE A DAY 180 tablet 3  ? Cholecalciferol 1000 UNITS tablet Take 1,000 Units by mouth daily. Vitamin d    ? Coenzyme Q10 (CO Q-10) 100 MG CAPS Take by mouth daily.    ? ezetimibe (ZETIA) 10 MG tablet TAKE 1 TABLET BY MOUTH EVERY DAY 90 tablet 3  ? mirabegron ER (MYRBETRIQ) 50 MG TB24 tablet Take 1 tablet (50 mg total) by mouth daily. 90 tablet 3  ? omeprazole (PRILOSEC) 20 MG capsule Take 2 capsules (40 mg total) by mouth daily. Annual appt due in March must see provider for future refills (Patient taking differently: Take 20 mg by mouth daily. Prn at hs also) 180 capsule 3  ? Pancrelipase, Lip-Prot-Amyl, (CREON) 24000-76000 units CPEP TAKE ONE CAPSULE BY MOUTH 3 TIMES A DAY WITH FOOD (Patient taking differently: as needed. TAKE ONE CAPSULE BY MOUTH 3 TIMES A DAY WITH FOOD) 270 capsule 3  ? polyethylene glycol powder (GAVILAX) 17 GM/SCOOP powder MIX 1 CAPFUL (17 GRAMS) AS DIRECTED AND DRINK TWICE A DAY AS NEEDED FOR MODERATE CONSTIPATION 510 g 5  ? propranolol ER (INDERAL LA) 120 MG 24 hr capsule Take 1 capsule (120 mg total) by mouth daily. 90 capsule 3  ? RESTASIS 0.05 % ophthalmic emulsion Place 1 drop into both eyes 2 (two) times daily.     ?  Vitamin A 2400 MCG (8000 UT) CAPS Take 2,400 mcg by mouth daily.    ? ?No facility-administered medications prior to visit.  ? ? ?ROS: ?Review of Systems  ?Constitutional:  Positive for fatigue. Negative for activity change, appetite change, chills and unexpected weight change.  ?HENT:  Negative for congestion, mouth sores and sinus pressure.   ?Eyes:  Negative for visual disturbance.  ?Respiratory:  Negative for cough and chest tightness.   ?Gastrointestinal:  Negative for abdominal pain and nausea.  ?Genitourinary:  Negative for difficulty urinating, frequency and vaginal pain.  ?Musculoskeletal:  Negative for arthralgias, back pain and gait problem.  ?Skin:  Negative for pallor and rash.  ?Neurological:  Positive for dizziness and weakness. Negative for tremors, syncope, numbness and headaches.  ?Psychiatric/Behavioral:  Positive for dysphoric mood. Negative for confusion, decreased concentration, sleep disturbance and suicidal ideas. The patient is nervous/anxious.   ? ?Objective:  ?BP 130/90 (BP Location: Left Arm, Patient Position: Sitting, Cuff Size: Large)   Pulse 73   Temp 97.8 ?F (36.6 ?C) (Oral)   Ht 5' (1.524 m)   Wt 135 lb (61.2 kg)   SpO2 99%   BMI 26.37 kg/m?  ? ?BP Readings from Last 3 Encounters:  ?03/11/22 130/90  ?03/06/22 Marland Kitchen)  147/84  ?02/18/22 132/65  ? ? ?Wt Readings from Last 3 Encounters:  ?03/11/22 135 lb (61.2 kg)  ?03/06/22 138 lb 8 oz (62.8 kg)  ?02/18/22 138 lb 3.2 oz (62.7 kg)  ? ? ?Physical Exam ?Constitutional:   ?   General: She is not in acute distress. ?   Appearance: She is well-developed.  ?HENT:  ?   Head: Normocephalic.  ?   Right Ear: External ear normal.  ?   Left Ear: External ear normal.  ?   Nose: Nose normal.  ?Eyes:  ?   General:     ?   Right eye: No discharge.     ?   Left eye: No discharge.  ?   Conjunctiva/sclera: Conjunctivae normal.  ?   Pupils: Pupils are equal, round, and reactive to light.  ?Neck:  ?   Thyroid: No thyromegaly.  ?   Vascular: No JVD.  ?    Trachea: No tracheal deviation.  ?Cardiovascular:  ?   Rate and Rhythm: Normal rate and regular rhythm.  ?   Heart sounds: Normal heart sounds.  ?Pulmonary:  ?   Effort: No respiratory distress.  ?   Breath sounds: No stridor. No wheezing.  ?Abdominal:  ?   General: Bowel sounds are normal. There is no distension.  ?   Palpations: Abdomen is soft. There is no mass.  ?   Tenderness: There is no abdominal tenderness. There is no guarding or rebound.  ?Musculoskeletal:     ?   General: No tenderness.  ?   Cervical back: Normal range of motion and neck supple. No rigidity.  ?Lymphadenopathy:  ?   Cervical: No cervical adenopathy.  ?Skin: ?   Findings: No erythema or rash.  ?Neurological:  ?   Mental Status: She is oriented to person, place, and time.  ?   Cranial Nerves: No cranial nerve deficit.  ?   Motor: No abnormal muscle tone.  ?   Coordination: Coordination normal.  ?   Deep Tendon Reflexes: Reflexes normal.  ?Psychiatric:     ?   Behavior: Behavior normal.     ?   Thought Content: Thought content normal.     ?   Judgment: Judgment normal.  ?Tearfull, depressed ?Using a cane ? ? ? A total time of 46 minutes was spent preparing to see the patient, reviewing tests, x-rays, operative reports and other medical records.  Also, obtaining history and performing comprehensive physical exam.  Additionally, counseling the patient regarding the above listed issues - grief, depression, cancer, falls prevention.   Finally, documenting clinical information in the health records, coordination of care, educating the patient. PCS form filled out. ? ?Lab Results  ?Component Value Date  ? WBC 3.3 (L) 02/18/2022  ? HGB 9.8 (L) 02/18/2022  ? HCT 29.2 (L) 02/18/2022  ? PLT 71 (L) 02/18/2022  ? GLUCOSE 102 (H) 02/18/2022  ? CHOL 238 (H) 05/06/2021  ? TRIG 106.0 05/06/2021  ? HDL 65.10 05/06/2021  ? LDLDIRECT 143.0 03/02/2018  ? LDLCALC 152 (H) 05/06/2021  ? ALT 37 (H) 12/10/2021  ? AST 25 12/10/2021  ? NA 135 02/18/2022  ? K 4.1  02/18/2022  ? CL 102 02/18/2022  ? CREATININE 0.66 02/18/2022  ? BUN 21 02/18/2022  ? CO2 27 02/18/2022  ? TSH 1.39 05/06/2021  ? INR 1.0 02/23/2020  ? HGBA1C 5.9 09/29/2007  ? ? ?No results found. ? ?Assessment & Plan:  ? ?Problem List Items Addressed This Visit   ? ?  Anxiety disorder  ?  Chronic  ?Alprazolam prn ? Potential benefits of a long term benzodiazepines  use as well as potential risks  and complications were explained to the patient and were aknowledged. ?Son died in the fall of March 23, 2021 ?Grieving - discussed ?On chemo for cancer ?  ?  ? Migraines  ?  Recurrent migraines ?Fioricet rare use ? Potential benefits of a long term fioricet  use as well as potential risks  and complications were explained to the patient and were aknowledged. ?  ?  ? Abnormal CBC  ?  Due to chemo ?  ?  ? Pain in left hip  ?   PCS form filled out. ?  ?  ? Pancreatic insufficiency  ?  Mild and chronic ?Cont on Creon ac ?  ?  ? Endometrial adenocarcinoma ([place])  ?  F/u w/Dr Alvy Bimler ?CT abd is pending ?CBC, CMET pending ? ? PCS form filled out. Needs 120 h/wk ?  ?  ? Grief  ?  Son died in the fall of 2021-03-23 ?Grieving. Discussed ?  ?  ?  ? ? ?No orders of the defined types were placed in this encounter. ?  ? ? ?Follow-up: Return in about 3 months (around 06/11/2022) for a follow-up visit. ? ?Walker Kehr, MD ?

## 2022-03-11 NOTE — Assessment & Plan Note (Signed)
Son died in the fall of 2022 Grieving. Discussed 

## 2022-03-11 NOTE — Assessment & Plan Note (Signed)
Due to chemo ?

## 2022-03-21 ENCOUNTER — Ambulatory Visit (HOSPITAL_COMMUNITY)
Admission: RE | Admit: 2022-03-21 | Discharge: 2022-03-21 | Disposition: A | Discharge status: Home / Self Care | Payer: Medicare Other | Source: Ambulatory Visit | Attending: Hematology and Oncology | Admitting: Hematology and Oncology

## 2022-03-21 ENCOUNTER — Ambulatory Visit (HOSPITAL_COMMUNITY): Payer: Medicare Other

## 2022-03-21 ENCOUNTER — Other Ambulatory Visit: Payer: Self-pay

## 2022-03-21 ENCOUNTER — Inpatient Hospital Stay: Payer: Medicare Other | Attending: Gynecologic Oncology

## 2022-03-21 DIAGNOSIS — K449 Diaphragmatic hernia without obstruction or gangrene: Secondary | ICD-10-CM | POA: Diagnosis not present

## 2022-03-21 DIAGNOSIS — D61818 Other pancytopenia: Secondary | ICD-10-CM | POA: Insufficient documentation

## 2022-03-21 DIAGNOSIS — R531 Weakness: Secondary | ICD-10-CM | POA: Insufficient documentation

## 2022-03-21 DIAGNOSIS — C541 Malignant neoplasm of endometrium: Secondary | ICD-10-CM | POA: Insufficient documentation

## 2022-03-21 DIAGNOSIS — K5730 Diverticulosis of large intestine without perforation or abscess without bleeding: Secondary | ICD-10-CM | POA: Diagnosis not present

## 2022-03-21 DIAGNOSIS — D3500 Benign neoplasm of unspecified adrenal gland: Secondary | ICD-10-CM | POA: Diagnosis not present

## 2022-03-21 DIAGNOSIS — I10 Essential (primary) hypertension: Secondary | ICD-10-CM | POA: Insufficient documentation

## 2022-03-21 LAB — CBC WITH DIFFERENTIAL (CANCER CENTER ONLY)
Abs Immature Granulocytes: 0.02 10*3/uL (ref 0.00–0.07)
Basophils Absolute: 0.1 10*3/uL (ref 0.0–0.1)
Basophils Relative: 1 %
Eosinophils Absolute: 0 10*3/uL (ref 0.0–0.5)
Eosinophils Relative: 1 %
HCT: 34.4 % — ABNORMAL LOW (ref 36.0–46.0)
Hemoglobin: 11.6 g/dL — ABNORMAL LOW (ref 12.0–15.0)
Immature Granulocytes: 1 %
Lymphocytes Relative: 36 %
Lymphs Abs: 1.5 10*3/uL (ref 0.7–4.0)
MCH: 36.1 pg — ABNORMAL HIGH (ref 26.0–34.0)
MCHC: 33.7 g/dL (ref 30.0–36.0)
MCV: 107.2 fL — ABNORMAL HIGH (ref 80.0–100.0)
Monocytes Absolute: 0.5 10*3/uL (ref 0.1–1.0)
Monocytes Relative: 12 %
Neutro Abs: 2 10*3/uL (ref 1.7–7.7)
Neutrophils Relative %: 49 %
Platelet Count: 206 10*3/uL (ref 150–400)
RBC: 3.21 MIL/uL — ABNORMAL LOW (ref 3.87–5.11)
RDW: 12.9 % (ref 11.5–15.5)
WBC Count: 4 10*3/uL (ref 4.0–10.5)
nRBC: 0 % (ref 0.0–0.2)

## 2022-03-21 LAB — CMP (CANCER CENTER ONLY)
ALT: 26 U/L (ref 0–44)
AST: 24 U/L (ref 15–41)
Albumin: 4.1 g/dL (ref 3.5–5.0)
Alkaline Phosphatase: 141 U/L — ABNORMAL HIGH (ref 38–126)
Anion gap: 6 (ref 5–15)
BUN: 21 mg/dL (ref 8–23)
CO2: 30 mmol/L (ref 22–32)
Calcium: 9.4 mg/dL (ref 8.9–10.3)
Chloride: 100 mmol/L (ref 98–111)
Creatinine: 0.81 mg/dL (ref 0.44–1.00)
GFR, Estimated: >60 mL/min (ref >60)
Glucose, Bld: 94 mg/dL (ref 70–99)
Potassium: 4.1 mmol/L (ref 3.5–5.1)
Sodium: 136 mmol/L (ref 135–145)
Total Bilirubin: 0.4 mg/dL (ref 0.3–1.2)
Total Protein: 7.4 g/dL (ref 6.5–8.1)

## 2022-03-21 MED ORDER — IOHEXOL 300 MG/ML  SOLN
100.0000 mL | Freq: Once | INTRAMUSCULAR | Status: AC | PRN
  Administered 2022-03-21: 100 mL via INTRAVENOUS

## 2022-03-21 MED ORDER — SODIUM CHLORIDE (PF) 0.9 % IJ SOLN
INTRAMUSCULAR | Status: AC
  Filled 2022-03-21: qty 50

## 2022-03-25 ENCOUNTER — Inpatient Hospital Stay (HOSPITAL_BASED_OUTPATIENT_CLINIC_OR_DEPARTMENT_OTHER): Payer: Medicare Other | Admitting: Hematology and Oncology

## 2022-03-25 ENCOUNTER — Encounter: Payer: Self-pay | Admitting: Hematology and Oncology

## 2022-03-25 ENCOUNTER — Other Ambulatory Visit: Payer: Self-pay

## 2022-03-25 DIAGNOSIS — C541 Malignant neoplasm of endometrium: Secondary | ICD-10-CM

## 2022-03-25 DIAGNOSIS — R531 Weakness: Secondary | ICD-10-CM | POA: Diagnosis not present

## 2022-03-25 DIAGNOSIS — D61818 Other pancytopenia: Secondary | ICD-10-CM | POA: Diagnosis not present

## 2022-03-25 DIAGNOSIS — I10 Essential (primary) hypertension: Secondary | ICD-10-CM | POA: Diagnosis not present

## 2022-03-25 NOTE — Assessment & Plan Note (Signed)
I have reviewed blood work and imaging studies ?She has complete response to treatment with no evidence of disease ?Moving forward, we discussed future follow-up ?She will see Dr. Sondra Come in 3 months and see Dr. Berline Lopes in 6 months ?I anticipate she will recover fully over the next few months ?I recommend she contact her primary care doctor for an early appointment if needed if she has uncontrolled hypertension ?

## 2022-03-25 NOTE — Assessment & Plan Note (Signed)
She has persistent generalized weakness due to recovery from recent treatment ?I anticipate it will gradually improve in time ?At this point in time, she does not need physical therapy and rehab ?

## 2022-03-25 NOTE — Assessment & Plan Note (Signed)
This is almost completely resolved ?I anticipate she will make full recovery of her anemia in the next month or so ?

## 2022-03-25 NOTE — Progress Notes (Signed)
[facility] ?OFFICE PROGRESS NOTE ? ?Patient Care Team: ?Plotnikov, Evie Lacks, MD as PCP - General ?Azucena Fallen, MD (Obstetrics and Gynecology) ?Ralene Ok, MD as Consulting Physician (General Surgery) ?Milus Banister, MD as Attending Physician (Gastroenterology) ?Awanda Mink Craige Cotta, RN as Oncology Nurse Navigator (Oncology) ? ?ASSESSMENT & PLAN:  ?Endometrial adenocarcinoma ([place]) ?I have reviewed blood work and imaging studies ?She has complete response to treatment with no evidence of disease ?Moving forward, we discussed future follow-up ?She will see Dr. Sondra Come in 3 months and see Dr. Berline Lopes in 6 months ?I anticipate she will recover fully over the next few months ?I recommend she contact her primary care doctor for an early appointment if needed if she has uncontrolled hypertension ? ?Pancytopenia, acquired ([place]) ?This is almost completely resolved ?I anticipate she will make full recovery of her anemia in the next month or so ? ?Essential hypertension ?She has history of low blood pressure during chemotherapy and one of her blood pressure medication was held ?With discontinuation of chemotherapy and recent weight gain, her blood pressure has started to trend up ?I recommend the patient to resume her antihypertensives and maintain close follow-up with her primary care doctor for management ? ?Generalized weakness ?She has persistent generalized weakness due to recovery from recent treatment ?I anticipate it will gradually improve in time ?At this point in time, she does not need physical therapy and rehab ? ?No orders of the defined types were placed in this encounter. ? ? ?All questions were answered. The patient knows to call the clinic with any problems, questions or concerns. ?The total time spent in the appointment was 30 minutes encounter with patients including review of chart and various tests results, discussions about plan of care and coordination of care plan ?  ?Heath Lark,  MD ?03/25/2022 11:43 AM ? ?INTERVAL HISTORY: ?Please see below for problem oriented charting. ?she returns for surveillance follow-up with [place] interpreter ?She felt weak overall but denies recent fall ?She has gained some weight ?She has noticed her blood pressure is starting to trend upwards ?No recent infection, fever or chills ?Denies abdominal pain ? ?REVIEW OF SYSTEMS:   ?Constitutional: Denies fevers, chills or abnormal weight loss ?Eyes: Denies blurriness of vision ?Ears, nose, mouth, throat, and face: Denies mucositis or sore throat ?Respiratory: Denies cough, dyspnea or wheezes ?Cardiovascular: Denies palpitation, chest discomfort or lower extremity swelling ?Gastrointestinal:  Denies nausea, heartburn or change in bowel habits ?Skin: Denies abnormal skin rashes ?Lymphatics: Denies new lymphadenopathy or easy bruising ?Neurological:Denies numbness, tingling or new weaknesses ?Behavioral/Psych: Mood is stable, no new changes  ?All other systems were reviewed with the patient and are negative. ? ?I have reviewed the past medical history, past surgical history, social history and family history with the patient and they are unchanged from previous note. ? ?ALLERGIES:  is allergic to fosamax [alendronate sodium], lipitor [atorvastatin], voltaren [diclofenac sodium], and zocor [simvastatin]. ? ?MEDICATIONS:  ?Current Outpatient Medications  ?Medication Sig Dispense Refill  ? acetaminophen (TYLENOL) 325 MG tablet Take 352 mg by mouth every 6 (six) hours as needed for mild pain or moderate pain.    ? ALPRAZolam (XANAX) 1 MG tablet TAKE 0.5-1 TABLETS (0.5-1 MG TOTAL) BY MOUTH AT BEDTIME AS NEEDED FOR ANXIETY OR SLEEP. 90 tablet 1  ? amLODipine (NORVASC) 5 MG tablet Take 1 tablet (5 mg total) by mouth daily. 90 tablet 3  ? captopril (CAPOTEN) 50 MG tablet TAKE 1 TABLET BY MOUTH TWICE A DAY 180 tablet  3  ? Cholecalciferol 1000 UNITS tablet Take 1,000 Units by mouth daily. Vitamin d    ? Coenzyme Q10 (CO Q-10) 100  MG CAPS Take by mouth daily.    ? ezetimibe (ZETIA) 10 MG tablet TAKE 1 TABLET BY MOUTH EVERY DAY 90 tablet 3  ? mirabegron ER (MYRBETRIQ) 50 MG TB24 tablet Take 1 tablet (50 mg total) by mouth daily. 90 tablet 3  ? omeprazole (PRILOSEC) 20 MG capsule Take 2 capsules (40 mg total) by mouth daily. Annual appt due in March must see provider for future refills (Patient taking differently: Take 20 mg by mouth daily. Prn at hs also) 180 capsule 3  ? Pancrelipase, Lip-Prot-Amyl, (CREON) 24000-76000 units CPEP TAKE ONE CAPSULE BY MOUTH 3 TIMES A DAY WITH FOOD (Patient taking differently: as needed. TAKE ONE CAPSULE BY MOUTH 3 TIMES A DAY WITH FOOD) 270 capsule 3  ? polyethylene glycol powder (GAVILAX) 17 GM/SCOOP powder MIX 1 CAPFUL (17 GRAMS) AS DIRECTED AND DRINK TWICE A DAY AS NEEDED FOR MODERATE CONSTIPATION 510 g 5  ? propranolol ER (INDERAL LA) 120 MG 24 hr capsule Take 1 capsule (120 mg total) by mouth daily. 90 capsule 3  ? RESTASIS 0.05 % ophthalmic emulsion Place 1 drop into both eyes 2 (two) times daily.     ? Vitamin A 2400 MCG (8000 UT) CAPS Take 2,400 mcg by mouth daily.    ? ?No current facility-administered medications for this visit.  ? ? ?SUMMARY OF ONCOLOGIC HISTORY: ?Oncology History Overview Note  ?Endometrioid FIGO grade 1-2, MSI high ?  ?Endometrial adenocarcinoma ([place])  ?07/19/2021 Imaging  ? US pelvis ?1. Endometrial thickness of 5.6 mm. In the setting of post-menopausal bleeding, endometrial sampling is indicated to exclude carcinoma.  ?2. Densely calcified shadowing mass within the right uterus presumably representing a calcified fibroid. ?  ?07/31/2021 Pathology Results  ? SURGICAL PATHOLOGY  ? ?FINAL MICROSCOPIC DIAGNOSIS:  ? ?A. ENDOMETRIUM, BIOPSY:  ?- Endometrial adenocarcinoma, FIGO grade 1.  See comment ? ?SUMMARY INTERPRETATION: ABNORMAL  ? ?There is loss of the major and minor MMR proteins MLH1 and PMS2. The loss of expression may be secondary to promoter hyper-methylation, gene mutation or  other genetic event. BRAF mutation testing and/or MLH1 methylation testing is indicated. The presence of a BRAF mutation and/or MLH1 hypermethylation is indicative of a sporadic-type tumor. The absence of either BRAF mutation and/or presence of normal methylation indicate the possible presence of a hereditary germline mutation (e.g. Lynch syndrome) and referral to genetic counseling is warranted.  ?  ?08/05/2021 Initial Diagnosis  ? Endometrial adenocarcinoma [facility]) ?  ?08/21/2021 Pathology Results  ? FINAL MICROSCOPIC DIAGNOSIS:  ? ?A. LYMPH NODE, SENTINEL, RIGHT OBTURATOR, EXCISION:  ?- Lymph node, negative for carcinoma (0/1)  ? ?B. LYMPH NODE, SENTINEL, LEFT OBTURATOR, EXCISION:  ?- Lymph node, negative for carcinoma (0/1)  ? ?C. UTERUS, CERVIX, BILATERAL FALLOPIAN TUBES AND OVARIES:  ?- Endometrioid adenocarcinoma, FIGO grade 2  ?- Carcinoma invades through the entire myometrium and focally involves  ?the serosal surface  ?- Benign unremarkable cervix  ?- Benign bilateral fallopian tubes and ovaries  ?- See oncology table  ?- See comment  ? ?COMMENT:  ? ?C.  Tumor showed areas with clear cell morphology but immunostains for Napsin A and P504S do not show evidence of clear cell carcinoma.  Also, there is significant tumor necrosis, but the viable portion of tumor appears to be FIGO grade 2.  Dr. Tresa Moore reviewed the select slides from the case  and concurs with presence of serosal invasion.  ? ? ?ONCOLOGY TABLE:  ? ?UTERUS, CARCINOMA OR CARCINOSARCOMA: Resection  ? ?Procedure: Total hysterectomy and bilateral salpingo-oophorectomy  ?Histologic Type: Endometrioid adenocarcinoma  ?Histologic Grade: FIGO grade 1  ?Myometrial Invasion:  ?     Depth of Myometrial Invasion (mm): 17 mm  ?     Myometrial Thickness (mm): 17 mm  ?     Percentage of Myometrial Invasion: 100%  ?Uterine Serosa Involvement: Present, focal  ?Cervical stromal Involvement: Not identified  ?Extent of involvement of other tissue/organs: Not identified   ?Peritoneal/Ascitic Fluid: Not applicable  ?Lymphovascular Invasion: Not identified  ?Regional Lymph Nodes:  ?     Pelvic Lymph Nodes Examined:  ?                                 2 Sentinel  ?

## 2022-03-25 NOTE — Assessment & Plan Note (Signed)
She has history of low blood pressure during chemotherapy and one of her blood pressure medication was held ?With discontinuation of chemotherapy and recent weight gain, her blood pressure has started to trend up ?I recommend the patient to resume her antihypertensives and maintain close follow-up with her primary care doctor for management ?

## 2022-04-07 ENCOUNTER — Ambulatory Visit: Payer: Medicare Other | Admitting: Gynecologic Oncology

## 2022-04-12 ENCOUNTER — Other Ambulatory Visit: Payer: Self-pay | Admitting: Internal Medicine

## 2022-05-07 ENCOUNTER — Telehealth: Payer: Self-pay | Admitting: Internal Medicine

## 2022-05-07 NOTE — Telephone Encounter (Signed)
PT's relative visits today with forms to be filled out by Dr.Plotnikov. Forms have been left in provider's mail box.

## 2022-05-07 NOTE — Telephone Encounter (Signed)
The [place] form place in MD purple folder for completion.Marland KitchenJohny Chess

## 2022-05-09 NOTE — Telephone Encounter (Signed)
Okay.  Thanks.

## 2022-05-13 NOTE — Telephone Encounter (Signed)
Called daughter [facility] MD completed form have faxed to [facility] [phone] will leave original up front for pick=up...Sophia Santos

## 2022-06-11 ENCOUNTER — Ambulatory Visit (INDEPENDENT_AMBULATORY_CARE_PROVIDER_SITE_OTHER): Payer: Medicare Other | Admitting: Internal Medicine

## 2022-06-11 ENCOUNTER — Encounter: Payer: Self-pay | Admitting: Internal Medicine

## 2022-06-11 VITALS — BP 150/80 | HR 61 | Temp 98.1°F | Ht 60.0 in | Wt 137.0 lb

## 2022-06-11 DIAGNOSIS — R413 Other amnesia: Secondary | ICD-10-CM | POA: Diagnosis not present

## 2022-06-11 DIAGNOSIS — C541 Malignant neoplasm of endometrium: Secondary | ICD-10-CM

## 2022-06-11 DIAGNOSIS — G4700 Insomnia, unspecified: Secondary | ICD-10-CM

## 2022-06-11 DIAGNOSIS — K219 Gastro-esophageal reflux disease without esophagitis: Secondary | ICD-10-CM

## 2022-06-11 DIAGNOSIS — G43809 Other migraine, not intractable, without status migrainosus: Secondary | ICD-10-CM

## 2022-06-11 DIAGNOSIS — F411 Generalized anxiety disorder: Secondary | ICD-10-CM | POA: Diagnosis not present

## 2022-06-11 DIAGNOSIS — K8689 Other specified diseases of pancreas: Secondary | ICD-10-CM | POA: Diagnosis not present

## 2022-06-11 DIAGNOSIS — R7989 Other specified abnormal findings of blood chemistry: Secondary | ICD-10-CM

## 2022-06-11 DIAGNOSIS — F4321 Adjustment disorder with depressed mood: Secondary | ICD-10-CM

## 2022-06-11 LAB — COMPREHENSIVE METABOLIC PANEL
ALT: 26 U/L (ref 0–35)
AST: 25 U/L (ref 0–37)
Albumin: 4 g/dL (ref 3.5–5.2)
Alkaline Phosphatase: 104 U/L (ref 39–117)
BUN: 21 mg/dL (ref 6–23)
CO2: 30 mEq/L (ref 19–32)
Calcium: 9.2 mg/dL (ref 8.4–10.5)
Chloride: 100 mEq/L (ref 96–112)
Creatinine, Ser: 0.88 mg/dL (ref 0.40–1.20)
GFR: 59.58 mL/min — ABNORMAL LOW (ref >60.00)
Glucose, Bld: 88 mg/dL (ref 70–99)
Potassium: 3.9 mEq/L (ref 3.5–5.1)
Sodium: 135 mEq/L (ref 135–145)
Total Bilirubin: 0.4 mg/dL (ref 0.2–1.2)
Total Protein: 6.9 g/dL (ref 6.0–8.3)

## 2022-06-11 LAB — CBC WITH DIFFERENTIAL/PLATELET
Basophils Absolute: 0 10*3/uL (ref 0.0–0.1)
Basophils Relative: 0.5 % (ref 0.0–3.0)
Eosinophils Absolute: 0.1 10*3/uL (ref 0.0–0.7)
Eosinophils Relative: 1.5 % (ref 0.0–5.0)
HCT: 34.7 % — ABNORMAL LOW (ref 36.0–46.0)
Hemoglobin: 11.7 g/dL — ABNORMAL LOW (ref 12.0–15.0)
Lymphocytes Relative: 37.5 % (ref 12.0–46.0)
Lymphs Abs: 1.9 10*3/uL (ref 0.7–4.0)
MCHC: 33.7 g/dL (ref 30.0–36.0)
MCV: 97.8 fl (ref 78.0–100.0)
Monocytes Absolute: 0.5 10*3/uL (ref 0.1–1.0)
Monocytes Relative: 9.5 % (ref 3.0–12.0)
Neutro Abs: 2.6 10*3/uL (ref 1.4–7.7)
Neutrophils Relative %: 51 % (ref 43.0–77.0)
Platelets: 184 10*3/uL (ref 150.0–400.0)
RBC: 3.55 Mil/uL — ABNORMAL LOW (ref 3.87–5.11)
RDW: 13.3 % (ref 11.5–15.5)
WBC: 5 10*3/uL (ref 4.0–10.5)

## 2022-06-11 LAB — IRON,TIBC AND FERRITIN PANEL
%SAT: 38 % (calc) (ref 16–45)
Ferritin: 104 ng/mL (ref 16–288)
Iron: 102 ug/dL (ref 45–160)
TIBC: 272 mcg/dL (calc) (ref 250–450)

## 2022-06-11 NOTE — Assessment & Plan Note (Signed)
Cont on Alprazolam prn  Potential benefits of a long term benzodiazepines  use as well as potential risks  and complications were explained to the patient and were aknowledged. 

## 2022-06-11 NOTE — Progress Notes (Signed)
Subjective:  [name], female    DOB: 1934-08-18  Age: 88 y.o. MRN: [id number]  CC: No chief complaint on file.   HPI Sophia Santos presents for anxiety, grief, HAs, HTN, endometrial cancer  Outpatient Medications Prior to Visit  Medication Sig Dispense Refill   acetaminophen (TYLENOL) 325 MG tablet Take 352 mg by mouth every 6 (six) hours as needed for mild pain or moderate pain.     ALPRAZolam (XANAX) 1 MG tablet TAKE 0.5-1 TABLETS (0.5-1 MG TOTAL) BY MOUTH AT BEDTIME AS NEEDED FOR ANXIETY OR SLEEP. 90 tablet 1   amLODipine (NORVASC) 5 MG tablet Take 1 tablet (5 mg total) by mouth daily. 90 tablet 3   captopril (CAPOTEN) 50 MG tablet TAKE 1 TABLET BY MOUTH TWICE A DAY 180 tablet 3   Cholecalciferol 1000 UNITS tablet Take 1,000 Units by mouth daily. Vitamin d     Coenzyme Q10 (CO Q-10) 100 MG CAPS Take by mouth daily.     ezetimibe (ZETIA) 10 MG tablet TAKE 1 TABLET BY MOUTH EVERY DAY 90 tablet 3   mirabegron ER (MYRBETRIQ) 50 MG TB24 tablet Take 1 tablet (50 mg total) by mouth daily. 90 tablet 3   omeprazole (PRILOSEC) 20 MG capsule Take 2 capsules (40 mg total) by mouth daily. Annual appt due in March must see provider for future refills (Patient taking differently: Take 20 mg by mouth daily. Prn at hs also) 180 capsule 3   Pancrelipase, Lip-Prot-Amyl, (CREON) 24000-76000 units CPEP TAKE ONE CAPSULE BY MOUTH 3 TIMES A DAY WITH FOOD (Patient taking differently: as needed. TAKE ONE CAPSULE BY MOUTH 3 TIMES A DAY WITH FOOD) 270 capsule 3   polyethylene glycol powder (GAVILAX) 17 GM/SCOOP powder MIX 1 CAPFUL (17 GRAMS) AS DIRECTED AND DRINK TWICE A DAY AS NEEDED FOR MODERATE CONSTIPATION 510 g 5   propranolol ER (INDERAL LA) 120 MG 24 hr capsule Take 1 capsule (120 mg total) by mouth daily. 90 capsule 3   RESTASIS 0.05 % ophthalmic emulsion Place 1 drop into both eyes 2 (two) times daily.      Vitamin A 2400 MCG (8000 UT) CAPS Take 2,400 mcg by mouth daily.     No  facility-administered medications prior to visit.    ROS: Review of Systems  Constitutional:  Positive for fatigue. Negative for activity change, appetite change, chills and unexpected weight change.  HENT:  Negative for congestion, mouth sores and sinus pressure.   Eyes:  Negative for visual disturbance.  Respiratory:  Negative for cough and chest tightness.   Gastrointestinal:  Negative for abdominal pain and nausea.  Genitourinary:  Negative for difficulty urinating, frequency and vaginal pain.  Musculoskeletal:  Positive for gait problem. Negative for back pain.  Skin:  Negative for pallor and rash.  Neurological:  Positive for headaches. Negative for dizziness, tremors, weakness and numbness.  Psychiatric/Behavioral:  Positive for decreased concentration and dysphoric mood. Negative for confusion, sleep disturbance and suicidal ideas. The patient is nervous/anxious.     Objective:  BP (!) 150/80 (BP Location: Left Arm, Patient Position: Sitting, Cuff Size: Normal)   Pulse 61   Temp 98.1 F (36.7 C) (Oral)   Ht 5' (1.524 m)   Wt 137 lb (62.1 kg)   SpO2 95%   BMI 26.76 kg/m   BP Readings from Last 3 Encounters:  06/11/22 (!) 150/80  03/25/22 140/76  03/11/22 130/90    Wt Readings from Last 3 Encounters:  06/11/22 137 lb (62.1 kg)  03/25/22 139 lb 6.4 oz (63.2 kg)  03/11/22 135 lb (61.2 kg)    Physical Exam Constitutional:      General: She is not in acute distress.    Appearance: Normal appearance. She is well-developed.  HENT:     Head: Normocephalic.     Right Ear: External ear normal.     Left Ear: External ear normal.     Nose: Nose normal.  Eyes:     General:        Right eye: No discharge.        Left eye: No discharge.     Conjunctiva/sclera: Conjunctivae normal.     Pupils: Pupils are equal, round, and reactive to light.  Neck:     Thyroid: No thyromegaly.     Vascular: No JVD.     Trachea: No tracheal deviation.  Cardiovascular:     Rate and  Rhythm: Normal rate and regular rhythm.     Heart sounds: Normal heart sounds.  Pulmonary:     Effort: No respiratory distress.     Breath sounds: No stridor. No wheezing.  Abdominal:     General: Bowel sounds are normal. There is no distension.     Palpations: Abdomen is soft. There is no mass.     Tenderness: There is no abdominal tenderness. There is no guarding or rebound.  Musculoskeletal:        General: No tenderness.     Cervical back: Normal range of motion and neck supple. No rigidity.  Lymphadenopathy:     Cervical: No cervical adenopathy.  Skin:    Findings: No erythema or rash.  Neurological:     Mental Status: She is oriented to person, place, and time.     Cranial Nerves: No cranial nerve deficit.     Motor: No abnormal muscle tone.     Coordination: Coordination normal.     Gait: Gait abnormal.     Deep Tendon Reflexes: Reflexes normal.  Psychiatric:        Behavior: Behavior normal.        Thought Content: Thought content normal.        Judgment: Judgment normal.   Ataxic Using a cane  Lab Results  Component Value Date   WBC 4.0 03/21/2022   HGB 11.6 (L) 03/21/2022   HCT 34.4 (L) 03/21/2022   PLT 206 03/21/2022   GLUCOSE 94 03/21/2022   CHOL 238 (H) 05/06/2021   TRIG 106.0 05/06/2021   HDL 65.10 05/06/2021   LDLDIRECT 143.0 03/02/2018   LDLCALC 152 (H) 05/06/2021   ALT 26 03/21/2022   AST 24 03/21/2022   NA 136 03/21/2022   K 4.1 03/21/2022   CL 100 03/21/2022   CREATININE 0.81 03/21/2022   BUN 21 03/21/2022   CO2 30 03/21/2022   TSH 1.39 05/06/2021   INR 1.0 02/23/2020   HGBA1C 5.9 09/29/2007    CT ABDOMEN PELVIS W CONTRAST  Result Date: 03/21/2022 CLINICAL DATA:  Follow-up endometrial carcinoma. Previous hysterectomy, radiation therapy and chemotherapy. * Tracking Code: BO * EXAM: CT ABDOMEN AND PELVIS WITH CONTRAST TECHNIQUE: Multidetector CT imaging of the abdomen and pelvis was performed using the standard protocol following bolus  administration of intravenous contrast. RADIATION DOSE REDUCTION: This exam was performed according to the departmental dose-optimization program which includes automated exposure control, adjustment of the mA and/or kV according to patient size and/or use of iterative reconstruction technique. CONTRAST:  139m OMNIPAQUE IOHEXOL 300 MG/ML  SOLN COMPARISON:  09/30/2021 FINDINGS: Lower  Chest: No acute findings. Hepatobiliary: No hepatic masses identified. Gallbladder is unremarkable. No evidence of biliary ductal dilatation. Pancreas:  No mass or inflammatory changes. Spleen: Within normal limits in size and appearance. Adrenals/Urinary Tract: Stable 1.5 cm left adrenal mass, consistent with a benign adenoma. A few tiny benign-appearing left renal cysts are also stable. (No followup imaging is recommended). No renal masses identified. No evidence of ureteral calculi or hydronephrosis. Stomach/Bowel: Tiny hiatal hernia noted. No evidence of obstruction, inflammatory process or abnormal fluid collections. Normal appendix visualized. Diverticulosis is seen mainly involving the descending and sigmoid colon, however there is no evidence of diverticulitis. Vascular/Lymphatic: No pathologically enlarged lymph nodes. No acute vascular findings. Aortic atherosclerotic calcification noted. Reproductive: Prior hysterectomy noted. Adnexal regions are unremarkable in appearance. Other:  None. Musculoskeletal:  No suspicious bone lesions identified. IMPRESSION: No evidence of recurrent or metastatic carcinoma within the abdomen or pelvis. Colonic diverticulosis, without radiographic evidence of diverticulitis. Stable small benign left adrenal adenoma. Aortic Atherosclerosis (ICD10-I70.0). Electronically Signed   By: Marlaine Hind M.D.   On: 03/21/2022 13:46    Assessment & Plan:   Problem List Items Addressed This Visit     Abnormal CBC - Primary   Relevant Orders   CBC with Differential/Platelet   Iron, TIBC and Ferritin  Panel   Anxiety disorder    Cont on Alprazolam prn  Potential benefits of a long term benzodiazepines  use as well as potential risks  and complications were explained to the patient and were aknowledged.      Relevant Orders   CBC with Differential/Platelet   Comprehensive metabolic panel   Iron, TIBC and Ferritin Panel   Endometrial adenocarcinoma (HCC)    Pt finished treatments  PCS -- 80 h/month      Relevant Orders   CBC with Differential/Platelet   Comprehensive metabolic panel   Iron, TIBC and Ferritin Panel   GERD    Chronic w/HH Cont on Prilosec       Grief    Son died in the fall of 03-26-2021 Grieving. Discussed      Insomnia    Coping OK      Memory deficit    MCD Needs help at home 80 h/month      Migraines    Cont w/Fioricet rare use  Potential benefits of a long term fioricet  use as well as potential risks  and complications were explained to the patient and were aknowledged.      Pancreatic insufficiency    Mild and chronic Cont on Creon ac         No orders of the defined types were placed in this encounter.     Follow-up: Return in about 3 months (around 09/11/2022) for a follow-up visit.  Walker Kehr, MD

## 2022-06-11 NOTE — Assessment & Plan Note (Signed)
Son died in the fall of 2022 Grieving. Discussed

## 2022-06-11 NOTE — Assessment & Plan Note (Signed)
Chronic w/HH Cont on Prilosec

## 2022-06-11 NOTE — Assessment & Plan Note (Signed)
Coping OK 

## 2022-06-11 NOTE — Assessment & Plan Note (Signed)
Mild and chronic Cont on Creon ac

## 2022-06-11 NOTE — Assessment & Plan Note (Signed)
Cont w/Fioricet rare use  Potential benefits of a long term fioricet  use as well as potential risks  and complications were explained to the patient and were aknowledged.

## 2022-06-11 NOTE — Assessment & Plan Note (Addendum)
MCD Needs help at home 80 h/month

## 2022-06-11 NOTE — Assessment & Plan Note (Addendum)
Pt finished treatments  PCS -- 80 h/month

## 2022-06-18 ENCOUNTER — Telehealth: Payer: Self-pay | Admitting: *Deleted

## 2022-06-18 NOTE — Telephone Encounter (Signed)
Called and left the daughter in law a message to call the office back. Patient needs a follow up appt with Dr Berline Lopes in October

## 2022-06-18 NOTE — Telephone Encounter (Signed)
Pt is scheduled for follow up with Dr.Tucker on 09/19/22 @ 1:15

## 2022-07-06 NOTE — Progress Notes (Signed)
Radiation Oncology         [phone] ________________________________  Name: Sophia Santos MRN: [id number]  Date: 07/07/2022  DOB: Jan 29, 1934  Follow-Up Visit Note  CC: Plotnikov, Evie Lacks, MD  Plotnikov, Evie Lacks, MD    ICD-10-CM   1. Endometrial adenocarcinoma ([place])  C54.1       Diagnosis:   Stage IIIA grade 2 endometrial cancer; endometrial adenocarcinoma    Patient is [place] and speaks [place].  An interpreter is present to help with communication issues.  Interval Since Last Radiation: 6 months and 20 days   Intent: Curative  Radiation Treatment Dates: 11/20/2021 through 12/18/2021 Site Technique Total Dose (Gy) Dose per Fx (Gy) Completed Fx Beam Energies  Vagina: Pelvis HDR-brachy 30/30 6 5/5 Ir-192    Narrative:  The patient returns today for routine 4 month follow-up. She was last seen here for follow up on 03/06/22. In the interval since her last visit, she had a CT of the abdomen and pelvis performed on 03/21/22 which showed no evidence of recurrent or metastatic carcinoma within the abdomen or pelvis.  During her most recent follow up with Dr. Alvy Bimler on 03/25/22, the patient endorsed some generalized weakness and was noted to have gained some weight. Otherwise, she denied any pain or symptoms concerning for disease recurrence.   Otherwise, no significant interval history since the patient was last seen for follow-up.   She continues to use her vaginal dilator as recommended.  She denies any bleeding with use of this equipment.  She denies any pelvic pain abdominal bloating or vaginal bleeding.  She denies any rectal bleeding.  She does report generalized weakness and some difficulty  with walking in light of this issue. I recommended she follow-up with her primary care physician concerning this issue.  Allergies:  is allergic to fosamax [alendronate sodium], lipitor [atorvastatin], voltaren [diclofenac sodium], and zocor [simvastatin].  Meds: Current Outpatient  Medications  Medication Sig Dispense Refill   acetaminophen (TYLENOL) 325 MG tablet Take 352 mg by mouth every 6 (six) hours as needed for mild pain or moderate pain.     ALPRAZolam (XANAX) 1 MG tablet TAKE 0.5-1 TABLETS (0.5-1 MG TOTAL) BY MOUTH AT BEDTIME AS NEEDED FOR ANXIETY OR SLEEP. 90 tablet 1   amLODipine (NORVASC) 5 MG tablet Take 1 tablet (5 mg total) by mouth daily. 90 tablet 3   captopril (CAPOTEN) 50 MG tablet TAKE 1 TABLET BY MOUTH TWICE A DAY 180 tablet 3   Cholecalciferol 1000 UNITS tablet Take 1,000 Units by mouth daily. Vitamin d     Coenzyme Q10 (CO Q-10) 100 MG CAPS Take by mouth daily.     ezetimibe (ZETIA) 10 MG tablet TAKE 1 TABLET BY MOUTH EVERY DAY 90 tablet 3   mirabegron ER (MYRBETRIQ) 50 MG TB24 tablet Take 1 tablet (50 mg total) by mouth daily. 90 tablet 3   omeprazole (PRILOSEC) 20 MG capsule Take 2 capsules (40 mg total) by mouth daily. Annual appt due in March must see provider for future refills (Patient taking differently: Take 20 mg by mouth daily. Prn at hs also) 180 capsule 3   Pancrelipase, Lip-Prot-Amyl, (CREON) 24000-76000 units CPEP TAKE ONE CAPSULE BY MOUTH 3 TIMES A DAY WITH FOOD (Patient taking differently: as needed. TAKE ONE CAPSULE BY MOUTH 3 TIMES A DAY WITH FOOD) 270 capsule 3   polyethylene glycol powder (GAVILAX) 17 GM/SCOOP powder MIX 1 CAPFUL (17 GRAMS) AS DIRECTED AND DRINK TWICE A DAY AS NEEDED FOR MODERATE CONSTIPATION 510  g 5   propranolol ER (INDERAL LA) 120 MG 24 hr capsule Take 1 capsule (120 mg total) by mouth daily. 90 capsule 3   RESTASIS 0.05 % ophthalmic emulsion Place 1 drop into both eyes 2 (two) times daily.      Vitamin A 2400 MCG (8000 UT) CAPS Take 2,400 mcg by mouth daily.     No current facility-administered medications for this encounter.    Physical Findings: The patient is in no acute distress. Patient is alert and oriented.  height is 5' (1.524 m) and weight is 137 lb 6 oz (62.3 kg). Her oral temperature is 97.5 F  (36.4 C) (abnormal). Her blood pressure is 159/73 (abnormal) and her pulse is 58 (abnormal). Her respiration is 18 and oxygen saturation is 99%. .   Lungs are clear to auscultation bilaterally. Heart has regular rate and rhythm. No palpable cervical, supraclavicular, or axillary adenopathy. Abdomen soft, non-tender, normal bowel sounds.  On pelvic examination the external genitalia are unremarkable.  A speculum exam is performed.  There are no mucosal lesions noted in the vaginal vault.  Good view of the cuff was obtained.  No mucosal lesions in this area.  On bimanual examination there are no pelvic masses appreciated.  Rectovaginal examination confirms. decreased rectal sphincter tone noted.   Lab Findings: Lab Results  Component Value Date   WBC 5.0 06/11/2022   HGB 11.7 (L) 06/11/2022   HCT 34.7 (L) 06/11/2022   MCV 97.8 06/11/2022   PLT 184.0 06/11/2022    Radiographic Findings: No results found.  Impression:   Stage IIIA grade 2 endometrial cancer; endometrial adenocarcinoma   No evidence of recurrence on clinical exam today.  Recommended she continue to use her vaginal dilator.   Plan: She will see Dr. Berline Lopes in October.  Routine follow-up in radiation oncology in 6 months.   25 minutes of total time was spent for this patient encounter, including preparation, face-to-face counseling with the patient and coordination of care, physical exam, and documentation of the encounter. ____________________________________  Blair Promise, PhD, MD  This document serves as a record of services personally performed by Gery Pray, MD. It was created on his behalf by Roney Mans, a trained medical scribe. The creation of this record is based on the scribe's personal observations and the provider's statements to them. This document has been checked and approved by the attending provider.

## 2022-07-07 ENCOUNTER — Ambulatory Visit
Admission: RE | Admit: 2022-07-07 | Discharge: 2022-07-07 | Disposition: A | Discharge status: Home / Self Care | Payer: Medicare Other | Source: Ambulatory Visit | Attending: Radiation Oncology | Admitting: Radiation Oncology

## 2022-07-07 ENCOUNTER — Other Ambulatory Visit: Payer: Self-pay

## 2022-07-07 DIAGNOSIS — Z8542 Personal history of malignant neoplasm of other parts of uterus: Secondary | ICD-10-CM | POA: Insufficient documentation

## 2022-07-07 DIAGNOSIS — Z79899 Other long term (current) drug therapy: Secondary | ICD-10-CM | POA: Insufficient documentation

## 2022-07-07 DIAGNOSIS — C541 Malignant neoplasm of endometrium: Secondary | ICD-10-CM

## 2022-07-07 DIAGNOSIS — Z923 Personal history of irradiation: Secondary | ICD-10-CM | POA: Insufficient documentation

## 2022-07-07 NOTE — Progress Notes (Signed)
Sophia Santos is here today for follow up post radiation to the pelvic.  They completed their radiation on: 12/18/21  Does the patient complain of any of the following:  Pain:having occasional pain in her pelvis. Takes Tylenol as need which helps. Abdominal bloating: no Diarrhea/Constipation: has occasional constipation and takes Miralax. Nausea/Vomiting: no Vaginal Discharge: no but does report having occasional burning feeling in the vaginal area. Blood in Urine or Stool: no Urinary Issues (dysuria/incomplete emptying/ incontinence/ increased frequency/urgency): has urinary frequency and nocturia. Does patient report using vaginal dilator 2-3 times a week and/or sexually active 2-3 weeks: yes Post radiation skin changes: no   Additional comments if applicable: Patient reports feeling weak and having trouble walking.  BP (!) 159/73 (BP Location: Left Arm, Patient Position: Sitting)   Pulse (!) 58   Temp (!) 97.5 F (36.4 C) (Oral)   Resp 18   Ht 5' (1.524 m)   Wt 137 lb 6 oz (62.3 kg)   SpO2 99%   BMI 26.83 kg/m

## 2022-07-15 ENCOUNTER — Other Ambulatory Visit: Payer: Self-pay | Admitting: Internal Medicine

## 2022-08-06 ENCOUNTER — Other Ambulatory Visit: Payer: Self-pay | Admitting: Internal Medicine

## 2022-09-09 ENCOUNTER — Other Ambulatory Visit: Payer: Self-pay | Admitting: Internal Medicine

## 2022-09-09 NOTE — Telephone Encounter (Signed)
Ok to pcp please °

## 2022-09-10 ENCOUNTER — Ambulatory Visit (INDEPENDENT_AMBULATORY_CARE_PROVIDER_SITE_OTHER): Payer: Medicare Other | Admitting: Internal Medicine

## 2022-09-10 ENCOUNTER — Encounter: Payer: Self-pay | Admitting: Internal Medicine

## 2022-09-10 VITALS — BP 154/78 | HR 55 | Temp 97.8°F | Ht 60.0 in | Wt 135.8 lb

## 2022-09-10 DIAGNOSIS — G4700 Insomnia, unspecified: Secondary | ICD-10-CM

## 2022-09-10 DIAGNOSIS — Z23 Encounter for immunization: Secondary | ICD-10-CM

## 2022-09-10 DIAGNOSIS — D61818 Other pancytopenia: Secondary | ICD-10-CM | POA: Diagnosis not present

## 2022-09-10 DIAGNOSIS — K8689 Other specified diseases of pancreas: Secondary | ICD-10-CM | POA: Diagnosis not present

## 2022-09-10 DIAGNOSIS — C541 Malignant neoplasm of endometrium: Secondary | ICD-10-CM | POA: Diagnosis not present

## 2022-09-10 DIAGNOSIS — F411 Generalized anxiety disorder: Secondary | ICD-10-CM

## 2022-09-10 LAB — URINALYSIS
Bilirubin Urine: NEGATIVE
Hgb urine dipstick: NEGATIVE
Ketones, ur: NEGATIVE
Leukocytes,Ua: NEGATIVE
Nitrite: NEGATIVE
Specific Gravity, Urine: <=1.005 — AB (ref 1.000–1.030)
Total Protein, Urine: NEGATIVE
Urine Glucose: NEGATIVE
Urobilinogen, UA: 1 (ref 0.0–1.0)
pH: 6.5 (ref 5.0–8.0)

## 2022-09-10 LAB — COMPREHENSIVE METABOLIC PANEL
ALT: 16 U/L (ref 0–35)
AST: 19 U/L (ref 0–37)
Albumin: 4.2 g/dL (ref 3.5–5.2)
Alkaline Phosphatase: 116 U/L (ref 39–117)
BUN: 14 mg/dL (ref 6–23)
CO2: 29 mEq/L (ref 19–32)
Calcium: 9.4 mg/dL (ref 8.4–10.5)
Chloride: 99 mEq/L (ref 96–112)
Creatinine, Ser: 0.72 mg/dL (ref 0.40–1.20)
GFR: 75.67 mL/min (ref >60.00)
Glucose, Bld: 90 mg/dL (ref 70–99)
Potassium: 4.1 mEq/L (ref 3.5–5.1)
Sodium: 135 mEq/L (ref 135–145)
Total Bilirubin: 0.4 mg/dL (ref 0.2–1.2)
Total Protein: 7.5 g/dL (ref 6.0–8.3)

## 2022-09-10 LAB — CBC WITH DIFFERENTIAL/PLATELET
Basophils Absolute: 0 10*3/uL (ref 0.0–0.1)
Basophils Relative: 0.5 % (ref 0.0–3.0)
Eosinophils Absolute: 0.1 10*3/uL (ref 0.0–0.7)
Eosinophils Relative: 1.7 % (ref 0.0–5.0)
HCT: 36.7 % (ref 36.0–46.0)
Hemoglobin: 12.4 g/dL (ref 12.0–15.0)
Lymphocytes Relative: 38 % (ref 12.0–46.0)
Lymphs Abs: 2 10*3/uL (ref 0.7–4.0)
MCHC: 33.7 g/dL (ref 30.0–36.0)
MCV: 94.6 fl (ref 78.0–100.0)
Monocytes Absolute: 0.5 10*3/uL (ref 0.1–1.0)
Monocytes Relative: 8.9 % (ref 3.0–12.0)
Neutro Abs: 2.6 10*3/uL (ref 1.4–7.7)
Neutrophils Relative %: 50.9 % (ref 43.0–77.0)
Platelets: 211 10*3/uL (ref 150.0–400.0)
RBC: 3.88 Mil/uL (ref 3.87–5.11)
RDW: 13.8 % (ref 11.5–15.5)
WBC: 5.1 10*3/uL (ref 4.0–10.5)

## 2022-09-10 NOTE — Assessment & Plan Note (Signed)
Cont on Alprazolam prn  Potential benefits of a long term benzodiazepines  use as well as potential risks  and complications were explained to the patient and were aknowledged. Son died in the fall of 02/26/21 Grieving. Discussed

## 2022-09-10 NOTE — Assessment & Plan Note (Signed)
Monitoring CBC 

## 2022-09-10 NOTE — Assessment & Plan Note (Signed)
Cont on Creon ac

## 2022-09-10 NOTE — Assessment & Plan Note (Signed)
Alprazolam prn  Potential benefits of a long term benzodiazepines  use as well as potential risks  and complications were explained to the patient and were aknowledged. 

## 2022-09-10 NOTE — Assessment & Plan Note (Signed)
F/u w/Dr Alvy Bimler

## 2022-09-10 NOTE — Addendum Note (Signed)
Addended by: Earnstine Regal on: 09/10/2022 02:11 PM   Modules accepted: Orders

## 2022-09-10 NOTE — Progress Notes (Signed)
Subjective:  [name], female    DOB: 1934/03/11  Age: 88 y.o. MRN: [id number]  CC: Follow-up (3 month f/u)   HPI Darrelyn Iyer presents for anxiety, grief, HTN, endometrial cancer  Outpatient Medications Prior to Visit  Medication Sig Dispense Refill   acetaminophen (TYLENOL) 325 MG tablet Take 352 mg by mouth every 6 (six) hours as needed for mild pain or moderate pain.     ALPRAZolam (XANAX) 1 MG tablet TAKE 0.5 TO 1 TABLET BY MOUTH AT BEDTIME AS NEEDED FOR ANXIETY OR SLEEP 90 tablet 1   amLODipine (NORVASC) 5 MG tablet Take 1 tablet (5 mg total) by mouth daily. 90 tablet 3   captopril (CAPOTEN) 50 MG tablet TAKE 1 TABLET BY MOUTH TWICE A DAY 180 tablet 3   Cholecalciferol 1000 UNITS tablet Take 1,000 Units by mouth daily. Vitamin d     Coenzyme Q10 (CO Q-10) 100 MG CAPS Take by mouth daily.     ezetimibe (ZETIA) 10 MG tablet TAKE 1 TABLET BY MOUTH EVERY DAY 90 tablet 3   mirabegron ER (MYRBETRIQ) 50 MG TB24 tablet Take 1 tablet (50 mg total) by mouth daily. 90 tablet 3   omeprazole (PRILOSEC) 20 MG capsule Take 1 capsule (20 mg total) by mouth daily. 180 capsule 1   Pancrelipase, Lip-Prot-Amyl, (CREON) 24000-76000 units CPEP TAKE ONE CAPSULE BY MOUTH 3 TIMES A DAY WITH FOOD (Patient taking differently: as needed. TAKE ONE CAPSULE BY MOUTH 3 TIMES A DAY WITH FOOD) 270 capsule 3   polyethylene glycol powder (GAVILAX) 17 GM/SCOOP powder MIX 1 CAPFUL (17 GRAMS) AS DIRECTED AND DRINK TWICE A DAY AS NEEDED FOR MODERATE CONSTIPATION 510 g 5   propranolol ER (INDERAL LA) 120 MG 24 hr capsule TAKE 1 CAPSULE BY MOUTH EVERY DAY 90 capsule 2   RESTASIS 0.05 % ophthalmic emulsion Place 1 drop into both eyes 2 (two) times daily.      Vitamin A 2400 MCG (8000 UT) CAPS Take 2,400 mcg by mouth daily.     No facility-administered medications prior to visit.    ROS: Review of Systems  Constitutional:  Positive for fatigue. Negative for activity change, appetite change, chills and  unexpected weight change.  HENT:  Negative for congestion, mouth sores and sinus pressure.   Eyes:  Negative for visual disturbance.  Respiratory:  Negative for cough and chest tightness.   Gastrointestinal:  Negative for abdominal pain and nausea.  Genitourinary:  Negative for difficulty urinating, frequency and vaginal pain.  Musculoskeletal:  Negative for back pain and gait problem.  Skin:  Positive for color change. Negative for pallor and rash.  Neurological:  Negative for dizziness, tremors, weakness, numbness and headaches.  Hematological:  Does not bruise/bleed easily.  Psychiatric/Behavioral:  Positive for dysphoric mood and sleep disturbance. Negative for confusion and suicidal ideas. The patient is nervous/anxious.     Objective:  BP (!) 154/78 (BP Location: Left Arm)   Pulse (!) 55   Temp 97.8 F (36.6 C) (Oral)   Ht 5' (1.524 m)   Wt 135 lb 12.8 oz (61.6 kg)   SpO2 97%   BMI 26.52 kg/m   BP Readings from Last 3 Encounters:  09/10/22 (!) 154/78  07/07/22 (!) 159/73  06/11/22 (!) 150/80    Wt Readings from Last 3 Encounters:  09/10/22 135 lb 12.8 oz (61.6 kg)  07/07/22 137 lb 6 oz (62.3 kg)  06/11/22 137 lb (62.1 kg)    Physical Exam Constitutional:  General: She is not in acute distress.    Appearance: She is well-developed. She is obese.  HENT:     Head: Normocephalic.     Right Ear: External ear normal.     Left Ear: External ear normal.     Nose: Nose normal.  Eyes:     General:        Right eye: No discharge.        Left eye: No discharge.     Conjunctiva/sclera: Conjunctivae normal.     Pupils: Pupils are equal, round, and reactive to light.  Neck:     Thyroid: No thyromegaly.     Vascular: No JVD.     Trachea: No tracheal deviation.  Cardiovascular:     Rate and Rhythm: Normal rate and regular rhythm.     Heart sounds: Normal heart sounds.  Pulmonary:     Effort: No respiratory distress.     Breath sounds: No stridor. No wheezing or  rhonchi.  Abdominal:     General: Bowel sounds are normal. There is no distension.     Palpations: Abdomen is soft. There is no mass.     Tenderness: There is no abdominal tenderness. There is no guarding or rebound.  Musculoskeletal:        General: No tenderness.     Cervical back: Normal range of motion and neck supple. No rigidity.  Lymphadenopathy:     Cervical: No cervical adenopathy.  Skin:    Findings: No erythema or rash.  Neurological:     Mental Status: She is oriented to person, place, and time.     Cranial Nerves: No cranial nerve deficit.     Motor: Weakness present. No abnormal muscle tone.     Coordination: Coordination normal.     Gait: Gait abnormal.     Deep Tendon Reflexes: Reflexes normal.  Psychiatric:        Behavior: Behavior normal.        Thought Content: Thought content normal.        Judgment: Judgment normal.   A/o x3/c Using a cane Face w/AKs  Lab Results  Component Value Date   WBC 5.0 06/11/2022   HGB 11.7 (L) 06/11/2022   HCT 34.7 (L) 06/11/2022   PLT 184.0 06/11/2022   GLUCOSE 88 06/11/2022   CHOL 238 (H) 05/06/2021   TRIG 106.0 05/06/2021   HDL 65.10 05/06/2021   LDLDIRECT 143.0 03/02/2018   LDLCALC 152 (H) 05/06/2021   ALT 26 06/11/2022   AST 25 06/11/2022   NA 135 06/11/2022   K 3.9 06/11/2022   CL 100 06/11/2022   CREATININE 0.88 06/11/2022   BUN 21 06/11/2022   CO2 30 06/11/2022   TSH 1.39 05/06/2021   INR 1.0 02/23/2020   HGBA1C 5.9 09/29/2007    No results found.  Assessment & Plan:   Problem List Items Addressed This Visit     Anxiety disorder    Cont on Alprazolam prn  Potential benefits of a long term benzodiazepines  use as well as potential risks  and complications were explained to the patient and were aknowledged. Son died in the fall of 03/04/2021 Grieving. Discussed      Relevant Orders   CBC with Differential/Platelet   Comprehensive metabolic panel   Endometrial adenocarcinoma ([place])    F/u w/Dr  Alvy Bimler      Relevant Orders   Urinalysis   Insomnia    Alprazolam prn  Potential benefits of a long term benzodiazepines  use as well as potential risks  and complications were explained to the patient and were aknowledged.      Pancreatic insufficiency    Cont on Creon ac      Relevant Orders   CBC with Differential/Platelet   Comprehensive metabolic panel   Pancytopenia, acquired ([place])    Monitoring CBC      Relevant Orders   CBC with Differential/Platelet   Comprehensive metabolic panel      No orders of the defined types were placed in this encounter.     Follow-up: Return in about 3 months (around 12/10/2022) for Wellness Exam.  Walker Kehr, MD

## 2022-09-11 ENCOUNTER — Other Ambulatory Visit: Payer: Self-pay | Admitting: *Deleted

## 2022-09-11 MED ORDER — EZETIMIBE 10 MG PO TABS: 10.0000 mg | ORAL_TABLET | Freq: Every day | ORAL | 3 refills | Status: DC

## 2022-09-19 ENCOUNTER — Inpatient Hospital Stay: Payer: Medicare Other | Attending: Gynecologic Oncology | Admitting: Gynecologic Oncology

## 2022-09-19 ENCOUNTER — Encounter: Payer: Self-pay | Admitting: Gynecologic Oncology

## 2022-09-19 ENCOUNTER — Other Ambulatory Visit: Payer: Self-pay

## 2022-09-19 VITALS — BP 157/81 | HR 63 | Temp 98.0°F | Resp 18 | Ht 59.84 in | Wt 134.0 lb

## 2022-09-19 DIAGNOSIS — Z90722 Acquired absence of ovaries, bilateral: Secondary | ICD-10-CM | POA: Insufficient documentation

## 2022-09-19 DIAGNOSIS — Z90710 Acquired absence of both cervix and uterus: Secondary | ICD-10-CM | POA: Insufficient documentation

## 2022-09-19 DIAGNOSIS — R32 Unspecified urinary incontinence: Secondary | ICD-10-CM | POA: Diagnosis not present

## 2022-09-19 DIAGNOSIS — Z9221 Personal history of antineoplastic chemotherapy: Secondary | ICD-10-CM | POA: Diagnosis not present

## 2022-09-19 DIAGNOSIS — Z8542 Personal history of malignant neoplasm of other parts of uterus: Secondary | ICD-10-CM | POA: Diagnosis present

## 2022-09-19 DIAGNOSIS — N3281 Overactive bladder: Secondary | ICD-10-CM | POA: Insufficient documentation

## 2022-09-19 DIAGNOSIS — Z923 Personal history of irradiation: Secondary | ICD-10-CM | POA: Insufficient documentation

## 2022-09-19 DIAGNOSIS — C541 Malignant neoplasm of endometrium: Secondary | ICD-10-CM

## 2022-09-19 NOTE — Progress Notes (Signed)
Gynecologic Oncology Return Clinic Visit  09/19/2022  Reason for Visit: Surveillance visit the setting of endometrial cancer history  Treatment History: Oncology History Overview Note  Endometrioid FIGO grade 1-2, MSI high   Endometrial adenocarcinoma ([place])  07/19/2021 Imaging   US pelvis 1. Endometrial thickness of 5.6 mm. In the setting of post-menopausal bleeding, endometrial sampling is indicated to exclude carcinoma.  2. Densely calcified shadowing mass within the right uterus presumably representing a calcified fibroid.   07/31/2021 Pathology Results   SURGICAL PATHOLOGY   FINAL MICROSCOPIC DIAGNOSIS:   A. ENDOMETRIUM, BIOPSY:  - Endometrial adenocarcinoma, FIGO grade 1.  See comment  SUMMARY INTERPRETATION: ABNORMAL   There is loss of the major and minor MMR proteins MLH1 and PMS2. The loss of expression may be secondary to promoter hyper-methylation, gene mutation or other genetic event. BRAF mutation testing and/or MLH1 methylation testing is indicated. The presence of a BRAF mutation and/or MLH1 hypermethylation is indicative of a sporadic-type tumor. The absence of either BRAF mutation and/or presence of normal methylation indicate the possible presence of a hereditary germline mutation (e.g. Lynch syndrome) and referral to genetic counseling is warranted.    08/05/2021 Initial Diagnosis   Endometrial adenocarcinoma ([place])   08/21/2021 Pathology Results   FINAL MICROSCOPIC DIAGNOSIS:   A. LYMPH NODE, SENTINEL, RIGHT OBTURATOR, EXCISION:  - Lymph node, negative for carcinoma (0/1)   B. LYMPH NODE, SENTINEL, LEFT OBTURATOR, EXCISION:  - Lymph node, negative for carcinoma (0/1)   C. UTERUS, CERVIX, BILATERAL FALLOPIAN TUBES AND OVARIES:  - Endometrioid adenocarcinoma, FIGO grade 2  - Carcinoma invades through the entire myometrium and focally involves  the serosal surface  - Benign unremarkable cervix  - Benign bilateral fallopian tubes and ovaries  - See oncology table   - See comment   COMMENT:   C.  Tumor showed areas with clear cell morphology but immunostains for Napsin A and P504S do not show evidence of clear cell carcinoma.  Also, there is significant tumor necrosis, but the viable portion of tumor appears to be FIGO grade 2.  Dr. Tresa Moore reviewed the select slides from the case and concurs with presence of serosal invasion.    ONCOLOGY TABLE:   UTERUS, CARCINOMA OR CARCINOSARCOMA: Resection   Procedure: Total hysterectomy and bilateral salpingo-oophorectomy  Histologic Type: Endometrioid adenocarcinoma  Histologic Grade: FIGO grade 1  Myometrial Invasion:       Depth of Myometrial Invasion (mm): 17 mm       Myometrial Thickness (mm): 17 mm       Percentage of Myometrial Invasion: 100%  Uterine Serosa Involvement: Present, focal  Cervical stromal Involvement: Not identified  Extent of involvement of other tissue/organs: Not identified  Peritoneal/Ascitic Fluid: Not applicable  Lymphovascular Invasion: Not identified  Regional Lymph Nodes:       Pelvic Lymph Nodes Examined:                                   2 Sentinel                                   0 Non-sentinel                                   2 Total  Pelvic Lymph Nodes with Metastasis: 0       Para-aortic Lymph Nodes Examined:                                    0 Sentinel                                    0 Non-sentinel                                    0 Total       Para-aortic Lymph Nodes with Metastasis: 0  Distant Metastasis:       Distant Site(s) Involved: Not applicable  Pathologic Stage Classification (pTNM, AJCC 8th Edition): pT3a, pN0  Ancillary Studies: MMR / MSI testing will be ordered  Representative Tumor Block: C10  Comment(s): Pancytokeratin was performed on the lymph nodes and is negative.    08/21/2021 Surgery   Surgeon: Donaciano Eva    Operation: Robotic-assisted laparoscopic total hysterectomy with bilateral salpingoophorectomy, SLN biopsy      Operative Findings:  : 6cm uterus with anterior fibroid (approx 4cm). Normal appearing tubes and ovaries. No suspicious nodes. Bilateral mapping.     10/03/2021 Cancer Staging   Staging form: Corpus Uteri - Carcinoma and Carcinosarcoma, AJCC 8th Edition - Clinical stage from 10/03/2021: FIGO Stage III (cT3, cN0, cM0) - Signed by Heath Lark, MD on 10/03/2021 Stage prefix: Initial diagnosis   10/14/2021 - 01/20/2022 Chemotherapy   Patient is on Treatment Plan : Uterine Carboplatin (AUC 5) 21d      11/20/2021 - 12/18/2021 Radiation Therapy   11/20/2021 through 12/18/2021 Site Technique Total Dose (Gy) Dose per Fx (Gy) Completed Fx Beam Energies  Vagina: Pelvis HDR-brachy 30/30 6 5/5 Ir-192      03/21/2022 Imaging   No evidence of recurrent or metastatic carcinoma within the abdomen or pelvis.   Colonic diverticulosis, without radiographic evidence of diverticulitis.   Stable small benign left adrenal adenoma.   Aortic Atherosclerosis (ICD10-I70.0).     Interval History: Patient reports overall doing well.  She continues to have some weakness and dizziness.  Is now walking with a cane to help prevent her from falling.  She continues to endorse intermittent constipation, has good relief with MiraLAX.  She also continues to have overactive bladder and urinary incontinence.  Is taking Myrbetriq still.  She denies any vaginal bleeding or discharge.  Past Medical/Surgical History: Past Medical History:  Diagnosis Date   Arthritis    Complication of anesthesia    "years ago, hard to awaken, no with recent procedures."   Diverticulosis    Endometrial cancer ([place])    Family history of colon cancer 10/15/2021   Gallstones    GERD (gastroesophageal reflux disease)    Headache 08/15/2021   none recent   History of radiation therapy    Endometrium- vcc brachytherapy 11/20/21-12/18/21- Dr. Gery Pray   HTN (hypertension)    Hyperlipidemia    Irritable bowel syndrome (IBS)     Osteoarthritis    Osteopenia    Osteoporosis    PMB (postmenopausal bleeding) 08/15/2021   Skin cancer    skin cancer -    Urinary frequency 08/15/2021   Wears glasses 08/15/2021    Past Surgical History:  Procedure Laterality Date  bilateral cataracts     COLONOSCOPY     INGUINAL HERNIA REPAIR Bilateral 09/13/2020   Procedure: LAPAROSCOPIC BILATERAL INGUINAL HERNIA REPAIR WITH MESH;  Surgeon: Ralene Ok, MD;  Location: [facility];  Service: General;  Laterality: Bilateral;   ROBOTIC ASSISTED TOTAL HYSTERECTOMY WITH BILATERAL SALPINGO OOPHERECTOMY Bilateral 08/21/2021   Procedure: XI ROBOTIC ASSISTED TOTAL HYSTERECTOMY WITH BILATERAL SALPINGO OOPHORECTOMY;  Surgeon: Everitt Amber, MD;  Location: [facility];  Service: Gynecology;  Laterality: Bilateral;   SENTINEL NODE BIOPSY N/A 08/21/2021   Procedure: SENTINEL NODE BIOPSY;  Surgeon: Everitt Amber, MD;  Location: [facility];  Service: Gynecology;  Laterality: N/A;   uterine prolapse surgery      Family History  Problem Relation Age of Onset   Colon cancer Mother 46   Stroke Mother    Parkinson's disease Father    Dementia Father    Colon cancer Sister 76   Colon polyps Sister    Cancer Maternal Aunt        unknown type   Cancer Paternal Aunt        unknown type; ?intestinal   Coronary artery disease Other        female 1st degree <50   Cancer Cousin        maternal female cousins; x3; unknown type   Breast cancer Cousin        maternal female cousin   Esophageal cancer Neg Hx    Stomach cancer Neg Hx    Anesthesia problems Neg Hx    Endometrial cancer Neg Hx    Pancreatic cancer Neg Hx    Prostate cancer Neg Hx     Social History   Socioeconomic History   Marital status: Widowed    Spouse name: Not on file   Number of children: 1   Years of education: Not on file   Highest education level: Not on file  Occupational History   Occupation: retired  Tobacco Use   Smoking status: Never    Smokeless tobacco: Never  Vaping Use   Vaping Use: Never used  Substance and Sexual Activity   Alcohol use: No   Drug use: No   Sexual activity: Not Currently    Birth control/protection: None  Other Topics Concern   Not on file  Social History Narrative   One Boy    Daily Caffeine - tea         Social Determinants of Health   Financial Resource Strain: Not on file  Food Insecurity: Not on file  Transportation Needs: Not on file  Physical Activity: Not on file  Stress: Not on file  Social Connections: Not on file    Current Medications:  Current Outpatient Medications:    acetaminophen (TYLENOL) 325 MG tablet, Take 352 mg by mouth every 6 (six) hours as needed for mild pain or moderate pain., Disp: , Rfl:    ALPRAZolam (XANAX) 1 MG tablet, TAKE 0.5 TO 1 TABLET BY MOUTH AT BEDTIME AS NEEDED FOR ANXIETY OR SLEEP, Disp: 90 tablet, Rfl: 1   amLODipine (NORVASC) 5 MG tablet, TAKE 1 TABLET (5 MG TOTAL) BY MOUTH DAILY., Disp: 90 tablet, Rfl: 3   captopril (CAPOTEN) 50 MG tablet, TAKE 1 TABLET BY MOUTH TWICE A DAY, Disp: 180 tablet, Rfl: 3   Cholecalciferol 1000 UNITS tablet, Take 1,000 Units by mouth daily. Vitamin d, Disp: , Rfl:    Coenzyme Q10 (CO Q-10) 100 MG CAPS, Take by mouth daily., Disp: , Rfl:  ezetimibe (ZETIA) 10 MG tablet, Take 1 tablet (10 mg total) by mouth daily., Disp: 90 tablet, Rfl: 3   mirabegron ER (MYRBETRIQ) 50 MG TB24 tablet, Take 1 tablet (50 mg total) by mouth daily., Disp: 90 tablet, Rfl: 3   omeprazole (PRILOSEC) 20 MG capsule, Take 1 capsule (20 mg total) by mouth daily., Disp: 180 capsule, Rfl: 1   Pancrelipase, Lip-Prot-Amyl, (CREON) 24000-76000 units CPEP, TAKE ONE CAPSULE BY MOUTH 3 TIMES A DAY WITH FOOD (Patient taking differently: as needed. TAKE ONE CAPSULE BY MOUTH 3 TIMES A DAY WITH FOOD), Disp: 270 capsule, Rfl: 3   polyethylene glycol powder (GAVILAX) 17 GM/SCOOP powder, MIX 1 CAPFUL (17 GRAMS) AS DIRECTED AND DRINK TWICE A DAY AS NEEDED FOR  MODERATE CONSTIPATION, Disp: 510 g, Rfl: 5   propranolol ER (INDERAL LA) 120 MG 24 hr capsule, TAKE 1 CAPSULE BY MOUTH EVERY DAY, Disp: 90 capsule, Rfl: 2   RESTASIS 0.05 % ophthalmic emulsion, Place 1 drop into both eyes 2 (two) times daily. , Disp: , Rfl:    Vitamin A 2400 MCG (8000 UT) CAPS, Take 2,400 mcg by mouth daily., Disp: , Rfl:   Review of Systems: + Urinary frequency, urinary incontinence, back pain, fatigue, leg swelling, intermittent constipation, dizziness, gait problems, easy bruising/bleeding. Denies appetite changes, fevers, chills, unexplained weight changes. Denies hearing loss, neck lumps or masses, mouth sores, ringing in ears or voice changes. Denies cough or wheezing.  Denies shortness of breath. Denies chest pain or palpitations.  Denies abdominal distention, pain, blood in stools, diarrhea, nausea, vomiting, or early satiety. Denies pain with intercourse, dysuria, hematuria. Denies hot flashes, pelvic pain, vaginal bleeding or vaginal discharge.   Denies joint pain or muscle pain/cramps. Denies itching, rash, or wounds. Denies headaches, numbness or seizures. Denies swollen lymph nodes or glands. Denies anxiety, depression, confusion, or decreased concentration.  Physical Exam: BP (!) 157/81 (BP Location: Left Arm, Patient Position: Sitting)   Pulse 63   Temp 98 F (36.7 C) (Oral)   Resp 18   Ht 4' 11.84" (1.52 m)   Wt 134 lb (60.8 kg)   SpO2 97%   BMI 26.31 kg/m  General: Alert, oriented, no acute distress. HEENT: Normocephalic, atraumatic, sclera anicteric. Chest: Clear to auscultation bilaterally.  No wheezes or rhonchi. Cardiovascular: Regular rate and rhythm, no murmurs. Abdomen: soft, nontender.  Normoactive bowel sounds.  No masses or hepatosplenomegaly appreciated.  Well-healed incisions. Extremities: Grossly normal range of motion.  Warm, well perfused.  No edema bilaterally. Skin: No rashes or lesions noted. Lymphatics: No cervical,  supraclavicular, or inguinal adenopathy. GU: Normal appearing external genitalia without erythema, excoriation, or lesions.  Speculum exam reveals mildly atrophic vaginal mucosa as well as some apical prolapse.  No masses or lesions noted.  Bimanual exam reveals no masses or nodularity.  Rectovaginal exam confirms findings.  Laboratory & Radiologic Studies: 03/21/22: CT A/P IMPRESSION: No evidence of recurrent or metastatic carcinoma within the abdomen or pelvis. Colonic diverticulosis, without radiographic evidence of diverticulitis. Stable small benign left adrenal adenoma. Aortic Atherosclerosis (ICD10-I70.0).  Assessment & Plan: Sophia Santos is a 88 y.o. 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  Completed adjuvant chemotherapy and VBT in 01/2022. Post-treatment imaging in 03/2022 negative for evidence of disease.   Patient is doing well and is NED on exam today.  Given persistent urinary symptoms, we discussed urology or urogynecology referral.  Patient has seen urology in the past but not here  within our system.  She is interested in a new referral.  Referral placed to urogynecology for her overactive bladder and urinary incontinence.  Per NCCN surveillance recommendations, we discussed continued surveillance visits every 3 months.  Patient is scheduled to see Dr. Sondra Come in late January.  I have asked her to call my office after the new year to schedule a visit to see me in April.  We reviewed signs and symptoms that would be concerning for cancer recurrence and I stressed the importance of calling if she develops any of these.  26 minutes of total time was spent for this patient encounter, including preparation, face-to-face counseling with the patient and coordination of care, and documentation of the encounter.  Jeral Pinch, MD  Division of Gynecologic Oncology  Department of Obstetrics and Gynecology   [facility]

## 2022-09-19 NOTE — Patient Instructions (Signed)
It was good to see you today.  I do not see or feel any evidence of cancer recurrence on your exam.  I will see you for follow-up in 6 months.  Please call my office after the new year to schedule a visit to see me in April.  As always, if you develop any new and concerning symptoms before your next visit, please call to see me sooner.

## 2022-09-24 ENCOUNTER — Ambulatory Visit (INDEPENDENT_AMBULATORY_CARE_PROVIDER_SITE_OTHER): Payer: Medicare Other

## 2022-09-24 VITALS — Ht 60.0 in | Wt 135.0 lb

## 2022-09-24 DIAGNOSIS — Z0000 Encounter for general adult medical examination without abnormal findings: Secondary | ICD-10-CM | POA: Diagnosis not present

## 2022-09-24 NOTE — Patient Instructions (Signed)
Sophia Santos , Thank you for taking time to come for your Medicare Wellness Visit. I appreciate your ongoing commitment to your health goals. Please review the following plan we discussed and let me know if I can assist you in the future.   These are the goals we discussed:  Goals      Client understands the importance of follow-up with providers by attending scheduled visits        This is a list of the screening recommended for you and due dates:  Health Maintenance  Topic Date Due   DEXA scan (bone density measurement)  Never done   COVID-19 Vaccine (4 - Mixed Product risk series) 12/18/2020   Tetanus Vaccine  06/25/2030   Pneumonia Vaccine  Completed   Flu Shot  Completed   Zoster (Shingles) Vaccine  Completed   HPV Vaccine  Aged Out    Advanced directives: Yes; Please bring a copy of your health care power of attorney and living will to the office at your convenience.  Conditions/risks identified: Yes  Next appointment: Follow up in one year for your annual wellness visit.   Preventive Care 83 Years and Older, Female Preventive care refers to lifestyle choices and visits with your health care provider that can promote health and wellness. What does preventive care include? A yearly physical exam. This is also called an annual well check. Dental exams once or twice a year. Routine eye exams. Ask your health care provider how often you should have your eyes checked. Personal lifestyle choices, including: Daily care of your teeth and gums. Regular physical activity. Eating a healthy diet. Avoiding tobacco and drug use. Limiting alcohol use. Practicing safe sex. Taking low-dose aspirin every day. Taking vitamin and mineral supplements as recommended by your health care provider. What happens during an annual well check? The services and screenings done by your health care provider during your annual well check will depend on your age, overall health, lifestyle risk factors,  and family history of disease. Counseling  Your health care provider may ask you questions about your: Alcohol use. Tobacco use. Drug use. Emotional well-being. Home and relationship well-being. Sexual activity. Eating habits. History of falls. Memory and ability to understand (cognition). Work and work Statistician. Reproductive health. Screening  You may have the following tests or measurements: Height, weight, and BMI. Blood pressure. Lipid and cholesterol levels. These may be checked every 5 years, or more frequently if you are over 6 years old. Skin check. Lung cancer screening. You may have this screening every year starting at age 15 if you have a 30-pack-year history of smoking and currently smoke or have quit within the past 15 years. Fecal occult blood test (FOBT) of the stool. You may have this test every year starting at age 74. Flexible sigmoidoscopy or colonoscopy. You may have a sigmoidoscopy every 5 years or a colonoscopy every 10 years starting at age 61. Hepatitis C blood test. Hepatitis B blood test. Sexually transmitted disease (STD) testing. Diabetes screening. This is done by checking your blood sugar (glucose) after you have not eaten for a while (fasting). You may have this done every 1-3 years. Bone density scan. This is done to screen for osteoporosis. You may have this done starting at age 58. Mammogram. This may be done every 1-2 years. Talk to your health care provider about how often you should have regular mammograms. Talk with your health care provider about your test results, treatment options, and if necessary, the need  for more tests. Vaccines  Your health care provider may recommend certain vaccines, such as: Influenza vaccine. This is recommended every year. Tetanus, diphtheria, and acellular pertussis (Tdap, Td) vaccine. You may need a Td booster every 10 years. Zoster vaccine. You may need this after age 38. Pneumococcal 13-valent conjugate  (PCV13) vaccine. One dose is recommended after age 4. Pneumococcal polysaccharide (PPSV23) vaccine. One dose is recommended after age 29. Talk to your health care provider about which screenings and vaccines you need and how often you need them. This information is not intended to replace advice given to you by your health care provider. Make sure you discuss any questions you have with your health care provider. Document Released: 12/28/2015 Document Revised: 08/20/2016 Document Reviewed: 10/02/2015 Elsevier Interactive Patient Education  2017 [place] Prevention in the Home Falls can cause injuries. They can happen to people of all ages. There are many things you can do to make your home safe and to help prevent falls. What can I do on the outside of my home? Regularly fix the edges of walkways and driveways and fix any cracks. Remove anything that might make you trip as you walk through a door, such as a raised step or threshold. Trim any bushes or trees on the path to your home. Use bright outdoor lighting. Clear any walking paths of anything that might make someone trip, such as rocks or tools. Regularly check to see if handrails are loose or broken. Make sure that both sides of any steps have handrails. Any raised decks and porches should have guardrails on the edges. Have any leaves, snow, or ice cleared regularly. Use sand or salt on walking paths during winter. Clean up any spills in your garage right away. This includes oil or grease spills. What can I do in the bathroom? Use night lights. Install grab bars by the toilet and in the tub and shower. Do not use towel bars as grab bars. Use non-skid mats or decals in the tub or shower. If you need to sit down in the shower, use a plastic, non-slip stool. Keep the floor dry. Clean up any water that spills on the floor as soon as it happens. Remove soap buildup in the tub or shower regularly. Attach bath mats securely with  double-sided non-slip rug tape. Do not have throw rugs and other things on the floor that can make you trip. What can I do in the bedroom? Use night lights. Make sure that you have a light by your bed that is easy to reach. Do not use any sheets or blankets that are too big for your bed. They should not hang down onto the floor. Have a firm chair that has side arms. You can use this for support while you get dressed. Do not have throw rugs and other things on the floor that can make you trip. What can I do in the kitchen? Clean up any spills right away. Avoid walking on wet floors. Keep items that you use a lot in easy-to-reach places. If you need to reach something above you, use a strong step stool that has a grab bar. Keep electrical cords out of the way. Do not use floor polish or wax that makes floors slippery. If you must use wax, use non-skid floor wax. Do not have throw rugs and other things on the floor that can make you trip. What can I do with my stairs? Do not leave any items on the stairs.  Make sure that there are handrails on both sides of the stairs and use them. Fix handrails that are broken or loose. Make sure that handrails are as long as the stairways. Check any carpeting to make sure that it is firmly attached to the stairs. Fix any carpet that is loose or worn. Avoid having throw rugs at the top or bottom of the stairs. If you do have throw rugs, attach them to the floor with carpet tape. Make sure that you have a light switch at the top of the stairs and the bottom of the stairs. If you do not have them, ask someone to add them for you. What else can I do to help prevent falls? Wear shoes that: Do not have high heels. Have rubber bottoms. Are comfortable and fit you well. Are closed at the toe. Do not wear sandals. If you use a stepladder: Make sure that it is fully opened. Do not climb a closed stepladder. Make sure that both sides of the stepladder are locked  into place. Ask someone to hold it for you, if possible. Clearly mark and make sure that you can see: Any grab bars or handrails. First and last steps. Where the edge of each step is. Use tools that help you move around (mobility aids) if they are needed. These include: Canes. Walkers. Scooters. Crutches. Turn on the lights when you go into a dark area. Replace any light bulbs as soon as they burn out. Set up your furniture so you have a clear path. Avoid moving your furniture around. If any of your floors are uneven, fix them. If there are any pets around you, be aware of where they are. Review your medicines with your doctor. Some medicines can make you feel dizzy. This can increase your chance of falling. Ask your doctor what other things that you can do to help prevent falls. This information is not intended to replace advice given to you by your health care provider. Make sure you discuss any questions you have with your health care provider. Document Released: 09/27/2009 Document Revised: 05/08/2016 Document Reviewed: 01/05/2015 Elsevier Interactive Patient Education  2017 [employer].

## 2022-09-24 NOTE — Progress Notes (Addendum)
Virtual Visit via Telephone Note  I connected with  Sophia Santos on 09/24/22 at 11:30 AM EDT by telephone and verified that I am speaking with the correct person using two identifiers.  Location: Patient: HOME Provider: [facility] Persons participating in the virtual visit: patient/Nurse Health Advisor   I discussed the limitations, risks, security and privacy concerns of performing an evaluation and management service by telephone and the availability of in person appointments. The patient expressed understanding and agreed to proceed.  Interactive audio and video telecommunications were attempted between this nurse and patient, however failed, due to patient having technical difficulties OR patient did not have access to video capability.  We continued and completed visit with audio only.  Some vital signs may be absent or patient reported.   Sheral Flow, LPN  Subjective:   Sophia Santos is a 88 y.o. female who presents for an Initial Medicare Annual Wellness Visit.  Review of Systems     Cardiac Risk Factors include: advanced age (>52mn, >>42women);hypertension;family history of premature cardiovascular disease     Objective:    Today's Vitals   09/24/22 1158  Weight: 135 lb (61.2 kg)  Height: 5' (1.524 m)  PainSc: 0-No pain   Body mass index is 26.37 kg/m.     09/24/2022   11:39 AM 07/07/2022    3:49 PM 03/06/2022    3:16 PM 11/20/2021   12:37 PM 10/14/2021    2:19 PM 10/02/2021   10:44 AM 08/21/2021    7:33 AM  Advanced Directives  Does Patient Have a Medical Advance Directive? Yes No No No Yes Yes No  Type of [place]of [place] will     [place] will   Does patient want to make changes to medical advance directive?   No - Patient declined No - Patient declined No - Patient declined No - Patient declined   Copy of [place] Chart? No - copy requested     No - copy  requested   Would patient like information on creating a medical advance directive?   No - Patient declined No - Patient declined   No - Patient declined    Current Medications (verified) Outpatient Encounter Medications as of 09/24/2022  Medication Sig   acetaminophen (TYLENOL) 325 MG tablet Take 352 mg by mouth every 6 (six) hours as needed for mild pain or moderate pain.   ALPRAZolam (XANAX) 1 MG tablet TAKE 0.5 TO 1 TABLET BY MOUTH AT BEDTIME AS NEEDED FOR ANXIETY OR SLEEP   amLODipine (NORVASC) 5 MG tablet TAKE 1 TABLET (5 MG TOTAL) BY MOUTH DAILY.   captopril (CAPOTEN) 50 MG tablet TAKE 1 TABLET BY MOUTH TWICE A DAY   Cholecalciferol 1000 UNITS tablet Take 1,000 Units by mouth daily. Vitamin d   Coenzyme Q10 (CO Q-10) 100 MG CAPS Take by mouth daily.   ezetimibe (ZETIA) 10 MG tablet Take 1 tablet (10 mg total) by mouth daily.   mirabegron ER (MYRBETRIQ) 50 MG TB24 tablet Take 1 tablet (50 mg total) by mouth daily.   omeprazole (PRILOSEC) 20 MG capsule Take 1 capsule (20 mg total) by mouth daily.   Pancrelipase, Lip-Prot-Amyl, (CREON) 24000-76000 units CPEP TAKE ONE CAPSULE BY MOUTH 3 TIMES A DAY WITH FOOD (Patient taking differently: as needed. TAKE ONE CAPSULE BY MOUTH 3 TIMES A DAY WITH FOOD)   polyethylene glycol powder (GAVILAX) 17 GM/SCOOP powder MIX 1 CAPFUL (17 GRAMS) AS DIRECTED AND  DRINK TWICE A DAY AS NEEDED FOR MODERATE CONSTIPATION   propranolol ER (INDERAL LA) 120 MG 24 hr capsule TAKE 1 CAPSULE BY MOUTH EVERY DAY   RESTASIS 0.05 % ophthalmic emulsion Place 1 drop into both eyes 2 (two) times daily.    Vitamin A 2400 MCG (8000 UT) CAPS Take 2,400 mcg by mouth daily.   No facility-administered encounter medications on file as of 09/24/2022.    Allergies (verified) Fosamax [alendronate sodium], Lipitor [atorvastatin], Voltaren [diclofenac sodium], and Zocor [simvastatin]   History: Past Medical History:  Diagnosis Date   Arthritis    Complication of anesthesia     "years ago, hard to awaken, no with recent procedures."   Diverticulosis    Endometrial cancer ([place])    Family history of colon cancer 10/15/2021   Gallstones    GERD (gastroesophageal reflux disease)    Headache 08/15/2021   none recent   History of radiation therapy    Endometrium- vcc brachytherapy 11/20/21-12/18/21- Dr. Gery Pray   HTN (hypertension)    Hyperlipidemia    Irritable bowel syndrome (IBS)    Osteoarthritis    Osteopenia    Osteoporosis    PMB (postmenopausal bleeding) 08/15/2021   Skin cancer    skin cancer -    Urinary frequency 08/15/2021   Wears glasses 08/15/2021   Past Surgical History:  Procedure Laterality Date   bilateral cataracts     COLONOSCOPY     INGUINAL HERNIA REPAIR Bilateral 09/13/2020   Procedure: LAPAROSCOPIC BILATERAL INGUINAL HERNIA REPAIR WITH MESH;  Surgeon: Ralene Ok, MD;  Location: [place];  Service: General;  Laterality: Bilateral;   ROBOTIC ASSISTED TOTAL HYSTERECTOMY WITH BILATERAL SALPINGO OOPHERECTOMY Bilateral 08/21/2021   Procedure: XI ROBOTIC ASSISTED TOTAL HYSTERECTOMY WITH BILATERAL SALPINGO OOPHORECTOMY;  Surgeon: Everitt Amber, MD;  Location: [place];  Service: Gynecology;  Laterality: Bilateral;   SENTINEL NODE BIOPSY N/A 08/21/2021   Procedure: SENTINEL NODE BIOPSY;  Surgeon: Everitt Amber, MD;  Location[facility];  Service: Gynecology;  Laterality: N/A;   uterine prolapse surgery     Family History  Problem Relation Age of Onset   Colon cancer Mother 21   Stroke Mother    Parkinson's disease Father    Dementia Father    Colon cancer Sister 47   Colon polyps Sister    Cancer Maternal Aunt        unknown type   Cancer Paternal Aunt        unknown type; ?intestinal   Coronary artery disease Other        female 1st degree <50   Cancer Cousin        maternal female cousins; x3; unknown type   Breast cancer Cousin        maternal female cousin   Esophageal cancer Neg Hx    Stomach  cancer Neg Hx    Anesthesia problems Neg Hx    Endometrial cancer Neg Hx    Pancreatic cancer Neg Hx    Prostate cancer Neg Hx    Social History   Socioeconomic History   Marital status: Widowed    Spouse name: Not on file   Number of children: 1   Years of education: Not on file   Highest education level: Not on file  Occupational History   Occupation: retired  Tobacco Use   Smoking status: Never   Smokeless tobacco: Never  Vaping Use   Vaping Use: Never used  Substance and Sexual Activity  Alcohol use: No   Drug use: No   Sexual activity: Not Currently    Birth control/protection: None  Other Topics Concern   Not on file  Social History Narrative   One Boy    Daily Caffeine - tea         Social Determinants of Health   Financial Resource Strain: Low Risk  (09/24/2022)   Overall Financial Resource Strain (CARDIA)    Difficulty of Paying Living Expenses: Not hard at all  Food Insecurity: No Food Insecurity (09/24/2022)   Hunger Vital Sign    Worried About Running Out of Food in the Last Year: Never true    Ran Out of Food in the Last Year: Never true  Transportation Needs: No Transportation Needs (09/24/2022)   PRAPARE - Hydrologist (Medical): No    Lack of Transportation (Non-Medical): No  Physical Activity: Inactive (09/24/2022)   Exercise Vital Sign    Days of Exercise per Week: 0 days    Minutes of Exercise per Session: 0 min  Stress: No Stress Concern Present (09/24/2022)   [place]    Feeling of Stress : Not at all  Social Connections: Moderately Integrated (09/24/2022)   Social Connection and Isolation Panel [NHANES]    Frequency of Communication with Friends and Family: More than three times a week    Frequency of Social Gatherings with Friends and Family: More than three times a week    Attends Religious Services: More than 4 times per year    Active  Member of [employer] or Organizations: Yes    Attends Archivist Meetings: More than 4 times per year    Marital Status: Widowed    Tobacco Counseling Counseling given: Not Answered   Clinical Intake:  Pre-visit preparation completed: Yes  Pain : No/denies pain Pain Score: 0-No pain     BMI - recorded: 26.37 Nutritional Status: BMI 25 -29 Overweight Nutritional Risks: None Diabetes: No  How often do you need to have someone help you when you read instructions, pamphlets, or other written materials from your doctor or pharmacy?: 1 - Never What is the last grade level you completed in school?: [employer]' Bachelor's Degree in Engineering  Diabetic? NO  Interpreter Needed?: No  Information entered by :: Lisette Abu, LPN.   Activities of Daily Living    09/24/2022   12:08 PM  In your present state of health, do you have any difficulty performing the following activities:  Hearing? 0  Vision? 0  Difficulty concentrating or making decisions? 0  Walking or climbing stairs? 1  Dressing or bathing? 1  Doing errands, shopping? 1  Preparing Food and eating ? Y  Using the Toilet? Y  In the past six months, have you accidently leaked urine? N  Do you have problems with loss of bowel control? N  Managing your Medications? Y  Managing your Finances? Y  Housekeeping or managing your Housekeeping? Y    Patient Care Team: Plotnikov, Evie Lacks, MD as PCP - Yehuda Savannah, MD (Obstetrics and Gynecology) Ralene Ok, MD as Consulting Physician (General Surgery) Milus Banister, MD as Attending Physician (Gastroenterology) Marilynne Halsted, MD as Referring Physician (Ophthalmology)  Indicate any recent Medical Services you may have received from other than [facility] providers in the past year (date may be approximate).     Assessment:   This is a routine wellness examination for Chloris.  Hearing/Vision screen Hearing  Screening - Comments:: Denies hearing  difficulties   Vision Screening - Comments:: Wears rx glasses - up to date with routine eye exams with Gretta Cool, MD.   Dietary issues and exercise activities discussed: Current Exercise Habits: The patient does not participate in regular exercise at present, Exercise limited by: orthopedic condition(s)   Goals Addressed             This Visit's Progress    Client understands the importance of follow-up with providers by attending scheduled visits        Depression Screen    09/24/2022   12:01 PM 09/10/2022    1:43 PM 07/15/2021    2:36 PM 05/06/2021   10:27 AM 04/04/2020    2:45 PM 11/15/2018   11:56 AM 03/02/2018    1:50 PM  PHQ 2/9 Scores  PHQ - 2 Score 0 0 1 0 0 1 0  PHQ- 9 Score '3 3 11 '$ 0  4     Fall Risk    09/10/2022    1:43 PM 07/15/2021    2:09 PM 05/06/2021   10:28 AM 04/04/2020    2:45 PM 03/02/2018    1:50 PM  Fall Risk   Falls in the past year? 1 0 1 0 No  Number falls in past yr: 0  0    Injury with Fall? 1  1    Risk for fall due to : History of fall(s);Impaired balance/gait;Impaired mobility  History of fall(s)    Follow up    Falls evaluation completed     FALL RISK PREVENTION PERTAINING TO THE HOME:  Any stairs in or around the home? Yes  If so, are there any without handrails? No  Home free of loose throw rugs in walkways, pet beds, electrical cords, etc? Yes  Adequate lighting in your home to reduce risk of falls? Yes   ASSISTIVE DEVICES UTILIZED TO PREVENT FALLS:  Life alert? No  Use of a cane, walker or w/c? Yes  Grab bars in the bathroom? Yes  Shower chair or bench in shower? Yes  Elevated toilet seat or a handicapped toilet? Yes   TIMED UP AND GO:  Was the test performed? No . PHONE VISIT   Cognitive Function:        09/24/2022   12:09 PM  6CIT Screen  What Year? 0 points  What month? 0 points  What time? 0 points  Count back from 20 0 points  Months in reverse 0 points  Repeat phrase 0 points  Total Score 0 points     Immunizations Immunization History  Administered Date(s) Administered   Fluad Quad(high Dose 65+) 10/04/2020, 09/10/2022   Influenza Split 08/27/2011   Influenza Whole 09/12/2008, 09/25/2009, 09/19/2010   Influenza, High Dose Seasonal PF 09/03/2016, 12/02/2017, 10/06/2018, 08/04/2019   Influenza, Seasonal, Injecte, Preservative Fre 11/09/2012   Influenza,inj,Quad PF,6+ Mos 09/07/2013, 08/29/2014   Influenza-Unspecified 08/16/2015   Moderna Sars-Covid-2 Vaccination 10/23/2020   PFIZER(Purple Top)SARS-COV-2 Vaccination 01/21/2020, 02/15/2020   Pneumococcal Conjugate-13 02/03/2017   Pneumococcal Polysaccharide-23 08/29/2014   Tdap 06/25/2020   Zoster Recombinat (Shingrix) 10/08/2018, 10/05/2019    TDAP status: Up to date  Flu Vaccine status: Up to date  Pneumococcal vaccine status: Up to date  Covid-19 vaccine status: Completed vaccines  Qualifies for Shingles Vaccine? Yes   Zostavax completed No   Shingrix Completed?: Yes  Screening Tests Health Maintenance  Topic Date Due   DEXA SCAN  Never done   COVID-19 Vaccine (4 - Mixed  Product risk series) 12/18/2020   TETANUS/TDAP  06/25/2030   Pneumonia Vaccine 21+ Years old  Completed   INFLUENZA VACCINE  Completed   Zoster Vaccines- Shingrix  Completed   HPV VACCINES  Aged Out    Health Maintenance  Health Maintenance Due  Topic Date Due   DEXA SCAN  Never done   COVID-19 Vaccine (4 - Mixed Product risk series) 12/18/2020    Colorectal cancer screening: No longer required.   Mammogram status: Completed 03/21/2021. Repeat every year  Bone density status: Never done  Lung Cancer Screening: (Low Dose CT Chest recommended if Age 85-80 years, 30 pack-year currently smoking OR have quit w/in 15years.) does not qualify.   Lung Cancer Screening Referral: no  Additional Screening:  Hepatitis C Screening: does not qualify; Completed no  Vision Screening: Recommended annual ophthalmology exams for early detection of  glaucoma and other disorders of the eye. Is the patient up to date with their annual eye exam?  Yes  Who is the provider or what is the name of the office in which the patient attends annual eye exams? Gretta Cool, MD. If pt is not established with a provider, would they like to be referred to a provider to establish care? No .   Dental Screening: Recommended annual dental exams for proper oral hygiene  Community Resource Referral / Chronic Care Management: CRR required this visit?  No   CCM required this visit?  No      Plan:     I have personally reviewed and noted the following in the patient's chart:   Medical and social history Use of alcohol, tobacco or illicit drugs  Current medications and supplements including opioid prescriptions. Patient is not currently taking opioid prescriptions. Functional ability and status Nutritional status Physical activity Advanced directives List of other physicians Hospitalizations, surgeries, and ER visits in previous 12 months Vitals Screenings to include cognitive, depression, and falls Referrals and appointments  In addition, I have reviewed and discussed with patient certain preventive protocols, quality metrics, and best practice recommendations. A written personalized care plan for preventive services as well as general preventive health recommendations were provided to patient.     Sheral Flow, LPN   [id number]   Nurse Notes: N/A    Medical screening examination/treatment/procedure(s) were performed by non-physician practitioner and as supervising physician I was immediately available for consultation/collaboration.  I agree with above. Lew Dawes, MD

## 2022-12-03 ENCOUNTER — Telehealth: Payer: Self-pay | Admitting: Internal Medicine

## 2022-12-03 ENCOUNTER — Encounter: Payer: Self-pay | Admitting: Internal Medicine

## 2022-12-03 ENCOUNTER — Ambulatory Visit (INDEPENDENT_AMBULATORY_CARE_PROVIDER_SITE_OTHER): Payer: Medicare Other | Admitting: Internal Medicine

## 2022-12-03 VITALS — BP 130/78 | HR 70 | Temp 97.8°F | Ht 60.0 in | Wt 135.0 lb

## 2022-12-03 DIAGNOSIS — C541 Malignant neoplasm of endometrium: Secondary | ICD-10-CM

## 2022-12-03 DIAGNOSIS — C4490 Unspecified malignant neoplasm of skin, unspecified: Secondary | ICD-10-CM

## 2022-12-03 DIAGNOSIS — N3000 Acute cystitis without hematuria: Secondary | ICD-10-CM

## 2022-12-03 MED ORDER — PHENAZOPYRIDINE HCL 100 MG PO TABS: 100.0000 mg | ORAL_TABLET | Freq: Three times a day (TID) | ORAL | 1 refills | Status: DC | PRN

## 2022-12-03 MED ORDER — CEFUROXIME AXETIL 250 MG PO TABS: 250.0000 mg | ORAL_TABLET | Freq: Two times a day (BID) | ORAL | 0 refills | Status: AC

## 2022-12-03 NOTE — Telephone Encounter (Signed)
Patient was seen today 12/03/22 by Dr.Plotnikov, along with her daughter. Daughter, Royetta Crochet, states that she and her husband, Lilyan Gilford, would like to start seeing Dr.Plotnikov. Her husband currently has a new patient appointment scheduled with Dr. Mitchel Honour on 02/04/23. Is Dr. Alain Marion okay with taking Sophia Santos on as a new patient, and would also take on her husband.

## 2022-12-03 NOTE — Progress Notes (Signed)
Subjective:  [name], female    DOB: Jun 10, 1934  Age: 88 y.o. MRN: [id number]  CC: Pain (In bladder)   HPI Yael Tillery presents for dysuria, depression, grief C/o UTI x 4 days  Outpatient Medications Prior to Visit  Medication Sig Dispense Refill   acetaminophen (TYLENOL) 325 MG tablet Take 352 mg by mouth every 6 (six) hours as needed for mild pain or moderate pain.     ALPRAZolam (XANAX) 1 MG tablet TAKE 0.5 TO 1 TABLET BY MOUTH AT BEDTIME AS NEEDED FOR ANXIETY OR SLEEP 90 tablet 1   amLODipine (NORVASC) 5 MG tablet TAKE 1 TABLET (5 MG TOTAL) BY MOUTH DAILY. 90 tablet 3   captopril (CAPOTEN) 50 MG tablet TAKE 1 TABLET BY MOUTH TWICE A DAY 180 tablet 3   Cholecalciferol 1000 UNITS tablet Take 1,000 Units by mouth daily. Vitamin d     Coenzyme Q10 (CO Q-10) 100 MG CAPS Take by mouth daily.     ezetimibe (ZETIA) 10 MG tablet Take 1 tablet (10 mg total) by mouth daily. 90 tablet 3   mirabegron ER (MYRBETRIQ) 50 MG TB24 tablet Take 1 tablet (50 mg total) by mouth daily. 90 tablet 3   omeprazole (PRILOSEC) 20 MG capsule Take 1 capsule (20 mg total) by mouth daily. 180 capsule 1   Pancrelipase, Lip-Prot-Amyl, (CREON) 24000-76000 units CPEP TAKE ONE CAPSULE BY MOUTH 3 TIMES A DAY WITH FOOD (Patient taking differently: as needed. TAKE ONE CAPSULE BY MOUTH 3 TIMES A DAY WITH FOOD) 270 capsule 3   polyethylene glycol powder (GAVILAX) 17 GM/SCOOP powder MIX 1 CAPFUL (17 GRAMS) AS DIRECTED AND DRINK TWICE A DAY AS NEEDED FOR MODERATE CONSTIPATION 510 g 5   propranolol ER (INDERAL LA) 120 MG 24 hr capsule TAKE 1 CAPSULE BY MOUTH EVERY DAY 90 capsule 2   RESTASIS 0.05 % ophthalmic emulsion Place 1 drop into both eyes 2 (two) times daily.      Vitamin A 2400 MCG (8000 UT) CAPS Take 2,400 mcg by mouth daily.     No facility-administered medications prior to visit.    ROS: Review of Systems  Constitutional:  Positive for fatigue. Negative for activity change, appetite change, chills  and unexpected weight change.  HENT:  Negative for congestion, mouth sores and sinus pressure.   Eyes:  Negative for visual disturbance.  Respiratory:  Negative for cough and chest tightness.   Gastrointestinal:  Negative for abdominal pain and nausea.  Genitourinary:  Positive for dysuria, frequency and urgency. Negative for difficulty urinating and vaginal pain.  Musculoskeletal:  Negative for back pain and gait problem.  Skin:  Negative for pallor and rash.  Neurological:  Negative for dizziness, tremors, weakness, numbness and headaches.  Psychiatric/Behavioral:  Negative for confusion and sleep disturbance.     Objective:  BP 130/78 (BP Location: Left Arm, Patient Position: Sitting, Cuff Size: Normal)   Pulse 70   Temp 97.8 F (36.6 C) (Oral)   Ht 5' (1.524 m)   Wt 135 lb (61.2 kg)   SpO2 98%   BMI 26.37 kg/m   BP Readings from Last 3 Encounters:  12/03/22 130/78  09/19/22 (!) 157/81  09/10/22 (!) 154/78    Wt Readings from Last 3 Encounters:  12/03/22 135 lb (61.2 kg)  09/24/22 135 lb (61.2 kg)  09/19/22 134 lb (60.8 kg)    Physical Exam Constitutional:      General: She is not in acute distress.    Appearance: She is  well-developed. She is obese.  HENT:     Head: Normocephalic.     Right Ear: External ear normal.     Left Ear: External ear normal.     Nose: Nose normal.  Eyes:     General:        Right eye: No discharge.        Left eye: No discharge.     Conjunctiva/sclera: Conjunctivae normal.     Pupils: Pupils are equal, round, and reactive to light.  Neck:     Thyroid: No thyromegaly.     Vascular: No JVD.     Trachea: No tracheal deviation.  Cardiovascular:     Rate and Rhythm: Normal rate and regular rhythm.     Heart sounds: Normal heart sounds.  Pulmonary:     Effort: No respiratory distress.     Breath sounds: No stridor. No wheezing.  Abdominal:     General: Bowel sounds are normal. There is no distension.     Palpations: Abdomen is  soft. There is no mass.     Tenderness: There is no abdominal tenderness. There is no guarding or rebound.  Musculoskeletal:        General: No tenderness.     Cervical back: Normal range of motion and neck supple. No rigidity.  Lymphadenopathy:     Cervical: No cervical adenopathy.  Skin:    Findings: No erythema or rash.  Neurological:     Mental Status: She is oriented to person, place, and time.     Cranial Nerves: No cranial nerve deficit.     Motor: No abnormal muscle tone.     Coordination: Coordination normal.     Deep Tendon Reflexes: Reflexes normal.  Psychiatric:        Behavior: Behavior normal.        Thought Content: Thought content normal.        Judgment: Judgment normal.     Lab Results  Component Value Date   WBC 5.1 09/10/2022   HGB 12.4 09/10/2022   HCT 36.7 09/10/2022   PLT 211.0 09/10/2022   GLUCOSE 90 09/10/2022   CHOL 238 (H) 05/06/2021   TRIG 106.0 05/06/2021   HDL 65.10 05/06/2021   LDLDIRECT 143.0 03/02/2018   LDLCALC 152 (H) 05/06/2021   ALT 16 09/10/2022   AST 19 09/10/2022   NA 135 09/10/2022   K 4.1 09/10/2022   CL 99 09/10/2022   CREATININE 0.72 09/10/2022   BUN 14 09/10/2022   CO2 29 09/10/2022   TSH 1.39 05/06/2021   INR 1.0 02/23/2020   HGBA1C 5.9 09/29/2007    No results found.  Assessment & Plan:   Problem List Items Addressed This Visit     Urinary tract infection - Primary    Will give Ceftin po UA and Cx      Relevant Medications   cefUROXime (CEFTIN) 250 MG tablet   phenazopyridine (PYRIDIUM) 100 MG tablet   Other Relevant Orders   Urinalysis   CULTURE, URINE COMPREHENSIVE   Skin cancer    She is considering radiation      Relevant Medications   cefUROXime (CEFTIN) 250 MG tablet   Endometrial adenocarcinoma (HCC)    Continue to monitor for abdominal symptoms      Relevant Medications   cefUROXime (CEFTIN) 250 MG tablet      Meds ordered this encounter  Medications   cefUROXime (CEFTIN) 250 MG  tablet    Sig: Take 1 tablet (250 mg total) by  mouth 2 (two) times daily with a meal for 10 days.    Dispense:  14 tablet    Refill:  0   phenazopyridine (PYRIDIUM) 100 MG tablet    Sig: Take 1 tablet (100 mg total) by mouth 3 (three) times daily as needed for pain.    Dispense:  15 tablet    Refill:  1      Follow-up: Return for a follow-up visit.  Walker Kehr, MD

## 2022-12-03 NOTE — Assessment & Plan Note (Signed)
Will give Ceftin po UA and Cx

## 2022-12-09 NOTE — Assessment & Plan Note (Signed)
Continue to monitor for abdominal symptoms

## 2022-12-09 NOTE — Assessment & Plan Note (Signed)
She is considering radiation

## 2022-12-10 NOTE — Telephone Encounter (Signed)
OK to sch new pt  w/me:  Sophia Santos, [name],  Thx

## 2022-12-17 ENCOUNTER — Ambulatory Visit: Payer: Medicare Other | Admitting: Internal Medicine

## 2023-01-05 NOTE — Progress Notes (Signed)
Sophia Santos is here today for follow up post radiation to the pelvic.  They completed their radiation on:  Stage IIIA grade 2 endometrial cancer; endometrial adenocarcinoma   11-20-21 to 12-18-21   Does the patient complain of any of the following:  Pain:Denies Abdominal bloating: Sometimes Diarrhea/Constipation: Constipation sometime,states she take miralax Nausea/Vomiting: no Vaginal Discharge: no Blood in Urine or Stool: no sometime she has urgency Urinary Issues (dysuria/incomplete emptying/ incontinence/ increased frequency/urgency): sometime she has urgency Does patient report using vaginal dilator 2-3 times a week and/or sexually active 2-3 weeks: states that she uses the dilator once in two weeks. Post radiation skin changes: no   Additional comments if applicable:  Vitals:   03/40/35 1555  BP: (!) 163/77  Pulse: 70  Resp: 18  Temp: 97.7 F (36.5 C)  SpO2: 96%  Weight: 63.9 kg  Height: 5' (1.524 m)

## 2023-01-09 NOTE — Progress Notes (Signed)
Radiation Oncology         [phone] ________________________________  Name: Sophia Santos MRN: [id number]  Date: 01/12/2023  DOB: 30-Jan-1934  Follow-Up Visit Note  CC: Plotnikov, Evie Lacks, MD  Plotnikov, Evie Lacks, MD    ICD-10-CM   1. Endometrial adenocarcinoma ([place])  C54.1       Diagnosis: Stage IIIA grade 2 endometrial cancer; endometrial adenocarcinoma    Patient is [place] and speaks [place].  An interpreter is present to help with communication issues.   Interval Since Last Radiation: 1 year and 25 days   Intent: Curative  Radiation Treatment Dates: 11/20/2021 through 12/18/2021 Site Technique Total Dose (Gy) Dose per Fx (Gy) Completed Fx Beam Energies  Vagina: Pelvis HDR-brachy 30/30 6 5/5 Ir-192   Narrative:  The patient returns today for routine follow-up, she was last seen here for follow-up on 07/07/22. Since her last visit, the patient followed up with Dr. Berline Lopes on 09/19/22. During which time, the patient endorsed some ongoing weakness, dizziness, intermittent constipation, an overactive bladder, and urinary incontinence. She otherwise denied any symptoms concerning for disease recurrence and she was noted to be NED one examination.   She reports having medication for bladder infection which helped with her urinary symptoms.  I asked if she undergone consultation with urogynecology and she reports not having this evaluation.  At this point she feels everything is somewhat better and does not wish referral.  She denies any pelvic pain or significant problems with abdominal bloating.  She denies any vaginal bleeding hematuria or rectal bleeding.                            Allergies:  is allergic to fosamax [alendronate sodium], lipitor [atorvastatin], voltaren [diclofenac sodium], and zocor [simvastatin].  Meds: Current Outpatient Medications  Medication Sig Dispense Refill   acetaminophen (TYLENOL) 325 MG tablet Take 352 mg by mouth every 6 (six) hours as needed  for mild pain or moderate pain.     ALPRAZolam (XANAX) 1 MG tablet TAKE 0.5 TO 1 TABLET BY MOUTH AT BEDTIME AS NEEDED FOR ANXIETY OR SLEEP 90 tablet 1   amLODipine (NORVASC) 5 MG tablet TAKE 1 TABLET (5 MG TOTAL) BY MOUTH DAILY. 90 tablet 3   captopril (CAPOTEN) 50 MG tablet TAKE 1 TABLET BY MOUTH TWICE A DAY 180 tablet 3   Cholecalciferol 1000 UNITS tablet Take 1,000 Units by mouth daily. Vitamin d     Coenzyme Q10 (CO Q-10) 100 MG CAPS Take by mouth daily.     ezetimibe (ZETIA) 10 MG tablet Take 1 tablet (10 mg total) by mouth daily. 90 tablet 3   mirabegron ER (MYRBETRIQ) 50 MG TB24 tablet Take 1 tablet (50 mg total) by mouth daily. 90 tablet 3   omeprazole (PRILOSEC) 20 MG capsule Take 1 capsule (20 mg total) by mouth daily. 180 capsule 1   Pancrelipase, Lip-Prot-Amyl, (CREON) 24000-76000 units CPEP TAKE ONE CAPSULE BY MOUTH 3 TIMES A DAY WITH FOOD (Patient taking differently: as needed. TAKE ONE CAPSULE BY MOUTH 3 TIMES A DAY WITH FOOD) 270 capsule 3   polyethylene glycol powder (GAVILAX) 17 GM/SCOOP powder MIX 1 CAPFUL (17 GRAMS) AS DIRECTED AND DRINK TWICE A DAY AS NEEDED FOR MODERATE CONSTIPATION 510 g 5   propranolol ER (INDERAL LA) 120 MG 24 hr capsule TAKE 1 CAPSULE BY MOUTH EVERY DAY 90 capsule 2   RESTASIS 0.05 % ophthalmic emulsion Place 1 drop into both eyes 2 (  two) times daily.      Vitamin A 2400 MCG (8000 UT) CAPS Take 2,400 mcg by mouth daily.     phenazopyridine (PYRIDIUM) 100 MG tablet Take 1 tablet (100 mg total) by mouth 3 (three) times daily as needed for pain. (Patient not taking: Reported on 01/12/2023) 15 tablet 1   No current facility-administered medications for this encounter.    Physical Findings: The patient is in no acute distress. Patient is alert and oriented.  height is 5' (1.524 m) and weight is 140 lb 12.8 oz (63.9 kg). Her temperature is 97.7 F (36.5 C). Her blood pressure is 163/77 (abnormal) and her pulse is 70. Her respiration is 18 and oxygen  saturation is 96%. .   Lungs are clear to auscultation bilaterally. Heart has regular rate and rhythm. No palpable cervical, supraclavicular, or axillary adenopathy. Abdomen soft, non-tender, normal bowel sounds.  On pelvic examination the external genitalia were unremarkable. A speculum exam was performed. There are no mucosal lesions noted in the vaginal vault.  Mild radiation changes noted to the vaginal cuff. On Bimanual and rectovaginal examination no pelvic masses appreciated.  Vaginal cuff intact.  Rectal sphincter tone somewhat decreased.  Lab Findings: Lab Results  Component Value Date   WBC 5.1 09/10/2022   HGB 12.4 09/10/2022   HCT 36.7 09/10/2022   MCV 94.6 09/10/2022   PLT 211.0 09/10/2022    Radiographic Findings: No results found.  Impression: Stage IIIA grade 2 endometrial cancer; endometrial adenocarcinoma    No evidence of recurrence on clinical exam today.  Plan: She will follow-up with Dr. Berline Lopes in 3 months.  Routine follow-up in radiation oncology in 6 months.   25 minutes of total time was spent for this patient encounter, including preparation, face-to-face counseling with the patient and coordination of care, physical exam, and documentation of the encounter. ____________________________________  Blair Promise, PhD, MD  This document serves as a record of services personally performed by Gery Pray, MD. It was created on his behalf by Roney Mans, a trained medical scribe. The creation of this record is based on the scribe's personal observations and the provider's statements to them. This document has been checked and approved by the attending provider.

## 2023-01-12 ENCOUNTER — Other Ambulatory Visit: Payer: Self-pay

## 2023-01-12 ENCOUNTER — Ambulatory Visit
Admission: RE | Admit: 2023-01-12 | Discharge: 2023-01-12 | Disposition: A | Discharge status: Home / Self Care | Payer: 59 | Source: Ambulatory Visit | Attending: Radiation Oncology | Admitting: Radiation Oncology

## 2023-01-12 ENCOUNTER — Encounter: Payer: Self-pay | Admitting: Radiation Oncology

## 2023-01-12 VITALS — BP 163/77 | HR 70 | Temp 97.7°F | Resp 18 | Ht 60.0 in | Wt 140.8 lb

## 2023-01-12 DIAGNOSIS — C541 Malignant neoplasm of endometrium: Secondary | ICD-10-CM

## 2023-01-12 DIAGNOSIS — Z79899 Other long term (current) drug therapy: Secondary | ICD-10-CM | POA: Diagnosis not present

## 2023-01-12 DIAGNOSIS — Z923 Personal history of irradiation: Secondary | ICD-10-CM | POA: Diagnosis not present

## 2023-01-12 DIAGNOSIS — Z8542 Personal history of malignant neoplasm of other parts of uterus: Secondary | ICD-10-CM | POA: Diagnosis present

## 2023-01-12 IMAGING — US US PELVIS COMPLETE WITH TRANSVAGINAL
1 series · 13 of 25 positions shown · non-contrast
Comparison: Ultrasound 10/21/2017, CT 09/30/2016

CLINICAL DATA: Postmenopausal bleeding

EXAM:
TRANSABDOMINAL AND TRANSVAGINAL ULTRASOUND OF PELVIS
TECHNIQUE: Both transabdominal and transvaginal ultrasound examinations of the
pelvis were performed. Transabdominal technique was performed for
global imaging of the pelvis including uterus, ovaries, adnexal
regions, and pelvic cul-de-sac. It was necessary to proceed with
endovaginal exam following the transabdominal exam to visualize the
uterus endometrium ovaries.

[Series 1: us pelvis complete with transvaginal · 13 of 83 slices shown]
[im 1/83]
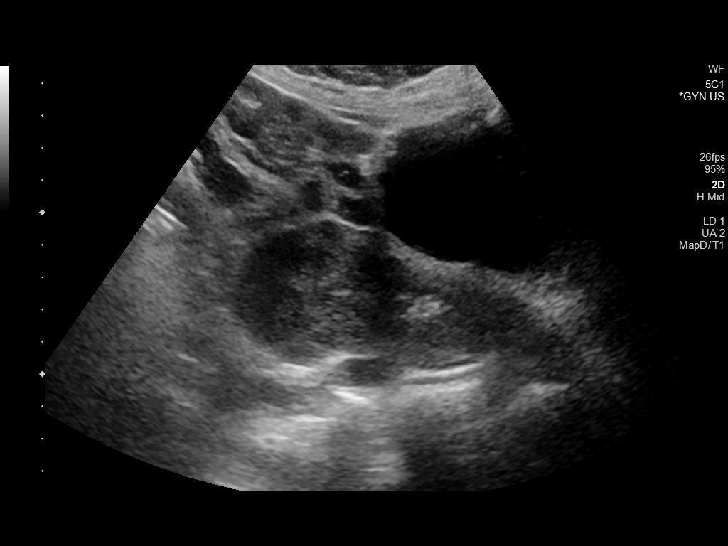
[im 7/83]
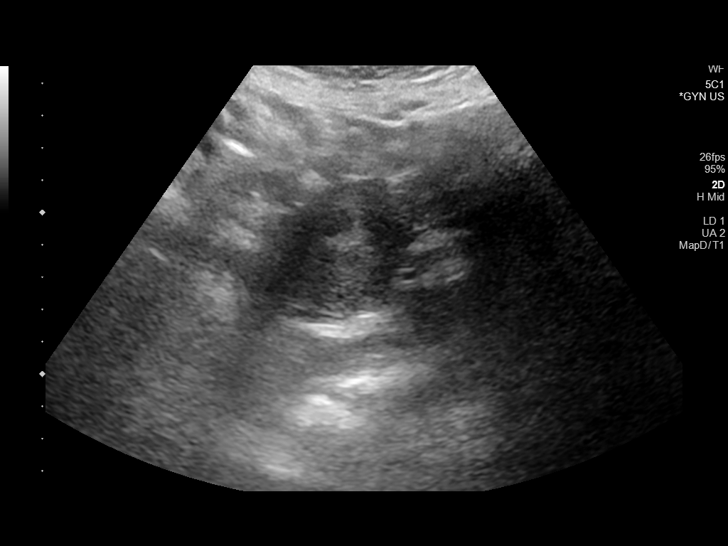
[im 14/83]
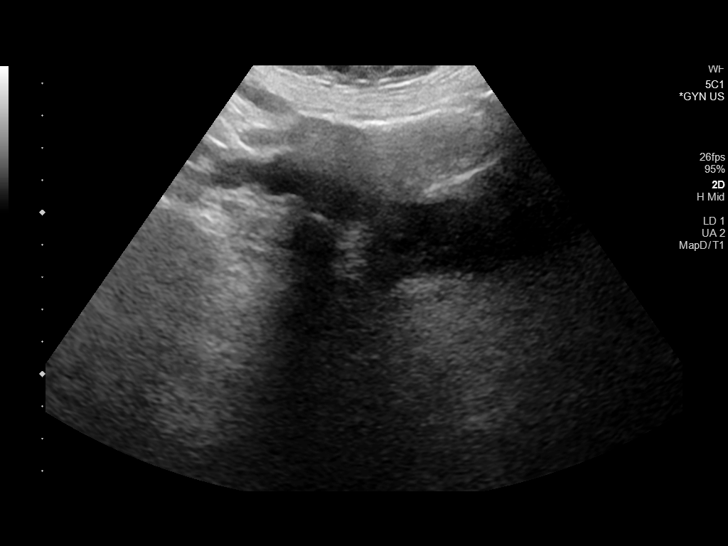
[im 21/83]
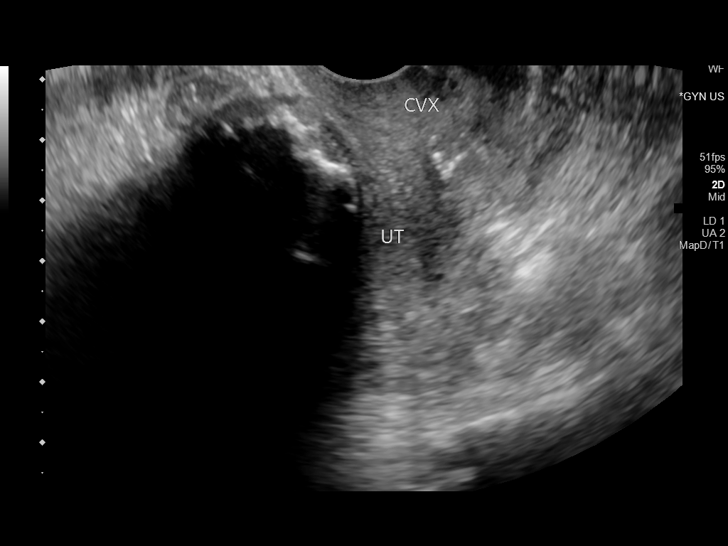
[im 28/83]
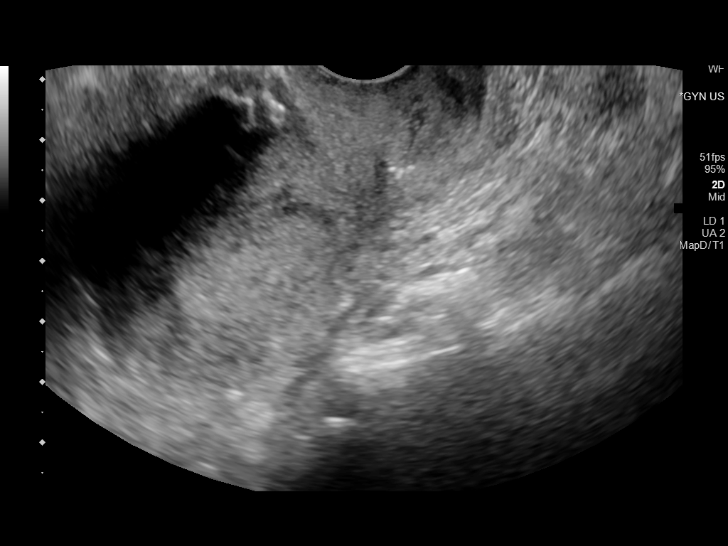
[im 35/83]
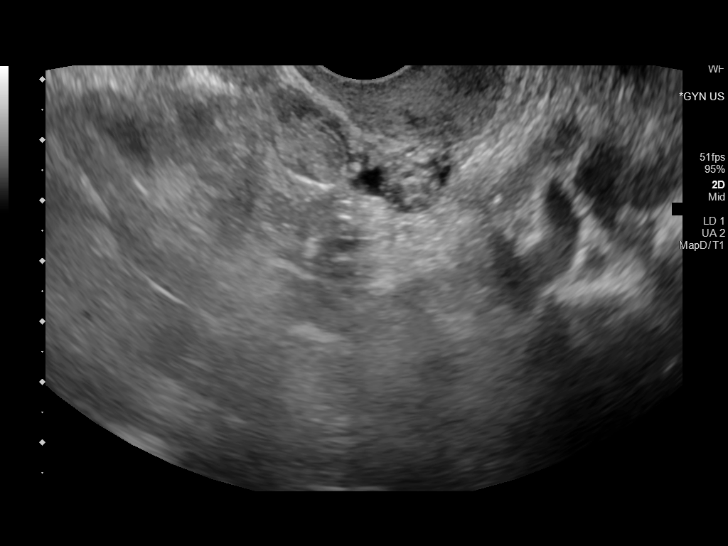
[im 42/83]
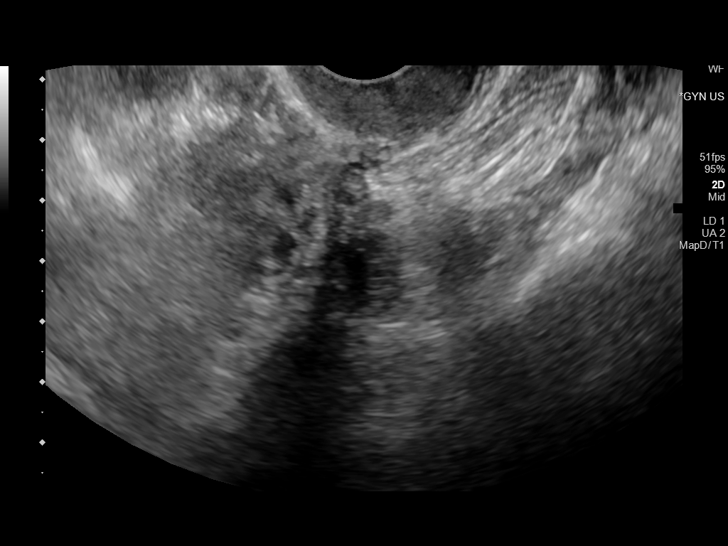
[im 48/83]
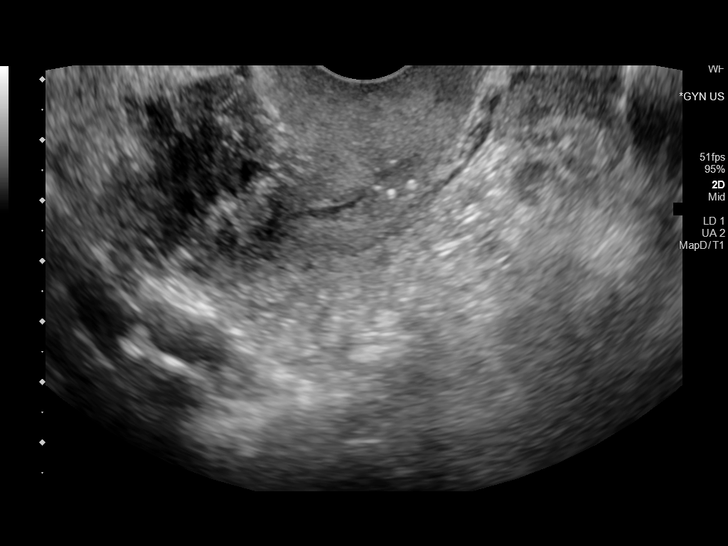
[im 55/83]
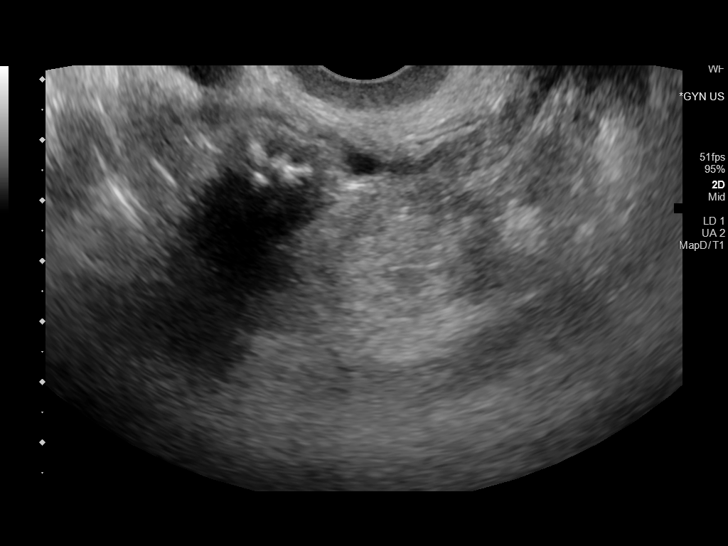
[im 62/83]
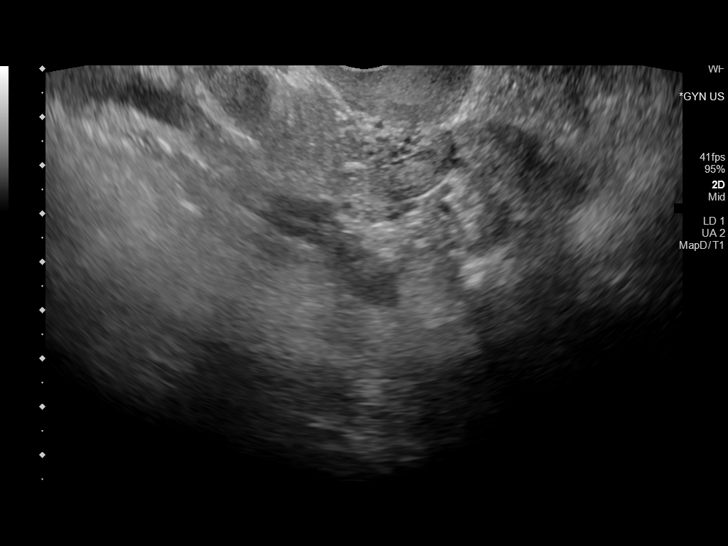
[im 69/83]
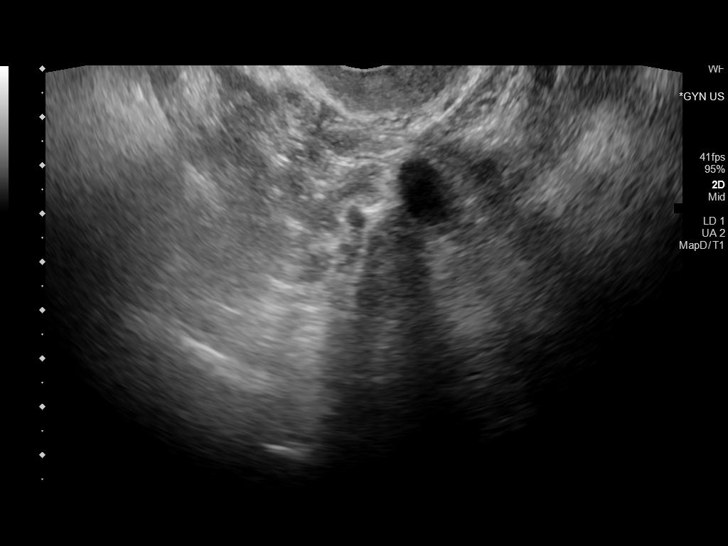
[im 76/83]
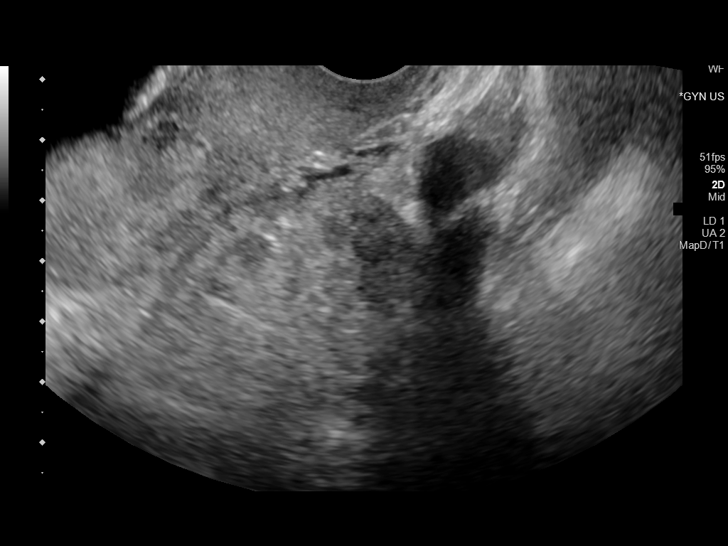
[im 83/83]
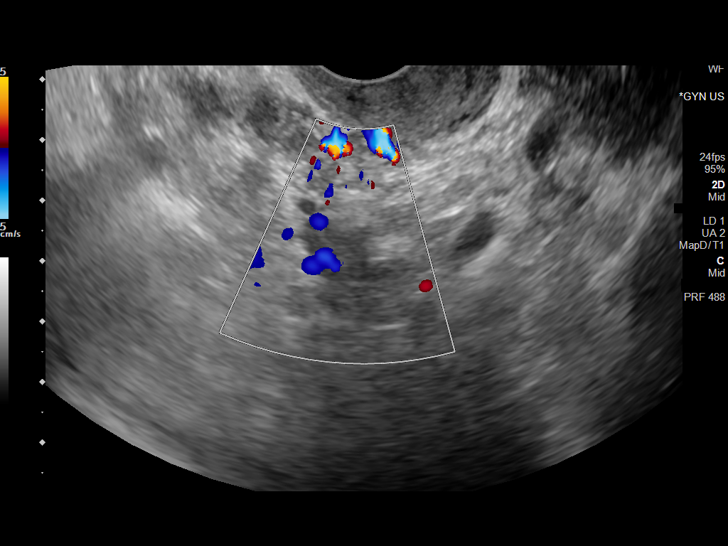

[13 of 25 positions shown; findings below may reference images not displayed]

FINDINGS: Uterus

Measurements: 7.6 x 4.1 x 4.3 cm = volume: 69.7 mL. Densely
calcified shadowing mass within the right uterus measuring 2.2 x
x 2.4 cm. Multiple nonspecific cervical calcifications.

Endometrium

Thickness: 5.6 mm.  No focal abnormality visualized.

Right ovary

Measurements: 1.6 x 0.9 x 1 cm = volume: 0.8 mL. Normal
appearance/no adnexal mass.

Left ovary

Measurements: 1.2 x 0.9 x 0.9 cm = volume: 0.5 mL. Normal
appearance/no adnexal mass.

Other findings

No abnormal free fluid.
IMPRESSION: 1. Endometrial thickness of 5.6 mm. In the setting of
post-menopausal bleeding, endometrial sampling is indicated to
exclude carcinoma. If results are benign, sonohysterogram should be
considered for focal lesion work-up. (Ref: Radiological Reasoning:
Algorithmic Workup of Abnormal Vaginal Bleeding with Endovaginal
Sonography and Sonohysterography. AJR [id number]; 191:S68-73)
2. Densely calcified shadowing mass within the right uterus
presumably representing a calcified fibroid.

## 2023-01-26 ENCOUNTER — Other Ambulatory Visit (INDEPENDENT_AMBULATORY_CARE_PROVIDER_SITE_OTHER): Payer: 59

## 2023-01-26 DIAGNOSIS — N3000 Acute cystitis without hematuria: Secondary | ICD-10-CM

## 2023-01-26 LAB — URINALYSIS, ROUTINE W REFLEX MICROSCOPIC
Bilirubin Urine: NEGATIVE
Ketones, ur: NEGATIVE
Nitrite: NEGATIVE
Specific Gravity, Urine: 1.01 (ref 1.000–1.030)
Total Protein, Urine: NEGATIVE
Urine Glucose: NEGATIVE
Urobilinogen, UA: 0.2 (ref 0.0–1.0)
pH: 7 (ref 5.0–8.0)

## 2023-01-27 ENCOUNTER — Encounter: Payer: Self-pay | Admitting: Internal Medicine

## 2023-01-27 ENCOUNTER — Ambulatory Visit (INDEPENDENT_AMBULATORY_CARE_PROVIDER_SITE_OTHER): Payer: 59 | Admitting: Internal Medicine

## 2023-01-27 VITALS — BP 140/80 | HR 64 | Temp 98.1°F | Ht 60.0 in | Wt 137.0 lb

## 2023-01-27 DIAGNOSIS — N3281 Overactive bladder: Secondary | ICD-10-CM

## 2023-01-27 DIAGNOSIS — C4490 Unspecified malignant neoplasm of skin, unspecified: Secondary | ICD-10-CM

## 2023-01-27 DIAGNOSIS — C541 Malignant neoplasm of endometrium: Secondary | ICD-10-CM | POA: Diagnosis not present

## 2023-01-27 DIAGNOSIS — D61818 Other pancytopenia: Secondary | ICD-10-CM

## 2023-01-27 DIAGNOSIS — I10 Essential (primary) hypertension: Secondary | ICD-10-CM | POA: Diagnosis not present

## 2023-01-27 DIAGNOSIS — F411 Generalized anxiety disorder: Secondary | ICD-10-CM

## 2023-01-27 MED ORDER — OMEPRAZOLE 20 MG PO CPDR: 20.0000 mg | DELAYED_RELEASE_CAPSULE | Freq: Two times a day (BID) | ORAL | 1 refills | Status: DC

## 2023-01-27 MED ORDER — TRIAMCINOLONE ACETONIDE 0.5 % EX OINT: 1.0000 | TOPICAL_OINTMENT | Freq: Two times a day (BID) | CUTANEOUS | 1 refills | Status: AC

## 2023-01-27 MED ORDER — LORAZEPAM 1 MG PO TABS: 1.0000 mg | ORAL_TABLET | Freq: Every evening | ORAL | 5 refills | Status: DC | PRN

## 2023-01-27 MED ORDER — CEPHALEXIN 500 MG PO CAPS: 500.0000 mg | ORAL_CAPSULE | Freq: Every day | ORAL | 5 refills | Status: DC

## 2023-01-27 NOTE — Assessment & Plan Note (Signed)
Chronic Cont on Captopril Risks associated with treatment noncompliance were discussed. Compliance was encouraged.

## 2023-01-27 NOTE — Assessment & Plan Note (Signed)
Chin nodules - ref to [facility]. See photo

## 2023-01-27 NOTE — Progress Notes (Addendum)
Subjective:  [name], female    DOB: 03/11/1934  Age: 89 y.o. MRN: [id number]  CC: No chief complaint on file.   HPI Sophia Santos presents for UTI, frequency, incontinence C/o insomnia - wants to switch to Lorazepam F/u GERD C/o a skin lesion  Outpatient Medications Prior to Visit  Medication Sig Dispense Refill   acetaminophen (TYLENOL) 325 MG tablet Take 352 mg by mouth every 6 (six) hours as needed for mild pain or moderate pain.     amLODipine (NORVASC) 5 MG tablet TAKE 1 TABLET (5 MG TOTAL) BY MOUTH DAILY. 90 tablet 3   Cholecalciferol 1000 UNITS tablet Take 1,000 Units by mouth daily. Vitamin d     Coenzyme Q10 (CO Q-10) 100 MG CAPS Take by mouth daily.     ezetimibe (ZETIA) 10 MG tablet Take 1 tablet (10 mg total) by mouth daily. 90 tablet 3   Pancrelipase, Lip-Prot-Amyl, (CREON) 24000-76000 units CPEP TAKE ONE CAPSULE BY MOUTH 3 TIMES A DAY WITH FOOD (Patient taking differently: as needed. TAKE ONE CAPSULE BY MOUTH 3 TIMES A DAY WITH FOOD) 270 capsule 3   phenazopyridine (PYRIDIUM) 100 MG tablet Take 1 tablet (100 mg total) by mouth 3 (three) times daily as needed for pain. 15 tablet 1   polyethylene glycol powder (GAVILAX) 17 GM/SCOOP powder MIX 1 CAPFUL (17 GRAMS) AS DIRECTED AND DRINK TWICE A DAY AS NEEDED FOR MODERATE CONSTIPATION 510 g 5   propranolol ER (INDERAL LA) 120 MG 24 hr capsule TAKE 1 CAPSULE BY MOUTH EVERY DAY 90 capsule 2   RESTASIS 0.05 % ophthalmic emulsion Place 1 drop into both eyes 2 (two) times daily.      Vitamin A 2400 MCG (8000 UT) CAPS Take 2,400 mcg by mouth daily.     ALPRAZolam (XANAX) 1 MG tablet TAKE 0.5 TO 1 TABLET BY MOUTH AT BEDTIME AS NEEDED FOR ANXIETY OR SLEEP 90 tablet 1   captopril (CAPOTEN) 50 MG tablet TAKE 1 TABLET BY MOUTH TWICE A DAY 180 tablet 3   mirabegron ER (MYRBETRIQ) 50 MG TB24 tablet Take 1 tablet (50 mg total) by mouth daily. 90 tablet 3   omeprazole (PRILOSEC) 20 MG capsule Take 1 capsule (20 mg total) by  mouth daily. 180 capsule 1   No facility-administered medications prior to visit.    ROS: Review of Systems  Constitutional:  Positive for fatigue. Negative for activity change, appetite change, chills and unexpected weight change.  HENT:  Negative for congestion, mouth sores and sinus pressure.   Eyes:  Negative for visual disturbance.  Respiratory:  Negative for cough and chest tightness.   Cardiovascular:  Negative for leg swelling.  Gastrointestinal:  Negative for abdominal pain and nausea.  Genitourinary:  Positive for frequency and urgency. Negative for difficulty urinating and vaginal pain.  Musculoskeletal:  Positive for gait problem. Negative for back pain.  Skin:  Negative for pallor and rash.  Neurological:  Negative for dizziness, tremors, weakness, numbness and headaches.  Psychiatric/Behavioral:  Negative for confusion, sleep disturbance and suicidal ideas.     Objective:  BP (!) 140/80   Pulse 64   Temp 98.1 F (36.7 C) (Oral)   Ht 5' (1.524 m)   Wt 137 lb (62.1 kg)   SpO2 95%   BMI 26.76 kg/m   BP Readings from Last 3 Encounters:  04/27/23 (!) 142/72  04/03/23 (!) 142/78  01/27/23 (!) 140/80    Wt Readings from Last 3 Encounters:  04/27/23 135 lb (  61.2 kg)  04/03/23 139 lb 1.6 oz (63.1 kg)  01/27/23 137 lb (62.1 kg)    Physical Exam Constitutional:      General: She is not in acute distress.    Appearance: Normal appearance. She is well-developed.  HENT:     Head: Normocephalic.     Right Ear: External ear normal.     Left Ear: External ear normal.     Nose: Nose normal.  Eyes:     General:        Right eye: No discharge.        Left eye: No discharge.     Conjunctiva/sclera: Conjunctivae normal.     Pupils: Pupils are equal, round, and reactive to light.  Neck:     Thyroid: No thyromegaly.     Vascular: No JVD.     Trachea: No tracheal deviation.  Cardiovascular:     Rate and Rhythm: Normal rate and regular rhythm.     Heart sounds:  Normal heart sounds.  Pulmonary:     Effort: No respiratory distress.     Breath sounds: No stridor. No wheezing.  Abdominal:     General: Bowel sounds are normal. There is no distension.     Palpations: Abdomen is soft. There is no mass.     Tenderness: There is no abdominal tenderness. There is no guarding or rebound.  Musculoskeletal:        General: No tenderness.     Cervical back: Normal range of motion and neck supple. No rigidity.  Lymphadenopathy:     Cervical: No cervical adenopathy.  Skin:    Findings: No erythema or rash.  Neurological:     Mental Status: She is oriented to person, place, and time.     Cranial Nerves: No cranial nerve deficit.     Motor: No abnormal muscle tone.     Coordination: Coordination normal.     Deep Tendon Reflexes: Reflexes normal.  Psychiatric:        Behavior: Behavior normal.        Thought Content: Thought content normal.        Judgment: Judgment normal.   Nodular growth chin         A total time of 45 minutes was spent preparing to see the patient, reviewing tests, x-rays, operative reports and other medical records.  Also, obtaining history and performing comprehensive physical exam.  Additionally, counseling the patient regarding the above listed issues.   Finally, documenting clinical information in the health records, coordination of care, educating the patient - ?skin cancer, incontinence, UTIs.  Lab Results  Component Value Date   WBC 5.1 09/10/2022   HGB 12.4 09/10/2022   HCT 36.7 09/10/2022   PLT 211.0 09/10/2022   GLUCOSE 90 09/10/2022   CHOL 238 (H) 05/06/2021   TRIG 106.0 05/06/2021   HDL 65.10 05/06/2021   LDLDIRECT 143.0 03/02/2018   LDLCALC 152 (H) 05/06/2021   ALT 16 09/10/2022   AST 19 09/10/2022   NA 135 09/10/2022   K 4.1 09/10/2022   CL 99 09/10/2022   CREATININE 0.72 09/10/2022   BUN 14 09/10/2022   CO2 29 09/10/2022   TSH 1.39 05/06/2021   INR 1.0 02/23/2020   HGBA1C 5.9 09/29/2007    No  results found.  Assessment & Plan:   Problem List Items Addressed This Visit     Anxiety disorder    Cont on Alprazolam prn  Potential benefits of a long term benzodiazepines  use as  well as potential risks  and complications were explained to the patient and were aknowledged. Grieving.      Relevant Medications   LORazepam (ATIVAN) 1 MG tablet   Essential hypertension    Chronic Cont on Captopril Risks associated with treatment noncompliance were discussed. Compliance was encouraged.       Skin cancer    Chin nodules - ref to [facility]. See photo      Relevant Medications   LORazepam (ATIVAN) 1 MG tablet   Other Relevant Orders   Ambulatory referral to Dermatology   Overactive bladder - Primary    Better on abx Start Keflex 500 mg/d Await Cx On Myrbetriq       Endometrial adenocarcinoma (HCC)    F/u w/GYN      Relevant Medications   LORazepam (ATIVAN) 1 MG tablet   Pancytopenia, acquired (HCC)    Monitoring CBC         Meds ordered this encounter  Medications   LORazepam (ATIVAN) 1 MG tablet    Sig: Take 1 tablet (1 mg total) by mouth at bedtime as needed for anxiety.    Dispense:  30 tablet    Refill:  5   DISCONTD: cephALEXin (KEFLEX) 500 MG capsule    Sig: Take 1 capsule (500 mg total) by mouth daily.    Dispense:  30 capsule    Refill:  5   omeprazole (PRILOSEC) 20 MG capsule    Sig: Take 1 capsule (20 mg total) by mouth 2 (two) times daily before a meal.    Dispense:  180 capsule    Refill:  1   triamcinolone ointment (KENALOG) 0.5 %    Sig: Apply 1 Application topically 2 (two) times daily.    Dispense:  60 g    Refill:  1      Follow-up: Return in about 3 months (around 04/27/2023) for a follow-up visit.  Sonda Primes, MD

## 2023-01-27 NOTE — Assessment & Plan Note (Addendum)
Better on abx Start Keflex 500 mg/d Await Cx On Myrbetriq

## 2023-01-28 LAB — CULTURE, URINE COMPREHENSIVE

## 2023-01-29 ENCOUNTER — Other Ambulatory Visit: Payer: Self-pay | Admitting: Internal Medicine

## 2023-02-05 NOTE — Assessment & Plan Note (Signed)
F/u w/GYN

## 2023-02-05 NOTE — Assessment & Plan Note (Signed)
Monitoring CBC

## 2023-02-05 NOTE — Assessment & Plan Note (Signed)
Cont on Alprazolam prn  Potential benefits of a long term benzodiazepines  use as well as potential risks  and complications were explained to the patient and were aknowledged. Grieving.

## 2023-02-13 ENCOUNTER — Telehealth: Payer: Self-pay

## 2023-02-13 ENCOUNTER — Telehealth: Payer: Self-pay | Admitting: *Deleted

## 2023-02-13 NOTE — Telephone Encounter (Signed)
CALLED PATIENT'S RELATIVE- [name] TO INFORM OF FU APPT. WITH DR. Berline Lopes ON 04-03-23- ARRIVAL TIME- 2:15 PM, LVM FOR A RETURN CALL

## 2023-02-13 NOTE — Telephone Encounter (Signed)
Enid Derry from (RAD ON[facility]) called office.  Pt is scheduled for a follow up on 04/03/23  at 2:30 with Dr. Berline Lopes.  Enid Derry to notify pt of appointment date and time.

## 2023-02-16 ENCOUNTER — Ambulatory Visit: Payer: Medicare Other | Admitting: Internal Medicine

## 2023-03-26 IMAGING — CT CT ABD-PELV W/ CM
2 of 5 series · 16 of 46 positions shown, 18 images · IV contrast (OMNIPAQUE)
Comparison: 09/30/2016.

CLINICAL DATA: Endometrial cancer, value 8 for metastatic disease.

EXAM:
CT ABDOMEN AND PELVIS WITH CONTRAST
TECHNIQUE: Multidetector CT imaging of the abdomen and pelvis was performed
using the standard protocol following bolus administration of
intravenous contrast.
CONTRAST:  75mL OMNIPAQUE IOHEXOL 350 MG/ML SOLN

[Series 2: axial st · axial · 0.79mm/px · z∈[-536,-181]mm · 13 of 83 slices shown, 15 images]
[im 6/83  soft-tissue]
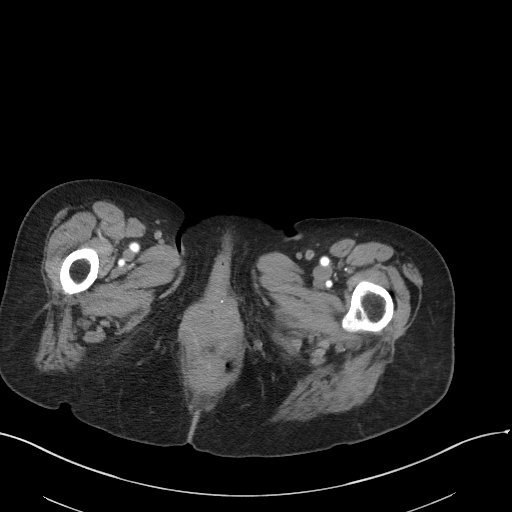
[im 6/83  bone]
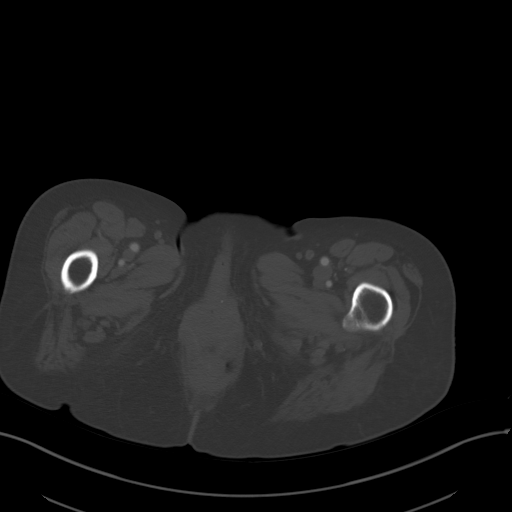
[im 12/83  soft-tissue]
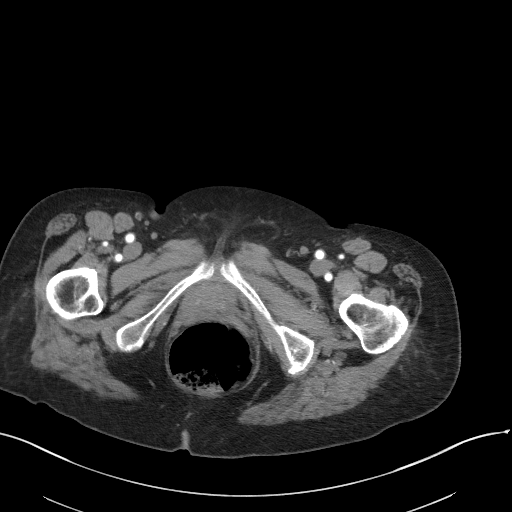
[im 18/83  soft-tissue]
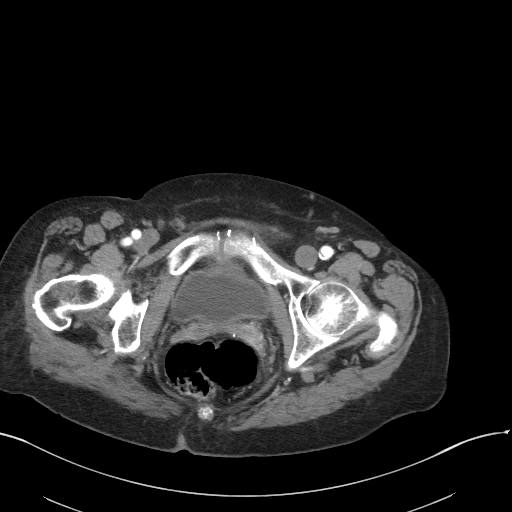
[im 24/83  soft-tissue]
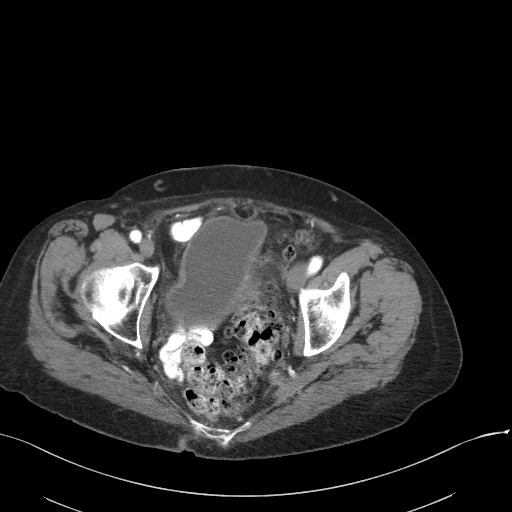
[im 30/83  soft-tissue]
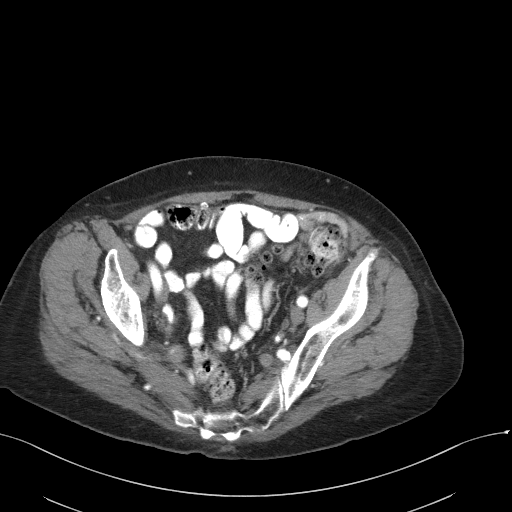
[im 36/83  soft-tissue]
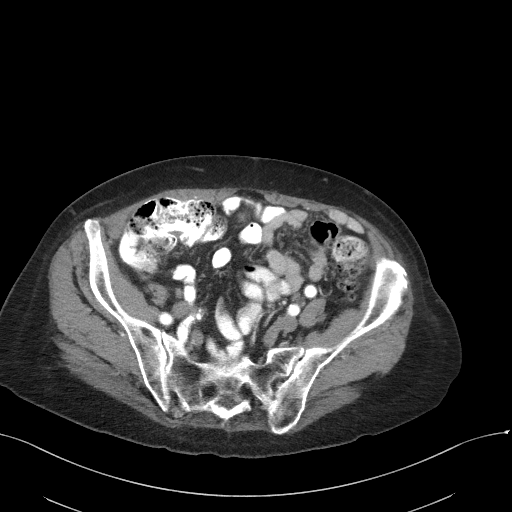
[im 42/83  soft-tissue]
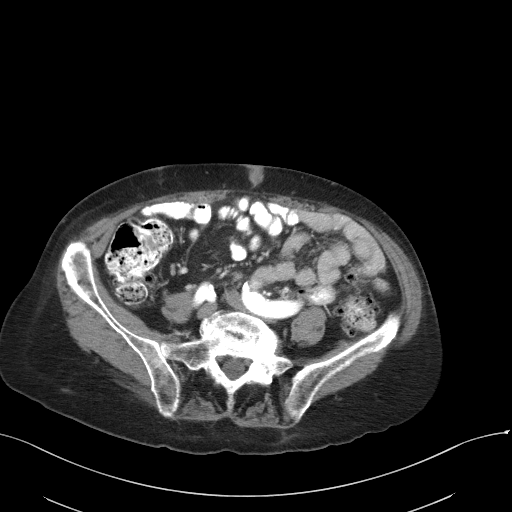
[im 47/83  soft-tissue]
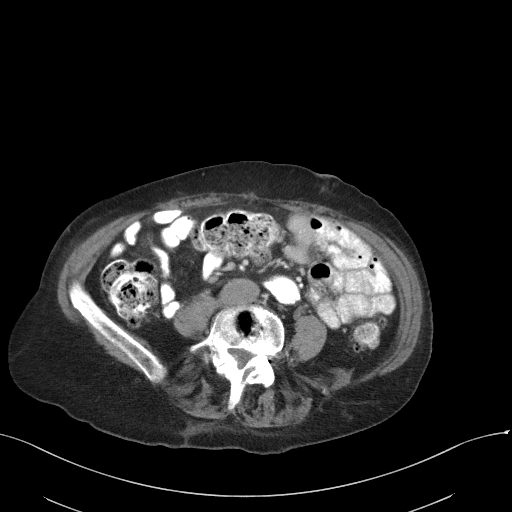
[im 53/83  soft-tissue]
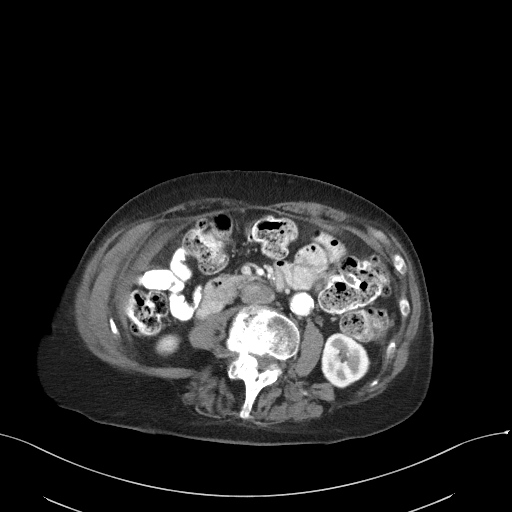
[im 53/83  bone]
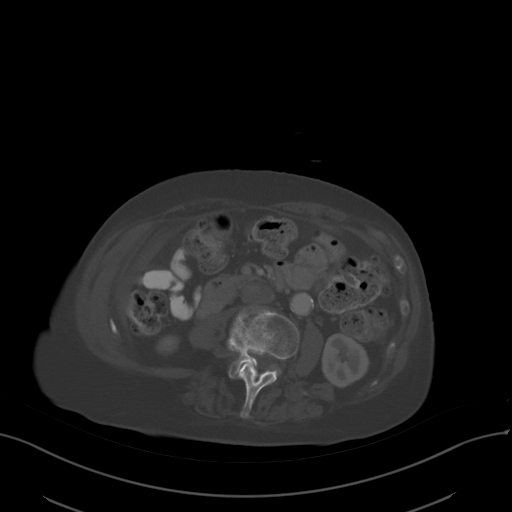
[im 59/83  soft-tissue]
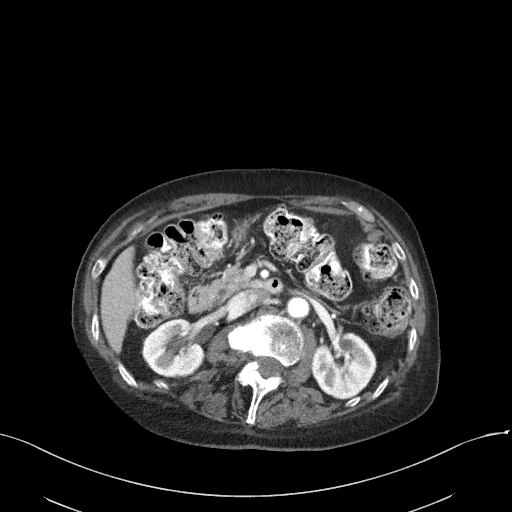
[im 65/83  soft-tissue]
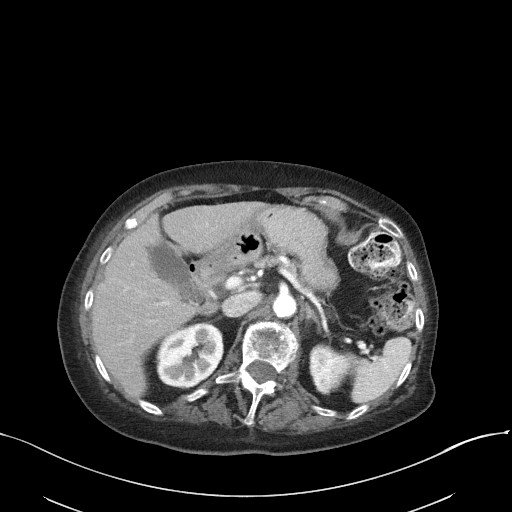
[im 71/83  soft-tissue]
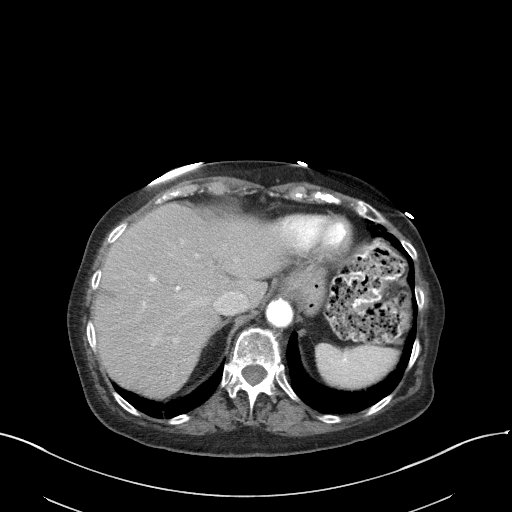
[im 77/83  soft-tissue]
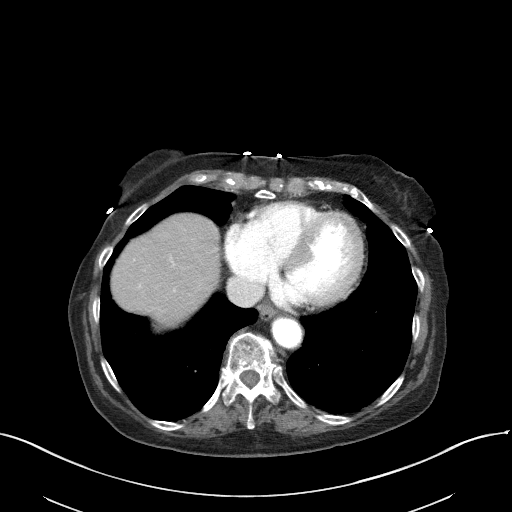

[Series 4: coronal st · coronal · 0.74mm/px · 3 of 76 slices shown]
[im 26/76  soft-tissue]
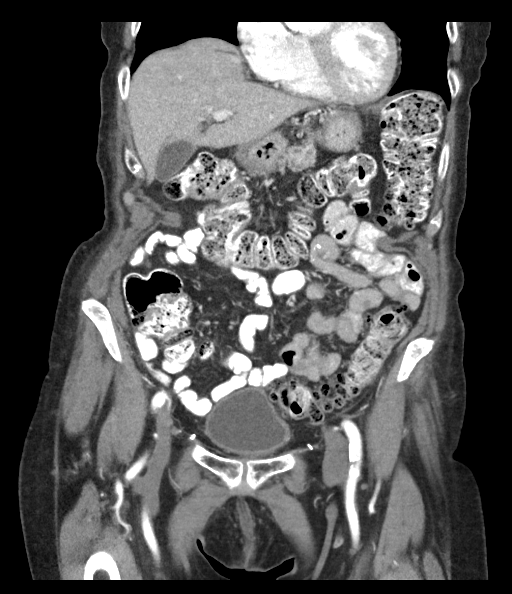
[im 34/76  soft-tissue]
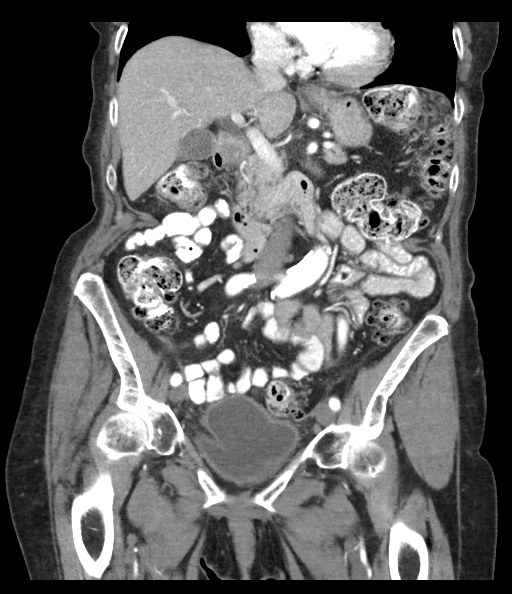
[im 42/76  soft-tissue]
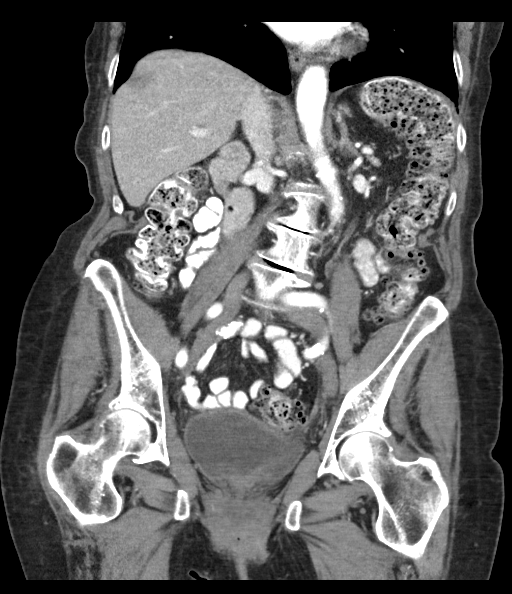

[16 of 46 positions shown; findings below may reference images not displayed]

FINDINGS: Lower chest: Scattered scarring in the lung bases. Heart is at the
upper limits of normal in size. No pericardial or pleural effusion.
Distal esophagus is grossly unremarkable.

Hepatobiliary: Liver is slightly decreased in attenuation diffusely.
Liver and gallbladder are otherwise unremarkable. No biliary ductal
dilatation.

Pancreas: Negative.

Spleen: Negative.

Adrenals/Urinary Tract: Right adrenal gland is unremarkable. 1.6 cm
nodule in the medial limb left adrenal gland is unchanged from
09/30/2016. Right kidney is unremarkable. Subcentimeter
low-attenuation lesions in the left kidney are too small to
characterize but statistically, cysts are likely. Ureters are
decompressed. Bladder is grossly unremarkable.

Stomach/Bowel: Tiny hiatal hernia. Stomach is decompressed. Stomach,
small bowel, appendix and colon are otherwise unremarkable.

Vascular/Lymphatic: Atherosclerotic calcification of the aorta. No
pathologically enlarged lymph nodes.

Reproductive: Hysterectomy.  No adnexal mass.

Other: Pelvic ventral hernia repair. No free fluid. Mesenteries and
peritoneum are unremarkable.

Musculoskeletal: Degenerative changes in the spine. No worrisome
lytic or sclerotic lesions.
IMPRESSION: 1. No evidence of metastatic disease.
2. Hepatic steatosis.
3. Left adrenal adenoma.
4.  Aortic atherosclerosis ([id number]).

## 2023-04-02 NOTE — Progress Notes (Signed)
Gynecologic Oncology Return Clinic Visit  04/03/23  Reason for Visit: Surveillance visit the setting of endometrial cancer history   Treatment History: Oncology History Overview Note  Endometrioid FIGO grade 1-2, MSI high   Endometrial adenocarcinoma  07/19/2021 Imaging   US pelvis 1. Endometrial thickness of 5.6 mm. In the setting of post-menopausal bleeding, endometrial sampling is indicated to exclude carcinoma.  2. Densely calcified shadowing mass within the right uterus presumably representing a calcified fibroid.   07/31/2021 Pathology Results   SURGICAL PATHOLOGY   FINAL MICROSCOPIC DIAGNOSIS:   A. ENDOMETRIUM, BIOPSY:  - Endometrial adenocarcinoma, FIGO grade 1.  See comment  SUMMARY INTERPRETATION: ABNORMAL   There is loss of the major and minor MMR proteins MLH1 and PMS2. The loss of expression may be secondary to promoter hyper-methylation, gene mutation or other genetic event. BRAF mutation testing and/or MLH1 methylation testing is indicated. The presence of a BRAF mutation and/or MLH1 hypermethylation is indicative of a sporadic-type tumor. The absence of either BRAF mutation and/or presence of normal methylation indicate the possible presence of a hereditary germline mutation (e.g. Lynch syndrome) and referral to genetic counseling is warranted.    08/05/2021 Initial Diagnosis   Endometrial adenocarcinoma (HCC)   08/21/2021 Pathology Results   FINAL MICROSCOPIC DIAGNOSIS:   A. LYMPH NODE, SENTINEL, RIGHT OBTURATOR, EXCISION:  - Lymph node, negative for carcinoma (0/1)   B. LYMPH NODE, SENTINEL, LEFT OBTURATOR, EXCISION:  - Lymph node, negative for carcinoma (0/1)   C. UTERUS, CERVIX, BILATERAL FALLOPIAN TUBES AND OVARIES:  - Endometrioid adenocarcinoma, FIGO grade 2  - Carcinoma invades through the entire myometrium and focally involves  the serosal surface  - Benign unremarkable cervix  - Benign bilateral fallopian tubes and ovaries  - See oncology table  -  See comment   COMMENT:   C.  Tumor showed areas with clear cell morphology but immunostains for Napsin A and P504S do not show evidence of clear cell carcinoma.  Also, there is significant tumor necrosis, but the viable portion of tumor appears to be FIGO grade 2.  Dr. Berneice Heinrich reviewed the select slides from the case and concurs with presence of serosal invasion.    ONCOLOGY TABLE:   UTERUS, CARCINOMA OR CARCINOSARCOMA: Resection   Procedure: Total hysterectomy and bilateral salpingo-oophorectomy  Histologic Type: Endometrioid adenocarcinoma  Histologic Grade: FIGO grade 1  Myometrial Invasion:       Depth of Myometrial Invasion (mm): 17 mm       Myometrial Thickness (mm): 17 mm       Percentage of Myometrial Invasion: 100%  Uterine Serosa Involvement: Present, focal  Cervical stromal Involvement: Not identified  Extent of involvement of other tissue/organs: Not identified  Peritoneal/Ascitic Fluid: Not applicable  Lymphovascular Invasion: Not identified  Regional Lymph Nodes:       Pelvic Lymph Nodes Examined:                                   2 Sentinel                                   0 Non-sentinel                                   2 Total  Pelvic Lymph Nodes with Metastasis: 0       Para-aortic Lymph Nodes Examined:                                    0 Sentinel                                    0 Non-sentinel                                    0 Total       Para-aortic Lymph Nodes with Metastasis: 0  Distant Metastasis:       Distant Site(s) Involved: Not applicable  Pathologic Stage Classification (pTNM, AJCC 8th Edition): pT3a, pN0  Ancillary Studies: MMR / MSI testing will be ordered  Representative Tumor Block: C10  Comment(s): Pancytokeratin was performed on the lymph nodes and is negative.    08/21/2021 Surgery   Surgeon: Quinn Axe    Operation: Robotic-assisted laparoscopic total hysterectomy with bilateral salpingoophorectomy, SLN biopsy      Operative Findings:  : 6cm uterus with anterior fibroid (approx 4cm). Normal appearing tubes and ovaries. No suspicious nodes. Bilateral mapping.     10/03/2021 Cancer Staging   Staging form: Corpus Uteri - Carcinoma and Carcinosarcoma, AJCC 8th Edition - Clinical stage from 10/03/2021: FIGO Stage III (cT3, cN0, cM0) - Signed by Artis Delay, MD on 10/03/2021 Stage prefix: Initial diagnosis   10/14/2021 - 01/20/2022 Chemotherapy   Patient is on Treatment Plan : Uterine Carboplatin (AUC 5) 21d      11/20/2021 - 12/18/2021 Radiation Therapy   11/20/2021 through 12/18/2021 Site Technique Total Dose (Gy) Dose per Fx (Gy) Completed Fx Beam Energies  Vagina: Pelvis HDR-brachy 30/30 6 5/5 Ir-192      03/21/2022 Imaging   No evidence of recurrent or metastatic carcinoma within the abdomen or pelvis.   Colonic diverticulosis, without radiographic evidence of diverticulitis.   Stable small benign left adrenal adenoma.   Aortic Atherosclerosis (ICD10-I70.0).     Interval History: Patient reports overall doing well.  She endorses a good appetite without nausea or emesis.  She has intermittent pelvic pain, takes Tylenol with good relief.  Denies any change in this since her last visit with me.  Denies any vaginal bleeding or discharge.  Has intermittent constipation, uses MiraLAX as needed.  Urinary symptoms are unchanged, reports both frequency and incontinence.  Has had some worsening of her balance, using a cane or walker all the time now to ambulate.  Past Medical/Surgical History: Past Medical History:  Diagnosis Date   Arthritis    Complication of anesthesia    "years ago, hard to awaken, no with recent procedures."   Diverticulosis    Endometrial cancer    Family history of colon cancer 10/15/2021   Gallstones    GERD (gastroesophageal reflux disease)    Headache 08/15/2021   none recent   History of radiation therapy    Endometrium- vcc brachytherapy 11/20/21-12/18/21- Dr. Antony Blackbird   HTN (hypertension)    Hyperlipidemia    Irritable bowel syndrome (IBS)    Osteoarthritis    Osteopenia    Osteoporosis    PMB (postmenopausal bleeding) 08/15/2021   Skin cancer    skin cancer -  Urinary frequency 08/15/2021   Wears glasses 08/15/2021    Past Surgical History:  Procedure Laterality Date   bilateral cataracts     COLONOSCOPY     INGUINAL HERNIA REPAIR Bilateral 09/13/2020   Procedure: LAPAROSCOPIC BILATERAL INGUINAL HERNIA REPAIR WITH MESH;  Surgeon: Axel Filler, MD;  Location: [facility] OR;  Service: General;  Laterality: Bilateral;   ROBOTIC ASSISTED TOTAL HYSTERECTOMY WITH BILATERAL SALPINGO OOPHERECTOMY Bilateral 08/21/2021   Procedure: XI ROBOTIC ASSISTED TOTAL HYSTERECTOMY WITH BILATERAL SALPINGO OOPHORECTOMY;  Surgeon: Adolphus Birchwood, MD;  Location: [facility];  Service: Gynecology;  Laterality: Bilateral;   SENTINEL NODE BIOPSY N/A 08/21/2021   Procedure: SENTINEL NODE BIOPSY;  Surgeon: Adolphus Birchwood, MD;  Location: [facility];  Service: Gynecology;  Laterality: N/A;   uterine prolapse surgery      Family History  Problem Relation Age of Onset   Colon cancer Mother 3   Stroke Mother    Parkinson's disease Father    Dementia Father    Colon cancer Sister 63   Colon polyps Sister    Cancer Maternal Aunt        unknown type   Cancer Paternal Aunt        unknown type; ?intestinal   Coronary artery disease Other        female 1st degree <50   Cancer Cousin        maternal female cousins; x3; unknown type   Breast cancer Cousin        maternal female cousin   Esophageal cancer Neg Hx    Stomach cancer Neg Hx    Anesthesia problems Neg Hx    Endometrial cancer Neg Hx    Pancreatic cancer Neg Hx    Prostate cancer Neg Hx     Social History   Socioeconomic History   Marital status: Widowed    Spouse name: Not on file   Number of children: 1   Years of education: Not on file   Highest education level: Not on file   Occupational History   Occupation: retired  Tobacco Use   Smoking status: Never   Smokeless tobacco: Never  Vaping Use   Vaping Use: Never used  Substance and Sexual Activity   Alcohol use: No   Drug use: No   Sexual activity: Not Currently    Birth control/protection: None  Other Topics Concern   Not on file  Social History Narrative   One Boy    Daily Caffeine - tea         Social Determinants of Health   Financial Resource Strain: Low Risk  (09/24/2022)   Overall Financial Resource Strain (CARDIA)    Difficulty of Paying Living Expenses: Not hard at all  Food Insecurity: No Food Insecurity (09/24/2022)   Hunger Vital Sign    Worried About Running Out of Food in the Last Year: Never true    Ran Out of Food in the Last Year: Never true  Transportation Needs: No Transportation Needs (09/24/2022)   PRAPARE - Administrator, Civil Service (Medical): No    Lack of Transportation (Non-Medical): No  Physical Activity: Inactive (09/24/2022)   Exercise Vital Sign    Days of Exercise per Week: 0 days    Minutes of Exercise per Session: 0 min  Stress: No Stress Concern Present (09/24/2022)   [employer] of Occupational Health - Occupational Stress Questionnaire    Feeling of Stress : Not at all  Social Connections: Moderately Integrated (  09/24/2022)   Social Connection and Isolation Panel [NHANES]    Frequency of Communication with Friends and Family: More than three times a week    Frequency of Social Gatherings with Friends and Family: More than three times a week    Attends Religious Services: More than 4 times per year    Active Member of [employer] or Organizations: Yes    Attends Banker Meetings: More than 4 times per year    Marital Status: Widowed    Current Medications:  Current Outpatient Medications:    acetaminophen (TYLENOL) 325 MG tablet, Take 352 mg by mouth every 6 (six) hours as needed for mild pain or moderate pain., Disp: ,  Rfl:    amLODipine (NORVASC) 5 MG tablet, TAKE 1 TABLET (5 MG TOTAL) BY MOUTH DAILY., Disp: 90 tablet, Rfl: 3   captopril (CAPOTEN) 50 MG tablet, TAKE 1 TABLET BY MOUTH TWICE A DAY, Disp: 180 tablet, Rfl: 1   cephALEXin (KEFLEX) 500 MG capsule, Take 1 capsule (500 mg total) by mouth daily., Disp: 30 capsule, Rfl: 5   Cholecalciferol 1000 UNITS tablet, Take 1,000 Units by mouth daily. Vitamin d, Disp: , Rfl:    Coenzyme Q10 (CO Q-10) 100 MG CAPS, Take by mouth daily., Disp: , Rfl:    ezetimibe (ZETIA) 10 MG tablet, Take 1 tablet (10 mg total) by mouth daily., Disp: 90 tablet, Rfl: 3   LORazepam (ATIVAN) 1 MG tablet, Take 1 tablet (1 mg total) by mouth at bedtime as needed for anxiety., Disp: 30 tablet, Rfl: 5   MYRBETRIQ 50 MG TB24 tablet, TAKE 1 TABLET BY MOUTH EVERY DAY, Disp: 90 tablet, Rfl: 3   omeprazole (PRILOSEC) 20 MG capsule, Take 1 capsule (20 mg total) by mouth 2 (two) times daily before a meal., Disp: 180 capsule, Rfl: 1   Pancrelipase, Lip-Prot-Amyl, (CREON) 24000-76000 units CPEP, TAKE ONE CAPSULE BY MOUTH 3 TIMES A DAY WITH FOOD (Patient taking differently: as needed. TAKE ONE CAPSULE BY MOUTH 3 TIMES A DAY WITH FOOD), Disp: 270 capsule, Rfl: 3   phenazopyridine (PYRIDIUM) 100 MG tablet, Take 1 tablet (100 mg total) by mouth 3 (three) times daily as needed for pain., Disp: 15 tablet, Rfl: 1   polyethylene glycol powder (GAVILAX) 17 GM/SCOOP powder, MIX 1 CAPFUL (17 GRAMS) AS DIRECTED AND DRINK TWICE A DAY AS NEEDED FOR MODERATE CONSTIPATION, Disp: 510 g, Rfl: 5   propranolol ER (INDERAL LA) 120 MG 24 hr capsule, TAKE 1 CAPSULE BY MOUTH EVERY DAY, Disp: 90 capsule, Rfl: 2   RESTASIS 0.05 % ophthalmic emulsion, Place 1 drop into both eyes 2 (two) times daily. , Disp: , Rfl:    triamcinolone ointment (KENALOG) 0.5 %, Apply 1 Application topically 2 (two) times daily., Disp: 60 g, Rfl: 1   Vitamin A 2400 MCG (8000 UT) CAPS, Take 2,400 mcg by mouth daily., Disp: , Rfl:   Review of  Systems: + Vision problems, abdominal pain, urinary frequency, incontinence, joint pain, gait problem, easy bruising/bleeding. Denies appetite changes, fevers, chills, fatigue, unexplained weight changes. Denies hearing loss, neck lumps or masses, mouth sores, ringing in ears or voice changes. Denies cough or wheezing.  Denies shortness of breath. Denies chest pain or palpitations. Denies leg swelling. Denies abdominal distention, blood in stools, constipation, diarrhea, nausea, vomiting, or early satiety. Denies pain with intercourse, dysuria, or hematuria. Denies hot flashes, pelvic pain, vaginal bleeding or vaginal discharge.   Denies back pain or muscle pain/cramps. Denies itching, rash, or wounds. Denies  dizziness, headaches, numbness or seizures. Denies swollen lymph nodes or glands. Denies anxiety, depression, confusion, or decreased concentration.  Physical Exam: BP (!) 151/75 (BP Location: Left Arm, Patient Position: Sitting) Comment: informed Dr Pricilla Holm; will recheck  Pulse (!) 58   Temp (!) 97.4 F (36.3 C) (Oral)   Wt 139 lb 1.6 oz (63.1 kg)   SpO2 99%   BMI 27.17 kg/m  General: Alert, oriented, no acute distress. HEENT: Normocephalic, atraumatic, sclera anicteric. Chest: Clear to auscultation bilaterally.  No wheezes or rhonchi. Cardiovascular: Regular rate and rhythm, no murmurs. Abdomen: soft, nontender.  Normoactive bowel sounds.  No masses or hepatosplenomegaly appreciated.  Well-healed incisions. Extremities: Grossly normal range of motion.  Warm, well perfused.  No edema bilaterally. Skin: No rashes or lesions noted. Lymphatics: No cervical, supraclavicular, or inguinal adenopathy. GU: Normal appearing external genitalia without erythema, excoriation, or lesions.  Speculum exam reveals mildly atrophic vaginal mucosa as well as some apical prolapse.  No masses or lesions noted.  Bimanual exam reveals no masses or nodularity.  Rectovaginal exam confirms  findings.  Laboratory & Radiologic Studies: None new  Assessment & Plan: Tichina Koebel is a 89 y.o. 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  Completed adjuvant chemotherapy and VBT in 01/2022. Post-treatment imaging in 03/2022 negative for evidence of disease.   Patient is doing well and is NED on exam today.  Discussed her urinary symptoms again today, offered urology or urogynecology referral.  She is not interested in a referral at this time.   Per NCCN surveillance recommendations, we discussed continued surveillance visits every 3 months.  Patient is scheduled to see Dr. Roselind Messier in late July.  I will see her back in October.  We reviewed signs and symptoms that would be concerning for cancer recurrence and I stressed the importance of calling if she develops any of these.  20 minutes of total time was spent for this patient encounter, including preparation, face-to-face counseling with the patient and coordination of care, and documentation of the encounter.  Eugene Garnet, MD  Division of Gynecologic Oncology  Department of Obstetrics and Gynecology  [facility]

## 2023-04-03 ENCOUNTER — Other Ambulatory Visit: Payer: Self-pay | Admitting: Internal Medicine

## 2023-04-03 ENCOUNTER — Encounter: Payer: Self-pay | Admitting: Gynecologic Oncology

## 2023-04-03 ENCOUNTER — Inpatient Hospital Stay: Payer: 59 | Attending: Gynecologic Oncology | Admitting: Gynecologic Oncology

## 2023-04-03 ENCOUNTER — Other Ambulatory Visit: Payer: Self-pay

## 2023-04-03 VITALS — BP 142/78 | HR 58 | Temp 97.4°F | Wt 139.1 lb

## 2023-04-03 DIAGNOSIS — Z90722 Acquired absence of ovaries, bilateral: Secondary | ICD-10-CM | POA: Insufficient documentation

## 2023-04-03 DIAGNOSIS — Z923 Personal history of irradiation: Secondary | ICD-10-CM | POA: Diagnosis not present

## 2023-04-03 DIAGNOSIS — Z90710 Acquired absence of both cervix and uterus: Secondary | ICD-10-CM | POA: Insufficient documentation

## 2023-04-03 DIAGNOSIS — Z8542 Personal history of malignant neoplasm of other parts of uterus: Secondary | ICD-10-CM | POA: Insufficient documentation

## 2023-04-03 DIAGNOSIS — C541 Malignant neoplasm of endometrium: Secondary | ICD-10-CM

## 2023-04-03 DIAGNOSIS — Z9221 Personal history of antineoplastic chemotherapy: Secondary | ICD-10-CM | POA: Insufficient documentation

## 2023-04-03 DIAGNOSIS — Z08 Encounter for follow-up examination after completed treatment for malignant neoplasm: Secondary | ICD-10-CM

## 2023-04-03 NOTE — Patient Instructions (Signed)
It was good to see you today.  I do not see or feel any evidence of cancer recurrence on your exam.  I will see you for follow-up in 6 months.  As always, if you develop any new and concerning symptoms before your next visit, please call to see me sooner.   

## 2023-04-27 ENCOUNTER — Encounter: Payer: Self-pay | Admitting: Internal Medicine

## 2023-04-27 ENCOUNTER — Ambulatory Visit (INDEPENDENT_AMBULATORY_CARE_PROVIDER_SITE_OTHER): Payer: 59 | Admitting: Internal Medicine

## 2023-04-27 VITALS — BP 142/72 | HR 63 | Temp 97.6°F | Ht 60.0 in | Wt 135.0 lb

## 2023-04-27 DIAGNOSIS — D485 Neoplasm of uncertain behavior of skin: Secondary | ICD-10-CM

## 2023-04-27 DIAGNOSIS — B009 Herpesviral infection, unspecified: Secondary | ICD-10-CM | POA: Insufficient documentation

## 2023-04-27 DIAGNOSIS — G43809 Other migraine, not intractable, without status migrainosus: Secondary | ICD-10-CM

## 2023-04-27 DIAGNOSIS — D539 Nutritional anemia, unspecified: Secondary | ICD-10-CM

## 2023-04-27 DIAGNOSIS — K089 Disorder of teeth and supporting structures, unspecified: Secondary | ICD-10-CM | POA: Diagnosis not present

## 2023-04-27 DIAGNOSIS — C4490 Unspecified malignant neoplasm of skin, unspecified: Secondary | ICD-10-CM | POA: Diagnosis not present

## 2023-04-27 DIAGNOSIS — D61818 Other pancytopenia: Secondary | ICD-10-CM

## 2023-04-27 DIAGNOSIS — F411 Generalized anxiety disorder: Secondary | ICD-10-CM | POA: Diagnosis not present

## 2023-04-27 DIAGNOSIS — F4321 Adjustment disorder with depressed mood: Secondary | ICD-10-CM

## 2023-04-27 DIAGNOSIS — R413 Other amnesia: Secondary | ICD-10-CM

## 2023-04-27 DIAGNOSIS — R300 Dysuria: Secondary | ICD-10-CM

## 2023-04-27 LAB — URINALYSIS
Bilirubin Urine: NEGATIVE
Hgb urine dipstick: NEGATIVE
Leukocytes,Ua: NEGATIVE
Nitrite: NEGATIVE
Specific Gravity, Urine: 1.025 (ref 1.000–1.030)
Total Protein, Urine: NEGATIVE
Urine Glucose: NEGATIVE
Urobilinogen, UA: 0.2 (ref 0.0–1.0)
pH: 6 (ref 5.0–8.0)

## 2023-04-27 LAB — CBC WITH DIFFERENTIAL/PLATELET
Basophils Absolute: 0 10*3/uL (ref 0.0–0.1)
Basophils Relative: 0.5 % (ref 0.0–3.0)
Eosinophils Absolute: 0.1 10*3/uL (ref 0.0–0.7)
Eosinophils Relative: 1.3 % (ref 0.0–5.0)
HCT: 37.6 % (ref 36.0–46.0)
Hemoglobin: 12.7 g/dL (ref 12.0–15.0)
Lymphocytes Relative: 37 % (ref 12.0–46.0)
Lymphs Abs: 2.2 10*3/uL (ref 0.7–4.0)
MCHC: 33.9 g/dL (ref 30.0–36.0)
MCV: 94.4 fl (ref 78.0–100.0)
Monocytes Absolute: 0.6 10*3/uL (ref 0.1–1.0)
Monocytes Relative: 10.4 % (ref 3.0–12.0)
Neutro Abs: 3 10*3/uL (ref 1.4–7.7)
Neutrophils Relative %: 50.8 % (ref 43.0–77.0)
Platelets: 220 10*3/uL (ref 150.0–400.0)
RBC: 3.98 Mil/uL (ref 3.87–5.11)
RDW: 14 % (ref 11.5–15.5)
WBC: 6 10*3/uL (ref 4.0–10.5)

## 2023-04-27 LAB — COMPREHENSIVE METABOLIC PANEL
ALT: 18 U/L (ref 0–35)
AST: 18 U/L (ref 0–37)
Albumin: 4.1 g/dL (ref 3.5–5.2)
Alkaline Phosphatase: 95 U/L (ref 39–117)
BUN: 21 mg/dL (ref 6–23)
CO2: 27 mEq/L (ref 19–32)
Calcium: 9.2 mg/dL (ref 8.4–10.5)
Chloride: 100 mEq/L (ref 96–112)
Creatinine, Ser: 0.76 mg/dL (ref 0.40–1.20)
GFR: 70.6 mL/min (ref >60.00)
Glucose, Bld: 99 mg/dL (ref 70–99)
Potassium: 3.6 mEq/L (ref 3.5–5.1)
Sodium: 135 mEq/L (ref 135–145)
Total Bilirubin: 0.3 mg/dL (ref 0.2–1.2)
Total Protein: 6.9 g/dL (ref 6.0–8.3)

## 2023-04-27 MED ORDER — ALPRAZOLAM 1 MG PO TABS: 1.0000 mg | ORAL_TABLET | Freq: Every evening | ORAL | 1 refills | Status: DC | PRN

## 2023-04-27 MED ORDER — CEPHALEXIN 500 MG PO CAPS: 500.0000 mg | ORAL_CAPSULE | Freq: Four times a day (QID) | ORAL | 3 refills | Status: DC

## 2023-04-27 MED ORDER — ACYCLOVIR 400 MG PO TABS: 400.0000 mg | ORAL_TABLET | Freq: Three times a day (TID) | ORAL | 3 refills | Status: AC

## 2023-04-27 NOTE — Addendum Note (Signed)
Addended by: Tresa Garter on: 04/27/2023 03:00 PM   Modules accepted: Orders, Level of Service

## 2023-04-27 NOTE — Assessment & Plan Note (Signed)
Check CBC 

## 2023-04-27 NOTE — Assessment & Plan Note (Signed)
Floss/brush implants

## 2023-04-27 NOTE — Assessment & Plan Note (Signed)
Chin nodules - ref to [facility]. See photo 01/2023

## 2023-04-27 NOTE — Assessment & Plan Note (Signed)
Use depends at night

## 2023-04-27 NOTE — Assessment & Plan Note (Signed)
Chronic. 

## 2023-04-27 NOTE — Assessment & Plan Note (Signed)
Acyclovir  Rx prn

## 2023-04-27 NOTE — Assessment & Plan Note (Signed)
Recurrent migraines  Cont w/Fioricet rare use  Potential benefits of a long term fioricet  use as well as potential risks  and complications were explained to the patient and were aknowledged.

## 2023-04-27 NOTE — Assessment & Plan Note (Signed)
MCD Needs help at home 80 h/month 

## 2023-04-27 NOTE — Assessment & Plan Note (Signed)
Chin Ref to [facility] was made

## 2023-04-27 NOTE — Assessment & Plan Note (Signed)
Chronic - worse Cont on Alprazolam prn  Potential benefits of a long term benzodiazepines  use as well as potential risks  and complications were explained to the patient and were aknowledged. Son died in the fall of 2021/05/26 Grieving. Discussed

## 2023-04-27 NOTE — Progress Notes (Addendum)
Subjective:  [name], female    DOB: 06/19/1934  Age: 89 y.o. MRN: [id number]  CC: No chief complaint on file.   HPI Meiah Prescher presents for HAs, cystitis C/o insomnia and anxiety, H simplex  Outpatient Medications Prior to Visit  Medication Sig Dispense Refill   acetaminophen (TYLENOL) 325 MG tablet Take 352 mg by mouth every 6 (six) hours as needed for mild pain or moderate pain.     amLODipine (NORVASC) 5 MG tablet TAKE 1 TABLET (5 MG TOTAL) BY MOUTH DAILY. 90 tablet 3   captopril (CAPOTEN) 50 MG tablet TAKE 1 TABLET BY MOUTH TWICE A DAY 180 tablet 1   Cholecalciferol 1000 UNITS tablet Take 1,000 Units by mouth daily. Vitamin d     Coenzyme Q10 (CO Q-10) 100 MG CAPS Take by mouth daily.     ezetimibe (ZETIA) 10 MG tablet Take 1 tablet (10 mg total) by mouth daily. 90 tablet 3   omeprazole (PRILOSEC) 20 MG capsule Take 1 capsule (20 mg total) by mouth 2 (two) times daily before a meal. 180 capsule 1   Pancrelipase, Lip-Prot-Amyl, (CREON) 24000-76000 units CPEP TAKE ONE CAPSULE BY MOUTH 3 TIMES A DAY WITH FOOD (Patient taking differently: as needed. TAKE ONE CAPSULE BY MOUTH 3 TIMES A DAY WITH FOOD) 270 capsule 3   phenazopyridine (PYRIDIUM) 100 MG tablet Take 1 tablet (100 mg total) by mouth 3 (three) times daily as needed for pain. 15 tablet 1   polyethylene glycol powder (GAVILAX) 17 GM/SCOOP powder MIX 1 CAPFUL (17 GRAMS) AS DIRECTED AND DRINK TWICE A DAY AS NEEDED FOR MODERATE CONSTIPATION 510 g 5   propranolol ER (INDERAL LA) 120 MG 24 hr capsule TAKE 1 CAPSULE BY MOUTH EVERY DAY 90 capsule 2   RESTASIS 0.05 % ophthalmic emulsion Place 1 drop into both eyes 2 (two) times daily.      triamcinolone ointment (KENALOG) 0.5 % Apply 1 Application topically 2 (two) times daily. 60 g 1   Vitamin A 2400 MCG (8000 UT) CAPS Take 2,400 mcg by mouth daily.     cephALEXin (KEFLEX) 500 MG capsule Take 1 capsule (500 mg total) by mouth daily. 30 capsule 5   LORazepam (ATIVAN) 1  MG tablet Take 1 tablet (1 mg total) by mouth at bedtime as needed for anxiety. 30 tablet 5   MYRBETRIQ 50 MG TB24 tablet TAKE 1 TABLET BY MOUTH EVERY DAY 90 tablet 3   No facility-administered medications prior to visit.    ROS: Review of Systems  Constitutional:  Negative for activity change, appetite change, chills, fatigue and unexpected weight change.  HENT:  Negative for congestion, mouth sores and sinus pressure.   Eyes:  Negative for visual disturbance.  Respiratory:  Negative for cough and chest tightness.   Gastrointestinal:  Negative for abdominal pain and nausea.  Genitourinary:  Positive for dysuria. Negative for difficulty urinating, frequency and vaginal pain.  Musculoskeletal:  Negative for back pain and gait problem.  Skin:  Negative for pallor and rash.  Neurological:  Positive for headaches. Negative for dizziness, tremors, weakness and numbness.  Psychiatric/Behavioral:  Positive for decreased concentration. Negative for behavioral problems, sleep disturbance and suicidal ideas.     Objective:  BP (!) 142/72 (BP Location: Left Arm, Patient Position: Sitting, Cuff Size: Normal)   Pulse 63   Temp 97.6 F (36.4 C) (Oral)   Ht 5' (1.524 m)   Wt 135 lb (61.2 kg)   SpO2 95%   BMI  26.37 kg/m   BP Readings from Last 3 Encounters:  04/27/23 (!) 142/72  04/03/23 (!) 142/78  01/27/23 (!) 140/80    Wt Readings from Last 3 Encounters:  04/27/23 135 lb (61.2 kg)  04/03/23 139 lb 1.6 oz (63.1 kg)  01/27/23 137 lb (62.1 kg)    Physical Exam Constitutional:      General: She is not in acute distress.    Appearance: She is well-developed.  HENT:     Head: Normocephalic.     Right Ear: External ear normal.     Left Ear: External ear normal.     Nose: Nose normal.  Eyes:     General:        Right eye: No discharge.        Left eye: No discharge.     Conjunctiva/sclera: Conjunctivae normal.     Pupils: Pupils are equal, round, and reactive to light.  Neck:      Thyroid: No thyromegaly.     Vascular: No JVD.     Trachea: No tracheal deviation.  Cardiovascular:     Rate and Rhythm: Normal rate and regular rhythm.     Heart sounds: Normal heart sounds.  Pulmonary:     Effort: No respiratory distress.     Breath sounds: No stridor. No wheezing.  Abdominal:     General: Bowel sounds are normal. There is no distension.     Palpations: Abdomen is soft. There is no mass.     Tenderness: There is no abdominal tenderness. There is no guarding or rebound.  Musculoskeletal:        General: No tenderness.     Cervical back: Normal range of motion and neck supple. No rigidity.  Lymphadenopathy:     Cervical: No cervical adenopathy.  Skin:    Findings: No erythema or rash.  Neurological:     Mental Status: Mental status is at baseline.     Cranial Nerves: No cranial nerve deficit.     Motor: No abnormal muscle tone.     Coordination: Coordination normal.     Gait: Gait abnormal.     Deep Tendon Reflexes: Reflexes normal.  Psychiatric:        Behavior: Behavior normal.        Thought Content: Thought content normal.        Judgment: Judgment normal.   Chin - papule w/ulcer    A total time of 45 minutes was spent preparing to see the patient, reviewing tests, x-rays, operative reports and other medical records.  Also, obtaining history and performing comprehensive physical exam.  Additionally, counseling the patient regarding the above listed issues.   Finally, documenting clinical information in the health records, coordination of care, educating the patient.    Lab Results  Component Value Date   WBC 5.1 09/10/2022   HGB 12.4 09/10/2022   HCT 36.7 09/10/2022   PLT 211.0 09/10/2022   GLUCOSE 90 09/10/2022   CHOL 238 (H) 05/06/2021   TRIG 106.0 05/06/2021   HDL 65.10 05/06/2021   LDLDIRECT 143.0 03/02/2018   LDLCALC 152 (H) 05/06/2021   ALT 16 09/10/2022   AST 19 09/10/2022   NA 135 09/10/2022   K 4.1 09/10/2022   CL 99 09/10/2022    CREATININE 0.72 09/10/2022   BUN 14 09/10/2022   CO2 29 09/10/2022   TSH 1.39 05/06/2021   INR 1.0 02/23/2020   HGBA1C 5.9 09/29/2007    No results found.  Assessment & Plan:  Problem List Items Addressed This Visit     Anxiety disorder    Chronic - worse Cont on Alprazolam prn  Potential benefits of a long term benzodiazepines  use as well as potential risks  and complications were explained to the patient and were aknowledged. Son died in the fall of 06-03-2021 Grieving. Discussed      Relevant Medications   ALPRAZolam (XANAX) 1 MG tablet   Migraines - Primary    Recurrent migraines  Cont w/Fioricet rare use  Potential benefits of a long term fioricet  use as well as potential risks  and complications were explained to the patient and were aknowledged.      Skin cancer    Chin nodules - ref to [facility]. See photo 01/2023      Relevant Medications   cephALEXin (KEFLEX) 500 MG capsule   ALPRAZolam (XANAX) 1 MG tablet   acyclovir (ZOVIRAX) 400 MG tablet   Dysuria    Use depends at night      Relevant Orders   CBC with Differential/Platelet   Comprehensive metabolic panel   Urinalysis   Deficiency anemia    Check CBC      Relevant Orders   CBC with Differential/Platelet   Comprehensive metabolic panel   Urinalysis   Pancytopenia, acquired (HCC)    Check CBC      Relevant Orders   CBC with Differential/Platelet   Grief    Chronic       Poor dentition    Floss/brush implants      Memory deficit    MCD Needs help at home 80 h/month      Neoplasm of uncertain behavior of skin    Chin Ref to Derm was made      Herpes simplex    Acyclovir  Rx prn      Relevant Medications   cephALEXin (KEFLEX) 500 MG capsule   acyclovir (ZOVIRAX) 400 MG tablet      Meds ordered this encounter  Medications   cephALEXin (KEFLEX) 500 MG capsule    Sig: Take 1 capsule (500 mg total) by mouth 4 (four) times daily.    Dispense:  12 capsule     Refill:  3   ALPRAZolam (XANAX) 1 MG tablet    Sig: Take 1 tablet (1 mg total) by mouth at bedtime as needed for anxiety.    Dispense:  90 tablet    Refill:  1   acyclovir (ZOVIRAX) 400 MG tablet    Sig: Take 1 tablet (400 mg total) by mouth 3 (three) times daily.    Dispense:  21 tablet    Refill:  3    My Note      Progress Notes  No service  04/27/2023 01:40 PM  HPIROSPhysical Exam  Insert SmartText     Follow-up: Return in about 4 months (around 08/28/2023) for a follow-up visit.  Sonda Primes, MD

## 2023-04-29 ENCOUNTER — Telehealth: Payer: Self-pay | Admitting: Internal Medicine

## 2023-04-29 NOTE — Telephone Encounter (Signed)
Pt daughter called wanted the referral sent back over to [facility] pt daughter sated they had spoken about this in office on Monday.

## 2023-05-01 ENCOUNTER — Encounter: Payer: Self-pay | Admitting: Internal Medicine

## 2023-05-01 NOTE — Telephone Encounter (Signed)
The referral was sent there in February of this year.  It should be active.

## 2023-05-01 NOTE — Telephone Encounter (Signed)
[facility] office for clarification but was sent to a voicemail. Per referral note she has been referred and they are ready to schedule. I do not see a biopsy.

## 2023-05-05 NOTE — Telephone Encounter (Signed)
Can we resend referral pt keep stating that they haven't received the referral left VM when I called over to that location.

## 2023-05-06 NOTE — Telephone Encounter (Signed)
Are you willing to do skin biopsy. They will not make appt w/ them until she have done.Marland KitchenRaechel Chute

## 2023-05-06 NOTE — Telephone Encounter (Signed)
Patient needs biopsy done to be seen for referral. [facility] said they have not received a referral per daughter-in-law. Best callback for daughter-in-law Eunice Blase is [phone].

## 2023-05-06 NOTE — Telephone Encounter (Signed)
Called pt daughter (Milia) made appt for 05/22/23 @ 1:20.Marland KitchenRaechel Chute

## 2023-05-22 ENCOUNTER — Ambulatory Visit (INDEPENDENT_AMBULATORY_CARE_PROVIDER_SITE_OTHER): Payer: 59 | Admitting: Internal Medicine

## 2023-05-22 ENCOUNTER — Encounter: Payer: Self-pay | Admitting: Internal Medicine

## 2023-05-22 ENCOUNTER — Other Ambulatory Visit (HOSPITAL_COMMUNITY)
Admission: RE | Admit: 2023-05-22 | Discharge: 2023-05-22 | Disposition: A | Discharge status: Home / Self Care | Payer: 59 | Source: Ambulatory Visit | Attending: Internal Medicine | Admitting: Internal Medicine

## 2023-05-22 ENCOUNTER — Other Ambulatory Visit: Payer: Self-pay | Admitting: Internal Medicine

## 2023-05-22 DIAGNOSIS — C44319 Basal cell carcinoma of skin of other parts of face: Secondary | ICD-10-CM | POA: Diagnosis not present

## 2023-05-22 DIAGNOSIS — M1710 Unilateral primary osteoarthritis, unspecified knee: Secondary | ICD-10-CM | POA: Diagnosis not present

## 2023-05-22 DIAGNOSIS — D0439 Carcinoma in situ of skin of other parts of face: Secondary | ICD-10-CM

## 2023-05-22 DIAGNOSIS — D485 Neoplasm of uncertain behavior of skin: Secondary | ICD-10-CM

## 2023-05-22 DIAGNOSIS — C4490 Unspecified malignant neoplasm of skin, unspecified: Secondary | ICD-10-CM | POA: Diagnosis not present

## 2023-05-22 MED ORDER — BLUE-EMU MAXIMUM PAIN RELIEF 10 % EX CREA: 1.0000 | TOPICAL_CREAM | Freq: Two times a day (BID) | CUTANEOUS | 2 refills | Status: DC | PRN

## 2023-05-22 MED ORDER — MELOXICAM 7.5 MG PO TABS: 7.5000 mg | ORAL_TABLET | Freq: Every day | ORAL | 2 refills | Status: DC | PRN

## 2023-05-22 NOTE — Progress Notes (Signed)
Subjective:  [name], female    DOB: [birth year]  Age: 89 y.o. MRN: [id number]  CC: No chief complaint on file.   HPI Sophia Santos presents for nodular skin lesions on the chin.  We were asked to perform skin biopsy prior to her referral to skin surgery center.  The patient is complaining of osteoarthritis in the knees, asking for treatment  Outpatient Medications Prior to Visit  Medication Sig Dispense Refill   acetaminophen (TYLENOL) 325 MG tablet Take 352 mg by mouth every 6 (six) hours as needed for mild pain or moderate pain.     acyclovir (ZOVIRAX) 400 MG tablet Take 1 tablet (400 mg total) by mouth 3 (three) times daily. 21 tablet 3   ALPRAZolam (XANAX) 1 MG tablet Take 1 tablet (1 mg total) by mouth at bedtime as needed for anxiety. 90 tablet 1   amLODipine (NORVASC) 5 MG tablet TAKE 1 TABLET (5 MG TOTAL) BY MOUTH DAILY. 90 tablet 3   captopril (CAPOTEN) 50 MG tablet TAKE 1 TABLET BY MOUTH TWICE A DAY 180 tablet 1   cephALEXin (KEFLEX) 500 MG capsule Take 1 capsule (500 mg total) by mouth 4 (four) times daily. 12 capsule 3   Cholecalciferol 1000 UNITS tablet Take 1,000 Units by mouth daily. Vitamin d     Coenzyme Q10 (CO Q-10) 100 MG CAPS Take by mouth daily.     ezetimibe (ZETIA) 10 MG tablet Take 1 tablet (10 mg total) by mouth daily. 90 tablet 3   omeprazole (PRILOSEC) 20 MG capsule Take 1 capsule (20 mg total) by mouth 2 (two) times daily before a meal. 180 capsule 1   Pancrelipase, Lip-Prot-Amyl, (CREON) 24000-76000 units CPEP TAKE ONE CAPSULE BY MOUTH 3 TIMES A DAY WITH FOOD (Patient taking differently: as needed. TAKE ONE CAPSULE BY MOUTH 3 TIMES A DAY WITH FOOD) 270 capsule 3   phenazopyridine (PYRIDIUM) 100 MG tablet Take 1 tablet (100 mg total) by mouth 3 (three) times daily as needed for pain. 15 tablet 1   polyethylene glycol powder (GAVILAX) 17 GM/SCOOP powder MIX 1 CAPFUL (17 GRAMS) AS DIRECTED AND DRINK TWICE A DAY AS NEEDED FOR MODERATE CONSTIPATION  510 g 5   propranolol ER (INDERAL LA) 120 MG 24 hr capsule TAKE 1 CAPSULE BY MOUTH EVERY DAY 90 capsule 2   RESTASIS 0.05 % ophthalmic emulsion Place 1 drop into both eyes 2 (two) times daily.      triamcinolone ointment (KENALOG) 0.5 % Apply 1 Application topically 2 (two) times daily. 60 g 1   Vitamin A 2400 MCG (8000 UT) CAPS Take 2,400 mcg by mouth daily.     No facility-administered medications prior to visit.    ROS: Review of Systems  Constitutional:  Positive for fatigue.  Musculoskeletal:  Positive for arthralgias and gait problem.  Skin:  Positive for color change. Negative for wound.    Objective:  There were no vitals taken for this visit.  BP Readings from Last 3 Encounters:  04/27/23 (!) 142/72  04/03/23 (!) 142/78  01/27/23 (!) 140/80    Wt Readings from Last 3 Encounters:  04/27/23 135 lb (61.2 kg)  04/03/23 139 lb 1.6 oz (63.1 kg)  01/27/23 137 lb (62.1 kg)    Physical Exam Constitutional:      Appearance: Normal appearance.  Musculoskeletal:        General: Tenderness present.  Neurological:     Mental Status: She is oriented to person, place, and time.  Gait: Gait abnormal.   Both knees with pain on range of motion Skin lesions on the chin-see below   Procedure Note :     Procedure :  Skin biopsy   Indication: Suspicious lesion(s), probable skin cancer, especially #1   Risks including unsuccessful procedure , bleeding, infection, bruising, scar, a need for another complete procedure and others were explained to the patient in detail as well as the benefits. Informed consent was obtained verbally.  The patient was placed in a decubitus position.  Lesion #1 on the right side of the chin   measuring 21 x 20    mm, nodular   Skin over lesion #1  was prepped with Betadine and alcohol  and anesthetized with 2 cc of 2% lidocaine and epinephrine, using a 27-gauge 1/2 inch needle.  Shave biopsy with a sterile Dermablade was carried out in the usual  fashion with 1-2 mm margin.  The borders were irregular.  Hyfrecator was used to destroy the rest of the lesion potentially left behind and for hemostasis. Band-Aid was applied with antibiotic ointment.    Lesion #2 on the left chin measuring 9 x 7 mm   Skin over lesion #1  was prepped with Betadine and alcohol  and anesthetized with 1cc of 2% lidocaine and epinephrine, using a 27-gauge 1/2 inch needle.  Shave biopsy with a sterile Dermablade was carried out in the usual fashion with 1-2 mm margin.  The borders were regular.  Hyfrecator was used to destroy the rest of the lesion potentially left behind and for hemostasis. Band-Aid was applied with antibiotic ointment.    Tolerated well. Complications none.      Postprocedure instructions :    A Band-Aid should be  changed once or twice daily. You can take a shower tomorrow.  Keep the wounds clean. You can wash them with liquid soap and water. Pat dry with gauze or a Kleenex tissue before applying antibiotic ointment and a Band-Aid.   You need to report immediately  if fever, chills or any signs of infection develop.    The biopsy results should be available in 1 -2 weeks.  Lab Results  Component Value Date   WBC 6.0 04/27/2023   HGB 12.7 04/27/2023   HCT 37.6 04/27/2023   PLT 220.0 04/27/2023   GLUCOSE 99 04/27/2023   CHOL 238 (H) 05/06/2021   TRIG 106.0 05/06/2021   HDL 65.10 05/06/2021   LDLDIRECT 143.0 03/02/2018   LDLCALC 152 (H) 05/06/2021   ALT 18 04/27/2023   AST 18 04/27/2023   NA 135 04/27/2023   K 3.6 04/27/2023   CL 100 04/27/2023   CREATININE 0.76 04/27/2023   BUN 21 04/27/2023   CO2 27 04/27/2023   TSH 1.39 05/06/2021   INR 1.0 02/23/2020   HGBA1C 5.9 09/29/2007    No results found.  Assessment & Plan:   Problem List Items Addressed This Visit     Skin cancer - Primary    Chin.  See procedure report      Relevant Orders   Surgical pathology   Surgical pathology   Neoplasm of uncertain behavior  of skin    Chin.  See procedure report      Arthritis of knee    Worse.  Osteoarthritis B.  Meloxicam prescribed.  Use with caution as needed Blue emu cream twice a day as needed      Relevant Medications   meloxicam (MOBIC) 7.5 MG tablet      Meds  ordered this encounter  Medications   meloxicam (MOBIC) 7.5 MG tablet    Sig: Take 1 tablet (7.5 mg total) by mouth daily as needed for pain.    Dispense:  30 tablet    Refill:  2   trolamine salicylate (BLUE-EMU MAXIMUM PAIN RELIEF) 10 % cream    Sig: Apply 1 Application topically 2 (two) times daily as needed for muscle pain (pain).    Dispense:  226 g    Refill:  2      Follow-up: Return in about 3 months (around 08/22/2023) for a follow-up visit.  Sonda Primes, MD

## 2023-05-22 NOTE — Assessment & Plan Note (Signed)
Chin.  See procedure report

## 2023-05-22 NOTE — Assessment & Plan Note (Addendum)
Worse.  Osteoarthritis B.  Meloxicam prescribed.  Use with caution as needed Blue emu cream twice a day as needed

## 2023-05-22 NOTE — Patient Instructions (Signed)
Postprocedure instructions :    A Band-Aid should be  changed once or twice daily. You can take a shower tomorrow.  Keep the wounds clean. You can wash them with liquid soap and water. Pat dry with gauze or a Kleenex tissue before applying antibiotic ointment and a Band-Aid.   You need to report immediately  if fever, chills or any signs of infection develop.    The biopsy results should be available in 1 -2 weeks.   

## 2023-05-25 NOTE — Telephone Encounter (Signed)
Pharmacy comment: Script Clarification:WE DO NOT CARRY THIS.

## 2023-05-28 LAB — SURGICAL PATHOLOGY

## 2023-06-02 NOTE — Telephone Encounter (Signed)
Faxed over office notes & demographic. We do not have pictures. [facility]axed to [facility] @ [phone] .Marland KitchenRaechel Chute

## 2023-06-24 DIAGNOSIS — C44329 Squamous cell carcinoma of skin of other parts of face: Secondary | ICD-10-CM | POA: Diagnosis not present

## 2023-06-24 DIAGNOSIS — C44319 Basal cell carcinoma of skin of other parts of face: Secondary | ICD-10-CM | POA: Diagnosis not present

## 2023-06-29 ENCOUNTER — Telehealth: Payer: Self-pay | Admitting: Internal Medicine

## 2023-06-29 NOTE — Telephone Encounter (Signed)
Dr.Martin with the skin surgery center called and states she has some questions for Dr.Plotnikov regarding the biopsy sites. She states she can be reached via her cell anytime this week, [phone].

## 2023-07-07 NOTE — Telephone Encounter (Signed)
Addressed.  Thank you 

## 2023-07-12 NOTE — Progress Notes (Signed)
Radiation Oncology         [phone] ________________________________  Name: Sophia Santos MRN: [id number]  Date: 07/13/2023  DOB: 05-31-1934  Follow-Up Visit Note  CC: Plotnikov, [name], MD  Plotnikov, [name], MD  No diagnosis found.  Diagnosis: Stage IIIA grade 2 endometrial cancer; endometrial adenocarcinoma     Patient is [name] and speaks [place].  An interpreter is present to help with communication issues.   Interval Since Last Radiation: 1 year, 6 months, and 25 days   Intent: Curative  Radiation Treatment Dates: 11/20/2021 through 12/18/2021 Site Technique Total Dose (Gy) Dose per Fx (Gy) Completed Fx Beam Energies  Vagina: Pelvis HDR-brachy 30/30 6 5/5 Ir-192   Narrative:  The patient returns today for routine follow-up. She was last seen here for follow-up on 01/02/23.   Since her last visit, the patient followed up with Dr. Pricilla Holm on 04/03/23. During which time, the patient endorsed intermittent pelvic pain and constipation. She also reported no change in baseline urinary frequency and incontinence, as well as some worsening issues with ambulation (now requires a cane or walker at all times). She otherwise denied any symptoms concerning for disease recurrence and she was noted as NED on examination.    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 (Followed by Dr. Posey Rea).              ***             Allergies:  is allergic to fosamax [alendronate sodium], lipitor [atorvastatin], voltaren [diclofenac sodium], and zocor [simvastatin].  Meds: Current Outpatient Medications  Medication Sig Dispense Refill   acetaminophen (TYLENOL) 325 MG tablet Take 352 mg by mouth every 6 (six) hours as needed for mild pain or moderate pain.     acyclovir (ZOVIRAX) 400 MG tablet Take 1 tablet (400 mg total) by mouth 3 (three) times daily. 21 tablet 3    ALPRAZolam (XANAX) 1 MG tablet Take 1 tablet (1 mg total) by mouth at bedtime as needed for anxiety. 90 tablet 1   amLODipine (NORVASC) 5 MG tablet TAKE 1 TABLET (5 MG TOTAL) BY MOUTH DAILY. 90 tablet 3   captopril (CAPOTEN) 50 MG tablet TAKE 1 TABLET BY MOUTH TWICE A DAY 180 tablet 1   cephALEXin (KEFLEX) 500 MG capsule Take 1 capsule (500 mg total) by mouth 4 (four) times daily. 12 capsule 3   Cholecalciferol 1000 UNITS tablet Take 1,000 Units by mouth daily. Vitamin d     Coenzyme Q10 (CO Q-10) 100 MG CAPS Take by mouth daily.     ezetimibe (ZETIA) 10 MG tablet Take 1 tablet (10 mg total) by mouth daily. 90 tablet 3   meloxicam (MOBIC) 7.5 MG tablet Take 1 tablet (7.5 mg total) by mouth daily as needed for pain. 30 tablet 2   omeprazole (PRILOSEC) 20 MG capsule Take 1 capsule (20 mg total) by mouth 2 (two) times daily before a meal. 180 capsule 1   Pancrelipase, Lip-Prot-Amyl, (CREON) 24000-76000 units CPEP TAKE ONE CAPSULE BY MOUTH 3 TIMES A DAY WITH FOOD (Patient taking differently: as needed. TAKE ONE CAPSULE BY MOUTH 3 TIMES A DAY WITH FOOD) 270 capsule 3   phenazopyridine (PYRIDIUM) 100 MG tablet Take 1 tablet (100 mg total) by mouth 3 (three) times daily as needed for pain. 15 tablet 1   polyethylene glycol powder (GAVILAX) 17 GM/SCOOP powder MIX 1  CAPFUL (17 GRAMS) AS DIRECTED AND DRINK TWICE A DAY AS NEEDED FOR MODERATE CONSTIPATION 510 g 5   propranolol ER (INDERAL LA) 120 MG 24 hr capsule TAKE 1 CAPSULE BY MOUTH EVERY DAY 90 capsule 2   RESTASIS 0.05 % ophthalmic emulsion Place 1 drop into both eyes 2 (two) times daily.      triamcinolone ointment (KENALOG) 0.5 % Apply 1 Application topically 2 (two) times daily. 60 g 1   Trolamine LIQD Use Blue Emu on sore joints 3 times a day. 226 mL 2   Vitamin A 2400 MCG (8000 UT) CAPS Take 2,400 mcg by mouth daily.     No current facility-administered medications for this encounter.    Physical Findings: The patient is in no acute distress.  Patient is alert and oriented.  vitals were not taken for this visit. .  No significant changes. Lungs are clear to auscultation bilaterally. Heart has regular rate and rhythm. No palpable cervical, supraclavicular, or axillary adenopathy. Abdomen soft, non-tender, normal bowel sounds.  On pelvic examination the external genitalia were unremarkable. A speculum exam was performed. There are no mucosal lesions noted in the vaginal vault. A Pap smear was obtained of the proximal vagina. On bimanual and rectovaginal examination there were no pelvic masses appreciated. ***    Lab Findings: Lab Results  Component Value Date   WBC 6.0 04/27/2023   HGB 12.7 04/27/2023   HCT 37.6 04/27/2023   MCV 94.4 04/27/2023   PLT 220.0 04/27/2023    Radiographic Findings: No results found.  Impression: Stage IIIA grade 2 endometrial cancer; endometrial adenocarcinoma   The patient is recovering from the effects of radiation.  ***  Plan:  ***   *** minutes of total time was spent for this patient encounter, including preparation, face-to-face counseling with the patient and coordination of care, physical exam, and documentation of the encounter. ____________________________________  Billie Lade, PhD, MD  This document serves as a record of services personally performed by Antony Blackbird, MD. It was created on his behalf by Neena Rhymes, a trained medical scribe. The creation of this record is based on the scribe's personal observations and the provider's statements to them. This document has been checked and approved by the attending provider.

## 2023-07-13 ENCOUNTER — Ambulatory Visit
Admission: RE | Admit: 2023-07-13 | Discharge: 2023-07-13 | Disposition: A | Discharge status: Home / Self Care | Payer: 59 | Source: Ambulatory Visit | Attending: Radiation Oncology | Admitting: Radiation Oncology

## 2023-07-13 ENCOUNTER — Other Ambulatory Visit: Payer: Self-pay

## 2023-07-13 VITALS — BP 175/90 | HR 72 | Temp 97.3°F | Resp 18 | Ht 60.0 in | Wt 138.1 lb

## 2023-07-13 DIAGNOSIS — Z923 Personal history of irradiation: Secondary | ICD-10-CM | POA: Insufficient documentation

## 2023-07-13 DIAGNOSIS — C541 Malignant neoplasm of endometrium: Secondary | ICD-10-CM

## 2023-07-13 DIAGNOSIS — Z79899 Other long term (current) drug therapy: Secondary | ICD-10-CM | POA: Diagnosis not present

## 2023-07-13 DIAGNOSIS — Z8542 Personal history of malignant neoplasm of other parts of uterus: Secondary | ICD-10-CM | POA: Diagnosis not present

## 2023-07-13 NOTE — Progress Notes (Signed)
Sophia Santos is here today for follow up post radiation to the pelvic.  They completed their radiation on: 12/18/21   Does the patient complain of any of the following:  Pain:Denies  Abdominal bloating: No Diarrhea/Constipation: She has some constipation, she is taking miralax daily. Nausea/Vomiting: No Vaginal Discharge: No Blood in Urine or Stool: Denies. Urinary Issues (dysuria/incomplete emptying/ incontinence/ increased frequency/urgency): She reports some incontinence at night.   Does patient report using vaginal dilator 2-3 times a week and/or sexually active 2-3 weeks: She states she was using the dilators as directed until about 3 months ago. Post radiation skin changes:    Additional comments if applicable:

## 2023-07-22 ENCOUNTER — Telehealth: Payer: Self-pay | Admitting: Internal Medicine

## 2023-07-22 DIAGNOSIS — C44319 Basal cell carcinoma of skin of other parts of face: Secondary | ICD-10-CM | POA: Diagnosis not present

## 2023-07-22 DIAGNOSIS — C44329 Squamous cell carcinoma of skin of other parts of face: Secondary | ICD-10-CM | POA: Diagnosis not present

## 2023-07-22 NOTE — Telephone Encounter (Signed)
Surgical clearance received from [facility]. They need surgical clearance form completed and faxed back with recent EKG to [phone]. Placed in provider's box up front.

## 2023-07-22 NOTE — Telephone Encounter (Signed)
Place form in MD purple folder for signature.Marland KitchenJohny Santos

## 2023-07-23 DIAGNOSIS — C44319 Basal cell carcinoma of skin of other parts of face: Secondary | ICD-10-CM | POA: Insufficient documentation

## 2023-07-25 ENCOUNTER — Other Ambulatory Visit: Payer: Self-pay | Admitting: Internal Medicine

## 2023-07-26 NOTE — Telephone Encounter (Signed)
Okay. Thank you.

## 2023-07-27 NOTE — Telephone Encounter (Signed)
Faxed form back to [facility]...Raechel Chute

## 2023-07-29 DIAGNOSIS — C44319 Basal cell carcinoma of skin of other parts of face: Secondary | ICD-10-CM | POA: Diagnosis not present

## 2023-07-29 DIAGNOSIS — C44329 Squamous cell carcinoma of skin of other parts of face: Secondary | ICD-10-CM | POA: Diagnosis not present

## 2023-07-30 DIAGNOSIS — Z01810 Encounter for preprocedural cardiovascular examination: Secondary | ICD-10-CM | POA: Diagnosis not present

## 2023-08-03 DIAGNOSIS — C44319 Basal cell carcinoma of skin of other parts of face: Secondary | ICD-10-CM | POA: Diagnosis not present

## 2023-08-24 ENCOUNTER — Other Ambulatory Visit: Payer: Self-pay | Admitting: Internal Medicine

## 2023-09-14 DIAGNOSIS — D049 Carcinoma in situ of skin, unspecified: Secondary | ICD-10-CM | POA: Diagnosis not present

## 2023-09-14 DIAGNOSIS — L578 Other skin changes due to chronic exposure to nonionizing radiation: Secondary | ICD-10-CM | POA: Diagnosis not present

## 2023-09-14 DIAGNOSIS — D0439 Carcinoma in situ of skin of other parts of face: Secondary | ICD-10-CM | POA: Diagnosis not present

## 2023-10-02 ENCOUNTER — Encounter: Payer: Self-pay | Admitting: Gynecologic Oncology

## 2023-10-02 ENCOUNTER — Inpatient Hospital Stay: Payer: 59 | Attending: Gynecologic Oncology | Admitting: Gynecologic Oncology

## 2023-10-02 VITALS — BP 136/79 | HR 66 | Temp 97.9°F | Resp 19 | Wt 140.8 lb

## 2023-10-02 DIAGNOSIS — Z8542 Personal history of malignant neoplasm of other parts of uterus: Secondary | ICD-10-CM | POA: Insufficient documentation

## 2023-10-02 DIAGNOSIS — Z9221 Personal history of antineoplastic chemotherapy: Secondary | ICD-10-CM | POA: Diagnosis not present

## 2023-10-02 DIAGNOSIS — Z923 Personal history of irradiation: Secondary | ICD-10-CM | POA: Insufficient documentation

## 2023-10-02 DIAGNOSIS — R102 Pelvic and perineal pain: Secondary | ICD-10-CM | POA: Diagnosis not present

## 2023-10-02 DIAGNOSIS — C541 Malignant neoplasm of endometrium: Secondary | ICD-10-CM

## 2023-10-02 NOTE — Progress Notes (Signed)
Gynecologic Oncology Return Clinic Visit  10/02/23  Reason for Visit: Surveillance visit the setting of endometrial cancer history    Treatment History: Oncology History Overview Note  Endometrioid FIGO grade 1-2, MSI high   Endometrial adenocarcinoma (HCC)  07/19/2021 Imaging   US pelvis 1. Endometrial thickness of 5.6 mm. In the setting of post-menopausal bleeding, endometrial sampling is indicated to exclude carcinoma.  2. Densely calcified shadowing mass within the right uterus presumably representing a calcified fibroid.   07/31/2021 Pathology Results   SURGICAL PATHOLOGY   FINAL MICROSCOPIC DIAGNOSIS:   A. ENDOMETRIUM, BIOPSY:  - Endometrial adenocarcinoma, FIGO grade 1.  See comment  SUMMARY INTERPRETATION: ABNORMAL   There is loss of the major and minor MMR proteins MLH1 and PMS2. The loss of expression may be secondary to promoter hyper-methylation, gene mutation or other genetic event. BRAF mutation testing and/or MLH1 methylation testing is indicated. The presence of a BRAF mutation and/or MLH1 hypermethylation is indicative of a sporadic-type tumor. The absence of either BRAF mutation and/or presence of normal methylation indicate the possible presence of a hereditary germline mutation (e.g. Lynch syndrome) and referral to genetic counseling is warranted.    08/05/2021 Initial Diagnosis   Endometrial adenocarcinoma (HCC)   08/21/2021 Pathology Results   FINAL MICROSCOPIC DIAGNOSIS:   A. LYMPH NODE, SENTINEL, RIGHT OBTURATOR, EXCISION:  - Lymph node, negative for carcinoma (0/1)   B. LYMPH NODE, SENTINEL, LEFT OBTURATOR, EXCISION:  - Lymph node, negative for carcinoma (0/1)   C. UTERUS, CERVIX, BILATERAL FALLOPIAN TUBES AND OVARIES:  - Endometrioid adenocarcinoma, FIGO grade 2  - Carcinoma invades through the entire myometrium and focally involves  the serosal surface  - Benign unremarkable cervix  - Benign bilateral fallopian tubes and ovaries  - See oncology table   - See comment   COMMENT:   C.  Tumor showed areas with clear cell morphology but immunostains for Napsin A and P504S do not show evidence of clear cell carcinoma.  Also, there is significant tumor necrosis, but the viable portion of tumor appears to be FIGO grade 2.  Dr. Berneice Heinrich reviewed the select slides from the case and concurs with presence of serosal invasion.    ONCOLOGY TABLE:   UTERUS, CARCINOMA OR CARCINOSARCOMA: Resection   Procedure: Total hysterectomy and bilateral salpingo-oophorectomy  Histologic Type: Endometrioid adenocarcinoma  Histologic Grade: FIGO grade 1  Myometrial Invasion:       Depth of Myometrial Invasion (mm): 17 mm       Myometrial Thickness (mm): 17 mm       Percentage of Myometrial Invasion: 100%  Uterine Serosa Involvement: Present, focal  Cervical stromal Involvement: Not identified  Extent of involvement of other tissue/organs: Not identified  Peritoneal/Ascitic Fluid: Not applicable  Lymphovascular Invasion: Not identified  Regional Lymph Nodes:       Pelvic Lymph Nodes Examined:                                   2 Sentinel                                   0 Non-sentinel                                   2 Total  Pelvic Lymph Nodes with Metastasis: 0       Para-aortic Lymph Nodes Examined:                                    0 Sentinel                                    0 Non-sentinel                                    0 Total       Para-aortic Lymph Nodes with Metastasis: 0  Distant Metastasis:       Distant Site(s) Involved: Not applicable  Pathologic Stage Classification (pTNM, AJCC 8th Edition): pT3a, pN0  Ancillary Studies: MMR / MSI testing will be ordered  Representative Tumor Block: C10  Comment(s): Pancytokeratin was performed on the lymph nodes and is negative.    08/21/2021 Surgery   Surgeon: Quinn Axe    Operation: Robotic-assisted laparoscopic total hysterectomy with bilateral salpingoophorectomy, SLN biopsy      Operative Findings:  : 6cm uterus with anterior fibroid (approx 4cm). Normal appearing tubes and ovaries. No suspicious nodes. Bilateral mapping.     10/03/2021 Cancer Staging   Staging form: Corpus Uteri - Carcinoma and Carcinosarcoma, AJCC 8th Edition - Clinical stage from 10/03/2021: FIGO Stage III (cT3, cN0, cM0) - Signed by Artis Delay, MD on 10/03/2021 Stage prefix: Initial diagnosis   10/14/2021 - 01/20/2022 Chemotherapy   Patient is on Treatment Plan : Uterine Carboplatin (AUC 5) 21d      11/20/2021 - 12/18/2021 Radiation Therapy   11/20/2021 through 12/18/2021 Site Technique Total Dose (Gy) Dose per Fx (Gy) Completed Fx Beam Energies  Vagina: Pelvis HDR-brachy 30/30 6 5/5 Ir-192      03/21/2022 Imaging   No evidence of recurrent or metastatic carcinoma within the abdomen or pelvis.   Colonic diverticulosis, without radiographic evidence of diverticulitis.   Stable small benign left adrenal adenoma.   Aortic Atherosclerosis (ICD10-I70.0).     Interval History: Doing well.  Denies any vaginal bleeding or discharge.  Reports normal bowel bladder function although has some increased nocturia.  Continues to have some intermittent pelvic pain, at baseline since prior to her surgery.  Denies any recent changes.  Past Medical/Surgical History: Past Medical History:  Diagnosis Date   Arthritis    Complication of anesthesia    "years ago, hard to awaken, no with recent procedures."   Diverticulosis    Endometrial cancer (HCC)    Family history of colon cancer 10/15/2021   Gallstones    GERD (gastroesophageal reflux disease)    Headache 08/15/2021   none recent   History of radiation therapy    Endometrium- vcc brachytherapy 11/20/21-12/18/21- Dr. Antony Blackbird   HTN (hypertension)    Hyperlipidemia    Irritable bowel syndrome (IBS)    Osteoarthritis    Osteopenia    Osteoporosis    PMB (postmenopausal bleeding) 08/15/2021   Skin cancer    skin cancer -    Urinary  frequency 08/15/2021   Wears glasses 08/15/2021    Past Surgical History:  Procedure Laterality Date   bilateral cataracts     COLONOSCOPY     INGUINAL HERNIA REPAIR Bilateral 09/13/2020   Procedure: LAPAROSCOPIC BILATERAL  INGUINAL HERNIA REPAIR WITH MESH;  Surgeon: Axel Filler, MD;  Location: [facility] OR;  Service: General;  Laterality: Bilateral;   ROBOTIC ASSISTED TOTAL HYSTERECTOMY WITH BILATERAL SALPINGO OOPHERECTOMY Bilateral 08/21/2021   Procedure: XI ROBOTIC ASSISTED TOTAL HYSTERECTOMY WITH BILATERAL SALPINGO OOPHORECTOMY;  Surgeon: Adolphus Birchwood, MD;  Location: [facility] [place];  Service: Gynecology;  Laterality: Bilateral;   SENTINEL NODE BIOPSY N/A 08/21/2021   Procedure: SENTINEL NODE BIOPSY;  Surgeon: Adolphus Birchwood, MD;  Location: [facility];  Service: Gynecology;  Laterality: N/A;   uterine prolapse surgery      Family History  Problem Relation Age of Onset   Colon cancer Mother 41   Stroke Mother    Parkinson's disease Father    Dementia Father    Colon cancer Sister 40   Colon polyps Sister    Cancer Maternal Aunt        unknown type   Cancer Paternal Aunt        unknown type; ?intestinal   Coronary artery disease Other        female 1st degree <50   Cancer Cousin        maternal female cousins; x3; unknown type   Breast cancer Cousin        maternal female cousin   Esophageal cancer Neg Hx    Stomach cancer Neg Hx    Anesthesia problems Neg Hx    Endometrial cancer Neg Hx    Pancreatic cancer Neg Hx    Prostate cancer Neg Hx     Social History   Socioeconomic History   Marital status: Widowed    Spouse name: Not on file   Number of children: 1   Years of education: Not on file   Highest education level: Not on file  Occupational History   Occupation: retired  Tobacco Use   Smoking status: Never   Smokeless tobacco: Never  Vaping Use   Vaping status: Never Used  Substance and Sexual Activity   Alcohol use: No   Drug use: No    Sexual activity: Not Currently    Birth control/protection: None  Other Topics Concern   Not on file  Social History Narrative   One Boy    Daily Caffeine - tea         Social Determinants of Health   Financial Resource Strain: Low Risk  (09/24/2022)   Overall Financial Resource Strain (CARDIA)    Difficulty of Paying Living Expenses: Not hard at all  Food Insecurity: No Food Insecurity (09/24/2022)   Hunger Vital Sign    Worried About Running Out of Food in the Last Year: Never true    Ran Out of Food in the Last Year: Never true  Transportation Needs: No Transportation Needs (09/24/2022)   PRAPARE - Administrator, Civil Service (Medical): No    Lack of Transportation (Non-Medical): No  Physical Activity: Inactive (09/24/2022)   Exercise Vital Sign    Days of Exercise per Week: 0 days    Minutes of Exercise per Session: 0 min  Stress: No Stress Concern Present (09/24/2022)   [employer] of Occupational Health - Occupational Stress Questionnaire    Feeling of Stress : Not at all  Social Connections: Moderately Integrated (09/24/2022)   Social Connection and Isolation Panel [NHANES]    Frequency of Communication with Friends and Family: More than three times a week    Frequency of Social Gatherings with Friends and Family: More than three times a  week    Attends Religious Services: More than 4 times per year    Active Member of Clubs or Organizations: Yes    Attends Banker Meetings: More than 4 times per year    Marital Status: Widowed    Current Medications:  Current Outpatient Medications:    acetaminophen (TYLENOL) 325 MG tablet, Take 352 mg by mouth every 6 (six) hours as needed for mild pain or moderate pain., Disp: , Rfl:    acyclovir (ZOVIRAX) 400 MG tablet, Take 1 tablet (400 mg total) by mouth 3 (three) times daily., Disp: 21 tablet, Rfl: 3   ALPRAZolam (XANAX) 1 MG tablet, Take 1 tablet (1 mg total) by mouth at bedtime as needed  for anxiety., Disp: 90 tablet, Rfl: 1   amLODipine (NORVASC) 5 MG tablet, TAKE 1 TABLET (5 MG TOTAL) BY MOUTH DAILY., Disp: 90 tablet, Rfl: 3   captopril (CAPOTEN) 50 MG tablet, TAKE 1 TABLET BY MOUTH TWICE A DAY, Disp: 180 tablet, Rfl: 1   cephALEXin (KEFLEX) 500 MG capsule, TAKE 1 CAPSULE (500 MG TOTAL) BY MOUTH DAILY, Disp: 90 capsule, Rfl: 1   Cholecalciferol 1000 UNITS tablet, Take 1,000 Units by mouth daily. Vitamin d, Disp: , Rfl:    Coenzyme Q10 (CO Q-10) 100 MG CAPS, Take by mouth daily., Disp: , Rfl:    ezetimibe (ZETIA) 10 MG tablet, Take 1 tablet (10 mg total) by mouth daily., Disp: 90 tablet, Rfl: 3   meloxicam (MOBIC) 7.5 MG tablet, Take 1 tablet (7.5 mg total) by mouth daily as needed for pain., Disp: 30 tablet, Rfl: 2   omeprazole (PRILOSEC) 20 MG capsule, Take 1 capsule (20 mg total) by mouth 2 (two) times daily before a meal., Disp: 180 capsule, Rfl: 1   Pancrelipase, Lip-Prot-Amyl, (CREON) 24000-76000 units CPEP, TAKE ONE CAPSULE BY MOUTH 3 TIMES A DAY WITH FOOD (Patient taking differently: as needed. TAKE ONE CAPSULE BY MOUTH 3 TIMES A DAY WITH FOOD), Disp: 270 capsule, Rfl: 3   phenazopyridine (PYRIDIUM) 100 MG tablet, Take 1 tablet (100 mg total) by mouth 3 (three) times daily as needed for pain., Disp: 15 tablet, Rfl: 1   polyethylene glycol powder (GAVILAX) 17 GM/SCOOP powder, MIX 1 CAPFUL (17 GRAMS) AS DIRECTED AND DRINK TWICE A DAY AS NEEDED FOR MODERATE CONSTIPATION, Disp: 510 g, Rfl: 5   propranolol ER (INDERAL LA) 120 MG 24 hr capsule, TAKE 1 CAPSULE BY MOUTH EVERY DAY, Disp: 90 capsule, Rfl: 2   RESTASIS 0.05 % ophthalmic emulsion, Place 1 drop into both eyes 2 (two) times daily. , Disp: , Rfl:    triamcinolone ointment (KENALOG) 0.5 %, Apply 1 Application topically 2 (two) times daily., Disp: 60 g, Rfl: 1   Trolamine LIQD, Use Blue Emu on sore joints 3 times a day., Disp: 226 mL, Rfl: 2   Vitamin A 2400 MCG (8000 UT) CAPS, Take 2,400 mcg by mouth daily., Disp: , Rfl:    Review of Systems: Denies appetite changes, fevers, chills, fatigue, unexplained weight changes. Denies hearing loss, neck lumps or masses, mouth sores, ringing in ears or voice changes. Denies cough or wheezing.  Denies shortness of breath. Denies chest pain or palpitations. Denies leg swelling. Denies abdominal distention, pain, blood in stools, constipation, diarrhea, nausea, vomiting, or early satiety. Denies pain with intercourse, dysuria, frequency, hematuria or incontinence. Denies hot flashes, pelvic pain, vaginal bleeding or vaginal discharge.   Denies joint pain, back pain or muscle pain/cramps. Denies itching, rash, or wounds. Denies dizziness,  headaches, numbness or seizures. Denies swollen lymph nodes or glands, denies easy bruising or bleeding. Denies anxiety, depression, confusion, or decreased concentration.  Physical Exam: BP 136/79 (BP Location: Left Arm, Patient Position: Sitting)   Pulse 66   Temp 97.9 F (36.6 C) (Oral)   Resp 19   Wt 140 lb 12.8 oz (63.9 kg)   SpO2 97%   BMI 27.50 kg/m  General: Alert, oriented, no acute distress. HEENT: Normocephalic, atraumatic, sclera anicteric. Chest: Clear to auscultation bilaterally.  No wheezes or rhonchi. Cardiovascular: Regular rate and rhythm, no murmurs. Abdomen: soft, nontender.  Normoactive bowel sounds.  No masses or hepatosplenomegaly appreciated.  Well-healed incisions. Extremities: Grossly normal range of motion.  Warm, well perfused.  No edema bilaterally. Skin: No rashes or lesions noted. Lymphatics: No cervical, supraclavicular, or inguinal adenopathy. GU: Normal appearing external genitalia without erythema, excoriation, or lesions.  Speculum exam reveals mildly atrophic vaginal mucosa as well as some apical prolapse.  On vaginal exam, some fullness noted at the cuff that on rectovaginal exam feels most like redundant colon although it is different than her prior exam.  No nodularity or true masses  palpated.   Laboratory & Radiologic Studies: None new  Assessment & Plan: Sophia Santos is a 89 y.o. 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  Completed adjuvant chemotherapy and VBT in 01/2022. Post-treatment imaging in 03/2022 negative for evidence of disease.   Patient is doing well.  I suspect exam findings are related to redundant colon sitting superior to her vaginal cuff.  Given her high risk cancer history as well as pelvic pain, recommended that we get a CT scan today.  I will let her know when these results are back.   Per NCCN surveillance recommendations, we discussed continued surveillance visits every 3 months.  Patient is scheduled to see Dr. Roselind Messier in late January.  I will see her back in April.  We reviewed signs and symptoms that would be concerning for cancer recurrence and I stressed the importance of calling if she develops any of these.    20 minutes of total time was spent for this patient encounter, including preparation, face-to-face counseling with the patient and coordination of care, and documentation of the encounter.  Eugene Garnet, MD  Division of Gynecologic Oncology  Department of Obstetrics and Gynecology  [facility]

## 2023-10-02 NOTE — Patient Instructions (Addendum)
It was good to see you today.   On your exam to date, I feel what I think is just your colon folded over on itself at the top of the vagina.  But because of your pelvic pain, we will get a CT scan to make sure that there is nothing going on related to your cancer history.  I will see you for follow-up in 6 months.  As always, if you develop any new and concerning symptoms before your next visit, please call to see me sooner.

## 2023-10-16 ENCOUNTER — Ambulatory Visit (HOSPITAL_COMMUNITY)
Admission: RE | Admit: 2023-10-16 | Discharge: 2023-10-16 | Disposition: A | Discharge status: Home / Self Care | Payer: 59 | Source: Ambulatory Visit | Attending: Gynecologic Oncology | Admitting: Gynecologic Oncology

## 2023-10-16 DIAGNOSIS — K449 Diaphragmatic hernia without obstruction or gangrene: Secondary | ICD-10-CM | POA: Diagnosis not present

## 2023-10-16 DIAGNOSIS — K8020 Calculus of gallbladder without cholecystitis without obstruction: Secondary | ICD-10-CM | POA: Diagnosis not present

## 2023-10-16 DIAGNOSIS — C541 Malignant neoplasm of endometrium: Secondary | ICD-10-CM | POA: Insufficient documentation

## 2023-10-16 DIAGNOSIS — K5730 Diverticulosis of large intestine without perforation or abscess without bleeding: Secondary | ICD-10-CM | POA: Diagnosis not present

## 2023-10-16 MED ORDER — IOHEXOL 9 MG/ML PO SOLN
1000.0000 mL | ORAL | Status: AC
  Administered 2023-10-16: 1000 mL via ORAL

## 2023-10-16 MED ORDER — IOHEXOL 300 MG/ML  SOLN
100.0000 mL | Freq: Once | INTRAMUSCULAR | Status: AC | PRN
  Administered 2023-10-16: 100 mL via INTRAVENOUS

## 2023-10-16 MED ORDER — IOHEXOL 9 MG/ML PO SOLN
ORAL | Status: AC
  Filled 2023-10-16: qty 1000

## 2023-10-19 ENCOUNTER — Other Ambulatory Visit: Payer: Self-pay | Admitting: Internal Medicine

## 2023-10-29 ENCOUNTER — Telehealth: Payer: Self-pay | Admitting: Oncology

## 2023-10-29 NOTE — Progress Notes (Signed)
Would you please let her know that there is no evidence of cancer recurrence on her recent CT? Thank you

## 2023-10-29 NOTE — Telephone Encounter (Signed)
Called Eunice Blase (daughter in law ) and let her know that there is no evidence of cancer on her recent CT.  [name] verbalized understanding and will let Fay know.

## 2023-11-04 DIAGNOSIS — D044 Carcinoma in situ of skin of scalp and neck: Secondary | ICD-10-CM | POA: Diagnosis not present

## 2023-11-04 DIAGNOSIS — D0439 Carcinoma in situ of skin of other parts of face: Secondary | ICD-10-CM | POA: Diagnosis not present

## 2023-11-04 DIAGNOSIS — Z08 Encounter for follow-up examination after completed treatment for malignant neoplasm: Secondary | ICD-10-CM | POA: Diagnosis not present

## 2023-11-04 DIAGNOSIS — Z85828 Personal history of other malignant neoplasm of skin: Secondary | ICD-10-CM | POA: Diagnosis not present

## 2023-11-04 DIAGNOSIS — Z86007 Personal history of in-situ neoplasm of skin: Secondary | ICD-10-CM | POA: Diagnosis not present

## 2023-11-10 ENCOUNTER — Other Ambulatory Visit (INDEPENDENT_AMBULATORY_CARE_PROVIDER_SITE_OTHER): Payer: 59

## 2023-11-10 ENCOUNTER — Encounter: Payer: Self-pay | Admitting: Internal Medicine

## 2023-11-10 ENCOUNTER — Ambulatory Visit: Payer: 59 | Admitting: Internal Medicine

## 2023-11-10 VITALS — BP 130/98 | HR 96 | Temp 98.6°F | Ht 60.0 in | Wt 140.0 lb

## 2023-11-10 DIAGNOSIS — F411 Generalized anxiety disorder: Secondary | ICD-10-CM

## 2023-11-10 DIAGNOSIS — K581 Irritable bowel syndrome with constipation: Secondary | ICD-10-CM

## 2023-11-10 DIAGNOSIS — I10 Essential (primary) hypertension: Secondary | ICD-10-CM

## 2023-11-10 DIAGNOSIS — C541 Malignant neoplasm of endometrium: Secondary | ICD-10-CM

## 2023-11-10 DIAGNOSIS — G8929 Other chronic pain: Secondary | ICD-10-CM | POA: Diagnosis not present

## 2023-11-10 DIAGNOSIS — N3000 Acute cystitis without hematuria: Secondary | ICD-10-CM

## 2023-11-10 DIAGNOSIS — M25562 Pain in left knee: Secondary | ICD-10-CM | POA: Diagnosis not present

## 2023-11-10 LAB — COMPREHENSIVE METABOLIC PANEL
ALT: 18 U/L (ref 0–35)
AST: 19 U/L (ref 0–37)
Albumin: 4.2 g/dL (ref 3.5–5.2)
Alkaline Phosphatase: 96 U/L (ref 39–117)
BUN: 15 mg/dL (ref 6–23)
CO2: 29 meq/L (ref 19–32)
Calcium: 9.3 mg/dL (ref 8.4–10.5)
Chloride: 99 meq/L (ref 96–112)
Creatinine, Ser: 0.68 mg/dL (ref 0.40–1.20)
GFR: 78.18 mL/min (ref >60.00)
Glucose, Bld: 96 mg/dL (ref 70–99)
Potassium: 3.9 meq/L (ref 3.5–5.1)
Sodium: 135 meq/L (ref 135–145)
Total Bilirubin: 0.4 mg/dL (ref 0.2–1.2)
Total Protein: 7 g/dL (ref 6.0–8.3)

## 2023-11-10 LAB — CBC WITH DIFFERENTIAL/PLATELET
Basophils Absolute: 0 10*3/uL (ref 0.0–0.1)
Basophils Relative: 0.6 % (ref 0.0–3.0)
Eosinophils Absolute: 0.1 10*3/uL (ref 0.0–0.7)
Eosinophils Relative: 1.8 % (ref 0.0–5.0)
HCT: 40.2 % (ref 36.0–46.0)
Hemoglobin: 13.2 g/dL (ref 12.0–15.0)
Lymphocytes Relative: 42.3 % (ref 12.0–46.0)
Lymphs Abs: 2.2 10*3/uL (ref 0.7–4.0)
MCHC: 32.9 g/dL (ref 30.0–36.0)
MCV: 96.1 fL (ref 78.0–100.0)
Monocytes Absolute: 0.4 10*3/uL (ref 0.1–1.0)
Monocytes Relative: 8.3 % (ref 3.0–12.0)
Neutro Abs: 2.4 10*3/uL (ref 1.4–7.7)
Neutrophils Relative %: 47 % (ref 43.0–77.0)
Platelets: 218 10*3/uL (ref 150.0–400.0)
RBC: 4.18 Mil/uL (ref 3.87–5.11)
RDW: 14 % (ref 11.5–15.5)
WBC: 5.2 10*3/uL (ref 4.0–10.5)

## 2023-11-10 MED ORDER — CREON 24000-76000 UNITS PO CPEP: ORAL_CAPSULE | ORAL | 3 refills | Status: DC

## 2023-11-10 MED ORDER — DICYCLOMINE HCL 10 MG PO CAPS: 10.0000 mg | ORAL_CAPSULE | Freq: Three times a day (TID) | ORAL | 3 refills | Status: DC

## 2023-11-10 MED ORDER — METHYLPREDNISOLONE ACETATE 40 MG/ML IJ SUSP
40.0000 mg | Freq: Once | INTRAMUSCULAR | Status: AC
  Administered 2023-11-10: 40 mg via INTRAMUSCULAR

## 2023-11-10 MED ORDER — TRAZODONE HCL 50 MG PO TABS: 50.0000 mg | ORAL_TABLET | Freq: Every day | ORAL | 5 refills | Status: DC

## 2023-11-10 NOTE — Progress Notes (Signed)
Subjective:  [name], female    DOB: 1934-04-11  Age: 89 y.o. MRN: [id number]  CC: Follow-up (Pt would like to discuss medications)   HPI Sophia Santos presents for skin cancer on face, anxiety, HTN C/o knee pain  Outpatient Medications Prior to Visit  Medication Sig Dispense Refill   acetaminophen (TYLENOL) 325 MG tablet Take 352 mg by mouth every 6 (six) hours as needed for mild pain or moderate pain.     acyclovir (ZOVIRAX) 400 MG tablet Take 1 tablet (400 mg total) by mouth 3 (three) times daily. 21 tablet 3   ALPRAZolam (XANAX) 1 MG tablet TAKE 1 TABLET BY MOUTH AT BEDTIME AS NEEDED FOR ANXIETY. 90 tablet 1   amLODipine (NORVASC) 5 MG tablet TAKE 1 TABLET (5 MG TOTAL) BY MOUTH DAILY. 90 tablet 3   captopril (CAPOTEN) 50 MG tablet TAKE 1 TABLET BY MOUTH TWICE A DAY 180 tablet 1   cephALEXin (KEFLEX) 500 MG capsule TAKE 1 CAPSULE (500 MG TOTAL) BY MOUTH DAILY 90 capsule 1   Cholecalciferol 1000 UNITS tablet Take 1,000 Units by mouth daily. Vitamin d     Coenzyme Q10 (CO Q-10) 100 MG CAPS Take by mouth daily.     ezetimibe (ZETIA) 10 MG tablet Take 1 tablet (10 mg total) by mouth daily. 90 tablet 3   meloxicam (MOBIC) 7.5 MG tablet Take 1 tablet (7.5 mg total) by mouth daily as needed for pain. 30 tablet 2   omeprazole (PRILOSEC) 20 MG capsule Take 1 capsule (20 mg total) by mouth 2 (two) times daily before a meal. 180 capsule 1   polyethylene glycol powder (GAVILAX) 17 GM/SCOOP powder MIX 1 CAPFUL (17 GRAMS) AS DIRECTED AND DRINK TWICE A DAY AS NEEDED FOR MODERATE CONSTIPATION 510 g 5   propranolol ER (INDERAL LA) 120 MG 24 hr capsule TAKE 1 CAPSULE BY MOUTH EVERY DAY 90 capsule 2   RESTASIS 0.05 % ophthalmic emulsion Place 1 drop into both eyes 2 (two) times daily.      triamcinolone ointment (KENALOG) 0.5 % Apply 1 Application topically 2 (two) times daily. 60 g 1   Trolamine LIQD Use Blue Emu on sore joints 3 times a day. 226 mL 2   Vitamin A 2400 MCG (8000 UT) CAPS  Take 2,400 mcg by mouth daily.     Pancrelipase, Lip-Prot-Amyl, (CREON) 24000-76000 units CPEP TAKE ONE CAPSULE BY MOUTH 3 TIMES A DAY WITH FOOD (Patient taking differently: as needed. TAKE ONE CAPSULE BY MOUTH 3 TIMES A DAY WITH FOOD) 270 capsule 3   phenazopyridine (PYRIDIUM) 100 MG tablet Take 1 tablet (100 mg total) by mouth 3 (three) times daily as needed for pain. 15 tablet 1   No facility-administered medications prior to visit.    ROS: Review of Systems  Constitutional:  Positive for fatigue. Negative for activity change, appetite change, chills and unexpected weight change.  HENT:  Negative for congestion, mouth sores and sinus pressure.   Eyes:  Negative for visual disturbance.  Respiratory:  Negative for cough and chest tightness.   Gastrointestinal:  Negative for abdominal pain and nausea.  Genitourinary:  Positive for frequency. Negative for difficulty urinating, urgency and vaginal pain.  Musculoskeletal:  Negative for back pain and gait problem.  Skin:  Negative for pallor and rash.  Neurological:  Negative for dizziness, tremors, weakness, numbness and headaches.  Psychiatric/Behavioral:  Negative for confusion, sleep disturbance and suicidal ideas.     Objective:  BP (!) 130/98 (BP Location:  Left Arm, Patient Position: Sitting, Cuff Size: Normal)   Pulse 96   Temp 98.6 F (37 C) (Oral)   Ht 5' (1.524 m)   Wt 140 lb (63.5 kg)   SpO2 94%   BMI 27.34 kg/m   BP Readings from Last 3 Encounters:  11/10/23 (!) 130/98  10/02/23 136/79  07/13/23 (!) 175/90    Wt Readings from Last 3 Encounters:  11/10/23 140 lb (63.5 kg)  10/02/23 140 lb 12.8 oz (63.9 kg)  07/13/23 138 lb 2 oz (62.7 kg)    Physical Exam Constitutional:      General: She is not in acute distress.    Appearance: She is well-developed. She is obese.  HENT:     Head: Normocephalic.     Right Ear: External ear normal.     Left Ear: External ear normal.     Nose: Nose normal.  Eyes:      General:        Right eye: No discharge.        Left eye: No discharge.     Conjunctiva/sclera: Conjunctivae normal.     Pupils: Pupils are equal, round, and reactive to light.  Neck:     Thyroid: No thyromegaly.     Vascular: No JVD.     Trachea: No tracheal deviation.  Cardiovascular:     Rate and Rhythm: Normal rate and regular rhythm.     Heart sounds: Normal heart sounds.  Pulmonary:     Effort: No respiratory distress.     Breath sounds: No stridor. No wheezing.  Abdominal:     General: Bowel sounds are normal. There is no distension.     Palpations: Abdomen is soft. There is no mass.     Tenderness: There is no abdominal tenderness. There is no guarding or rebound.  Musculoskeletal:        General: No tenderness.     Cervical back: Normal range of motion and neck supple. No rigidity.     Right lower leg: No edema.     Left lower leg: No edema.  Lymphadenopathy:     Cervical: No cervical adenopathy.  Skin:    Findings: No erythema or rash.  Neurological:     Cranial Nerves: No cranial nerve deficit.     Motor: No abnormal muscle tone.     Coordination: Coordination normal.     Deep Tendon Reflexes: Reflexes normal.  Psychiatric:        Behavior: Behavior normal.        Thought Content: Thought content normal.        Judgment: Judgment normal.   L knee w/pain   Procedure Note :     Procedure : Joint Injection, L  knee   Indication:  Joint osteoarthritis with refractory  chronic pain.   Risks including unsuccessful procedure , bleeding, infection, bruising, skin atrophy, "steroid flare-up" and others were explained to the patient in detail as well as the benefits. Informed consent was obtained verbally.  Tthe patient was placed in a comfortable position. Lateral approach was used. Skin was prepped with Betadine and alcohol  and anesthetized a cooling spray. Then, a 5 cc syringe with a 1.5 inch long 25-gauge needle was used for a joint injection.. The needle was  advanced  Into the knee joint cavity. I aspirated a small amount of intra-articular fluid to confirm correct placement of the needle and injected the joint with 5 mL of 2% lidocaine and 40 mg of Depo-Medrol .  Band-Aid was applied.   Tolerated well. Complications: None. Good pain relief following the procedure.   Postprocedure instructions :    A Band-Aid should be left on for 12 hours. Injection therapy is not a cure itself. It is used in conjunction with other modalities. You can use nonsteroidal anti-inflammatories like ibuprofen , hot and cold compresses. Rest is recommended in the next 24 hours. You need to report immediately  if fever, chills or any signs of infection develop.   Lab Results  Component Value Date   WBC 6.0 04/27/2023   HGB 12.7 04/27/2023   HCT 37.6 04/27/2023   PLT 220.0 04/27/2023   GLUCOSE 99 04/27/2023   CHOL 238 (H) 05/06/2021   TRIG 106.0 05/06/2021   HDL 65.10 05/06/2021   LDLDIRECT 143.0 03/02/2018   LDLCALC 152 (H) 05/06/2021   ALT 18 04/27/2023   AST 18 04/27/2023   NA 135 04/27/2023   K 3.6 04/27/2023   CL 100 04/27/2023   CREATININE 0.76 04/27/2023   BUN 21 04/27/2023   CO2 27 04/27/2023   TSH 1.39 05/06/2021   INR 1.0 02/23/2020   HGBA1C 5.9 09/29/2007    CT ABDOMEN PELVIS W CONTRAST  Result Date: 10/29/2023 CLINICAL DATA:  Endometrial adenocarcinoma. Restaging. * Tracking Code: BO * EXAM: CT ABDOMEN AND PELVIS WITH CONTRAST TECHNIQUE: Multidetector CT imaging of the abdomen and pelvis was performed using the standard protocol following bolus administration of intravenous contrast. RADIATION DOSE REDUCTION: This exam was performed according to the departmental dose-optimization program which includes automated exposure control, adjustment of the mA and/or kV according to patient size and/or use of iterative reconstruction technique. CONTRAST:  OMNIPAQUE IOHEXOL 300 MG/ML  SOLN COMPARISON:  03/21/2022 CT abdomen/pelvis FINDINGS: Lower  chest: Tiny solid 0.2 cm medial right middle lobe pulmonary nodule (series 4/image 22), stable since at least 09/30/2021 CT and considered benign. No acute abnormality at the lung bases. Coronary atherosclerosis. Hepatobiliary: Normal liver size. No liver mass. Cholelithiasis. No biliary ductal dilatation. Pancreas: Normal, with no mass or duct dilation. Spleen: Normal size. No mass. Adrenals/Urinary Tract: Normal right adrenal. Left adrenal 1.2 cm nodule with density 49 HU, stable size, compatible with an adenoma. Simple 1.4 cm upper left renal cyst with additional scattered subcentimeter hypodense left renal lesions that are too small to characterize, for which no follow-up imaging is recommended. No hydronephrosis. Normal bladder. Stomach/Bowel: Small hiatal hernia. Otherwise normal nondistended stomach. Normal caliber small bowel with no small bowel wall thickening. Normal appendix. Oral contrast transits to the colon. Marked left colonic diverticulosis with no large bowel wall thickening or acute pericolonic fat stranding. Vascular/Lymphatic: Atherosclerotic nonaneurysmal abdominal aorta. Patent portal, splenic, hepatic and renal veins. No pathologically enlarged lymph nodes in the abdomen or pelvis. Reproductive: Status post hysterectomy, with no abnormal findings at the vaginal cuff. No adnexal mass. Other: No pneumoperitoneum, ascites or focal fluid collection. Musculoskeletal: No aggressive appearing focal osseous lesions. Marked thoracolumbar degenerative disc disease. IMPRESSION: 1. No evidence of metastatic disease in the abdomen or pelvis. 2. Cholelithiasis. 3. Marked left colonic diverticulosis. 4. Small hiatal hernia. 5.  Aortic Atherosclerosis (ICD10-I70.0). Electronically Signed   By: Delbert Phenix M.D.   On: 10/29/2023 08:36    Assessment & Plan:   Problem List Items Addressed This Visit     Anxiety disorder    Chronic  Cont on Alprazolam prn  Potential benefits of a long term  benzodiazepines  use as well as potential risks  and complications were explained to the  patient and were aknowledged. Son died in the fall of 11/17/21 Grieving. Discussed      Relevant Medications   traZODone (DESYREL) 50 MG tablet   Other Relevant Orders   Comprehensive metabolic panel   CBC with Differential/Platelet   Iron, TIBC and Ferritin Panel   Essential hypertension - Primary    Chronic Cont on Captopril Risks associated with treatment noncompliance were discussed. Compliance was encouraged.       Relevant Orders   Comprehensive metabolic panel   CBC with Differential/Platelet   Iron, TIBC and Ferritin Panel   Knee pain, left    See procedure      Irritable bowel syndrome (IBS)    Bentyl prn      Relevant Medications   Pancrelipase, Lip-Prot-Amyl, (CREON) 24000-76000 units CPEP   dicyclomine (BENTYL) 10 MG capsule   Urinary tract infection    On daily Keflex      Endometrial adenocarcinoma (HCC)    F/u w/GYN      Relevant Orders   Comprehensive metabolic panel   CBC with Differential/Platelet   Iron, TIBC and Ferritin Panel      Meds ordered this encounter  Medications   traZODone (DESYREL) 50 MG tablet    Sig: Take 1-2 tablets (50-100 mg total) by mouth at bedtime.    Dispense:  60 tablet    Refill:  5   Pancrelipase, Lip-Prot-Amyl, (CREON) 24000-76000 units CPEP    Sig: TAKE ONE CAPSULE BY MOUTH 3 TIMES A DAY WITH FOOD    Dispense:  270 capsule    Refill:  3   dicyclomine (BENTYL) 10 MG capsule    Sig: Take 1 capsule (10 mg total) by mouth 4 (four) times daily -  before meals and at bedtime.    Dispense:  90 capsule    Refill:  3      Follow-up: Return in about 4 months (around 03/09/2024) for a follow-up visit.  Sonda Primes, MD

## 2023-11-10 NOTE — Assessment & Plan Note (Signed)
See procedure 

## 2023-11-10 NOTE — Assessment & Plan Note (Signed)
F/u w/GYN

## 2023-11-10 NOTE — Assessment & Plan Note (Signed)
Bentyl prn

## 2023-11-10 NOTE — Assessment & Plan Note (Signed)
Chronic Cont on Captopril Risks associated with treatment noncompliance were discussed. Compliance was encouraged.

## 2023-11-10 NOTE — Addendum Note (Signed)
Addended by: Delsa Grana R on: 11/10/2023 04:56 PM   Modules accepted: Orders

## 2023-11-10 NOTE — Assessment & Plan Note (Signed)
Chronic  Cont on Alprazolam prn  Potential benefits of a long term benzodiazepines  use as well as potential risks  and complications were explained to the patient and were aknowledged. Son died in the fall of 11/23/2021 Grieving. Discussed

## 2023-11-10 NOTE — Assessment & Plan Note (Signed)
On daily Keflex

## 2023-11-11 LAB — IRON,TIBC AND FERRITIN PANEL
%SAT: 20 % (ref 16–45)
Ferritin: 169 ng/mL (ref 16–288)
Iron: 52 ug/dL (ref 45–160)
TIBC: 259 ug/dL (ref 250–450)

## 2023-11-30 ENCOUNTER — Encounter: Payer: Self-pay | Admitting: Internal Medicine

## 2023-12-01 ENCOUNTER — Encounter: Payer: Self-pay | Admitting: Internal Medicine

## 2023-12-02 ENCOUNTER — Other Ambulatory Visit: Payer: Self-pay | Admitting: Internal Medicine

## 2023-12-02 MED ORDER — MIRABEGRON ER 50 MG PO TB24
50.0000 mg | ORAL_TABLET | Freq: Every day | ORAL | 11 refills | Status: AC
Start: 2023-12-02 — End: ?

## 2023-12-05 ENCOUNTER — Other Ambulatory Visit: Payer: Self-pay | Admitting: Internal Medicine

## 2023-12-21 ENCOUNTER — Ambulatory Visit: Payer: Self-pay | Admitting: Internal Medicine
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
## 2023-12-22 NOTE — Telephone Encounter (Signed)
 I can see her on Friday at 8:00.  Otherwise, please see another provider.  Thank you

## 2023-12-25 ENCOUNTER — Ambulatory Visit (INDEPENDENT_AMBULATORY_CARE_PROVIDER_SITE_OTHER): Payer: 59

## 2023-12-25 ENCOUNTER — Encounter: Payer: Self-pay | Admitting: Internal Medicine

## 2023-12-25 ENCOUNTER — Ambulatory Visit (INDEPENDENT_AMBULATORY_CARE_PROVIDER_SITE_OTHER): Payer: 59 | Admitting: Internal Medicine

## 2023-12-25 VITALS — BP 130/82 | HR 59 | Temp 98.2°F | Ht 60.0 in | Wt 142.0 lb

## 2023-12-25 DIAGNOSIS — Z66 Do not resuscitate: Secondary | ICD-10-CM | POA: Diagnosis not present

## 2023-12-25 DIAGNOSIS — M79605 Pain in left leg: Secondary | ICD-10-CM | POA: Diagnosis not present

## 2023-12-25 DIAGNOSIS — R296 Repeated falls: Secondary | ICD-10-CM | POA: Diagnosis not present

## 2023-12-25 DIAGNOSIS — I10 Essential (primary) hypertension: Secondary | ICD-10-CM | POA: Diagnosis not present

## 2023-12-25 DIAGNOSIS — M79672 Pain in left foot: Secondary | ICD-10-CM | POA: Diagnosis not present

## 2023-12-25 DIAGNOSIS — M5442 Lumbago with sciatica, left side: Secondary | ICD-10-CM

## 2023-12-25 DIAGNOSIS — G8929 Other chronic pain: Secondary | ICD-10-CM

## 2023-12-25 DIAGNOSIS — R531 Weakness: Secondary | ICD-10-CM | POA: Diagnosis not present

## 2023-12-25 DIAGNOSIS — M25572 Pain in left ankle and joints of left foot: Secondary | ICD-10-CM | POA: Diagnosis not present

## 2023-12-25 NOTE — Assessment & Plan Note (Signed)
 Worse Needs more PCS hrs - 120 hrs

## 2023-12-25 NOTE — Assessment & Plan Note (Signed)
 Discussed, DNR form filled out

## 2023-12-25 NOTE — Assessment & Plan Note (Signed)
 PCS filled out - needs more hrs

## 2023-12-25 NOTE — Assessment & Plan Note (Signed)
 No change

## 2023-12-25 NOTE — Progress Notes (Signed)
 Subjective:  [name], female    DOB: 07/26/1934  Age: 89 y.o. MRN: [id number]  CC: Fall (Pt had a fall on 12/05/2023... Pts left leg is swollen a/w her foot and ankle. Pt is having pain in her foot and ankle. Pt is asking for x-ray of her ankle.)   HPI Airelle Cadotte Fall in the bathtub (Pt had a fall on 12/05/2023... Pts left leg is swollen a/w her L foot and ankle. Pt is having pain in her foot and ankle. Pt is asking for x-ray of her ankle.  C/o need for more help at home  Discuss DNR  Outpatient Medications Prior to Visit  Medication Sig Dispense Refill   acetaminophen  (TYLENOL ) 325 MG tablet Take 352 mg by mouth every 6 (six) hours as needed for mild pain or moderate pain.     acyclovir  (ZOVIRAX ) 400 MG tablet Take 1 tablet (400 mg total) by mouth 3 (three) times daily. 21 tablet 3   ALPRAZolam  (XANAX ) 1 MG tablet TAKE 1 TABLET BY MOUTH AT BEDTIME AS NEEDED FOR ANXIETY. 90 tablet 1   amLODipine  (NORVASC ) 5 MG tablet TAKE 1 TABLET (5 MG TOTAL) BY MOUTH DAILY. 90 tablet 3   captopril  (CAPOTEN ) 50 MG tablet TAKE 1 TABLET BY MOUTH TWICE A DAY 180 tablet 1   cephALEXin  (KEFLEX ) 500 MG capsule TAKE 1 CAPSULE (500 MG TOTAL) BY MOUTH DAILY 90 capsule 1   Cholecalciferol  1000 UNITS tablet Take 1,000 Units by mouth daily. Vitamin d      Coenzyme Q10 (CO Q-10) 100 MG CAPS Take by mouth daily.     dicyclomine  (BENTYL ) 10 MG capsule Take 1 capsule (10 mg total) by mouth 4 (four) times daily -  before meals and at bedtime. 90 capsule 3   ezetimibe  (ZETIA ) 10 MG tablet Take 1 tablet (10 mg total) by mouth daily. 90 tablet 3   meloxicam  (MOBIC ) 7.5 MG tablet Take 1 tablet (7.5 mg total) by mouth daily as needed for pain. 30 tablet 2   mirabegron  ER (MYRBETRIQ ) 50 MG TB24 tablet Take 1 tablet (50 mg total) by mouth daily. 30 tablet 11   omeprazole  (PRILOSEC) 20 MG capsule Take 1 capsule (20 mg total) by mouth 2 (two) times daily before a meal. 180 capsule 1   Pancrelipase ,  Lip-Prot-Amyl, (CREON ) 24000-76000 units CPEP TAKE ONE CAPSULE BY MOUTH 3 TIMES A DAY WITH FOOD 270 capsule 3   polyethylene glycol powder (GAVILAX) 17 GM/SCOOP powder MIX 1 CAPFUL (17 GRAMS) AS DIRECTED AND DRINK TWICE A DAY AS NEEDED FOR MODERATE CONSTIPATION 510 g 5   propranolol  ER (INDERAL  LA) 120 MG 24 hr capsule TAKE 1 CAPSULE BY MOUTH EVERY DAY 90 capsule 2   RESTASIS  0.05 % ophthalmic emulsion Place 1 drop into both eyes 2 (two) times daily.      traZODone  (DESYREL ) 50 MG tablet TAKE 1-2 TABLETS BY MOUTH AT BEDTIME. 180 tablet 2   triamcinolone  ointment (KENALOG ) 0.5 % Apply 1 Application topically 2 (two) times daily. 60 g 1   Trolamine LIQD Use Blue Emu on sore joints 3 times a day. 226 mL 2   Vitamin A  2400 MCG (8000 UT) CAPS Take 2,400 mcg by mouth daily.     No facility-administered medications prior to visit.    ROS: Review of Systems  Constitutional:  Positive for fatigue. Negative for activity change, appetite change, chills and unexpected weight change.  HENT:  Negative for congestion, mouth sores and sinus pressure.  Eyes:  Negative for visual disturbance.  Respiratory:  Negative for cough and chest tightness.   Gastrointestinal:  Negative for abdominal pain and nausea.  Genitourinary:  Negative for difficulty urinating, frequency and vaginal pain.  Musculoskeletal:  Positive for back pain and gait problem.  Skin:  Positive for color change. Negative for pallor and rash.  Neurological:  Positive for weakness. Negative for dizziness, tremors, numbness and headaches.  Psychiatric/Behavioral:  Negative for confusion, decreased concentration, sleep disturbance and suicidal ideas. The patient is nervous/anxious.     Objective:  BP 130/82 (BP Location: Left Arm, Patient Position: Sitting, Cuff Size: Normal)   Pulse (!) 59   Temp 98.2 F (36.8 C) (Oral)   Ht 5' (1.524 m)   Wt 142 lb (64.4 kg)   SpO2 96%   BMI 27.73 kg/m   BP Readings from Last 3 Encounters:   12/25/23 130/82  11/10/23 (!) 130/98  10/02/23 136/79    Wt Readings from Last 3 Encounters:  12/25/23 142 lb (64.4 kg)  11/10/23 140 lb (63.5 kg)  10/02/23 140 lb 12.8 oz (63.9 kg)    Physical Exam Constitutional:      General: She is not in acute distress.    Appearance: She is well-developed. She is not toxic-appearing.  HENT:     Head: Normocephalic.     Right Ear: External ear normal.     Left Ear: External ear normal.     Nose: Nose normal.  Eyes:     General:        Right eye: No discharge.        Left eye: No discharge.     Conjunctiva/sclera: Conjunctivae normal.     Pupils: Pupils are equal, round, and reactive to light.  Neck:     Thyroid : No thyromegaly.     Vascular: No JVD.     Trachea: No tracheal deviation.  Cardiovascular:     Rate and Rhythm: Normal rate and regular rhythm.     Heart sounds: Normal heart sounds.  Pulmonary:     Effort: No respiratory distress.     Breath sounds: No stridor. No wheezing.  Abdominal:     General: Bowel sounds are normal. There is no distension.     Palpations: Abdomen is soft. There is no mass.     Tenderness: There is no abdominal tenderness. There is no guarding or rebound.  Musculoskeletal:        General: No tenderness.     Cervical back: Normal range of motion and neck supple. No rigidity.  Lymphadenopathy:     Cervical: No cervical adenopathy.  Skin:    Findings: No erythema or rash.  Neurological:     Mental Status: She is oriented to person, place, and time.     Cranial Nerves: No cranial nerve deficit.     Motor: No abnormal muscle tone.     Coordination: Coordination abnormal.     Gait: Gait abnormal.     Deep Tendon Reflexes: Reflexes normal.  Psychiatric:        Behavior: Behavior normal.        Thought Content: Thought content normal.        Judgment: Judgment normal.   Ataxic slightly Using a cane L ankle, L foot w/pain and swelling  Discussed, DNR form filled out    A total time of 45  minutes was spent preparing to see the patient, reviewing tests, x-rays, operative reports and other medical records.  Also, obtaining history and  performing comprehensive physical exam.  Additionally, counseling the patient regarding the above listed issues.   Finally, documenting clinical information in the health records, coordination of care, educating the patient - DNR, PCS form.   Lab Results  Component Value Date   WBC 5.2 11/10/2023   HGB 13.2 11/10/2023   HCT 40.2 11/10/2023   PLT 218.0 11/10/2023   GLUCOSE 96 11/10/2023   CHOL 238 (H) 05/06/2021   TRIG 106.0 05/06/2021   HDL 65.10 05/06/2021   LDLDIRECT 143.0 03/02/2018   LDLCALC 152 (H) 05/06/2021   ALT 18 11/10/2023   AST 19 11/10/2023   NA 135 11/10/2023   K 3.9 11/10/2023   CL 99 11/10/2023   CREATININE 0.68 11/10/2023   BUN 15 11/10/2023   CO2 29 11/10/2023   TSH 1.39 05/06/2021   INR 1.0 02/23/2020   HGBA1C 5.9 09/29/2007    CT ABDOMEN PELVIS W CONTRAST Result Date: 10/29/2023 CLINICAL DATA:  Endometrial adenocarcinoma. Restaging. * Tracking Code: BO * EXAM: CT ABDOMEN AND PELVIS WITH CONTRAST TECHNIQUE: Multidetector CT imaging of the abdomen and pelvis was performed using the standard protocol following bolus administration of intravenous contrast. RADIATION DOSE REDUCTION: This exam was performed according to the departmental dose-optimization program which includes automated exposure control, adjustment of the mA and/or kV according to patient size and/or use of iterative reconstruction technique. CONTRAST:  OMNIPAQUE  IOHEXOL  300 MG/ML  SOLN COMPARISON:  03/21/2022 CT abdomen/pelvis FINDINGS: Lower chest: Tiny solid 0.2 cm medial right middle lobe pulmonary nodule (series 4/image 22), stable since at least 09/30/2021 CT and considered benign. No acute abnormality at the lung bases. Coronary atherosclerosis. Hepatobiliary: Normal liver size. No liver mass. Cholelithiasis. No biliary ductal dilatation. Pancreas:  Normal, with no mass or duct dilation. Spleen: Normal size. No mass. Adrenals/Urinary Tract: Normal right adrenal. Left adrenal 1.2 cm nodule with density 49 HU, stable size, compatible with an adenoma. Simple 1.4 cm upper left renal cyst with additional scattered subcentimeter hypodense left renal lesions that are too small to characterize, for which no follow-up imaging is recommended. No hydronephrosis. Normal bladder. Stomach/Bowel: Small hiatal hernia. Otherwise normal nondistended stomach. Normal caliber small bowel with no small bowel wall thickening. Normal appendix. Oral contrast transits to the colon. Marked left colonic diverticulosis with no large bowel wall thickening or acute pericolonic fat stranding. Vascular/Lymphatic: Atherosclerotic nonaneurysmal abdominal aorta. Patent portal, splenic, hepatic and renal veins. No pathologically enlarged lymph nodes in the abdomen or pelvis. Reproductive: Status post hysterectomy, with no abnormal findings at the vaginal cuff. No adnexal mass. Other: No pneumoperitoneum, ascites or focal fluid collection. Musculoskeletal: No aggressive appearing focal osseous lesions. Marked thoracolumbar degenerative disc disease. IMPRESSION: 1. No evidence of metastatic disease in the abdomen or pelvis. 2. Cholelithiasis. 3. Marked left colonic diverticulosis. 4. Small hiatal hernia. 5.  Aortic Atherosclerosis (ICD10-I70.0). Electronically Signed   By: Selinda DELENA Blue M.D.   On: 10/29/2023 08:36    Assessment & Plan:   Problem List Items Addressed This Visit     Essential hypertension   Chronic Cont on Captopril  Risks associated with treatment noncompliance were discussed. Compliance was encouraged.       Leg pain, anterior, left - Primary   S/p fall (Pt had a fall on 12/05/2023... Pts left leg is swollen a/w her foot and ankle. Pt is having pain in her foot and ankle. Pt is asking for x-ray of her ankle.) X ray L foot, L ankle      Relevant Orders   DG  Foot 2  Views Left   DG Ankle Complete Left   Low back pain   No change      Generalized weakness   Worse Needs more PCS hrs - 120 hrs      Falls   PCS filled out - needs more hrs      Do not intubate, perform CPR, or defibrillate   Discussed, DNR form filled out         No orders of the defined types were placed in this encounter.     Follow-up: Return in about 3 months (around 03/24/2024) for a follow-up visit.  Marolyn Noel, MD

## 2023-12-25 NOTE — Assessment & Plan Note (Signed)
 S/p fall (Pt had a fall on 12/05/2023... Pts left leg is swollen a/w her foot and ankle. Pt is having pain in her foot and ankle. Pt is asking for x-ray of her ankle.) X ray L foot, L ankle

## 2023-12-25 NOTE — Assessment & Plan Note (Signed)
 Chronic Cont on Captopril Risks associated with treatment noncompliance were discussed. Compliance was encouraged.

## 2023-12-30 DIAGNOSIS — Z86007 Personal history of in-situ neoplasm of skin: Secondary | ICD-10-CM | POA: Diagnosis not present

## 2023-12-30 DIAGNOSIS — L821 Other seborrheic keratosis: Secondary | ICD-10-CM | POA: Diagnosis not present

## 2023-12-30 DIAGNOSIS — D0439 Carcinoma in situ of skin of other parts of face: Secondary | ICD-10-CM | POA: Diagnosis not present

## 2023-12-30 DIAGNOSIS — Z08 Encounter for follow-up examination after completed treatment for malignant neoplasm: Secondary | ICD-10-CM | POA: Diagnosis not present

## 2023-12-30 DIAGNOSIS — Z85828 Personal history of other malignant neoplasm of skin: Secondary | ICD-10-CM | POA: Diagnosis not present

## 2024-01-11 ENCOUNTER — Ambulatory Visit
Admission: RE | Admit: 2024-01-11 | Discharge: 2024-01-11 | Disposition: A | Discharge status: Home / Self Care | Payer: 59 | Source: Ambulatory Visit | Attending: Radiation Oncology | Admitting: Radiation Oncology

## 2024-01-11 ENCOUNTER — Other Ambulatory Visit: Payer: Self-pay

## 2024-01-11 ENCOUNTER — Encounter: Payer: Self-pay | Admitting: Radiation Oncology

## 2024-01-11 VITALS — BP 168/79 | HR 62 | Temp 97.5°F | Resp 20 | Ht 60.0 in | Wt 140.8 lb

## 2024-01-11 DIAGNOSIS — C541 Malignant neoplasm of endometrium: Secondary | ICD-10-CM

## 2024-01-11 NOTE — Progress Notes (Signed)
Sophia Santos is here today for follow up post radiation to the pelvic. Patient is [name] and speaks [place].  An interpreter is present to help with communication issues.  They completed their radiation on: 12/18/2021   Does the patient complain of any of the following:  Pain: She reports hurting intermittently in her pelvic area.  Abdominal bloating: Denies Diarrhea/Constipation: She reports taking Miralax for constipation daily Nausea/Vomiting: Denies Vaginal Discharge: Denies Blood in Urine or Stool: Denies Urinary Issues (dysuria/incomplete emptying/ incontinence/ increased frequency/urgency): No Does patient report using vaginal dilator 2-3 times a week and/or sexually active 2-3 weeks: She reports using the dilators for the first three months. Post radiation skin changes:  Denies    BP (!) 168/79 (BP Location: Left Arm, Patient Position: Sitting, Cuff Size: Normal)   Pulse 62   Temp (!) 97.5 F (36.4 C)   Resp 20   Ht 5' (1.524 m)   Wt 140 lb 12.8 oz (63.9 kg)   SpO2 99%   BMI 27.50 kg/m

## 2024-01-11 NOTE — Progress Notes (Signed)
Radiation Oncology         [phone] ________________________________  Name: Sophia Santos MRN: [id number]  Date: 01/11/2024  DOB: [birth year]  Follow-Up Visit Note  CC: Plotnikov, [name], MD  Plotnikov, [name], MD    ICD-10-CM   1. Endometrial adenocarcinoma (HCC)  C54.1       Diagnosis: Stage IIIA grade 2 endometrial cancer; endometrial adenocarcinoma    Patient is Sophia Santos and speaks [place].  An interpreter is present to help with communication issues.   Interval Since Last Radiation:  2 years and 23 days   Intent: Curative  Radiation Treatment Dates: 11/20/2021 through 12/18/2021 Site Technique Total Dose (Gy) Dose per Fx (Gy) Completed Fx Beam Energies  Vagina: Pelvis HDR-brachy 30/30 6 5/5 Ir-192    Narrative:  The patient returns today for routine follow-up. She was last seen here for follow-up on 07/13/23.    During her most recent visit with Dr. Pricilla Santos on 10/02/23, the patient reported having some increased nocturia and intermittent pelvic pain (baseline and present prior to her surgery). Pelvic exam performed at that time also noted mildly atrophic vaginal mucosa, some apical prolapse, and some fullness at the cuff which Dr. Pricilla Santos noted to feel like redundant colon, though slightly different than her prior exam.   In light of her high-risk histology, exam findings, and reports of pelvic pain, a CT AP was performed on 10/16/23 which demonstrated no evidence of metastatic disease or recurrent disease in the abdomen or pelvis.   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 She is being followed by dermatology and plastics at [facility] in this setting.    No other significant interval history since the patient was last seen for follow-up.   The patient reports intermittent pain in her pelvic area. This pain is consistent with the pain she was experiencing prior to her surgery. She takes Miralax daily for  constipation and has one bowel movement each day. She denies any abnormal bloating, vaginal discharge or bleeding, or changes in urinary habits. She is not using her dilators.   Allergies:  is allergic to diclofenac sodium, fosamax [alendronate sodium], lipitor [atorvastatin], voltaren [diclofenac sodium], and zocor [simvastatin].  Meds: Current Outpatient Medications  Medication Sig Dispense Refill   acetaminophen (TYLENOL) 325 MG tablet Take 352 mg by mouth every 6 (six) hours as needed for mild pain or moderate pain.     ALPRAZolam (XANAX) 1 MG tablet TAKE 1 TABLET BY MOUTH AT BEDTIME AS NEEDED FOR ANXIETY. 90 tablet 1   amLODipine (NORVASC) 5 MG tablet TAKE 1 TABLET (5 MG TOTAL) BY MOUTH DAILY. 90 tablet 3   captopril (CAPOTEN) 50 MG tablet TAKE 1 TABLET BY MOUTH TWICE A DAY 180 tablet 1   cephALEXin (KEFLEX) 500 MG capsule TAKE 1 CAPSULE (500 MG TOTAL) BY MOUTH DAILY 90 capsule 1   Cholecalciferol 1000 UNITS tablet Take 1,000 Units by mouth daily. Vitamin d     Coenzyme Q10 (CO Q-10) 100 MG CAPS Take by mouth daily.     dicyclomine (BENTYL) 10 MG capsule Take 1 capsule (10 mg total) by mouth 4 (four) times daily -  before meals and at bedtime. 90 capsule 3   ezetimibe (ZETIA) 10 MG tablet Take 1 tablet (10 mg total) by mouth daily. 90 tablet 3   meloxicam (MOBIC) 7.5 MG tablet Take 1 tablet (7.5 mg total) by mouth daily as needed for pain. 30 tablet 2  mirabegron ER (MYRBETRIQ) 50 MG TB24 tablet Take 1 tablet (50 mg total) by mouth daily. 30 tablet 11   omeprazole (PRILOSEC) 20 MG capsule Take 1 capsule (20 mg total) by mouth 2 (two) times daily before a meal. 180 capsule 1   Pancrelipase, Lip-Prot-Amyl, (CREON) 24000-76000 units CPEP TAKE ONE CAPSULE BY MOUTH 3 TIMES A DAY WITH FOOD 270 capsule 3   polyethylene glycol powder (GAVILAX) 17 GM/SCOOP powder MIX 1 CAPFUL (17 GRAMS) AS DIRECTED AND DRINK TWICE A DAY AS NEEDED FOR MODERATE CONSTIPATION 510 g 5   propranolol ER (INDERAL LA) 120  MG 24 hr capsule TAKE 1 CAPSULE BY MOUTH EVERY DAY 90 capsule 2   RESTASIS 0.05 % ophthalmic emulsion Place 1 drop into both eyes 2 (two) times daily.      traZODone (DESYREL) 50 MG tablet TAKE 1-2 TABLETS BY MOUTH AT BEDTIME. 180 tablet 2   Trolamine LIQD Use Blue Emu on sore joints 3 times a day. 226 mL 2   Vitamin A 2400 MCG (8000 UT) CAPS Take 2,400 mcg by mouth daily.     acyclovir (ZOVIRAX) 400 MG tablet Take 1 tablet (400 mg total) by mouth 3 (three) times daily. (Patient not taking: Reported on 01/11/2024) 21 tablet 3   triamcinolone ointment (KENALOG) 0.5 % Apply 1 Application topically 2 (two) times daily. (Patient not taking: Reported on 01/11/2024) 60 g 1   No current facility-administered medications for this encounter.    Physical Findings: The patient is in no acute distress. Patient is alert and oriented.  height is 5' (1.524 m) and weight is 140 lb 12.8 oz (63.9 kg). Her temperature is 97.5 F (36.4 C) (abnormal). Her blood pressure is 168/79 (abnormal) and her pulse is 62. Her respiration is 20 and oxygen saturation is 99%. .  Lungs are clear to auscultation bilaterally. Heart has regular rate and rhythm. No palpable cervical, supraclavicular, or axillary adenopathy. Abdomen soft, non-tender, normal bowel sounds.  On pelvic examination the external genitalia were unremarkable. A speculum exam was performed. There is an erythematous area noted along the left side of the cuff   Some anterior prolapse appreciated.  Some radiation changes noted at the vaginal cuff.  On bimanual examination the vaginal cuff is intact. There is a nodular area palpated along the left cuff measuring approximately 5 mm.  Rectovaginal confirms.   Lab Findings: Lab Results  Component Value Date   WBC 5.2 11/10/2023   HGB 13.2 11/10/2023   HCT 40.2 11/10/2023   MCV 96.1 11/10/2023   PLT 218.0 11/10/2023    Radiographic Findings: DG Ankle Complete Left Result Date: 12/25/2023 CLINICAL DATA:  Left  ankle pain after fall 2 weeks ago. EXAM: LEFT ANKLE COMPLETE - 3+ VIEW COMPARISON:  None Available. FINDINGS: There is no evidence of fracture, dislocation, or joint effusion. There is no evidence of arthropathy or other focal bone abnormality. Soft tissues are unremarkable. IMPRESSION: Negative. Electronically Signed   By: Lupita Raider M.D.   On: 12/25/2023 10:16   DG Foot 2 Views Left Result Date: 12/25/2023 CLINICAL DATA:  Left foot pain after fall 2 weeks ago. EXAM: LEFT FOOT - 2 VIEW COMPARISON:  None Available. FINDINGS: There is no evidence of fracture or dislocation. There is no evidence of arthropathy or other focal bone abnormality. Soft tissues are unremarkable. IMPRESSION: Negative. Electronically Signed   By: Lupita Raider M.D.   On: 12/25/2023 10:15    Impression: Stage IIIA grade 2 endometrial cancer; endometrial  adenocarcinoma    Patient continues to experience abdominal pain. The pain has not changed/worsened since her previous CT scan which showed no evidence of disease recurrence. This pain was present prior to her cancer diagnosis, so it is not concerning from an oncologic perspective. She has been told it is likely from constipation.   Exam today reveals a small nodular area along the left cuff.  This could possibly be scar tissue versus recurrence.  Encouraged patient to use increase her dilator use to 2-3 times per week.   Plan:  Per NCCN guidelines, patient will follow up with Dr. Pricilla Santos in 3 months and radiation oncology in 6 months. Patient was educated on signs/symptoms that may indicate disease recurrence and understands to notify us if she experiences any of them.  In light of this clinical finding today she  will be set up for exam with Dr. Pricilla Santos in the near future.  Assuming exam is okay she will follow-up with Dr. Pricilla Santos in 3 months and radiation oncology in 6 months.   30 minutes of total time was spent for this patient encounter, including preparation,  face-to-face counseling with the patient and coordination of care, physical exam, and documentation of the encounter. ____________________________________   Bryan Lemma, PA-C   Billie Lade, PhD, MD   [facility]  Radiation Oncology Direct Dial: [phone]  Fax: [phone] .com    This document serves as a record of services personally performed by Antony Blackbird, MD and Bryan Lemma, PA-C. It was created on his behalf by Neena Rhymes, a trained medical scribe. The creation of this record is based on the scribe's personal observations and the provider's statements to them. This document has been checked and approved by the attending provider.

## 2024-01-12 ENCOUNTER — Telehealth: Payer: Self-pay | Admitting: Oncology

## 2024-01-12 NOTE — Telephone Encounter (Signed)
Sophia Santos called back and confirmed the appointment tomorrow.

## 2024-01-12 NOTE — Telephone Encounter (Signed)
Left a message for [facility] with appointment tomorrow at 12:45 with Dr. Tamela Oddi.  Requested a return call to confirm.

## 2024-01-13 ENCOUNTER — Other Ambulatory Visit: Payer: Self-pay | Admitting: Internal Medicine

## 2024-01-13 ENCOUNTER — Inpatient Hospital Stay: Payer: 59 | Attending: Obstetrics & Gynecology | Admitting: Obstetrics & Gynecology

## 2024-01-13 ENCOUNTER — Encounter: Payer: Self-pay | Admitting: Obstetrics & Gynecology

## 2024-01-13 VITALS — BP 148/90 | HR 85 | Temp 98.1°F | Resp 20 | Wt 140.6 lb

## 2024-01-13 DIAGNOSIS — N898 Other specified noninflammatory disorders of vagina: Secondary | ICD-10-CM | POA: Insufficient documentation

## 2024-01-13 DIAGNOSIS — C541 Malignant neoplasm of endometrium: Secondary | ICD-10-CM | POA: Insufficient documentation

## 2024-01-13 NOTE — Patient Instructions (Addendum)
Today Dr. Tamela Oddi took a biopsy of the top of the vagina. You may have spotting or a brownish discharge from the medication used to stop bleeding.   We will contact you with the results.   Plan to see our office in April and Dr. Roselind Messier in July or sooner if needed.

## 2024-01-13 NOTE — Progress Notes (Signed)
Follow Up Note: Gyn-Onc  Sophia Santos 89 y.o. female  CC: Possible vaginal lesion   HPI: The oncology history was reviewed.  Interval History:  The pt, w/a h/o a  Stage IIIA grade 2 endometrial cancer; was recently seen in follow-up by Dr. Roselind Messier several days ago and a vaginal lesion was described on exam along the left aspect of the vagina cuff.   Procedure: Vaginal Biopsy Description: Informed, written consent was obtained w/the assistance of a [place] interpreter. A friable area in the vaginal cuff was identified. Lidocaine gel was applied,  The area was prepped w/betadine.  A biopsy was taken from this area and sent for pathologic exam. Monsel's solution was applied to the biopsy site to control bleeding. Gauze was packed into the vagina to manage persistent bleeding, which was later removed with no further bleeding noted.    Review of Systems  Review of Systems  Constitutional:  Negative for malaise/fatigue and weight loss.  Respiratory:  Negative for shortness of breath and wheezing.   Cardiovascular:  Negative for chest pain and leg swelling.  Gastrointestinal:  Negative for abdominal pain, blood in stool, constipation, nausea and vomiting.  Genitourinary:  Negative for dysuria, frequency, hematuria and urgency.  Musculoskeletal:  Negative for joint pain and myalgias.  Neurological:  Negative for weakness.  Psychiatric/Behavioral:  Negative for depression. The patient does not have insomnia.    Current medications, allergy, social history, past surgical history, past medical history, family history were all reviewed.    Vitals:  BP (!) 148/90 Comment: manual recheck, MD notified, monitor at home and f/u with PCP  Pulse 85   Temp 98.1 F (36.7 C) (Oral)   Resp 20   Wt 140 lb 9.6 oz (63.8 kg)   SpO2 96%   BMI 27.46 kg/m    Physical Exam:  Physical Exam Exam conducted with a chaperone present.  Constitutional:      General: She is not in acute  distress. Cardiovascular:     Rate and Rhythm: Normal rate and regular rhythm.  Pulmonary:     Effort: Pulmonary effort is normal.     Breath sounds: Normal breath sounds. No wheezing or rhonchi.  Abdominal:     Palpations: Abdomen is soft.     Tenderness: There is no abdominal tenderness. There is no right CVA tenderness or left CVA tenderness.     Hernia: No hernia is present.  Genitourinary:    General: Normal vulva.     Urethra: No urethral lesion.     Vagina: Sclerotic areas at the cuff.  No discreet lesions appreciated. Small friable area to the left of midline at the apex Musculoskeletal:     Cervical back: Neck supple.     Right lower leg: No edema.     Left lower leg: No edema.  Lymphadenopathy:     Upper Body:     Right upper body: No supraclavicular adenopathy.     Left upper body: No supraclavicular adenopathy.     Lower Body: No right inguinal adenopathy. No left inguinal adenopathy.  Skin:    Findings: No rash.  Neurological:     Mental Status: She is oriented to person, place, and time.   Assessment/Plan: Possible vaginal Lesion H/O Stage IIIA grade 2 endometrial cancer;Suspect sequelae of RT. Noted friable area in the vaginal cuff. No pain or bleeding reported. Biopsy performed today with some difficulty due to bleeding. -Review the biopsy results -Continue with previously scheduled appointments.  I personally spent 25 minutes  face-to-face and non-face-to-face in the care of this patient, which includes all pre, intra, and post visit time on the date of service.  Antionette Char, MD

## 2024-01-14 ENCOUNTER — Telehealth: Payer: Self-pay | Admitting: *Deleted

## 2024-01-14 ENCOUNTER — Encounter: Payer: Self-pay | Admitting: *Deleted

## 2024-01-14 LAB — SURGICAL PATHOLOGY

## 2024-01-14 NOTE — Telephone Encounter (Signed)
Attempted to reach patient through [place] id # [phone] to relay message from nurse practitioner. Left voicemail requesting call back.   2nd attempt to reach patient to relay message from nurse practitioner through [facility] # id [phone]. LVM requesting call back.

## 2024-01-14 NOTE — Telephone Encounter (Signed)
-----   Message from Doylene Bode sent at 01/14/2024  1:26 PM EST ----- Can someone please call her with the biopsy results? Also check on the bleeding from the procedure. No precancer or cancer on biopsy. We will keep a close eye on the area at her visits.   Also looks like there was a question about an address. Not sure what this is pertaining to. ----- Message ----- From: Interface, [facility] Sent: 01/14/2024  11:45 AM EST To: Doylene Bode, NP

## 2024-01-15 NOTE — Telephone Encounter (Signed)
[name],The daughter in law of Ms.Gustafson, returned Jaimie's call. Biopsy results were relayed. No questions at this time. She reports pt has had no bleeding since in office procedure and reports she is doing fine.   Message sent to Warner Mccreedy NP

## 2024-01-20 ENCOUNTER — Other Ambulatory Visit: Payer: Self-pay | Admitting: Internal Medicine

## 2024-02-10 ENCOUNTER — Ambulatory Visit: Payer: 59 | Admitting: Internal Medicine

## 2024-02-13 ENCOUNTER — Other Ambulatory Visit: Payer: Self-pay | Admitting: Internal Medicine

## 2024-02-29 ENCOUNTER — Encounter: Payer: Self-pay | Admitting: Internal Medicine

## 2024-02-29 ENCOUNTER — Ambulatory Visit (INDEPENDENT_AMBULATORY_CARE_PROVIDER_SITE_OTHER): Payer: 59 | Admitting: Internal Medicine

## 2024-02-29 VITALS — BP 130/72 | HR 75 | Temp 97.9°F | Ht 60.0 in | Wt 136.0 lb

## 2024-02-29 DIAGNOSIS — M5442 Lumbago with sciatica, left side: Secondary | ICD-10-CM | POA: Diagnosis not present

## 2024-02-29 DIAGNOSIS — C541 Malignant neoplasm of endometrium: Secondary | ICD-10-CM

## 2024-02-29 DIAGNOSIS — R531 Weakness: Secondary | ICD-10-CM | POA: Diagnosis not present

## 2024-02-29 DIAGNOSIS — D539 Nutritional anemia, unspecified: Secondary | ICD-10-CM | POA: Diagnosis not present

## 2024-02-29 DIAGNOSIS — F411 Generalized anxiety disorder: Secondary | ICD-10-CM | POA: Diagnosis not present

## 2024-02-29 DIAGNOSIS — G8929 Other chronic pain: Secondary | ICD-10-CM

## 2024-02-29 LAB — CBC WITH DIFFERENTIAL/PLATELET
Basophils Absolute: 0 10*3/uL (ref 0.0–0.1)
Basophils Relative: 0.5 % (ref 0.0–3.0)
Eosinophils Absolute: 0.1 10*3/uL (ref 0.0–0.7)
Eosinophils Relative: 1.1 % (ref 0.0–5.0)
HCT: 37.7 % (ref 36.0–46.0)
Hemoglobin: 12.9 g/dL (ref 12.0–15.0)
Lymphocytes Relative: 35.2 % (ref 12.0–46.0)
Lymphs Abs: 1.8 10*3/uL (ref 0.7–4.0)
MCHC: 34.1 g/dL (ref 30.0–36.0)
MCV: 95.3 fl (ref 78.0–100.0)
Monocytes Absolute: 0.5 10*3/uL (ref 0.1–1.0)
Monocytes Relative: 9.4 % (ref 3.0–12.0)
Neutro Abs: 2.7 10*3/uL (ref 1.4–7.7)
Neutrophils Relative %: 53.8 % (ref 43.0–77.0)
Platelets: 219 10*3/uL (ref 150.0–400.0)
RBC: 3.95 Mil/uL (ref 3.87–5.11)
RDW: 13.2 % (ref 11.5–15.5)
WBC: 5 10*3/uL (ref 4.0–10.5)

## 2024-02-29 LAB — COMPREHENSIVE METABOLIC PANEL
ALT: 13 U/L (ref 0–35)
AST: 15 U/L (ref 0–37)
Albumin: 4 g/dL (ref 3.5–5.2)
Alkaline Phosphatase: 96 U/L (ref 39–117)
BUN: 16 mg/dL (ref 6–23)
CO2: 29 meq/L (ref 19–32)
Calcium: 9.3 mg/dL (ref 8.4–10.5)
Chloride: 101 meq/L (ref 96–112)
Creatinine, Ser: 0.69 mg/dL (ref 0.40–1.20)
GFR: 77.74 mL/min (ref >60.00)
Glucose, Bld: 114 mg/dL — ABNORMAL HIGH (ref 70–99)
Potassium: 3.8 meq/L (ref 3.5–5.1)
Sodium: 137 meq/L (ref 135–145)
Total Bilirubin: 0.4 mg/dL (ref 0.2–1.2)
Total Protein: 6.7 g/dL (ref 6.0–8.3)

## 2024-02-29 LAB — TSH: TSH: 1.12 u[IU]/mL (ref 0.35–5.50)

## 2024-02-29 NOTE — Assessment & Plan Note (Signed)
Chronic Cont on Alprazolam prn  Potential benefits of a long term benzodiazepines  use as well as potential risks  and complications were explained to the patient and were aknowledged.

## 2024-02-29 NOTE — Assessment & Plan Note (Signed)
 Check CBC

## 2024-02-29 NOTE — Progress Notes (Signed)
 Subjective:  [name], female    DOB: May 16, 1934  Age: 89 y.o. 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 Getting weaker, hard to walk, balance problems  Outpatient Medications Prior to Visit  Medication Sig Dispense Refill   acetaminophen (TYLENOL) 325 MG tablet Take 352 mg by mouth every 6 (six) hours as needed for mild pain or moderate pain.     ALPRAZolam (XANAX) 1 MG tablet TAKE 1 TABLET BY MOUTH AT BEDTIME AS NEEDED FOR ANXIETY. 90 tablet 1   amLODipine (NORVASC) 5 MG tablet TAKE 1 TABLET (5 MG TOTAL) BY MOUTH DAILY. 90 tablet 3   captopril (CAPOTEN) 50 MG tablet TAKE 1 TABLET BY MOUTH TWICE A DAY 180 tablet 1   cephALEXin (KEFLEX) 500 MG capsule TAKE 1 CAPSULE (500 MG TOTAL) BY MOUTH DAILY 90 capsule 1   Cholecalciferol 1000 UNITS tablet Take 1,000 Units by mouth daily. Vitamin d     Coenzyme Q10 (CO Q-10) 100 MG CAPS Take by mouth daily.     dicyclomine (BENTYL) 10 MG capsule TAKE 1 CAPSULE (10 MG TOTAL) BY MOUTH 4 TIMES A DAY BEFORE MEALS AND AT BEDTIME 90 capsule 3   ezetimibe (ZETIA) 10 MG tablet TAKE 1 TABLET BY MOUTH EVERY DAY 90 tablet 3   meloxicam (MOBIC) 7.5 MG tablet Take 1 tablet (7.5 mg total) by mouth daily as needed for pain. 30 tablet 2   mirabegron ER (MYRBETRIQ) 50 MG TB24 tablet Take 1 tablet (50 mg total) by mouth daily. 30 tablet 11   omeprazole (PRILOSEC) 20 MG capsule Take 1 capsule (20 mg total) by mouth 2 (two) times daily before a meal. 180 capsule 1   Pancrelipase, Lip-Prot-Amyl, (CREON) 24000-76000 units CPEP TAKE ONE CAPSULE BY MOUTH 3 TIMES A DAY WITH FOOD 270 capsule 3   polyethylene glycol powder (GAVILAX) 17 GM/SCOOP powder MIX 1 CAPFUL (17 GRAMS) AS DIRECTED AND DRINK TWICE A DAY AS NEEDED FOR MODERATE CONSTIPATION 510 g 5   propranolol ER (INDERAL LA) 120 MG 24 hr capsule TAKE 1 CAPSULE BY MOUTH EVERY DAY 90  capsule 2   RESTASIS 0.05 % ophthalmic emulsion Place 1 drop into both eyes 2 (two) times daily.      traZODone (DESYREL) 50 MG tablet TAKE 1-2 TABLETS BY MOUTH AT BEDTIME. 180 tablet 2   Trolamine LIQD Use Blue Emu on sore joints 3 times a day. 226 mL 2   Vitamin A 2400 MCG (8000 UT) CAPS Take 2,400 mcg by mouth daily.     acyclovir (ZOVIRAX) 400 MG tablet Take 1 tablet (400 mg total) by mouth 3 (three) times daily. (Patient not taking: Reported on 02/29/2024) 21 tablet 3   No facility-administered medications prior to visit.    ROS: Review of Systems  Constitutional:  Negative for activity change, appetite change, chills, fatigue and unexpected weight change.  HENT:  Negative for congestion, mouth sores and sinus pressure.   Eyes:  Negative for visual disturbance.  Respiratory:  Negative for cough and chest tightness.   Gastrointestinal:  Negative for abdominal pain and nausea.  Genitourinary:  Negative for difficulty urinating, frequency and vaginal pain.  Musculoskeletal:  Positive for arthralgias, back pain and gait problem.  Skin:  Negative for pallor and rash.  Neurological:  Negative for dizziness, tremors, weakness, numbness and headaches.  Psychiatric/Behavioral:  Negative for confusion and  sleep disturbance.     Objective:  BP 130/72   Pulse 75   Temp 97.9 F (36.6 C) (Oral)   Ht 5' (1.524 m)   Wt 136 lb (61.7 kg)   SpO2 94%   BMI 26.56 kg/m   BP Readings from Last 3 Encounters:  02/29/24 130/72  01/13/24 (!) 148/90  01/11/24 (!) 168/79    Wt Readings from Last 3 Encounters:  02/29/24 136 lb (61.7 kg)  01/13/24 140 lb 9.6 oz (63.8 kg)  01/11/24 140 lb 12.8 oz (63.9 kg)    Physical Exam Constitutional:      Appearance: She is obese.     Lab Results  Component Value Date   WBC 5.2 11/10/2023   HGB 13.2 11/10/2023   HCT 40.2 11/10/2023   PLT 218.0 11/10/2023   GLUCOSE 96 11/10/2023   CHOL 238 (H) 05/06/2021   TRIG 106.0 05/06/2021   HDL 65.10  05/06/2021   LDLDIRECT 143.0 03/02/2018   LDLCALC 152 (H) 05/06/2021   ALT 18 11/10/2023   AST 19 11/10/2023   NA 135 11/10/2023   K 3.9 11/10/2023   CL 99 11/10/2023   CREATININE 0.68 11/10/2023   BUN 15 11/10/2023   CO2 29 11/10/2023   TSH 1.39 05/06/2021   INR 1.0 02/23/2020   HGBA1C 5.9 09/29/2007    No results found.  Assessment & Plan:   Problem List Items Addressed This Visit     Anxiety disorder   Chronic  Cont on Alprazolam prn  Potential benefits of a long term benzodiazepines  use as well as potential risks  and complications were explained to the patient and were aknowledged.       Low back pain - Primary   Start home PT      Relevant Orders   Ambulatory referral to Home Health   CBC with Differential/Platelet   Comprehensive metabolic panel   TSH   Endometrial adenocarcinoma (HCC)   S/p recent bx      Deficiency anemia   Check CBC      Relevant Orders   CBC with Differential/Platelet   Comprehensive metabolic panel   TSH   Generalized weakness   Start PT      Relevant Orders   Ambulatory referral to Home Health   CBC with Differential/Platelet   Comprehensive metabolic panel   TSH      No orders of the defined types were placed in this encounter.     Follow-up: Return in about 3 months (around 05/31/2024) for a follow-up visit.  Sonda Primes, MD

## 2024-02-29 NOTE — Assessment & Plan Note (Signed)
 Start PT

## 2024-02-29 NOTE — Assessment & Plan Note (Signed)
Start home PT

## 2024-02-29 NOTE — Assessment & Plan Note (Signed)
 S/p recent bx

## 2024-03-01 ENCOUNTER — Encounter: Payer: Self-pay | Admitting: Internal Medicine

## 2024-03-27 DIAGNOSIS — D630 Anemia in neoplastic disease: Secondary | ICD-10-CM | POA: Diagnosis not present

## 2024-03-27 DIAGNOSIS — K219 Gastro-esophageal reflux disease without esophagitis: Secondary | ICD-10-CM | POA: Diagnosis not present

## 2024-03-27 DIAGNOSIS — M5442 Lumbago with sciatica, left side: Secondary | ICD-10-CM | POA: Diagnosis not present

## 2024-03-27 DIAGNOSIS — I10 Essential (primary) hypertension: Secondary | ICD-10-CM | POA: Diagnosis not present

## 2024-03-27 DIAGNOSIS — M1990 Unspecified osteoarthritis, unspecified site: Secondary | ICD-10-CM | POA: Diagnosis not present

## 2024-03-27 DIAGNOSIS — Z792 Long term (current) use of antibiotics: Secondary | ICD-10-CM | POA: Diagnosis not present

## 2024-03-27 DIAGNOSIS — Z791 Long term (current) use of non-steroidal anti-inflammatories (NSAID): Secondary | ICD-10-CM | POA: Diagnosis not present

## 2024-03-27 DIAGNOSIS — G8929 Other chronic pain: Secondary | ICD-10-CM | POA: Diagnosis not present

## 2024-03-27 DIAGNOSIS — Z556 Problems related to health literacy: Secondary | ICD-10-CM | POA: Diagnosis not present

## 2024-03-27 DIAGNOSIS — K589 Irritable bowel syndrome without diarrhea: Secondary | ICD-10-CM | POA: Diagnosis not present

## 2024-03-31 DIAGNOSIS — Z792 Long term (current) use of antibiotics: Secondary | ICD-10-CM | POA: Diagnosis not present

## 2024-03-31 DIAGNOSIS — G8929 Other chronic pain: Secondary | ICD-10-CM | POA: Diagnosis not present

## 2024-03-31 DIAGNOSIS — I10 Essential (primary) hypertension: Secondary | ICD-10-CM | POA: Diagnosis not present

## 2024-03-31 DIAGNOSIS — K589 Irritable bowel syndrome without diarrhea: Secondary | ICD-10-CM | POA: Diagnosis not present

## 2024-03-31 DIAGNOSIS — Z556 Problems related to health literacy: Secondary | ICD-10-CM | POA: Diagnosis not present

## 2024-03-31 DIAGNOSIS — D630 Anemia in neoplastic disease: Secondary | ICD-10-CM | POA: Diagnosis not present

## 2024-03-31 DIAGNOSIS — M5442 Lumbago with sciatica, left side: Secondary | ICD-10-CM | POA: Diagnosis not present

## 2024-03-31 DIAGNOSIS — K219 Gastro-esophageal reflux disease without esophagitis: Secondary | ICD-10-CM | POA: Diagnosis not present

## 2024-03-31 DIAGNOSIS — Z791 Long term (current) use of non-steroidal anti-inflammatories (NSAID): Secondary | ICD-10-CM | POA: Diagnosis not present

## 2024-03-31 DIAGNOSIS — M1990 Unspecified osteoarthritis, unspecified site: Secondary | ICD-10-CM | POA: Diagnosis not present

## 2024-04-01 ENCOUNTER — Inpatient Hospital Stay: Payer: 59 | Attending: Obstetrics & Gynecology | Admitting: Gynecologic Oncology

## 2024-04-01 ENCOUNTER — Encounter: Payer: Self-pay | Admitting: Gynecologic Oncology

## 2024-04-01 VITALS — BP 132/82 | HR 109 | Temp 97.6°F | Resp 17 | Ht 60.0 in | Wt 134.8 lb

## 2024-04-01 DIAGNOSIS — Z9221 Personal history of antineoplastic chemotherapy: Secondary | ICD-10-CM | POA: Insufficient documentation

## 2024-04-01 DIAGNOSIS — Z90710 Acquired absence of both cervix and uterus: Secondary | ICD-10-CM | POA: Diagnosis not present

## 2024-04-01 DIAGNOSIS — Z90722 Acquired absence of ovaries, bilateral: Secondary | ICD-10-CM | POA: Insufficient documentation

## 2024-04-01 DIAGNOSIS — N3941 Urge incontinence: Secondary | ICD-10-CM | POA: Diagnosis not present

## 2024-04-01 DIAGNOSIS — Z9079 Acquired absence of other genital organ(s): Secondary | ICD-10-CM | POA: Diagnosis not present

## 2024-04-01 DIAGNOSIS — Z8542 Personal history of malignant neoplasm of other parts of uterus: Secondary | ICD-10-CM | POA: Diagnosis not present

## 2024-04-01 DIAGNOSIS — N898 Other specified noninflammatory disorders of vagina: Secondary | ICD-10-CM | POA: Diagnosis not present

## 2024-04-01 DIAGNOSIS — Z923 Personal history of irradiation: Secondary | ICD-10-CM | POA: Insufficient documentation

## 2024-04-01 DIAGNOSIS — C541 Malignant neoplasm of endometrium: Secondary | ICD-10-CM

## 2024-04-01 NOTE — Patient Instructions (Signed)
 There are some changes on your exam today that concern me.  Since I did not see you at the time of your last exam, it is difficult to know if this is what was seen then or related to what was done to help stop bleeding at the time of your biopsy.  I like to see you in a month for follow-up to repeat an exam.  We will also get a CT scan to evaluate for any evidence of recurrent cancer.  Please call with any new or concerning symptoms such as vaginal bleeding.

## 2024-04-01 NOTE — Progress Notes (Signed)
 Gynecologic Oncology Return Clinic Visit  04/01/24  Reason for Visit: Surveillance visit the setting of endometrial cancer history   Treatment History: Oncology History Overview Note  Endometrioid FIGO grade 1-2, MSI high   Endometrial adenocarcinoma (HCC)  07/19/2021 Imaging   US  pelvis 1. Endometrial thickness of 5.6 mm. In the setting of post-menopausal bleeding, endometrial sampling is indicated to exclude carcinoma.  2. Densely calcified shadowing mass within the right uterus presumably representing a calcified fibroid.   07/31/2021 Pathology Results   SURGICAL PATHOLOGY   FINAL MICROSCOPIC DIAGNOSIS:   A. ENDOMETRIUM, BIOPSY:  - Endometrial adenocarcinoma, FIGO grade 1.  See comment  SUMMARY INTERPRETATION: ABNORMAL   There is loss of the major and minor MMR proteins MLH1 and PMS2. The loss of expression may be secondary to promoter hyper-methylation, gene mutation or other genetic event. BRAF mutation testing and/or MLH1 methylation testing is indicated. The presence of a BRAF mutation and/or MLH1 hypermethylation is indicative of a sporadic-type tumor. The absence of either BRAF mutation and/or presence of normal methylation indicate the possible presence of a hereditary germline mutation (e.g. Lynch syndrome) and referral to genetic counseling is warranted.    08/05/2021 Initial Diagnosis   Endometrial adenocarcinoma (HCC)   08/21/2021 Pathology Results   FINAL MICROSCOPIC DIAGNOSIS:   A. LYMPH NODE, SENTINEL, RIGHT OBTURATOR, EXCISION:  - Lymph node, negative for carcinoma (0/1)   B. LYMPH NODE, SENTINEL, LEFT OBTURATOR, EXCISION:  - Lymph node, negative for carcinoma (0/1)   C. UTERUS, CERVIX, BILATERAL FALLOPIAN TUBES AND OVARIES:  - Endometrioid adenocarcinoma, FIGO grade 2  - Carcinoma invades through the entire myometrium and focally involves  the serosal surface  - Benign unremarkable cervix  - Benign bilateral fallopian tubes and ovaries  - See oncology table   - See comment   COMMENT:   C.  Tumor showed areas with clear cell morphology but immunostains for Napsin A and P504S do not show evidence of clear cell carcinoma.  Also, there is significant tumor necrosis, but the viable portion of tumor appears to be FIGO grade 2.  Dr. Secundino Dach reviewed the select slides from the case and concurs with presence of serosal invasion.    ONCOLOGY TABLE:   UTERUS, CARCINOMA OR CARCINOSARCOMA: Resection   Procedure: Total hysterectomy and bilateral salpingo-oophorectomy  Histologic Type: Endometrioid adenocarcinoma  Histologic Grade: FIGO grade 1  Myometrial Invasion:       Depth of Myometrial Invasion (mm): 17 mm       Myometrial Thickness (mm): 17 mm       Percentage of Myometrial Invasion: 100%  Uterine Serosa Involvement: Present, focal  Cervical stromal Involvement: Not identified  Extent of involvement of other tissue/organs: Not identified  Peritoneal/Ascitic Fluid: Not applicable  Lymphovascular Invasion: Not identified  Regional Lymph Nodes:       Pelvic Lymph Nodes Examined:                                   2 Sentinel                                   0 Non-sentinel                                   2 Total  Pelvic Lymph Nodes with Metastasis: 0       Para-aortic Lymph Nodes Examined:                                    0 Sentinel                                    0 Non-sentinel                                    0 Total       Para-aortic Lymph Nodes with Metastasis: 0  Distant Metastasis:       Distant Site(s) Involved: Not applicable  Pathologic Stage Classification (pTNM, AJCC 8th Edition): pT3a, pN0  Ancillary Studies: MMR / MSI testing will be ordered  Representative Tumor Block: C10  Comment(s): Pancytokeratin was performed on the lymph nodes and is negative.    08/21/2021 Surgery   Surgeon: Diania Fortes    Operation: Robotic-assisted laparoscopic total hysterectomy with bilateral salpingoophorectomy, SLN biopsy      Operative Findings:  : 6cm uterus with anterior fibroid (approx 4cm). Normal appearing tubes and ovaries. No suspicious nodes. Bilateral mapping.     10/03/2021 Cancer Staging   Staging form: Corpus Uteri - Carcinoma and Carcinosarcoma, AJCC 8th Edition - Clinical stage from 10/03/2021: FIGO Stage III (cT3, cN0, cM0) - Signed by Almeda Jacobs, MD on 10/03/2021 Stage prefix: Initial diagnosis   10/14/2021 - 01/20/2022 Chemotherapy   Patient is on Treatment Plan : Uterine Carboplatin  (AUC 5) 21d      11/20/2021 - 12/18/2021 Radiation Therapy   11/20/2021 through 12/18/2021 Site Technique Total Dose (Gy) Dose per Fx (Gy) Completed Fx Beam Energies  Vagina: Pelvis HDR-brachy 30/30 6 5/5 Ir-192      03/21/2022 Imaging   No evidence of recurrent or metastatic carcinoma within the abdomen or pelvis.   Colonic diverticulosis, without radiographic evidence of diverticulitis.   Stable small benign left adrenal adenoma.   Aortic Atherosclerosis (ICD10-I70.0).     Interval History: Overall doing well.  Continues to have intermittent abdominal and pelvic pain, worse with movement, that has been present since prior to surgery.  Denies any change to the pain.  Had some bleeding after her last biopsy.  Denies any recent bleeding or discharge.  Continues to struggle with constipation, takes MiraLAX  regularly and sometimes still has small, hard pellets.  Notes some urinary frequency especially at night.  Limits how much water  she drinks secondary to urgency and urge incontinence.  Past Medical/Surgical History: Past Medical History:  Diagnosis Date   Arthritis    Complication of anesthesia    "years ago, hard to awaken, no with recent procedures."   Diverticulosis    Endometrial cancer (HCC)    Family history of colon cancer 10/15/2021   Gallstones    GERD (gastroesophageal reflux disease)    Headache 08/15/2021   none recent   History of radiation therapy    Endometrium- vcc brachytherapy  11/20/21-12/18/21- Dr. Retta Caster   HTN (hypertension)    Hyperlipidemia    Irritable bowel syndrome (IBS)    Osteoarthritis    Osteopenia    Osteoporosis    PMB (postmenopausal bleeding) 08/15/2021   Skin cancer    skin cancer -  Urinary frequency 08/15/2021   Wears glasses 08/15/2021    Past Surgical History:  Procedure Laterality Date   bilateral cataracts     COLONOSCOPY     INGUINAL HERNIA REPAIR Bilateral 09/13/2020   Procedure: LAPAROSCOPIC BILATERAL INGUINAL HERNIA REPAIR WITH MESH;  Surgeon: Shela Derby, MD;  Location: [facility];  Service: General;  Laterality: Bilateral;   ROBOTIC ASSISTED TOTAL HYSTERECTOMY WITH BILATERAL SALPINGO OOPHERECTOMY Bilateral 08/21/2021   Procedure: XI ROBOTIC ASSISTED TOTAL HYSTERECTOMY WITH BILATERAL SALPINGO OOPHORECTOMY;  Surgeon: Alphonso Aschoff, MD;  Location: [facility] [place];  Service: Gynecology;  Laterality: Bilateral;   SENTINEL NODE BIOPSY N/A 08/21/2021   Procedure: SENTINEL NODE BIOPSY;  Surgeon: Alphonso Aschoff, MD;  Location: [facility];  Service: Gynecology;  Laterality: N/A;   uterine prolapse surgery      Family History  Problem Relation Age of Onset   Colon cancer Mother 46   Stroke Mother    Parkinson's disease Father    Dementia Father    Colon cancer Sister 49   Colon polyps Sister    Cancer Maternal Aunt        unknown type   Cancer Paternal Aunt        unknown type; ?intestinal   Coronary artery disease Other        female 1st degree <50   Cancer Cousin        maternal female cousins; x3; unknown type   Breast cancer Cousin        maternal female cousin   Esophageal cancer Neg Hx    Stomach cancer Neg Hx    Anesthesia problems Neg Hx    Endometrial cancer Neg Hx    Pancreatic cancer Neg Hx    Prostate cancer Neg Hx     Social History   Socioeconomic History   Marital status: Widowed    Spouse name: Not on file   Number of children: 1   Years of education: Not on file   Highest  education level: Not on file  Occupational History   Occupation: retired  Tobacco Use   Smoking status: Never   Smokeless tobacco: Never  Vaping Use   Vaping status: Never Used  Substance and Sexual Activity   Alcohol use: No   Drug use: No   Sexual activity: Not Currently    Birth control/protection: None  Other Topics Concern   Not on file  Social History Narrative   One Boy    Daily Caffeine  - tea         Social Drivers of Health   Financial Resource Strain: Low Risk  (09/24/2022)   Overall Financial Resource Strain (CARDIA)    Difficulty of Paying Living Expenses: Not hard at all  Food Insecurity: No Food Insecurity (09/24/2022)   Hunger Vital Sign    Worried About Running Out of Food in the Last Year: Never true    Ran Out of Food in the Last Year: Never true  Transportation Needs: No Transportation Needs (09/24/2022)   PRAPARE - Administrator, Civil Service (Medical): No    Lack of Transportation (Non-Medical): No  Physical Activity: Inactive (09/24/2022)   Exercise Vital Sign    Days of Exercise per Week: 0 days    Minutes of Exercise per Session: 0 min  Stress: No Stress Concern Present (09/24/2022)   [employer] of Occupational Health - Occupational Stress Questionnaire    Feeling of Stress : Not at all  Social Connections: Moderately Integrated (  09/24/2022)   Social Connection and Isolation Panel [NHANES]    Frequency of Communication with Friends and Family: More than three times a week    Frequency of Social Gatherings with Friends and Family: More than three times a week    Attends Religious Services: More than 4 times per year    Active Member of [employer] or Organizations: Yes    Attends Banker Meetings: More than 4 times per year    Marital Status: Widowed    Current Medications:  Current Outpatient Medications:    acetaminophen  (TYLENOL ) 325 MG tablet, Take 352 mg by mouth every 6 (six) hours as needed for mild pain or  moderate pain., Disp: , Rfl:    acyclovir  (ZOVIRAX ) 400 MG tablet, Take 1 tablet (400 mg total) by mouth 3 (three) times daily. (Patient not taking: Reported on 02/29/2024), Disp: 21 tablet, Rfl: 3   ALPRAZolam  (XANAX ) 1 MG tablet, TAKE 1 TABLET BY MOUTH AT BEDTIME AS NEEDED FOR ANXIETY., Disp: 90 tablet, Rfl: 1   amLODipine  (NORVASC ) 5 MG tablet, TAKE 1 TABLET (5 MG TOTAL) BY MOUTH DAILY., Disp: 90 tablet, Rfl: 3   captopril  (CAPOTEN ) 50 MG tablet, TAKE 1 TABLET BY MOUTH TWICE A DAY, Disp: 180 tablet, Rfl: 1   cephALEXin  (KEFLEX ) 500 MG capsule, TAKE 1 CAPSULE (500 MG TOTAL) BY MOUTH DAILY, Disp: 90 capsule, Rfl: 1   Cholecalciferol  1000 UNITS tablet, Take 1,000 Units by mouth daily. Vitamin d , Disp: , Rfl:    Coenzyme Q10 (CO Q-10) 100 MG CAPS, Take by mouth daily., Disp: , Rfl:    dicyclomine  (BENTYL ) 10 MG capsule, TAKE 1 CAPSULE (10 MG TOTAL) BY MOUTH 4 TIMES A DAY BEFORE MEALS AND AT BEDTIME, Disp: 90 capsule, Rfl: 3   ezetimibe  (ZETIA ) 10 MG tablet, TAKE 1 TABLET BY MOUTH EVERY DAY, Disp: 90 tablet, Rfl: 3   meloxicam  (MOBIC ) 7.5 MG tablet, Take 1 tablet (7.5 mg total) by mouth daily as needed for pain., Disp: 30 tablet, Rfl: 2   mirabegron  ER (MYRBETRIQ ) 50 MG TB24 tablet, Take 1 tablet (50 mg total) by mouth daily., Disp: 30 tablet, Rfl: 11   omeprazole  (PRILOSEC) 20 MG capsule, Take 1 capsule (20 mg total) by mouth 2 (two) times daily before a meal., Disp: 180 capsule, Rfl: 1   Pancrelipase , Lip-Prot-Amyl, (CREON ) 24000-76000 units CPEP, TAKE ONE CAPSULE BY MOUTH 3 TIMES A DAY WITH FOOD, Disp: 270 capsule, Rfl: 3   polyethylene glycol powder (GAVILAX) 17 GM/SCOOP powder, MIX 1 CAPFUL (17 GRAMS) AS DIRECTED AND DRINK TWICE A DAY AS NEEDED FOR MODERATE CONSTIPATION, Disp: 510 g, Rfl: 5   propranolol  ER (INDERAL  LA) 120 MG 24 hr capsule, TAKE 1 CAPSULE BY MOUTH EVERY DAY, Disp: 90 capsule, Rfl: 2   RESTASIS  0.05 % ophthalmic emulsion, Place 1 drop into both eyes 2 (two) times daily. , Disp: ,  Rfl:    traZODone  (DESYREL ) 50 MG tablet, TAKE 1-2 TABLETS BY MOUTH AT BEDTIME., Disp: 180 tablet, Rfl: 2   Trolamine LIQD, Use Blue Emu on sore joints 3 times a day., Disp: 226 mL, Rfl: 2   Vitamin A  2400 MCG (8000 UT) CAPS, Take 2,400 mcg by mouth daily., Disp: , Rfl:   Review of Systems: Denies appetite changes, fevers, chills, fatigue, unexplained weight changes. Denies hearing loss, neck lumps or masses, mouth sores, ringing in ears or voice changes. Denies cough or wheezing.  Denies shortness of breath. Denies chest pain or palpitations. Denies leg swelling. Denies  abdominal distention, pain, blood in stools, constipation, diarrhea, nausea, vomiting, or early satiety. Denies pain with intercourse, dysuria, frequency, hematuria or incontinence. Denies hot flashes, pelvic pain, vaginal bleeding or vaginal discharge.   Denies joint pain, back pain or muscle pain/cramps. Denies itching, rash, or wounds. Denies dizziness, headaches, numbness or seizures. Denies swollen lymph nodes or glands, denies easy bruising or bleeding. Denies anxiety, depression, confusion, or decreased concentration.  Physical Exam: BP 132/82 (BP Location: Left Arm, Patient Position: Sitting)   Pulse (!) 109   Temp 97.6 F (36.4 C) (Oral)   Resp 17   Ht 5' (1.524 m)   Wt 134 lb 12.8 oz (61.1 kg)   SpO2 97%   BMI 26.33 kg/m  General: Alert, oriented, no acute distress. HEENT: Normocephalic, atraumatic, sclera anicteric. Chest: Clear to auscultation bilaterally.  No wheezes or rhonchi. Cardiovascular: Regular rate and rhythm, no murmurs. Abdomen: soft, nontender.  Normoactive bowel sounds.  No masses or hepatosplenomegaly appreciated.  Well-healed incisions. Extremities: Grossly normal range of motion.  Warm, well perfused.  No edema bilaterally. Skin: No rashes or lesions noted. Lymphatics: No cervical, supraclavicular, or inguinal adenopathy. GU: Normal appearing external genitalia without erythema,  excoriation, or lesions.  Speculum exam reveals mildly atrophic vaginal mucosa as well as some apical prolapse.  At the cuff, along the right apex, there is a 2 x 3 cm area of hypopigmented tissue almost with a cobblestone appearance and hemosiderin staining.  Atypical in appearance but not friable.  There is a smaller, 1 cm lesion at the left apex that is similar in appearance. The midportion of the cuff in normal.  On bimanual exam, the cuff is somewhat irregular but not nodular, no masses appreciated.  This was confirmed on rectovaginal exam.  Laboratory & Radiologic Studies: CT A/P 10/2023: 1. No evidence of metastatic disease in the abdomen or pelvis. 2. Cholelithiasis. 3. Marked left colonic diverticulosis. 4. Small hiatal hernia. 5.  Aortic Atherosclerosis (ICD10-I70.0).  Vaginal biopsy, apex: 12/2023 A. VAGINA, MIDLINE, APEX, BIOPSY:  -  Attenuated/atrophic squamous epithelium with reactive/reparative  change and benign submucosal glands..  -  Negative for dysplasia/malignancy.   Assessment & Plan: Sophia Santos is a 89 y.o. 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  Completed adjuvant chemotherapy and VBT in 01/2022. Post-treatment imaging in 03/2022 negative for evidence of disease. Recent CT in 10/2023 negative for recurrence. Recent vaginal cuff biopsy in 12/2023 negative for recurrence.   Patient is doing well.  She continues to have some abdominal and pelvic pain, unchanged since prior to surgery.  I suspect that some of this may be related to her constipation.  Reviewed findings from her exam today.  She had a biopsy performed in late January, little bit less than 3 months ago.  Sounds like a fair amount of Monsel's was used at the time of this biopsy.  Biopsy results were negative for malignancy.  Her exam is somewhat abnormal although findings not typical for cancer recurrence.  Given amount of Monsel's use,  it may be that change in pigmentation as well as some inflammation was caused by this.  We discussed getting updated imaging and having the patient return in 1 month.  Biopsy deferred today given possibility that exam findings are related to recent Monsel's use and because her recent biopsy was benign.  In the setting of her urinary urgency and urge incontinence, referral to urogynecology discussed and placed.  She endorses being on  medication previously for urinary symptoms.     I will see her back in 1 month to repeat an exam. If exam reassuring, per NCCN surveillance recommendations, we discussed continued surveillance visits every 3 months until 2-3 years after completion of adjuvant treatment.  If exam still abnormal, we discussed likely repeat biopsy at that time.  We reviewed signs and symptoms that would be concerning for cancer recurrence and I stressed the importance of calling if she develops any of these.  28 minutes of total time was spent for this patient encounter, including preparation, face-to-face counseling with the patient and coordination of care, and documentation of the encounter.  Wiley Hanger, MD  Division of Gynecologic Oncology  Department of Obstetrics and Gynecology  [facility] [place]  [facility]

## 2024-04-05 ENCOUNTER — Ambulatory Visit (HOSPITAL_COMMUNITY)
Admission: RE | Admit: 2024-04-05 | Discharge: 2024-04-05 | Disposition: A | Discharge status: Home / Self Care | Source: Ambulatory Visit | Attending: Gynecologic Oncology | Admitting: Gynecologic Oncology

## 2024-04-05 DIAGNOSIS — N281 Cyst of kidney, acquired: Secondary | ICD-10-CM | POA: Diagnosis not present

## 2024-04-05 DIAGNOSIS — C541 Malignant neoplasm of endometrium: Secondary | ICD-10-CM | POA: Insufficient documentation

## 2024-04-05 DIAGNOSIS — K5730 Diverticulosis of large intestine without perforation or abscess without bleeding: Secondary | ICD-10-CM | POA: Diagnosis not present

## 2024-04-05 DIAGNOSIS — K8020 Calculus of gallbladder without cholecystitis without obstruction: Secondary | ICD-10-CM | POA: Diagnosis not present

## 2024-04-05 MED ORDER — IOHEXOL 300 MG/ML  SOLN
100.0000 mL | Freq: Once | INTRAMUSCULAR | Status: AC | PRN
  Administered 2024-04-05: 100 mL via INTRAVENOUS

## 2024-04-05 MED ORDER — IOHEXOL 9 MG/ML PO SOLN
1000.0000 mL | ORAL | Status: AC
  Administered 2024-04-05: 1000 mL via ORAL

## 2024-04-06 DIAGNOSIS — I10 Essential (primary) hypertension: Secondary | ICD-10-CM | POA: Diagnosis not present

## 2024-04-06 DIAGNOSIS — M1990 Unspecified osteoarthritis, unspecified site: Secondary | ICD-10-CM | POA: Diagnosis not present

## 2024-04-06 DIAGNOSIS — Z556 Problems related to health literacy: Secondary | ICD-10-CM | POA: Diagnosis not present

## 2024-04-06 DIAGNOSIS — G8929 Other chronic pain: Secondary | ICD-10-CM | POA: Diagnosis not present

## 2024-04-06 DIAGNOSIS — M5442 Lumbago with sciatica, left side: Secondary | ICD-10-CM | POA: Diagnosis not present

## 2024-04-06 DIAGNOSIS — K219 Gastro-esophageal reflux disease without esophagitis: Secondary | ICD-10-CM | POA: Diagnosis not present

## 2024-04-06 DIAGNOSIS — Z791 Long term (current) use of non-steroidal anti-inflammatories (NSAID): Secondary | ICD-10-CM | POA: Diagnosis not present

## 2024-04-06 DIAGNOSIS — D630 Anemia in neoplastic disease: Secondary | ICD-10-CM | POA: Diagnosis not present

## 2024-04-06 DIAGNOSIS — K589 Irritable bowel syndrome without diarrhea: Secondary | ICD-10-CM | POA: Diagnosis not present

## 2024-04-06 DIAGNOSIS — Z792 Long term (current) use of antibiotics: Secondary | ICD-10-CM | POA: Diagnosis not present

## 2024-04-08 DIAGNOSIS — Z556 Problems related to health literacy: Secondary | ICD-10-CM | POA: Diagnosis not present

## 2024-04-08 DIAGNOSIS — I10 Essential (primary) hypertension: Secondary | ICD-10-CM | POA: Diagnosis not present

## 2024-04-08 DIAGNOSIS — D630 Anemia in neoplastic disease: Secondary | ICD-10-CM | POA: Diagnosis not present

## 2024-04-08 DIAGNOSIS — M1990 Unspecified osteoarthritis, unspecified site: Secondary | ICD-10-CM | POA: Diagnosis not present

## 2024-04-08 DIAGNOSIS — Z791 Long term (current) use of non-steroidal anti-inflammatories (NSAID): Secondary | ICD-10-CM | POA: Diagnosis not present

## 2024-04-08 DIAGNOSIS — K589 Irritable bowel syndrome without diarrhea: Secondary | ICD-10-CM | POA: Diagnosis not present

## 2024-04-08 DIAGNOSIS — M5442 Lumbago with sciatica, left side: Secondary | ICD-10-CM | POA: Diagnosis not present

## 2024-04-08 DIAGNOSIS — G8929 Other chronic pain: Secondary | ICD-10-CM | POA: Diagnosis not present

## 2024-04-08 DIAGNOSIS — Z792 Long term (current) use of antibiotics: Secondary | ICD-10-CM | POA: Diagnosis not present

## 2024-04-08 DIAGNOSIS — K219 Gastro-esophageal reflux disease without esophagitis: Secondary | ICD-10-CM | POA: Diagnosis not present

## 2024-04-13 ENCOUNTER — Encounter: Payer: Self-pay | Admitting: Gynecologic Oncology

## 2024-04-13 DIAGNOSIS — Z791 Long term (current) use of non-steroidal anti-inflammatories (NSAID): Secondary | ICD-10-CM | POA: Diagnosis not present

## 2024-04-13 DIAGNOSIS — I10 Essential (primary) hypertension: Secondary | ICD-10-CM | POA: Diagnosis not present

## 2024-04-13 DIAGNOSIS — Z556 Problems related to health literacy: Secondary | ICD-10-CM | POA: Diagnosis not present

## 2024-04-13 DIAGNOSIS — M1990 Unspecified osteoarthritis, unspecified site: Secondary | ICD-10-CM | POA: Diagnosis not present

## 2024-04-13 DIAGNOSIS — D630 Anemia in neoplastic disease: Secondary | ICD-10-CM | POA: Diagnosis not present

## 2024-04-13 DIAGNOSIS — M5442 Lumbago with sciatica, left side: Secondary | ICD-10-CM | POA: Diagnosis not present

## 2024-04-13 DIAGNOSIS — Z792 Long term (current) use of antibiotics: Secondary | ICD-10-CM | POA: Diagnosis not present

## 2024-04-13 DIAGNOSIS — K219 Gastro-esophageal reflux disease without esophagitis: Secondary | ICD-10-CM | POA: Diagnosis not present

## 2024-04-13 DIAGNOSIS — K589 Irritable bowel syndrome without diarrhea: Secondary | ICD-10-CM | POA: Diagnosis not present

## 2024-04-13 DIAGNOSIS — G8929 Other chronic pain: Secondary | ICD-10-CM | POA: Diagnosis not present

## 2024-04-14 DIAGNOSIS — D630 Anemia in neoplastic disease: Secondary | ICD-10-CM | POA: Diagnosis not present

## 2024-04-14 DIAGNOSIS — M1990 Unspecified osteoarthritis, unspecified site: Secondary | ICD-10-CM | POA: Diagnosis not present

## 2024-04-14 DIAGNOSIS — K219 Gastro-esophageal reflux disease without esophagitis: Secondary | ICD-10-CM | POA: Diagnosis not present

## 2024-04-14 DIAGNOSIS — G8929 Other chronic pain: Secondary | ICD-10-CM | POA: Diagnosis not present

## 2024-04-14 DIAGNOSIS — M5442 Lumbago with sciatica, left side: Secondary | ICD-10-CM | POA: Diagnosis not present

## 2024-04-14 DIAGNOSIS — C541 Malignant neoplasm of endometrium: Secondary | ICD-10-CM | POA: Diagnosis not present

## 2024-04-14 DIAGNOSIS — Z792 Long term (current) use of antibiotics: Secondary | ICD-10-CM | POA: Diagnosis not present

## 2024-04-14 DIAGNOSIS — I10 Essential (primary) hypertension: Secondary | ICD-10-CM | POA: Diagnosis not present

## 2024-04-14 DIAGNOSIS — Z556 Problems related to health literacy: Secondary | ICD-10-CM | POA: Diagnosis not present

## 2024-04-14 DIAGNOSIS — Z791 Long term (current) use of non-steroidal anti-inflammatories (NSAID): Secondary | ICD-10-CM | POA: Diagnosis not present

## 2024-04-14 DIAGNOSIS — K589 Irritable bowel syndrome without diarrhea: Secondary | ICD-10-CM | POA: Diagnosis not present

## 2024-04-14 DIAGNOSIS — F411 Generalized anxiety disorder: Secondary | ICD-10-CM

## 2024-04-15 ENCOUNTER — Other Ambulatory Visit: Payer: Self-pay | Admitting: Internal Medicine

## 2024-04-20 DIAGNOSIS — G8929 Other chronic pain: Secondary | ICD-10-CM | POA: Diagnosis not present

## 2024-04-20 DIAGNOSIS — D630 Anemia in neoplastic disease: Secondary | ICD-10-CM | POA: Diagnosis not present

## 2024-04-20 DIAGNOSIS — Z556 Problems related to health literacy: Secondary | ICD-10-CM | POA: Diagnosis not present

## 2024-04-20 DIAGNOSIS — Z791 Long term (current) use of non-steroidal anti-inflammatories (NSAID): Secondary | ICD-10-CM | POA: Diagnosis not present

## 2024-04-20 DIAGNOSIS — M1990 Unspecified osteoarthritis, unspecified site: Secondary | ICD-10-CM | POA: Diagnosis not present

## 2024-04-20 DIAGNOSIS — M5442 Lumbago with sciatica, left side: Secondary | ICD-10-CM | POA: Diagnosis not present

## 2024-04-20 DIAGNOSIS — Z792 Long term (current) use of antibiotics: Secondary | ICD-10-CM | POA: Diagnosis not present

## 2024-04-20 DIAGNOSIS — K589 Irritable bowel syndrome without diarrhea: Secondary | ICD-10-CM | POA: Diagnosis not present

## 2024-04-20 DIAGNOSIS — I10 Essential (primary) hypertension: Secondary | ICD-10-CM | POA: Diagnosis not present

## 2024-04-20 DIAGNOSIS — K219 Gastro-esophageal reflux disease without esophagitis: Secondary | ICD-10-CM | POA: Diagnosis not present

## 2024-04-27 ENCOUNTER — Encounter: Payer: Self-pay | Admitting: Gynecologic Oncology

## 2024-04-28 ENCOUNTER — Inpatient Hospital Stay: Attending: Obstetrics & Gynecology | Admitting: Gynecologic Oncology

## 2024-04-28 ENCOUNTER — Encounter: Payer: Self-pay | Admitting: Gynecologic Oncology

## 2024-04-28 VITALS — BP 145/87 | HR 65 | Temp 98.3°F | Resp 18 | Wt 136.2 lb

## 2024-04-28 DIAGNOSIS — Z9079 Acquired absence of other genital organ(s): Secondary | ICD-10-CM | POA: Insufficient documentation

## 2024-04-28 DIAGNOSIS — N898 Other specified noninflammatory disorders of vagina: Secondary | ICD-10-CM | POA: Insufficient documentation

## 2024-04-28 DIAGNOSIS — Z90722 Acquired absence of ovaries, bilateral: Secondary | ICD-10-CM | POA: Insufficient documentation

## 2024-04-28 DIAGNOSIS — Z90710 Acquired absence of both cervix and uterus: Secondary | ICD-10-CM | POA: Insufficient documentation

## 2024-04-28 DIAGNOSIS — Z923 Personal history of irradiation: Secondary | ICD-10-CM | POA: Insufficient documentation

## 2024-04-28 DIAGNOSIS — Z8542 Personal history of malignant neoplasm of other parts of uterus: Secondary | ICD-10-CM | POA: Insufficient documentation

## 2024-04-28 DIAGNOSIS — Z9221 Personal history of antineoplastic chemotherapy: Secondary | ICD-10-CM | POA: Diagnosis not present

## 2024-04-28 DIAGNOSIS — Z08 Encounter for follow-up examination after completed treatment for malignant neoplasm: Secondary | ICD-10-CM | POA: Insufficient documentation

## 2024-04-28 DIAGNOSIS — C541 Malignant neoplasm of endometrium: Secondary | ICD-10-CM

## 2024-04-28 NOTE — Progress Notes (Signed)
 Gynecologic Oncology Return Clinic Visit  04/28/24  Reason for Visit: repeat vaginal exam  Treatment History: Oncology History Overview Note  Endometrioid FIGO grade 1-2, MSI high   Endometrial adenocarcinoma (HCC)  07/19/2021 Imaging   US  pelvis 1. Endometrial thickness of 5.6 mm. In the setting of post-menopausal bleeding, endometrial sampling is indicated to exclude carcinoma.  2. Densely calcified shadowing mass within the right uterus presumably representing a calcified fibroid.   07/31/2021 Pathology Results   SURGICAL PATHOLOGY   FINAL MICROSCOPIC DIAGNOSIS:   A. ENDOMETRIUM, BIOPSY:  - Endometrial adenocarcinoma, FIGO grade 1.  See comment  SUMMARY INTERPRETATION: ABNORMAL   There is loss of the major and minor MMR proteins MLH1 and PMS2. The loss of expression may be secondary to promoter hyper-methylation, gene mutation or other genetic event. BRAF mutation testing and/or MLH1 methylation testing is indicated. The presence of a BRAF mutation and/or MLH1 hypermethylation is indicative of a sporadic-type tumor. The absence of either BRAF mutation and/or presence of normal methylation indicate the possible presence of a hereditary germline mutation (e.g. Lynch syndrome) and referral to genetic counseling is warranted.    08/05/2021 Initial Diagnosis   Endometrial adenocarcinoma (HCC)   08/21/2021 Pathology Results   FINAL MICROSCOPIC DIAGNOSIS:   A. LYMPH NODE, SENTINEL, RIGHT OBTURATOR, EXCISION:  - Lymph node, negative for carcinoma (0/1)   B. LYMPH NODE, SENTINEL, LEFT OBTURATOR, EXCISION:  - Lymph node, negative for carcinoma (0/1)   C. UTERUS, CERVIX, BILATERAL FALLOPIAN TUBES AND OVARIES:  - Endometrioid adenocarcinoma, FIGO grade 2  - Carcinoma invades through the entire myometrium and focally involves  the serosal surface  - Benign unremarkable cervix  - Benign bilateral fallopian tubes and ovaries  - See oncology table  - See comment   COMMENT:   C.  Tumor  showed areas with clear cell morphology but immunostains for Napsin A and P504S do not show evidence of clear cell carcinoma.  Also, there is significant tumor necrosis, but the viable portion of tumor appears to be FIGO grade 2.  Dr. Secundino Dach reviewed the select slides from the case and concurs with presence of serosal invasion.    ONCOLOGY TABLE:   UTERUS, CARCINOMA OR CARCINOSARCOMA: Resection   Procedure: Total hysterectomy and bilateral salpingo-oophorectomy  Histologic Type: Endometrioid adenocarcinoma  Histologic Grade: FIGO grade 1  Myometrial Invasion:       Depth of Myometrial Invasion (mm): 17 mm       Myometrial Thickness (mm): 17 mm       Percentage of Myometrial Invasion: 100%  Uterine Serosa Involvement: Present, focal  Cervical stromal Involvement: Not identified  Extent of involvement of other tissue/organs: Not identified  Peritoneal/Ascitic Fluid: Not applicable  Lymphovascular Invasion: Not identified  Regional Lymph Nodes:       Pelvic Lymph Nodes Examined:                                   2 Sentinel                                   0 Non-sentinel                                   2 Total       Pelvic Lymph Nodes with Metastasis:  0       Para-aortic Lymph Nodes Examined:                                    0 Sentinel                                    0 Non-sentinel                                    0 Total       Para-aortic Lymph Nodes with Metastasis: 0  Distant Metastasis:       Distant Site(s) Involved: Not applicable  Pathologic Stage Classification (pTNM, AJCC 8th Edition): pT3a, pN0  Ancillary Studies: MMR / MSI testing will be ordered  Representative Tumor Block: C10  Comment(s): Pancytokeratin was performed on the lymph nodes and is negative.    08/21/2021 Surgery   Surgeon: Diania Fortes    Operation: Robotic-assisted laparoscopic total hysterectomy with bilateral salpingoophorectomy, SLN biopsy     Operative Findings:  : 6cm uterus with  anterior fibroid (approx 4cm). Normal appearing tubes and ovaries. No suspicious nodes. Bilateral mapping.     10/03/2021 Cancer Staging   Staging form: Corpus Uteri - Carcinoma and Carcinosarcoma, AJCC 8th Edition - Clinical stage from 10/03/2021: FIGO Stage III (cT3, cN0, cM0) - Signed by Almeda Jacobs, MD on 10/03/2021 Stage prefix: Initial diagnosis   10/14/2021 - 01/20/2022 Chemotherapy   Patient is on Treatment Plan : Uterine Carboplatin  (AUC 5) 21d      11/20/2021 - 12/18/2021 Radiation Therapy   11/20/2021 through 12/18/2021 Site Technique Total Dose (Gy) Dose per Fx (Gy) Completed Fx Beam Energies  Vagina: Pelvis HDR-brachy 30/30 6 5/5 Ir-192      03/21/2022 Imaging   No evidence of recurrent or metastatic carcinoma within the abdomen or pelvis.   Colonic diverticulosis, without radiographic evidence of diverticulitis.   Stable small benign left adrenal adenoma.   Aortic Atherosclerosis (ICD10-I70.0).     Interval History: No significant change since her last visit.  Denies any vaginal bleeding.  Past Medical/Surgical History: Past Medical History:  Diagnosis Date   Arthritis    Complication of anesthesia    "years ago, hard to awaken, no with recent procedures."   Diverticulosis    Endometrial cancer (HCC)    Family history of colon cancer 10/15/2021   Gallstones    GERD (gastroesophageal reflux disease)    Headache 08/15/2021   none recent   History of radiation therapy    Endometrium- vcc brachytherapy 11/20/21-12/18/21- Dr. Retta Caster   HTN (hypertension)    Hyperlipidemia    Irritable bowel syndrome (IBS)    Osteoarthritis    Osteopenia    Osteoporosis    PMB (postmenopausal bleeding) 08/15/2021   Skin cancer    skin cancer -    Urinary frequency 08/15/2021   Wears glasses 08/15/2021    Past Surgical History:  Procedure Laterality Date   bilateral cataracts     COLONOSCOPY     INGUINAL HERNIA REPAIR Bilateral 09/13/2020   Procedure: LAPAROSCOPIC  BILATERAL INGUINAL HERNIA REPAIR WITH MESH;  Surgeon: Shela Derby, MD;  Location: [facility] OR;  Service: General;  Laterality: Bilateral;   ROBOTIC ASSISTED TOTAL HYSTERECTOMY WITH BILATERAL SALPINGO OOPHERECTOMY Bilateral 08/21/2021  Procedure: XI ROBOTIC ASSISTED TOTAL HYSTERECTOMY WITH BILATERAL SALPINGO OOPHORECTOMY;  Surgeon: Alphonso Aschoff, MD;  Location: [facility];  Service: Gynecology;  Laterality: Bilateral;   SENTINEL NODE BIOPSY N/A 08/21/2021   Procedure: SENTINEL NODE BIOPSY;  Surgeon: Alphonso Aschoff, MD;  Location: [facility];  Service: Gynecology;  Laterality: N/A;   uterine prolapse surgery      Family History  Problem Relation Age of Onset   Colon cancer Mother 46   Stroke Mother    Parkinson's disease Father    Dementia Father    Colon cancer Sister 41   Colon polyps Sister    Cancer Maternal Aunt        unknown type   Cancer Paternal Aunt        unknown type; ?intestinal   Coronary artery disease Other        female 1st degree <50   Cancer Cousin        maternal female cousins; x3; unknown type   Breast cancer Cousin        maternal female cousin   Esophageal cancer Neg Hx    Stomach cancer Neg Hx    Anesthesia problems Neg Hx    Endometrial cancer Neg Hx    Pancreatic cancer Neg Hx    Prostate cancer Neg Hx     Social History   Socioeconomic History   Marital status: Widowed    Spouse name: Not on file   Number of children: 1   Years of education: Not on file   Highest education level: Not on file  Occupational History   Occupation: retired  Tobacco Use   Smoking status: Never   Smokeless tobacco: Never  Vaping Use   Vaping status: Never Used  Substance and Sexual Activity   Alcohol use: No   Drug use: No   Sexual activity: Not Currently    Birth control/protection: None  Other Topics Concern   Not on file  Social History Narrative   One Boy    Daily Caffeine  - tea         Social Drivers of Health   Financial  Resource Strain: Low Risk  (09/24/2022)   Overall Financial Resource Strain (CARDIA)    Difficulty of Paying Living Expenses: Not hard at all  Food Insecurity: No Food Insecurity (09/24/2022)   Hunger Vital Sign    Worried About Running Out of Food in the Last Year: Never true    Ran Out of Food in the Last Year: Never true  Transportation Needs: No Transportation Needs (09/24/2022)   PRAPARE - Administrator, Civil Service (Medical): No    Lack of Transportation (Non-Medical): No  Physical Activity: Inactive (09/24/2022)   Exercise Vital Sign    Days of Exercise per Week: 0 days    Minutes of Exercise per Session: 0 min  Stress: No Stress Concern Present (09/24/2022)   [employer] of Occupational Health - Occupational Stress Questionnaire    Feeling of Stress : Not at all  Social Connections: Moderately Integrated (09/24/2022)   Social Connection and Isolation Panel [NHANES]    Frequency of Communication with Friends and Family: More than three times a week    Frequency of Social Gatherings with Friends and Family: More than three times a week    Attends Religious Services: More than 4 times per year    Active Member of [employer] or Organizations: Yes    Attends Banker Meetings: More than 4  times per year    Marital Status: Widowed    Current Medications:  Current Outpatient Medications:    acetaminophen  (TYLENOL ) 325 MG tablet, Take 352 mg by mouth every 6 (six) hours as needed for mild pain or moderate pain., Disp: , Rfl:    acyclovir  (ZOVIRAX ) 400 MG tablet, Take 1 tablet (400 mg total) by mouth 3 (three) times daily., Disp: 21 tablet, Rfl: 3   ALPRAZolam  (XANAX ) 1 MG tablet, TAKE 1 TABLET BY MOUTH AT BEDTIME AS NEEDED FOR ANXIETY., Disp: 90 tablet, Rfl: 1   amLODipine  (NORVASC ) 5 MG tablet, TAKE 1 TABLET (5 MG TOTAL) BY MOUTH DAILY., Disp: 90 tablet, Rfl: 3   captopril  (CAPOTEN ) 50 MG tablet, TAKE 1 TABLET BY MOUTH TWICE A DAY, Disp: 180 tablet, Rfl:  1   cephALEXin  (KEFLEX ) 500 MG capsule, TAKE 1 CAPSULE (500 MG TOTAL) BY MOUTH DAILY, Disp: 90 capsule, Rfl: 1   Cholecalciferol  1000 UNITS tablet, Take 1,000 Units by mouth daily. Vitamin d , Disp: , Rfl:    Coenzyme Q10 (CO Q-10) 100 MG CAPS, Take by mouth daily., Disp: , Rfl:    dicyclomine  (BENTYL ) 10 MG capsule, TAKE 1 CAPSULE (10 MG TOTAL) BY MOUTH 4 TIMES A DAY BEFORE MEALS AND AT BEDTIME, Disp: 90 capsule, Rfl: 3   ezetimibe  (ZETIA ) 10 MG tablet, TAKE 1 TABLET BY MOUTH EVERY DAY, Disp: 90 tablet, Rfl: 3   meloxicam  (MOBIC ) 7.5 MG tablet, Take 1 tablet (7.5 mg total) by mouth daily as needed for pain., Disp: 30 tablet, Rfl: 2   mirabegron  ER (MYRBETRIQ ) 50 MG TB24 tablet, Take 1 tablet (50 mg total) by mouth daily., Disp: 30 tablet, Rfl: 11   omeprazole  (PRILOSEC) 20 MG capsule, Take 1 capsule (20 mg total) by mouth 2 (two) times daily before a meal., Disp: 180 capsule, Rfl: 1   Pancrelipase , Lip-Prot-Amyl, (CREON ) 24000-76000 units CPEP, TAKE ONE CAPSULE BY MOUTH 3 TIMES A DAY WITH FOOD, Disp: 270 capsule, Rfl: 3   polyethylene glycol powder (GAVILAX) 17 GM/SCOOP powder, MIX 1 CAPFUL (17 GRAMS) AS DIRECTED AND DRINK TWICE A DAY AS NEEDED FOR MODERATE CONSTIPATION, Disp: 510 g, Rfl: 5   propranolol  ER (INDERAL  LA) 120 MG 24 hr capsule, TAKE 1 CAPSULE BY MOUTH EVERY DAY, Disp: 90 capsule, Rfl: 2   RESTASIS  0.05 % ophthalmic emulsion, Place 1 drop into both eyes 2 (two) times daily. , Disp: , Rfl:    traZODone  (DESYREL ) 50 MG tablet, TAKE 1-2 TABLETS BY MOUTH AT BEDTIME., Disp: 180 tablet, Rfl: 2   Trolamine LIQD, Use Blue Emu on sore joints 3 times a day., Disp: 226 mL, Rfl: 2   Vitamin A  2400 MCG (8000 UT) CAPS, Take 2,400 mcg by mouth daily., Disp: , Rfl:   Review of Systems: + leg swelling, problems with walking Denies appetite changes, fevers, chills, fatigue, unexplained weight changes. Denies hearing loss, neck lumps or masses, mouth sores, ringing in ears or voice changes. Denies  cough or wheezing.  Denies shortness of breath. Denies chest pain or palpitations.  Denies abdominal distention, pain, blood in stools, constipation, diarrhea, nausea, vomiting, or early satiety. Denies pain with intercourse, dysuria, frequency, hematuria or incontinence. Denies hot flashes, pelvic pain, vaginal bleeding or vaginal discharge.   Denies joint pain, back pain or muscle pain/cramps. Denies itching, rash, or wounds. Denies dizziness, headaches, numbness or seizures. Denies swollen lymph nodes or glands, denies easy bruising or bleeding. Denies anxiety, depression, confusion, or decreased concentration.  Physical Exam: BP (!) 145/87  Pulse 65   Temp 98.3 F (36.8 C)   Resp 18   Wt 136 lb 3.2 oz (61.8 kg)   SpO2 98%   BMI 26.60 kg/m  General: Alert, oriented, no acute distress.  GU: Normal appearing external genitalia without erythema, excoriation, or lesions.  Speculum exam reveals utterly atrophic vaginal mucosa.  The midportion of the vaginal cuff is normal in appearance now.  At the left apex, there is 1 area of hypopigmentation that measures approximately 1 cm that still appears to be hemosiderin stained from the Lugol's.  On the right, there is a cobblestone pattern with 4-5 smaller hypopigmented hemosiderin stained lesions at the apex.  No friability.  On bimanual exam, the cuff is smooth with no significant nodularity.  Laboratory & Radiologic Studies: CT A/P on 04/13/24:  No evidence for metastatic disease within the abdomen or pelvis.   Assessment & Plan: Sophia Santos is a 89 y.o. 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  Completed adjuvant chemotherapy and VBT in 01/2022. Post-treatment imaging in 03/2022 negative for evidence of disease. Most recent CT in 03/2024 negative for recurrence. Recent vaginal cuff biopsy in 12/2023 negative for recurrence.  Reviewed recent CT scan, which is  negative for evidence of recurrent disease.  Vaginal exam has improved significantly from the last exam but still has cobblestone appearance of bilateral vaginal apices.  Still has the appearance that it is stained by Monsel's.  Discussed biopsy today, which the patient would prefer to avoid.  Offered that we could see her back in 6 weeks.  If no significant change in her exam at that time, we will strongly recommend biopsy.  If exam has improved, then can continue with close monitoring.   16 minutes of total time was spent for this patient encounter, including preparation, face-to-face counseling with the patient and coordination of care, and documentation of the encounter.  Wiley Hanger, MD  Division of Gynecologic Oncology  Department of Obstetrics and Gynecology  [facility]

## 2024-04-28 NOTE — Patient Instructions (Signed)
 We will see you back in 6 weeks for another exam.  If findings have not improved at that time, plan will be for vaginal biopsy.

## 2024-05-03 DIAGNOSIS — L814 Other melanin hyperpigmentation: Secondary | ICD-10-CM | POA: Diagnosis not present

## 2024-05-03 DIAGNOSIS — L821 Other seborrheic keratosis: Secondary | ICD-10-CM | POA: Diagnosis not present

## 2024-05-03 DIAGNOSIS — D1801 Hemangioma of skin and subcutaneous tissue: Secondary | ICD-10-CM | POA: Diagnosis not present

## 2024-05-03 DIAGNOSIS — D485 Neoplasm of uncertain behavior of skin: Secondary | ICD-10-CM | POA: Diagnosis not present

## 2024-05-03 DIAGNOSIS — C44329 Squamous cell carcinoma of skin of other parts of face: Secondary | ICD-10-CM | POA: Diagnosis not present

## 2024-05-03 DIAGNOSIS — Z85828 Personal history of other malignant neoplasm of skin: Secondary | ICD-10-CM | POA: Diagnosis not present

## 2024-05-03 DIAGNOSIS — L570 Actinic keratosis: Secondary | ICD-10-CM | POA: Diagnosis not present

## 2024-05-03 DIAGNOSIS — L578 Other skin changes due to chronic exposure to nonionizing radiation: Secondary | ICD-10-CM | POA: Diagnosis not present

## 2024-05-04 DIAGNOSIS — K589 Irritable bowel syndrome without diarrhea: Secondary | ICD-10-CM | POA: Diagnosis not present

## 2024-05-04 DIAGNOSIS — G8929 Other chronic pain: Secondary | ICD-10-CM | POA: Diagnosis not present

## 2024-05-04 DIAGNOSIS — M5442 Lumbago with sciatica, left side: Secondary | ICD-10-CM | POA: Diagnosis not present

## 2024-05-04 DIAGNOSIS — D630 Anemia in neoplastic disease: Secondary | ICD-10-CM | POA: Diagnosis not present

## 2024-05-04 DIAGNOSIS — Z791 Long term (current) use of non-steroidal anti-inflammatories (NSAID): Secondary | ICD-10-CM | POA: Diagnosis not present

## 2024-05-04 DIAGNOSIS — K219 Gastro-esophageal reflux disease without esophagitis: Secondary | ICD-10-CM | POA: Diagnosis not present

## 2024-05-04 DIAGNOSIS — M1990 Unspecified osteoarthritis, unspecified site: Secondary | ICD-10-CM | POA: Diagnosis not present

## 2024-05-04 DIAGNOSIS — I10 Essential (primary) hypertension: Secondary | ICD-10-CM | POA: Diagnosis not present

## 2024-05-04 DIAGNOSIS — Z792 Long term (current) use of antibiotics: Secondary | ICD-10-CM | POA: Diagnosis not present

## 2024-05-04 DIAGNOSIS — Z556 Problems related to health literacy: Secondary | ICD-10-CM | POA: Diagnosis not present

## 2024-05-13 DIAGNOSIS — Z556 Problems related to health literacy: Secondary | ICD-10-CM | POA: Diagnosis not present

## 2024-05-13 DIAGNOSIS — K219 Gastro-esophageal reflux disease without esophagitis: Secondary | ICD-10-CM | POA: Diagnosis not present

## 2024-05-13 DIAGNOSIS — Z792 Long term (current) use of antibiotics: Secondary | ICD-10-CM | POA: Diagnosis not present

## 2024-05-13 DIAGNOSIS — M1990 Unspecified osteoarthritis, unspecified site: Secondary | ICD-10-CM | POA: Diagnosis not present

## 2024-05-13 DIAGNOSIS — D630 Anemia in neoplastic disease: Secondary | ICD-10-CM | POA: Diagnosis not present

## 2024-05-13 DIAGNOSIS — Z791 Long term (current) use of non-steroidal anti-inflammatories (NSAID): Secondary | ICD-10-CM | POA: Diagnosis not present

## 2024-05-13 DIAGNOSIS — M5442 Lumbago with sciatica, left side: Secondary | ICD-10-CM | POA: Diagnosis not present

## 2024-05-13 DIAGNOSIS — K589 Irritable bowel syndrome without diarrhea: Secondary | ICD-10-CM | POA: Diagnosis not present

## 2024-05-13 DIAGNOSIS — G8929 Other chronic pain: Secondary | ICD-10-CM | POA: Diagnosis not present

## 2024-05-13 DIAGNOSIS — I10 Essential (primary) hypertension: Secondary | ICD-10-CM | POA: Diagnosis not present

## 2024-05-18 DIAGNOSIS — K219 Gastro-esophageal reflux disease without esophagitis: Secondary | ICD-10-CM | POA: Diagnosis not present

## 2024-05-18 DIAGNOSIS — G8929 Other chronic pain: Secondary | ICD-10-CM | POA: Diagnosis not present

## 2024-05-18 DIAGNOSIS — M1990 Unspecified osteoarthritis, unspecified site: Secondary | ICD-10-CM | POA: Diagnosis not present

## 2024-05-18 DIAGNOSIS — D630 Anemia in neoplastic disease: Secondary | ICD-10-CM | POA: Diagnosis not present

## 2024-05-18 DIAGNOSIS — Z791 Long term (current) use of non-steroidal anti-inflammatories (NSAID): Secondary | ICD-10-CM | POA: Diagnosis not present

## 2024-05-18 DIAGNOSIS — Z556 Problems related to health literacy: Secondary | ICD-10-CM | POA: Diagnosis not present

## 2024-05-18 DIAGNOSIS — I10 Essential (primary) hypertension: Secondary | ICD-10-CM | POA: Diagnosis not present

## 2024-05-18 DIAGNOSIS — Z792 Long term (current) use of antibiotics: Secondary | ICD-10-CM | POA: Diagnosis not present

## 2024-05-18 DIAGNOSIS — M5442 Lumbago with sciatica, left side: Secondary | ICD-10-CM | POA: Diagnosis not present

## 2024-05-18 DIAGNOSIS — K589 Irritable bowel syndrome without diarrhea: Secondary | ICD-10-CM | POA: Diagnosis not present

## 2024-05-25 DIAGNOSIS — Z791 Long term (current) use of non-steroidal anti-inflammatories (NSAID): Secondary | ICD-10-CM | POA: Diagnosis not present

## 2024-05-25 DIAGNOSIS — Z556 Problems related to health literacy: Secondary | ICD-10-CM | POA: Diagnosis not present

## 2024-05-25 DIAGNOSIS — D630 Anemia in neoplastic disease: Secondary | ICD-10-CM | POA: Diagnosis not present

## 2024-05-25 DIAGNOSIS — K219 Gastro-esophageal reflux disease without esophagitis: Secondary | ICD-10-CM | POA: Diagnosis not present

## 2024-05-25 DIAGNOSIS — Z792 Long term (current) use of antibiotics: Secondary | ICD-10-CM | POA: Diagnosis not present

## 2024-05-25 DIAGNOSIS — M5442 Lumbago with sciatica, left side: Secondary | ICD-10-CM | POA: Diagnosis not present

## 2024-05-25 DIAGNOSIS — K589 Irritable bowel syndrome without diarrhea: Secondary | ICD-10-CM | POA: Diagnosis not present

## 2024-05-25 DIAGNOSIS — M1990 Unspecified osteoarthritis, unspecified site: Secondary | ICD-10-CM | POA: Diagnosis not present

## 2024-05-25 DIAGNOSIS — G8929 Other chronic pain: Secondary | ICD-10-CM | POA: Diagnosis not present

## 2024-05-25 DIAGNOSIS — I10 Essential (primary) hypertension: Secondary | ICD-10-CM | POA: Diagnosis not present

## 2024-05-31 ENCOUNTER — Ambulatory Visit (INDEPENDENT_AMBULATORY_CARE_PROVIDER_SITE_OTHER): Admitting: Internal Medicine

## 2024-05-31 ENCOUNTER — Encounter: Payer: Self-pay | Admitting: Internal Medicine

## 2024-05-31 VITALS — BP 140/85 | HR 71 | Temp 97.8°F | Wt 134.0 lb

## 2024-05-31 DIAGNOSIS — G8929 Other chronic pain: Secondary | ICD-10-CM | POA: Diagnosis not present

## 2024-05-31 DIAGNOSIS — M5442 Lumbago with sciatica, left side: Secondary | ICD-10-CM | POA: Diagnosis not present

## 2024-05-31 DIAGNOSIS — R296 Repeated falls: Secondary | ICD-10-CM | POA: Diagnosis not present

## 2024-05-31 DIAGNOSIS — G4700 Insomnia, unspecified: Secondary | ICD-10-CM

## 2024-05-31 DIAGNOSIS — C4491 Basal cell carcinoma of skin, unspecified: Secondary | ICD-10-CM | POA: Insufficient documentation

## 2024-05-31 DIAGNOSIS — I10 Essential (primary) hypertension: Secondary | ICD-10-CM | POA: Diagnosis not present

## 2024-05-31 DIAGNOSIS — C44310 Basal cell carcinoma of skin of unspecified parts of face: Secondary | ICD-10-CM

## 2024-05-31 DIAGNOSIS — F411 Generalized anxiety disorder: Secondary | ICD-10-CM | POA: Diagnosis not present

## 2024-05-31 MED ORDER — TRAZODONE HCL 50 MG PO TABS: 50.0000 mg | ORAL_TABLET | Freq: Every day | ORAL | 3 refills | Status: AC

## 2024-05-31 MED ORDER — OMEPRAZOLE 20 MG PO CPDR: 20.0000 mg | DELAYED_RELEASE_CAPSULE | Freq: Two times a day (BID) | ORAL | 1 refills | Status: AC

## 2024-05-31 MED ORDER — ALPRAZOLAM 1 MG PO TABS: ORAL_TABLET | ORAL | 1 refills | Status: DC

## 2024-05-31 NOTE — Assessment & Plan Note (Signed)
In PT 

## 2024-05-31 NOTE — Assessment & Plan Note (Signed)
 [facility] Skin surgery appt is pending

## 2024-05-31 NOTE — Progress Notes (Signed)
 Subjective:  [name], female    DOB: August 02, 1934  Age: 89 y.o. MRN: [id number]  CC: Medical Management of Chronic Issues (3 month follow up Pt states having bilateral knee and legs pain. A month ago her biopsy show skin cancer she left a vm with Dermatology but no one has call her back would like us  to look into it for her.)   HPI [name] presents for leg pains, R temple skin cancer, urgency  Outpatient Medications Prior to Visit  Medication Sig Dispense Refill   acetaminophen  (TYLENOL ) 325 MG tablet Take 352 mg by mouth every 6 (six) hours as needed for mild pain or moderate pain.     amLODipine  (NORVASC ) 5 MG tablet TAKE 1 TABLET (5 MG TOTAL) BY MOUTH DAILY. 90 tablet 3   captopril  (CAPOTEN ) 50 MG tablet TAKE 1 TABLET BY MOUTH TWICE A DAY 180 tablet 1   cephALEXin  (KEFLEX ) 500 MG capsule TAKE 1 CAPSULE (500 MG TOTAL) BY MOUTH DAILY 90 capsule 1   Cholecalciferol  1000 UNITS tablet Take 1,000 Units by mouth daily. Vitamin d      Coenzyme Q10 (CO Q-10) 100 MG CAPS Take by mouth daily.     dicyclomine  (BENTYL ) 10 MG capsule TAKE 1 CAPSULE (10 MG TOTAL) BY MOUTH 4 TIMES A DAY BEFORE MEALS AND AT BEDTIME 90 capsule 3   ezetimibe  (ZETIA ) 10 MG tablet TAKE 1 TABLET BY MOUTH EVERY DAY 90 tablet 3   meloxicam  (MOBIC ) 7.5 MG tablet Take 1 tablet (7.5 mg total) by mouth daily as needed for pain. 30 tablet 2   Pancrelipase , Lip-Prot-Amyl, (CREON ) 24000-76000 units CPEP TAKE ONE CAPSULE BY MOUTH 3 TIMES A DAY WITH FOOD 270 capsule 3   polyethylene glycol powder (GAVILAX) 17 GM/SCOOP powder MIX 1 CAPFUL (17 GRAMS) AS DIRECTED AND DRINK TWICE A DAY AS NEEDED FOR MODERATE CONSTIPATION 510 g 5   propranolol  ER (INDERAL  LA) 120 MG 24 hr capsule TAKE 1 CAPSULE BY MOUTH EVERY DAY 90 capsule 2   RESTASIS  0.05 % ophthalmic emulsion Place 1 drop into both eyes 2 (two) times daily.      Trolamine LIQD Use Blue Emu on sore joints 3 times a day. 226 mL 2   Vitamin A  2400 MCG (8000 UT) CAPS Take  2,400 mcg by mouth daily.     ALPRAZolam  (XANAX ) 1 MG tablet TAKE 1 TABLET BY MOUTH AT BEDTIME AS NEEDED FOR ANXIETY. 90 tablet 1   omeprazole  (PRILOSEC) 20 MG capsule Take 1 capsule (20 mg total) by mouth 2 (two) times daily before a meal. 180 capsule 1   traZODone  (DESYREL ) 50 MG tablet TAKE 1-2 TABLETS BY MOUTH AT BEDTIME. 180 tablet 2   acyclovir  (ZOVIRAX ) 400 MG tablet Take 1 tablet (400 mg total) by mouth 3 (three) times daily. (Patient not taking: Reported on 05/31/2024) 21 tablet 3   mirabegron  ER (MYRBETRIQ ) 50 MG TB24 tablet Take 1 tablet (50 mg total) by mouth daily. (Patient not taking: Reported on 05/31/2024) 30 tablet 11   No facility-administered medications prior to visit.    ROS: Review of Systems  Constitutional:  Positive for fatigue. Negative for activity change, appetite change, chills and unexpected weight change.  HENT:  Negative for congestion, mouth sores and sinus pressure.   Eyes:  Negative for visual disturbance.  Respiratory:  Negative for cough and chest tightness.   Gastrointestinal:  Negative for abdominal pain and nausea.  Genitourinary:  Positive for urgency. Negative for difficulty urinating, frequency and  vaginal pain.  Musculoskeletal:  Positive for arthralgias, back pain and gait problem.  Skin:  Negative for pallor and rash.  Neurological:  Negative for dizziness, tremors, weakness, numbness and headaches.  Psychiatric/Behavioral:  Positive for decreased concentration. Negative for confusion, sleep disturbance and suicidal ideas.     Objective:  BP (!) 140/85   Pulse 71   Temp 97.8 F (36.6 C) (Oral)   Wt 134 lb (60.8 kg)   SpO2 95%   BMI 26.17 kg/m   BP Readings from Last 3 Encounters:  05/31/24 (!) 140/85  04/28/24 (!) 145/87  04/01/24 132/82    Wt Readings from Last 3 Encounters:  05/31/24 134 lb (60.8 kg)  04/28/24 136 lb 3.2 oz (61.8 kg)  04/01/24 134 lb 12.8 oz (61.1 kg)    Physical Exam Constitutional:      General: She is  not in acute distress.    Appearance: Normal appearance. She is well-developed.  HENT:     Head: Normocephalic.     Right Ear: External ear normal.     Left Ear: External ear normal.     Nose: Nose normal.   Eyes:     General:        Right eye: No discharge.        Left eye: No discharge.     Conjunctiva/sclera: Conjunctivae normal.     Pupils: Pupils are equal, round, and reactive to light.   Neck:     Thyroid : No thyromegaly.     Vascular: No JVD.     Trachea: No tracheal deviation.   Cardiovascular:     Rate and Rhythm: Normal rate and regular rhythm.     Heart sounds: Normal heart sounds.  Pulmonary:     Effort: No respiratory distress.     Breath sounds: No stridor. No wheezing.  Abdominal:     General: Bowel sounds are normal. There is no distension.     Palpations: Abdomen is soft. There is no mass.     Tenderness: There is no abdominal tenderness. There is no guarding or rebound.   Musculoskeletal:        General: Tenderness present.     Cervical back: Normal range of motion and neck supple. No rigidity.     Right lower leg: No edema.     Left lower leg: No edema.  Lymphadenopathy:     Cervical: No cervical adenopathy.   Skin:    Findings: No erythema or rash.   Neurological:     Cranial Nerves: No cranial nerve deficit.     Motor: No abnormal muscle tone.     Coordination: Coordination normal.     Gait: Gait abnormal.     Deep Tendon Reflexes: Reflexes normal.   Psychiatric:        Behavior: Behavior normal.        Thought Content: Thought content normal.        Judgment: Judgment normal.    Large skin cancer on R temple   Lab Results  Component Value Date   WBC 5.0 02/29/2024   HGB 12.9 02/29/2024   HCT 37.7 02/29/2024   PLT 219.0 02/29/2024   GLUCOSE 114 (H) 02/29/2024   CHOL 238 (H) 05/06/2021   TRIG 106.0 05/06/2021   HDL 65.10 05/06/2021   LDLDIRECT 143.0 03/02/2018   LDLCALC 152 (H) 05/06/2021   ALT 13 02/29/2024   AST 15  02/29/2024   NA 137 02/29/2024   K 3.8 02/29/2024   CL 101 02/29/2024  CREATININE 0.69 02/29/2024   BUN 16 02/29/2024   CO2 29 02/29/2024   TSH 1.12 02/29/2024   INR 1.0 02/23/2020   HGBA1C 5.9 09/29/2007    CT ABDOMEN PELVIS W CONTRAST Result Date: 04/13/2024 EXAMINATION: CT ABDOMEN PELVIS W CONTRAST CLINICAL INDICATION: Female, 89 years old. Genital cancer, monitor; vaginal cuff changes, recent biopsy in 12/2023 negative for cancer TECHNIQUE: Axial CT of the abdomen and pelvis with 100 cc Omnipaque  300 intravenous contrast. Multiplanar reformations provided. Unless otherwise specified, incidental thyroid , adrenal, renal lesions do not require dedicated imaging follow up. Additionally, any mentioned pulmonary nodules do not require dedicated imaging follow-up based on the Fleischner guidelines unless otherwise specified. Coronary calcifications are not identified unless otherwise specified. COMPARISON: 10/16/2023 FINDINGS: The lung bases are clear. The heart is normal in size. There are coronary calcifications. The liver appears normal. There is cholelithiasis. The spleen is normal. Pancreas is normal. The right adrenal is normal. Stable left adrenal nodule suggestive of an adenoma. The right kidney is normal. Left renal cyst is seen. The abdominal aorta is normal in caliber. Scattered atherosclerotic changes are present. The bladder is normal. Uterus is surgically absent. There is colonic diverticulosis. Large and small bowel loops are otherwise within normal limits. There is no free fluid or suspicious lymphadenopathy. No obvious pelvic mass. No concerning osseous lesions. There is diffuse osseous demineralization with degenerative changes of the spine and bony pelvis. IMPRESSION: No evidence for metastatic disease within the abdomen or pelvis. DOSE REDUCTION: This exam was performed according to our departmental dose-optimization program which includes automated exposure control, adjustment of the mA  and/or kV according to patient size and/or use of iterative reconstruction technique. Electronically signed by: [place] Engel MD 04/13/2024 04:08 PM EDT RP Workstation: [id number]    Assessment & Plan:   Problem List Items Addressed This Visit     Anxiety disorder - Primary   Chronic  Cont on Alprazolam  prn  Potential benefits of a long term benzodiazepines  use as well as potential risks  and complications were explained to the patient and were aknowledged.       Relevant Medications   ALPRAZolam  (XANAX ) 1 MG tablet   traZODone  (DESYREL ) 50 MG tablet   Essential hypertension   Chronic Cont on Captopril  Risks associated with treatment noncompliance were discussed. Compliance was encouraged.       Low back pain   Start home PT      Insomnia   Alprazolam  prn  Potential benefits of a long term benzodiazepines  use as well as potential risks  and complications were explained to the patient and were aknowledged.      Falls   In PT      Basal cell carcinoma   R temple Skin surgery appt is pending      Relevant Medications   ALPRAZolam  (XANAX ) 1 MG tablet      Meds ordered this encounter  Medications   ALPRAZolam  (XANAX ) 1 MG tablet    Sig: TAKE 1 TABLET BY MOUTH AT BEDTIME AS NEEDED FOR ANXIETY.    Dispense:  90 tablet    Refill:  1   omeprazole  (PRILOSEC) 20 MG capsule    Sig: Take 1 capsule (20 mg total) by mouth 2 (two) times daily before a meal.    Dispense:  180 capsule    Refill:  1   traZODone  (DESYREL ) 50 MG tablet    Sig: Take 1-2 tablets (50-100 mg total) by mouth at bedtime.    Dispense:  180 tablet    Refill:  3      Follow-up: Return in about 3 months (around 08/31/2024) for a follow-up visit.  Anitra Barn, MD

## 2024-05-31 NOTE — Assessment & Plan Note (Signed)
 Chronic Cont on Captopril Risks associated with treatment noncompliance were discussed. Compliance was encouraged.

## 2024-05-31 NOTE — Assessment & Plan Note (Signed)
Chronic Cont on Alprazolam prn  Potential benefits of a long term benzodiazepines  use as well as potential risks  and complications were explained to the patient and were aknowledged.

## 2024-05-31 NOTE — Assessment & Plan Note (Signed)
Start home PT

## 2024-05-31 NOTE — Assessment & Plan Note (Signed)
Alprazolam prn  Potential benefits of a long term benzodiazepines  use as well as potential risks  and complications were explained to the patient and were aknowledged. 

## 2024-06-01 DIAGNOSIS — Z791 Long term (current) use of non-steroidal anti-inflammatories (NSAID): Secondary | ICD-10-CM | POA: Diagnosis not present

## 2024-06-01 DIAGNOSIS — D630 Anemia in neoplastic disease: Secondary | ICD-10-CM | POA: Diagnosis not present

## 2024-06-01 DIAGNOSIS — Z556 Problems related to health literacy: Secondary | ICD-10-CM | POA: Diagnosis not present

## 2024-06-01 DIAGNOSIS — M1990 Unspecified osteoarthritis, unspecified site: Secondary | ICD-10-CM | POA: Diagnosis not present

## 2024-06-01 DIAGNOSIS — I10 Essential (primary) hypertension: Secondary | ICD-10-CM | POA: Diagnosis not present

## 2024-06-01 DIAGNOSIS — K589 Irritable bowel syndrome without diarrhea: Secondary | ICD-10-CM | POA: Diagnosis not present

## 2024-06-01 DIAGNOSIS — Z602 Problems related to living alone: Secondary | ICD-10-CM | POA: Diagnosis not present

## 2024-06-01 DIAGNOSIS — M5442 Lumbago with sciatica, left side: Secondary | ICD-10-CM | POA: Diagnosis not present

## 2024-06-01 DIAGNOSIS — K219 Gastro-esophageal reflux disease without esophagitis: Secondary | ICD-10-CM | POA: Diagnosis not present

## 2024-06-01 DIAGNOSIS — G8929 Other chronic pain: Secondary | ICD-10-CM | POA: Diagnosis not present

## 2024-06-03 DIAGNOSIS — M1990 Unspecified osteoarthritis, unspecified site: Secondary | ICD-10-CM | POA: Diagnosis not present

## 2024-06-03 DIAGNOSIS — C541 Malignant neoplasm of endometrium: Secondary | ICD-10-CM | POA: Diagnosis not present

## 2024-06-03 DIAGNOSIS — K589 Irritable bowel syndrome without diarrhea: Secondary | ICD-10-CM | POA: Diagnosis not present

## 2024-06-03 DIAGNOSIS — I10 Essential (primary) hypertension: Secondary | ICD-10-CM | POA: Diagnosis not present

## 2024-06-03 DIAGNOSIS — G8929 Other chronic pain: Secondary | ICD-10-CM | POA: Diagnosis not present

## 2024-06-03 DIAGNOSIS — Z556 Problems related to health literacy: Secondary | ICD-10-CM | POA: Diagnosis not present

## 2024-06-03 DIAGNOSIS — Z602 Problems related to living alone: Secondary | ICD-10-CM | POA: Diagnosis not present

## 2024-06-03 DIAGNOSIS — F411 Generalized anxiety disorder: Secondary | ICD-10-CM

## 2024-06-03 DIAGNOSIS — K219 Gastro-esophageal reflux disease without esophagitis: Secondary | ICD-10-CM | POA: Diagnosis not present

## 2024-06-03 DIAGNOSIS — D630 Anemia in neoplastic disease: Secondary | ICD-10-CM | POA: Diagnosis not present

## 2024-06-03 DIAGNOSIS — Z791 Long term (current) use of non-steroidal anti-inflammatories (NSAID): Secondary | ICD-10-CM | POA: Diagnosis not present

## 2024-06-03 DIAGNOSIS — M5442 Lumbago with sciatica, left side: Secondary | ICD-10-CM | POA: Diagnosis not present

## 2024-06-04 ENCOUNTER — Other Ambulatory Visit: Payer: Self-pay | Admitting: Internal Medicine

## 2024-06-10 DIAGNOSIS — I10 Essential (primary) hypertension: Secondary | ICD-10-CM | POA: Diagnosis not present

## 2024-06-10 DIAGNOSIS — M5442 Lumbago with sciatica, left side: Secondary | ICD-10-CM | POA: Diagnosis not present

## 2024-06-10 DIAGNOSIS — D630 Anemia in neoplastic disease: Secondary | ICD-10-CM | POA: Diagnosis not present

## 2024-06-10 DIAGNOSIS — G8929 Other chronic pain: Secondary | ICD-10-CM | POA: Diagnosis not present

## 2024-06-15 DIAGNOSIS — I96 Gangrene, not elsewhere classified: Secondary | ICD-10-CM | POA: Diagnosis not present

## 2024-06-15 DIAGNOSIS — L089 Local infection of the skin and subcutaneous tissue, unspecified: Secondary | ICD-10-CM | POA: Diagnosis not present

## 2024-06-15 DIAGNOSIS — C44329 Squamous cell carcinoma of skin of other parts of face: Secondary | ICD-10-CM | POA: Diagnosis not present

## 2024-06-17 DIAGNOSIS — D630 Anemia in neoplastic disease: Secondary | ICD-10-CM | POA: Diagnosis not present

## 2024-06-17 DIAGNOSIS — Z791 Long term (current) use of non-steroidal anti-inflammatories (NSAID): Secondary | ICD-10-CM | POA: Diagnosis not present

## 2024-06-17 DIAGNOSIS — M5442 Lumbago with sciatica, left side: Secondary | ICD-10-CM | POA: Diagnosis not present

## 2024-06-17 DIAGNOSIS — Z556 Problems related to health literacy: Secondary | ICD-10-CM | POA: Diagnosis not present

## 2024-06-17 DIAGNOSIS — Z602 Problems related to living alone: Secondary | ICD-10-CM | POA: Diagnosis not present

## 2024-06-17 DIAGNOSIS — K589 Irritable bowel syndrome without diarrhea: Secondary | ICD-10-CM | POA: Diagnosis not present

## 2024-06-17 DIAGNOSIS — G8929 Other chronic pain: Secondary | ICD-10-CM | POA: Diagnosis not present

## 2024-06-17 DIAGNOSIS — K219 Gastro-esophageal reflux disease without esophagitis: Secondary | ICD-10-CM | POA: Diagnosis not present

## 2024-06-17 DIAGNOSIS — M1990 Unspecified osteoarthritis, unspecified site: Secondary | ICD-10-CM | POA: Diagnosis not present

## 2024-06-17 DIAGNOSIS — I10 Essential (primary) hypertension: Secondary | ICD-10-CM | POA: Diagnosis not present

## 2024-06-20 ENCOUNTER — Telehealth: Payer: Self-pay

## 2024-06-20 NOTE — Telephone Encounter (Signed)
 Spoke with patient and she stated that she needed to reschedule her appointment from 7/10.Sophia Santos rescheduled for 8/19 at 2pm.. patient confirmed.

## 2024-06-20 NOTE — Telephone Encounter (Signed)
 Office received a voicemail from Ms.Dahm stating she needs to reschedule the upcoming appointment on Thursday 7/10 with Eleanor Epps NP.   LVM with Elveria, pt's daughter in law, to call office to reschedule appointment.

## 2024-06-20 NOTE — Telephone Encounter (Signed)
 Melissa, does this appointment have to be on a date Dr.Tucker is here?

## 2024-06-21 ENCOUNTER — Telehealth: Payer: Self-pay

## 2024-06-21 NOTE — Telephone Encounter (Signed)
 Spoke with patient granddaughter.SABRA asking to have patient appointment moved from 8/19 up to 7/9... patient declined.

## 2024-06-22 DIAGNOSIS — I10 Essential (primary) hypertension: Secondary | ICD-10-CM | POA: Diagnosis not present

## 2024-06-22 DIAGNOSIS — Z602 Problems related to living alone: Secondary | ICD-10-CM | POA: Diagnosis not present

## 2024-06-22 DIAGNOSIS — G8929 Other chronic pain: Secondary | ICD-10-CM | POA: Diagnosis not present

## 2024-06-22 DIAGNOSIS — M1990 Unspecified osteoarthritis, unspecified site: Secondary | ICD-10-CM | POA: Diagnosis not present

## 2024-06-22 DIAGNOSIS — K219 Gastro-esophageal reflux disease without esophagitis: Secondary | ICD-10-CM | POA: Diagnosis not present

## 2024-06-22 DIAGNOSIS — Z791 Long term (current) use of non-steroidal anti-inflammatories (NSAID): Secondary | ICD-10-CM | POA: Diagnosis not present

## 2024-06-22 DIAGNOSIS — K589 Irritable bowel syndrome without diarrhea: Secondary | ICD-10-CM | POA: Diagnosis not present

## 2024-06-22 DIAGNOSIS — Z556 Problems related to health literacy: Secondary | ICD-10-CM | POA: Diagnosis not present

## 2024-06-22 DIAGNOSIS — D630 Anemia in neoplastic disease: Secondary | ICD-10-CM | POA: Diagnosis not present

## 2024-06-22 DIAGNOSIS — M5442 Lumbago with sciatica, left side: Secondary | ICD-10-CM | POA: Diagnosis not present

## 2024-06-23 ENCOUNTER — Ambulatory Visit: Admitting: Gynecologic Oncology

## 2024-06-29 DIAGNOSIS — Z791 Long term (current) use of non-steroidal anti-inflammatories (NSAID): Secondary | ICD-10-CM | POA: Diagnosis not present

## 2024-06-29 DIAGNOSIS — K219 Gastro-esophageal reflux disease without esophagitis: Secondary | ICD-10-CM | POA: Diagnosis not present

## 2024-06-29 DIAGNOSIS — M1990 Unspecified osteoarthritis, unspecified site: Secondary | ICD-10-CM | POA: Diagnosis not present

## 2024-06-29 DIAGNOSIS — M5442 Lumbago with sciatica, left side: Secondary | ICD-10-CM | POA: Diagnosis not present

## 2024-06-29 DIAGNOSIS — Z556 Problems related to health literacy: Secondary | ICD-10-CM | POA: Diagnosis not present

## 2024-06-29 DIAGNOSIS — Z602 Problems related to living alone: Secondary | ICD-10-CM | POA: Diagnosis not present

## 2024-06-29 DIAGNOSIS — G8929 Other chronic pain: Secondary | ICD-10-CM | POA: Diagnosis not present

## 2024-06-29 DIAGNOSIS — K589 Irritable bowel syndrome without diarrhea: Secondary | ICD-10-CM | POA: Diagnosis not present

## 2024-06-29 DIAGNOSIS — D630 Anemia in neoplastic disease: Secondary | ICD-10-CM | POA: Diagnosis not present

## 2024-06-29 DIAGNOSIS — I10 Essential (primary) hypertension: Secondary | ICD-10-CM | POA: Diagnosis not present

## 2024-07-04 ENCOUNTER — Other Ambulatory Visit: Payer: Self-pay | Admitting: Internal Medicine

## 2024-07-08 DIAGNOSIS — I10 Essential (primary) hypertension: Secondary | ICD-10-CM | POA: Diagnosis not present

## 2024-07-08 DIAGNOSIS — K219 Gastro-esophageal reflux disease without esophagitis: Secondary | ICD-10-CM | POA: Diagnosis not present

## 2024-07-08 DIAGNOSIS — Z791 Long term (current) use of non-steroidal anti-inflammatories (NSAID): Secondary | ICD-10-CM | POA: Diagnosis not present

## 2024-07-08 DIAGNOSIS — Z556 Problems related to health literacy: Secondary | ICD-10-CM | POA: Diagnosis not present

## 2024-07-08 DIAGNOSIS — D630 Anemia in neoplastic disease: Secondary | ICD-10-CM | POA: Diagnosis not present

## 2024-07-08 DIAGNOSIS — M5442 Lumbago with sciatica, left side: Secondary | ICD-10-CM | POA: Diagnosis not present

## 2024-07-08 DIAGNOSIS — Z602 Problems related to living alone: Secondary | ICD-10-CM | POA: Diagnosis not present

## 2024-07-08 DIAGNOSIS — G8929 Other chronic pain: Secondary | ICD-10-CM | POA: Diagnosis not present

## 2024-07-08 DIAGNOSIS — K589 Irritable bowel syndrome without diarrhea: Secondary | ICD-10-CM | POA: Diagnosis not present

## 2024-07-08 DIAGNOSIS — M1990 Unspecified osteoarthritis, unspecified site: Secondary | ICD-10-CM | POA: Diagnosis not present

## 2024-07-11 ENCOUNTER — Encounter: Payer: Self-pay | Admitting: Radiation Oncology

## 2024-07-11 ENCOUNTER — Ambulatory Visit
Admission: RE | Admit: 2024-07-11 | Discharge: 2024-07-11 | Disposition: A | Discharge status: Home / Self Care | Payer: Self-pay | Source: Ambulatory Visit | Attending: Radiation Oncology | Admitting: Radiation Oncology

## 2024-07-11 VITALS — BP 128/98 | HR 105 | Temp 97.5°F | Resp 18 | Ht 60.0 in | Wt 134.2 lb

## 2024-07-11 DIAGNOSIS — Z923 Personal history of irradiation: Secondary | ICD-10-CM | POA: Diagnosis not present

## 2024-07-11 DIAGNOSIS — Z8542 Personal history of malignant neoplasm of other parts of uterus: Secondary | ICD-10-CM | POA: Insufficient documentation

## 2024-07-11 DIAGNOSIS — R102 Pelvic and perineal pain: Secondary | ICD-10-CM | POA: Diagnosis not present

## 2024-07-11 DIAGNOSIS — Z79899 Other long term (current) drug therapy: Secondary | ICD-10-CM | POA: Insufficient documentation

## 2024-07-11 DIAGNOSIS — C541 Malignant neoplasm of endometrium: Secondary | ICD-10-CM

## 2024-07-11 NOTE — Progress Notes (Signed)
 Radiation Oncology         [phone] ________________________________  Name: Sophia Santos MRN: [id number]  Date: 07/11/2024  DOB: 1934/05/29  Follow-Up Visit Note  CC: Plotnikov, [name], MD  Plotnikov, [name], MD    ICD-10-CM   1. Endometrial adenocarcinoma (HCC) [C54.1]  C54.1       Diagnosis: Stage IIIA grade 2 endometrial cancer; endometrial adenocarcinoma    Patient is Sophia Santos and speaks [place].  An interpreter is present to help with communication issues.   Interval Since Last Radiation: 2 years, 6 months, and 24 dats   Intent: Curative  Radiation Treatment Dates: 11/20/2021 through 12/18/2021 Site Technique Total Dose (Gy) Dose per Fx (Gy) Completed Fx Beam Energies  Vagina: Pelvis HDR-brachy 30/30 6 5/5 Ir-192    Narrative:  The patient returns today for routine follow-up. She was last seen here for follow-up on 01/11/24.   To review from out last visit, she was found to have a lesion along the left aspect of the vaginal cuff. She accordingly presented to Dr. Rogelio on 01/13/24 and a biopsy of the lesion was obtained at that time. Pathology thankfully showed no evidence of malignancy and benign findings including attenuated/atrophic squamous epithelium with reactive/reparative changes (and benign submucosal glands).       She then returned to gyn-onc on 04/01/24 for a follow up visit with Dr. Viktoria. During that time, she endorsed some ongoing abdominal and pelvic pain, as well as some urinary urgency and urge incontinence. GU exam performed at that time also noted a 2 x 3 cm area of hypopigmented tissue almost with a cobblestone appearance along the right apex of the vaginal cuff.  The lesion was noted as atypical in appearance but not friable. A similar appearing smaller, 1 cm lesion at the left apex was also demonstrated.  Biopsies of the lesions were deferred given the possibility that her exam findings were related to recent Monsel's use (at the time of he  previous biopsy) and because her recent biopsy results were benign.        In the setting of her reported urinary urgency and urge incontinence, Dr. Viktoria also placed a referral to urogynecology at that time.   Her exam findings also prompted a restaging CT AP with contrast on 04/05/24 which showed no evidence of metastatic disease within the abdomen or pelvis.   She again returned to Dr. Viktoria on 04/28/24 for a repeat pelvic exam and to review imaging findings. GU findings were noted to have significantly improved at that time other than a persistent cobblestone appearance of the bilateral vaginal apices. Repeat biopsies were offered at that time however the patient declined and instead will return to Dr. Viktoria in several weeks (08/19) for a repeat exam.               Patient notes bilateral leg heaviness and lower pelvic pain, consistent with her recent baseline. She denies any vaginal bleeding or changes in her bowel/bladder habits. She is not using her dilators.    Allergies:  is allergic to diclofenac sodium, fosamax [alendronate sodium], lipitor [atorvastatin ], voltaren [diclofenac sodium], and zocor [simvastatin].  Meds: Current Outpatient Medications  Medication Sig Dispense Refill   acetaminophen  (TYLENOL ) 325 MG tablet Take 352 mg by mouth every 6 (six) hours as needed for mild pain or moderate pain.     acyclovir  (ZOVIRAX ) 400 MG tablet Take 1 tablet (400 mg total) by mouth 3 (three) times daily. (Patient taking differently: Take by mouth  3 (three) times daily.) 21 tablet 3   ALPRAZolam  (XANAX ) 1 MG tablet TAKE 1 TABLET BY MOUTH AT BEDTIME AS NEEDED FOR ANXIETY. 90 tablet 1   amLODipine  (NORVASC ) 5 MG tablet TAKE 1 TABLET (5 MG TOTAL) BY MOUTH DAILY. 90 tablet 3   captopril  (CAPOTEN ) 50 MG tablet TAKE 1 TABLET BY MOUTH TWICE A DAY 180 tablet 1   Cholecalciferol  1000 UNITS tablet Take 1,000 Units by mouth daily. Vitamin d      Coenzyme Q10 (CO Q-10) 100 MG CAPS Take by mouth daily.      dicyclomine  (BENTYL ) 10 MG capsule TAKE 1 CAPSULE (10 MG TOTAL) BY MOUTH 4 TIMES A DAY BEFORE MEALS AND AT BEDTIME 90 capsule 3   ezetimibe  (ZETIA ) 10 MG tablet TAKE 1 TABLET BY MOUTH EVERY DAY 90 tablet 3   omeprazole  (PRILOSEC) 20 MG capsule Take 1 capsule (20 mg total) by mouth 2 (two) times daily before a meal. 180 capsule 1   Pancrelipase , Lip-Prot-Amyl, (CREON ) 24000-76000 units CPEP TAKE ONE CAPSULE BY MOUTH 3 TIMES A DAY WITH FOOD 270 capsule 3   polyethylene glycol powder (GAVILAX) 17 GM/SCOOP powder MIX 1 CAPFUL (17 GRAMS) AS DIRECTED AND DRINK TWICE A DAY AS NEEDED FOR MODERATE CONSTIPATION 510 g 5   propranolol  ER (INDERAL  LA) 120 MG 24 hr capsule TAKE 1 CAPSULE BY MOUTH EVERY DAY 90 capsule 2   RESTASIS  0.05 % ophthalmic emulsion Place 1 drop into both eyes 2 (two) times daily.      traZODone  (DESYREL ) 50 MG tablet Take 1-2 tablets (50-100 mg total) by mouth at bedtime. 180 tablet 3   Vitamin A  2400 MCG (8000 UT) CAPS Take 2,400 mcg by mouth daily.     cephALEXin  (KEFLEX ) 500 MG capsule TAKE 1 CAPSULE (500 MG TOTAL) BY MOUTH DAILY 90 capsule 1   meloxicam  (MOBIC ) 7.5 MG tablet Take 1 tablet (7.5 mg total) by mouth daily as needed for pain. (Patient not taking: Reported on 07/11/2024) 30 tablet 2   mirabegron  ER (MYRBETRIQ ) 50 MG TB24 tablet Take 1 tablet (50 mg total) by mouth daily. (Patient not taking: Reported on 07/11/2024) 30 tablet 11   Trolamine LIQD Use Blue Emu on sore joints 3 times a day. 226 mL 2   No current facility-administered medications for this encounter.    Physical Findings: The patient is in no acute distress. Patient is alert and oriented.  height is 5' (1.524 m) and weight is 134 lb 4 oz (60.9 kg). Her temporal temperature is 97.5 F (36.4 C) (abnormal). Her blood pressure is 128/98 (abnormal) and her pulse is 105 (abnormal). Her respiration is 18 and oxygen saturation is 97%. .  No significant changes. Lungs are clear to auscultation bilaterally. Heart has  regular rate and rhythm. No palpable cervical, supraclavicular, or axillary adenopathy. Abdomen soft, non-tender, normal bowel sounds.  On pelvic examination the external genitalia were unremarkable. A speculum exam was performed. There are no mucosal lesions noted in the vaginal vault. A Pap smear was obtained of the proximal vagina. On bimanual and rectovaginal examination there were no pelvic masses appreciated. ***  Lab Findings: Lab Results  Component Value Date   WBC 5.0 02/29/2024   HGB 12.9 02/29/2024   HCT 37.7 02/29/2024   MCV 95.3 02/29/2024   PLT 219.0 02/29/2024    Radiographic Findings: No results found.  Impression:  Stage IIIA grade 2 endometrial cancer; endometrial adenocarcinoma    Patient continues to experience abdominal pain. The pain has not  changed/worsened since her previous CT scan which showed no evidence of disease recurrence. She is scheduled for a biopsy in August for further evaluation.  Plan:  Follow-up with Dr. Viktoria in 3 months and radiation oncology in 6 months. Patient was educated on signs/symptoms that may indicate disease recurrence and understands to notify us  if she experiences any of them.     30 minutes of total time was spent for this patient encounter, including preparation, face-to-face counseling with the patient and coordination of care, physical exam, and documentation of the encounter. ____________________________________   Leeroy Due, PA-C   Lynwood CHARM Nasuti, PhD, MD   [facility]  Radiation Oncology Direct Dial: [phone]  Fax: [phone] [email or web address]    This document serves as a record of services personally performed by Lynwood Nasuti, MD. It was created on his behalf by Dorthy Fuse, a trained medical scribe. The creation of this record is based on the scribe's personal observations and the provider's statements to them. This document has been checked and approved by the attending provider.

## 2024-07-11 NOTE — Progress Notes (Signed)
 Sophia Santos is here today for follow up post radiation to the pelvic.  They completed their radiation on: 12/18/21   Does the patient complain of any of the following:  Pain: Reports low abdominal pain at times.  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   BP (!) 128/98 (BP Location: Left Arm, Patient Position: Sitting)   Pulse (!) 105   Temp (!) 97.5 F (36.4 C) (Temporal)   Resp 18   Ht 5' (1.524 m)   Wt 134 lb 4 oz (60.9 kg)   SpO2 97%   BMI 26.22 kg/m

## 2024-07-13 ENCOUNTER — Telehealth: Payer: Self-pay

## 2024-07-13 NOTE — Telephone Encounter (Signed)
 Left message for patient to call back so we can move appointment from 8/19 to 8/7.SABRA per Eleanor (NP).SABRA

## 2024-07-14 ENCOUNTER — Telehealth: Payer: Self-pay

## 2024-07-14 NOTE — Telephone Encounter (Signed)
 Left message for patient to call back about having appointment rescheduled.  Also sent mychart message to patient.

## 2024-07-14 NOTE — Telephone Encounter (Signed)
 Patient daughter called back and we rescheduled the appointment from 8/19 with Melissa NP to 8/7 with Dr Viktoria.SABRA

## 2024-07-15 DIAGNOSIS — Z556 Problems related to health literacy: Secondary | ICD-10-CM | POA: Diagnosis not present

## 2024-07-15 DIAGNOSIS — I10 Essential (primary) hypertension: Secondary | ICD-10-CM | POA: Diagnosis not present

## 2024-07-15 DIAGNOSIS — K589 Irritable bowel syndrome without diarrhea: Secondary | ICD-10-CM | POA: Diagnosis not present

## 2024-07-15 DIAGNOSIS — M1990 Unspecified osteoarthritis, unspecified site: Secondary | ICD-10-CM | POA: Diagnosis not present

## 2024-07-15 DIAGNOSIS — D630 Anemia in neoplastic disease: Secondary | ICD-10-CM | POA: Diagnosis not present

## 2024-07-15 DIAGNOSIS — G8929 Other chronic pain: Secondary | ICD-10-CM | POA: Diagnosis not present

## 2024-07-15 DIAGNOSIS — K219 Gastro-esophageal reflux disease without esophagitis: Secondary | ICD-10-CM | POA: Diagnosis not present

## 2024-07-15 DIAGNOSIS — M5442 Lumbago with sciatica, left side: Secondary | ICD-10-CM | POA: Diagnosis not present

## 2024-07-15 DIAGNOSIS — Z791 Long term (current) use of non-steroidal anti-inflammatories (NSAID): Secondary | ICD-10-CM | POA: Diagnosis not present

## 2024-07-15 DIAGNOSIS — Z602 Problems related to living alone: Secondary | ICD-10-CM | POA: Diagnosis not present

## 2024-07-20 DIAGNOSIS — Z791 Long term (current) use of non-steroidal anti-inflammatories (NSAID): Secondary | ICD-10-CM | POA: Diagnosis not present

## 2024-07-20 DIAGNOSIS — M1990 Unspecified osteoarthritis, unspecified site: Secondary | ICD-10-CM | POA: Diagnosis not present

## 2024-07-20 DIAGNOSIS — K589 Irritable bowel syndrome without diarrhea: Secondary | ICD-10-CM | POA: Diagnosis not present

## 2024-07-20 DIAGNOSIS — D630 Anemia in neoplastic disease: Secondary | ICD-10-CM | POA: Diagnosis not present

## 2024-07-20 DIAGNOSIS — Z602 Problems related to living alone: Secondary | ICD-10-CM | POA: Diagnosis not present

## 2024-07-20 DIAGNOSIS — I10 Essential (primary) hypertension: Secondary | ICD-10-CM | POA: Diagnosis not present

## 2024-07-20 DIAGNOSIS — M5442 Lumbago with sciatica, left side: Secondary | ICD-10-CM | POA: Diagnosis not present

## 2024-07-20 DIAGNOSIS — G8929 Other chronic pain: Secondary | ICD-10-CM | POA: Diagnosis not present

## 2024-07-20 DIAGNOSIS — Z556 Problems related to health literacy: Secondary | ICD-10-CM | POA: Diagnosis not present

## 2024-07-20 DIAGNOSIS — K219 Gastro-esophageal reflux disease without esophagitis: Secondary | ICD-10-CM | POA: Diagnosis not present

## 2024-07-21 ENCOUNTER — Encounter: Payer: Self-pay | Admitting: Gynecologic Oncology

## 2024-07-21 ENCOUNTER — Ambulatory Visit: Admitting: Obstetrics

## 2024-07-21 ENCOUNTER — Inpatient Hospital Stay: Attending: Obstetrics & Gynecology | Admitting: Gynecologic Oncology

## 2024-07-21 VITALS — BP 158/92 | HR 86 | Temp 98.0°F | Resp 20 | Wt 136.0 lb

## 2024-07-21 DIAGNOSIS — Z923 Personal history of irradiation: Secondary | ICD-10-CM | POA: Diagnosis not present

## 2024-07-21 DIAGNOSIS — Z8542 Personal history of malignant neoplasm of other parts of uterus: Secondary | ICD-10-CM | POA: Insufficient documentation

## 2024-07-21 DIAGNOSIS — N898 Other specified noninflammatory disorders of vagina: Secondary | ICD-10-CM

## 2024-07-21 DIAGNOSIS — C541 Malignant neoplasm of endometrium: Secondary | ICD-10-CM

## 2024-07-21 DIAGNOSIS — Z9221 Personal history of antineoplastic chemotherapy: Secondary | ICD-10-CM | POA: Diagnosis not present

## 2024-07-21 DIAGNOSIS — Z08 Encounter for follow-up examination after completed treatment for malignant neoplasm: Secondary | ICD-10-CM | POA: Diagnosis present

## 2024-07-21 NOTE — Patient Instructions (Signed)
 It was good to see you today.  Things look much better on your vaginal exam.  I will plan to see you in 3 months and we will reschedule your visit with Dr. Shannon 6 months.  Please call if you have any change in symptoms before your next visit.

## 2024-07-21 NOTE — Progress Notes (Signed)
 Gynecologic Oncology Return Clinic Visit  07/21/24  Reason for Visit: follow-up  Treatment History: Oncology History Overview Note  Endometrioid FIGO grade 1-2, MSI high   Endometrial adenocarcinoma (HCC)  07/19/2021 Imaging   US  pelvis 1. Endometrial thickness of 5.6 mm. In the setting of post-menopausal bleeding, endometrial sampling is indicated to exclude carcinoma.  2. Densely calcified shadowing mass within the right uterus presumably representing a calcified fibroid.   07/31/2021 Pathology Results   SURGICAL PATHOLOGY   FINAL MICROSCOPIC DIAGNOSIS:   A. ENDOMETRIUM, BIOPSY:  - Endometrial adenocarcinoma, FIGO grade 1.  See comment  SUMMARY INTERPRETATION: ABNORMAL   There is loss of the major and minor MMR proteins MLH1 and PMS2. The loss of expression may be secondary to promoter hyper-methylation, gene mutation or other genetic event. BRAF mutation testing and/or MLH1 methylation testing is indicated. The presence of a BRAF mutation and/or MLH1 hypermethylation is indicative of a sporadic-type tumor. The absence of either BRAF mutation and/or presence of normal methylation indicate the possible presence of a hereditary germline mutation (e.g. Lynch syndrome) and referral to genetic counseling is warranted.    08/05/2021 Initial Diagnosis   Endometrial adenocarcinoma (HCC)   08/21/2021 Pathology Results   FINAL MICROSCOPIC DIAGNOSIS:   A. LYMPH NODE, SENTINEL, RIGHT OBTURATOR, EXCISION:  - Lymph node, negative for carcinoma (0/1)   B. LYMPH NODE, SENTINEL, LEFT OBTURATOR, EXCISION:  - Lymph node, negative for carcinoma (0/1)   C. UTERUS, CERVIX, BILATERAL FALLOPIAN TUBES AND OVARIES:  - Endometrioid adenocarcinoma, FIGO grade 2  - Carcinoma invades through the entire myometrium and focally involves  the serosal surface  - Benign unremarkable cervix  - Benign bilateral fallopian tubes and ovaries  - See oncology table  - See comment   COMMENT:   C.  Tumor showed  areas with clear cell morphology but immunostains for Napsin A and P504S do not show evidence of clear cell carcinoma.  Also, there is significant tumor necrosis, but the viable portion of tumor appears to be FIGO grade 2.  Dr. Alvaro reviewed the select slides from the case and concurs with presence of serosal invasion.    ONCOLOGY TABLE:   UTERUS, CARCINOMA OR CARCINOSARCOMA: Resection   Procedure: Total hysterectomy and bilateral salpingo-oophorectomy  Histologic Type: Endometrioid adenocarcinoma  Histologic Grade: FIGO grade 1  Myometrial Invasion:       Depth of Myometrial Invasion (mm): 17 mm       Myometrial Thickness (mm): 17 mm       Percentage of Myometrial Invasion: 100%  Uterine Serosa Involvement: Present, focal  Cervical stromal Involvement: Not identified  Extent of involvement of other tissue/organs: Not identified  Peritoneal/Ascitic Fluid: Not applicable  Lymphovascular Invasion: Not identified  Regional Lymph Nodes:       Pelvic Lymph Nodes Examined:                                   2 Sentinel                                   0 Non-sentinel                                   2 Total       Pelvic Lymph Nodes with Metastasis: 0  Para-aortic Lymph Nodes Examined:                                    0 Sentinel                                    0 Non-sentinel                                    0 Total       Para-aortic Lymph Nodes with Metastasis: 0  Distant Metastasis:       Distant Site(s) Involved: Not applicable  Pathologic Stage Classification (pTNM, AJCC 8th Edition): pT3a, pN0  Ancillary Studies: MMR / MSI testing will be ordered  Representative Tumor Block: C10  Comment(s): Pancytokeratin was performed on the lymph nodes and is negative.    08/21/2021 Surgery   Surgeon: Eloy Maurilio Fitch    Operation: Robotic-assisted laparoscopic total hysterectomy with bilateral salpingoophorectomy, SLN biopsy     Operative Findings:  : 6cm uterus with anterior  fibroid (approx 4cm). Normal appearing tubes and ovaries. No suspicious nodes. Bilateral mapping.     10/03/2021 Cancer Staging   Staging form: Corpus Uteri - Carcinoma and Carcinosarcoma, AJCC 8th Edition - Clinical stage from 10/03/2021: FIGO Stage III (cT3, cN0, cM0) - Signed by Lonn Hicks, MD on 10/03/2021 Stage prefix: Initial diagnosis   10/14/2021 - 01/20/2022 Chemotherapy   Patient is on Treatment Plan : Uterine Carboplatin  (AUC 5) 21d      11/20/2021 - 12/18/2021 Radiation Therapy   11/20/2021 through 12/18/2021 Site Technique Total Dose (Gy) Dose per Fx (Gy) Completed Fx Beam Energies  Vagina: Pelvis HDR-brachy 30/30 6 5/5 Ir-192      03/21/2022 Imaging   No evidence of recurrent or metastatic carcinoma within the abdomen or pelvis.   Colonic diverticulosis, without radiographic evidence of diverticulitis.   Stable small benign left adrenal adenoma.   Aortic Atherosclerosis (ICD10-I70.0).     Interval History: Doing well.  Continues to have intermittent pelvic pain, unchanged.  Is more bothered by her arthritis.  Past Medical/Surgical History: Past Medical History:  Diagnosis Date   Arthritis    Complication of anesthesia    years ago, hard to awaken, no with recent procedures.   Diverticulosis    Endometrial cancer (HCC)    Family history of colon cancer 10/15/2021   Gallstones    GERD (gastroesophageal reflux disease)    Headache 08/15/2021   none recent   History of radiation therapy    Endometrium- vcc brachytherapy 11/20/21-12/18/21- Dr. Lynwood Nasuti   HTN (hypertension)    Hyperlipidemia    Irritable bowel syndrome (IBS)    Osteoarthritis    Osteopenia    Osteoporosis    PMB (postmenopausal bleeding) 08/15/2021   Skin cancer    skin cancer -    Urinary frequency 08/15/2021   Wears glasses 08/15/2021    Past Surgical History:  Procedure Laterality Date   bilateral cataracts     COLONOSCOPY     INGUINAL HERNIA REPAIR Bilateral 09/13/2020    Procedure: LAPAROSCOPIC BILATERAL INGUINAL HERNIA REPAIR WITH MESH;  Surgeon: Rubin Calamity, MD;  Location[facility];  Service: General;  Laterality: Bilateral;   ROBOTIC ASSISTED TOTAL HYSTERECTOMY WITH BILATERAL SALPINGO OOPHERECTOMY Bilateral 08/21/2021   Procedure:  XI ROBOTIC ASSISTED TOTAL HYSTERECTOMY WITH BILATERAL SALPINGO OOPHORECTOMY;  Surgeon: Eloy Herring, MD;  Location: [facility];  Service: Gynecology;  Laterality: Bilateral;   SENTINEL NODE BIOPSY N/A 08/21/2021   Procedure: SENTINEL NODE BIOPSY;  Surgeon: Eloy Herring, MD;  Location: [facility];  Service: Gynecology;  Laterality: N/A;   uterine prolapse surgery      Family History  Problem Relation Age of Onset   Colon cancer Mother 65   Stroke Mother    Parkinson's disease Father    Dementia Father    Colon cancer Sister 67   Colon polyps Sister    Cancer Maternal Aunt        unknown type   Cancer Paternal Aunt        unknown type; ?intestinal   Coronary artery disease Other        female 1st degree <50   Cancer Cousin        maternal female cousins; x3; unknown type   Breast cancer Cousin        maternal female cousin   Esophageal cancer Neg Hx    Stomach cancer Neg Hx    Anesthesia problems Neg Hx    Endometrial cancer Neg Hx    Pancreatic cancer Neg Hx    Prostate cancer Neg Hx     Social History   Socioeconomic History   Marital status: Widowed    Spouse name: Not on file   Number of children: 1   Years of education: Not on file   Highest education level: Not on file  Occupational History   Occupation: retired  Tobacco Use   Smoking status: Never   Smokeless tobacco: Never  Vaping Use   Vaping status: Never Used  Substance and Sexual Activity   Alcohol use: No   Drug use: No   Sexual activity: Not Currently    Birth control/protection: None  Other Topics Concern   Not on file  Social History Narrative   One Boy    Daily Caffeine  - tea         Social Drivers of  Health   Financial Resource Strain: Low Risk  (09/24/2022)   Overall Financial Resource Strain (CARDIA)    Difficulty of Paying Living Expenses: Not hard at all  Food Insecurity: No Food Insecurity (09/24/2022)   Hunger Vital Sign    Worried About Running Out of Food in the Last Year: Never true    Ran Out of Food in the Last Year: Never true  Transportation Needs: No Transportation Needs (09/24/2022)   PRAPARE - Administrator, Civil Service (Medical): No    Lack of Transportation (Non-Medical): No  Physical Activity: Inactive (09/24/2022)   Exercise Vital Sign    Days of Exercise per Week: 0 days    Minutes of Exercise per Session: 0 min  Stress: No Stress Concern Present (09/24/2022)   [employer] of Occupational Health - Occupational Stress Questionnaire    Feeling of Stress : Not at all  Social Connections: Moderately Integrated (09/24/2022)   Social Connection and Isolation Panel    Frequency of Communication with Friends and Family: More than three times a week    Frequency of Social Gatherings with Friends and Family: More than three times a week    Attends Religious Services: More than 4 times per year    Active Member of [employer] or Organizations: Yes    Attends Banker Meetings: More than 4 times per  year    Marital Status: Widowed    Current Medications:  Current Outpatient Medications:    acetaminophen  (TYLENOL ) 325 MG tablet, Take 352 mg by mouth every 6 (six) hours as needed for mild pain or moderate pain., Disp: , Rfl:    acyclovir  (ZOVIRAX ) 400 MG tablet, Take 1 tablet (400 mg total) by mouth 3 (three) times daily. (Patient taking differently: Take by mouth 3 (three) times daily.), Disp: 21 tablet, Rfl: 3   ALPRAZolam  (XANAX ) 1 MG tablet, TAKE 1 TABLET BY MOUTH AT BEDTIME AS NEEDED FOR ANXIETY., Disp: 90 tablet, Rfl: 1   amLODipine  (NORVASC ) 5 MG tablet, TAKE 1 TABLET (5 MG TOTAL) BY MOUTH DAILY., Disp: 90 tablet, Rfl: 3   captopril   (CAPOTEN ) 50 MG tablet, TAKE 1 TABLET BY MOUTH TWICE A DAY, Disp: 180 tablet, Rfl: 1   cephALEXin  (KEFLEX ) 500 MG capsule, TAKE 1 CAPSULE (500 MG TOTAL) BY MOUTH DAILY, Disp: 90 capsule, Rfl: 1   Cholecalciferol  1000 UNITS tablet, Take 1,000 Units by mouth daily. Vitamin d , Disp: , Rfl:    Coenzyme Q10 (CO Q-10) 100 MG CAPS, Take by mouth daily., Disp: , Rfl:    dicyclomine  (BENTYL ) 10 MG capsule, TAKE 1 CAPSULE (10 MG TOTAL) BY MOUTH 4 TIMES A DAY BEFORE MEALS AND AT BEDTIME, Disp: 90 capsule, Rfl: 3   ezetimibe  (ZETIA ) 10 MG tablet, TAKE 1 TABLET BY MOUTH EVERY DAY, Disp: 90 tablet, Rfl: 3   meloxicam  (MOBIC ) 7.5 MG tablet, Take 1 tablet (7.5 mg total) by mouth daily as needed for pain. (Patient not taking: Reported on 07/11/2024), Disp: 30 tablet, Rfl: 2   mirabegron  ER (MYRBETRIQ ) 50 MG TB24 tablet, Take 1 tablet (50 mg total) by mouth daily. (Patient not taking: Reported on 07/11/2024), Disp: 30 tablet, Rfl: 11   omeprazole  (PRILOSEC) 20 MG capsule, Take 1 capsule (20 mg total) by mouth 2 (two) times daily before a meal., Disp: 180 capsule, Rfl: 1   Pancrelipase , Lip-Prot-Amyl, (CREON ) 24000-76000 units CPEP, TAKE ONE CAPSULE BY MOUTH 3 TIMES A DAY WITH FOOD, Disp: 270 capsule, Rfl: 3   polyethylene glycol powder (GAVILAX) 17 GM/SCOOP powder, MIX 1 CAPFUL (17 GRAMS) AS DIRECTED AND DRINK TWICE A DAY AS NEEDED FOR MODERATE CONSTIPATION, Disp: 510 g, Rfl: 5   propranolol  ER (INDERAL  LA) 120 MG 24 hr capsule, TAKE 1 CAPSULE BY MOUTH EVERY DAY, Disp: 90 capsule, Rfl: 2   RESTASIS  0.05 % ophthalmic emulsion, Place 1 drop into both eyes 2 (two) times daily. , Disp: , Rfl:    traZODone  (DESYREL ) 50 MG tablet, Take 1-2 tablets (50-100 mg total) by mouth at bedtime., Disp: 180 tablet, Rfl: 3   Trolamine LIQD, Use Blue Emu on sore joints 3 times a day., Disp: 226 mL, Rfl: 2   Vitamin A  2400 MCG (8000 UT) CAPS, Take 2,400 mcg by mouth daily., Disp: , Rfl:   Review of Systems: Denies appetite changes,  fevers, chills, fatigue, unexplained weight changes. Denies hearing loss, neck lumps or masses, mouth sores, ringing in ears or voice changes. Denies cough or wheezing.  Denies shortness of breath. Denies chest pain or palpitations. Denies leg swelling. Denies abdominal distention, pain, blood in stools, constipation, diarrhea, nausea, vomiting, or early satiety. Denies pain with intercourse, dysuria, frequency, hematuria or incontinence. Denies hot flashes, pelvic pain, vaginal bleeding or vaginal discharge.   Denies joint pain, back pain or muscle pain/cramps. Denies itching, rash, or wounds. Denies dizziness, headaches, numbness or seizures. Denies swollen lymph nodes or  glands, denies easy bruising or bleeding. Denies anxiety, depression, confusion, or decreased concentration.  Physical Exam: BP (!) 158/92 Comment: manual recheck, pt asymptomatic, did not take BP medication today. Will monitor and f/u with PCP. MD aware  Pulse 86   Temp 98 F (36.7 C) (Oral)   Resp 20   Wt 136 lb (61.7 kg)   SpO2 99%   BMI 26.56 kg/m  General: Alert, oriented, no acute distress.   GU: Normal appearing external genitalia without erythema, excoriation, or lesions.  Speculum exam reveals utterly atrophic vaginal mucosa.  The midportion of the vaginal cuff is normal in appearance now.  At the left apex, there is 1 area of hypopigmentation that measures approximately 1 cm that still appears to be hemosiderin stained from the Lugol's.  On the right, there is still some tissue that has the same hemosiderin stained appearance, no cobblestone pattern appreciated today.  Overall this area has decreased in size.  No friability.  On bimanual exam, the cuff is smooth with no significant nodularity.   Laboratory & Radiologic Studies: CT A/P 04/05/24: IMPRESSION: No evidence for metastatic disease within the abdomen or pelvis.  Assessment & Plan: Sophia Santos is a 89 y.o. 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  Completed adjuvant chemotherapy and VBT in 01/2022. Post-treatment imaging in 03/2022 negative for evidence of disease. Most recent CT in 03/2024 negative for recurrence. Recent vaginal cuff biopsy in 12/2023 negative for recurrence.   Significant improvement in exam since her last visit.  No biopsy performed today.  Plan to see her in 3 months and we will change her visit with Dr. Shannon 6 months.  Reviewed signs and symptoms that should prompt a phone call before her neck scheduled visit.   Patient's blood pressure was elevated today.  She denies any symptoms.  Has not taken her blood pressure medication today.  20 minutes of total time was spent for this patient encounter, including preparation, face-to-face counseling with the patient and coordination of care, and documentation of the encounter.  Comer Dollar, MD  Division of Gynecologic Oncology  Department of Obstetrics and Gynecology  [facility]

## 2024-07-26 DIAGNOSIS — M1990 Unspecified osteoarthritis, unspecified site: Secondary | ICD-10-CM | POA: Diagnosis not present

## 2024-07-26 DIAGNOSIS — Z602 Problems related to living alone: Secondary | ICD-10-CM | POA: Diagnosis not present

## 2024-07-26 DIAGNOSIS — I10 Essential (primary) hypertension: Secondary | ICD-10-CM | POA: Diagnosis not present

## 2024-07-26 DIAGNOSIS — C541 Malignant neoplasm of endometrium: Secondary | ICD-10-CM | POA: Diagnosis not present

## 2024-07-26 DIAGNOSIS — Z556 Problems related to health literacy: Secondary | ICD-10-CM | POA: Diagnosis not present

## 2024-07-26 DIAGNOSIS — Z791 Long term (current) use of non-steroidal anti-inflammatories (NSAID): Secondary | ICD-10-CM | POA: Diagnosis not present

## 2024-07-26 DIAGNOSIS — D630 Anemia in neoplastic disease: Secondary | ICD-10-CM | POA: Diagnosis not present

## 2024-07-26 DIAGNOSIS — K589 Irritable bowel syndrome without diarrhea: Secondary | ICD-10-CM | POA: Diagnosis not present

## 2024-07-26 DIAGNOSIS — F411 Generalized anxiety disorder: Secondary | ICD-10-CM

## 2024-07-26 DIAGNOSIS — K219 Gastro-esophageal reflux disease without esophagitis: Secondary | ICD-10-CM | POA: Diagnosis not present

## 2024-07-26 DIAGNOSIS — M5442 Lumbago with sciatica, left side: Secondary | ICD-10-CM | POA: Diagnosis not present

## 2024-07-26 DIAGNOSIS — G8929 Other chronic pain: Secondary | ICD-10-CM | POA: Diagnosis not present

## 2024-08-02 ENCOUNTER — Ambulatory Visit: Admitting: Gynecologic Oncology

## 2024-08-03 DIAGNOSIS — Z556 Problems related to health literacy: Secondary | ICD-10-CM | POA: Diagnosis not present

## 2024-08-03 DIAGNOSIS — M5442 Lumbago with sciatica, left side: Secondary | ICD-10-CM | POA: Diagnosis not present

## 2024-08-03 DIAGNOSIS — D630 Anemia in neoplastic disease: Secondary | ICD-10-CM | POA: Diagnosis not present

## 2024-08-03 DIAGNOSIS — G8929 Other chronic pain: Secondary | ICD-10-CM | POA: Diagnosis not present

## 2024-08-03 DIAGNOSIS — K219 Gastro-esophageal reflux disease without esophagitis: Secondary | ICD-10-CM | POA: Diagnosis not present

## 2024-08-03 DIAGNOSIS — K589 Irritable bowel syndrome without diarrhea: Secondary | ICD-10-CM | POA: Diagnosis not present

## 2024-08-03 DIAGNOSIS — I10 Essential (primary) hypertension: Secondary | ICD-10-CM | POA: Diagnosis not present

## 2024-08-03 DIAGNOSIS — Z602 Problems related to living alone: Secondary | ICD-10-CM | POA: Diagnosis not present

## 2024-08-03 DIAGNOSIS — Z791 Long term (current) use of non-steroidal anti-inflammatories (NSAID): Secondary | ICD-10-CM | POA: Diagnosis not present

## 2024-08-03 DIAGNOSIS — M1990 Unspecified osteoarthritis, unspecified site: Secondary | ICD-10-CM | POA: Diagnosis not present

## 2024-08-04 DIAGNOSIS — Z08 Encounter for follow-up examination after completed treatment for malignant neoplasm: Secondary | ICD-10-CM | POA: Diagnosis not present

## 2024-08-04 DIAGNOSIS — D485 Neoplasm of uncertain behavior of skin: Secondary | ICD-10-CM | POA: Diagnosis not present

## 2024-08-04 DIAGNOSIS — D04111 Carcinoma in situ of skin of right upper eyelid, including canthus: Secondary | ICD-10-CM | POA: Diagnosis not present

## 2024-08-04 DIAGNOSIS — Z85828 Personal history of other malignant neoplasm of skin: Secondary | ICD-10-CM | POA: Diagnosis not present

## 2024-08-09 DIAGNOSIS — I10 Essential (primary) hypertension: Secondary | ICD-10-CM | POA: Diagnosis not present

## 2024-08-17 DIAGNOSIS — K589 Irritable bowel syndrome without diarrhea: Secondary | ICD-10-CM | POA: Diagnosis not present

## 2024-08-17 DIAGNOSIS — Z602 Problems related to living alone: Secondary | ICD-10-CM | POA: Diagnosis not present

## 2024-08-17 DIAGNOSIS — G8929 Other chronic pain: Secondary | ICD-10-CM | POA: Diagnosis not present

## 2024-08-17 DIAGNOSIS — Z791 Long term (current) use of non-steroidal anti-inflammatories (NSAID): Secondary | ICD-10-CM | POA: Diagnosis not present

## 2024-08-17 DIAGNOSIS — I10 Essential (primary) hypertension: Secondary | ICD-10-CM | POA: Diagnosis not present

## 2024-08-17 DIAGNOSIS — K219 Gastro-esophageal reflux disease without esophagitis: Secondary | ICD-10-CM | POA: Diagnosis not present

## 2024-08-17 DIAGNOSIS — M1990 Unspecified osteoarthritis, unspecified site: Secondary | ICD-10-CM | POA: Diagnosis not present

## 2024-08-17 DIAGNOSIS — Z556 Problems related to health literacy: Secondary | ICD-10-CM | POA: Diagnosis not present

## 2024-08-17 DIAGNOSIS — M5442 Lumbago with sciatica, left side: Secondary | ICD-10-CM | POA: Diagnosis not present

## 2024-08-17 DIAGNOSIS — D630 Anemia in neoplastic disease: Secondary | ICD-10-CM | POA: Diagnosis not present

## 2024-08-23 ENCOUNTER — Ambulatory Visit

## 2024-08-24 DIAGNOSIS — G8929 Other chronic pain: Secondary | ICD-10-CM | POA: Diagnosis not present

## 2024-08-24 DIAGNOSIS — K219 Gastro-esophageal reflux disease without esophagitis: Secondary | ICD-10-CM | POA: Diagnosis not present

## 2024-08-24 DIAGNOSIS — M5442 Lumbago with sciatica, left side: Secondary | ICD-10-CM | POA: Diagnosis not present

## 2024-08-24 DIAGNOSIS — K589 Irritable bowel syndrome without diarrhea: Secondary | ICD-10-CM | POA: Diagnosis not present

## 2024-08-24 DIAGNOSIS — D630 Anemia in neoplastic disease: Secondary | ICD-10-CM | POA: Diagnosis not present

## 2024-08-24 DIAGNOSIS — Z602 Problems related to living alone: Secondary | ICD-10-CM | POA: Diagnosis not present

## 2024-08-24 DIAGNOSIS — Z791 Long term (current) use of non-steroidal anti-inflammatories (NSAID): Secondary | ICD-10-CM | POA: Diagnosis not present

## 2024-08-24 DIAGNOSIS — I10 Essential (primary) hypertension: Secondary | ICD-10-CM | POA: Diagnosis not present

## 2024-08-24 DIAGNOSIS — M1990 Unspecified osteoarthritis, unspecified site: Secondary | ICD-10-CM | POA: Diagnosis not present

## 2024-08-24 DIAGNOSIS — Z556 Problems related to health literacy: Secondary | ICD-10-CM | POA: Diagnosis not present

## 2024-08-25 ENCOUNTER — Other Ambulatory Visit: Payer: Self-pay | Admitting: Internal Medicine

## 2024-08-30 ENCOUNTER — Telehealth: Payer: Self-pay | Admitting: Internal Medicine

## 2024-08-30 NOTE — Telephone Encounter (Signed)
 Copied from CRM [id number]. Topic: Medical Record Request - Other >> Aug 30, 2024  3:32 PM Suzen RAMAN wrote: Reason for CRM: [facility] is request plan of care be re-fax to an alternate fax number [phone].

## 2024-08-31 ENCOUNTER — Ambulatory Visit: Admitting: Internal Medicine

## 2024-08-31 ENCOUNTER — Encounter: Payer: Self-pay | Admitting: Internal Medicine

## 2024-08-31 VITALS — BP 140/96 | HR 83 | Temp 98.6°F | Ht 60.0 in | Wt 136.8 lb

## 2024-08-31 DIAGNOSIS — M25561 Pain in right knee: Secondary | ICD-10-CM | POA: Diagnosis not present

## 2024-08-31 DIAGNOSIS — F411 Generalized anxiety disorder: Secondary | ICD-10-CM | POA: Diagnosis not present

## 2024-08-31 DIAGNOSIS — M25562 Pain in left knee: Secondary | ICD-10-CM

## 2024-08-31 DIAGNOSIS — C44310 Basal cell carcinoma of skin of unspecified parts of face: Secondary | ICD-10-CM

## 2024-08-31 DIAGNOSIS — R413 Other amnesia: Secondary | ICD-10-CM

## 2024-08-31 DIAGNOSIS — D539 Nutritional anemia, unspecified: Secondary | ICD-10-CM

## 2024-08-31 MED ORDER — METHYLPREDNISOLONE ACETATE 40 MG/ML IJ SUSP
40.0000 mg | Freq: Once | INTRAMUSCULAR | Status: AC
  Administered 2024-08-31: 40 mg via INTRAMUSCULAR

## 2024-08-31 NOTE — Assessment & Plan Note (Signed)
MCD Discussed

## 2024-08-31 NOTE — Assessment & Plan Note (Signed)
 Check CBC

## 2024-08-31 NOTE — Assessment & Plan Note (Signed)
Chronic Cont on Alprazolam prn  Potential benefits of a long term benzodiazepines  use as well as potential risks  and complications were explained to the patient and were aknowledged.

## 2024-08-31 NOTE — Assessment & Plan Note (Signed)
 R eyelid

## 2024-08-31 NOTE — Progress Notes (Signed)
 Subjective:  [name], female    DOB: May 20, 1934  Age: 89 y.o. MRN: [id number]  CC: Follow-up (States pain in knees and ankles. )   HPI Sophia Santos presents for anxiety, HTN, OAB The patient is complaining of bilateral knee pain and difficulties walking.  She is requesting to inject both of her knees. She is here with her caregiver Tanya  Outpatient Medications Prior to Visit  Medication Sig Dispense Refill   acetaminophen  (TYLENOL ) 325 MG tablet Take 352 mg by mouth every 6 (six) hours as needed for mild pain or moderate pain.     acyclovir  (ZOVIRAX ) 400 MG tablet Take 1 tablet (400 mg total) by mouth 3 (three) times daily. (Patient taking differently: Take by mouth 3 (three) times daily.) 21 tablet 3   ALPRAZolam  (XANAX ) 1 MG tablet TAKE 1 TABLET BY MOUTH AT BEDTIME AS NEEDED FOR ANXIETY. 90 tablet 1   amLODipine  (NORVASC ) 5 MG tablet TAKE 1 TABLET (5 MG TOTAL) BY MOUTH DAILY. 90 tablet 3   captopril  (CAPOTEN ) 50 MG tablet TAKE 1 TABLET BY MOUTH TWICE A DAY 180 tablet 1   cephALEXin  (KEFLEX ) 500 MG capsule TAKE 1 CAPSULE (500 MG TOTAL) BY MOUTH DAILY 90 capsule 1   Cholecalciferol  1000 UNITS tablet Take 1,000 Units by mouth daily. Vitamin d      Coenzyme Q10 (CO Q-10) 100 MG CAPS Take by mouth daily.     dicyclomine  (BENTYL ) 10 MG capsule TAKE 1 CAPSULE (10 MG TOTAL) BY MOUTH 4 TIMES A DAY BEFORE MEALS AND AT BEDTIME 90 capsule 3   ezetimibe  (ZETIA ) 10 MG tablet TAKE 1 TABLET BY MOUTH EVERY DAY 90 tablet 3   meloxicam  (MOBIC ) 7.5 MG tablet Take 1 tablet (7.5 mg total) by mouth daily as needed for pain. (Patient not taking: Reported on 07/11/2024) 30 tablet 2   mirabegron  ER (MYRBETRIQ ) 50 MG TB24 tablet Take 1 tablet (50 mg total) by mouth daily. (Patient not taking: Reported on 07/11/2024) 30 tablet 11   omeprazole  (PRILOSEC) 20 MG capsule Take 1 capsule (20 mg total) by mouth 2 (two) times daily before a meal. 180 capsule 1   Pancrelipase , Lip-Prot-Amyl, (CREON )  24000-76000 units CPEP TAKE ONE CAPSULE BY MOUTH 3 TIMES A DAY WITH FOOD 270 capsule 3   polyethylene glycol powder (GAVILAX) 17 GM/SCOOP powder MIX 1 CAPFUL (17 GRAMS) AS DIRECTED AND DRINK TWICE A DAY AS NEEDED FOR MODERATE CONSTIPATION 510 g 5   propranolol  ER (INDERAL  LA) 120 MG 24 hr capsule TAKE 1 CAPSULE BY MOUTH EVERY DAY 90 capsule 2   RESTASIS  0.05 % ophthalmic emulsion Place 1 drop into both eyes 2 (two) times daily.      traZODone  (DESYREL ) 50 MG tablet Take 1-2 tablets (50-100 mg total) by mouth at bedtime. 180 tablet 3   Trolamine LIQD Use Blue Emu on sore joints 3 times a day. 226 mL 2   Vitamin A  2400 MCG (8000 UT) CAPS Take 2,400 mcg by mouth daily.     No facility-administered medications prior to visit.    ROS: Review of Systems  Constitutional:  Positive for fatigue. Negative for activity change, appetite change, chills and unexpected weight change.  HENT:  Negative for congestion, mouth sores and sinus pressure.   Eyes:  Negative for visual disturbance.  Respiratory:  Negative for cough and chest tightness.   Gastrointestinal:  Negative for abdominal pain and nausea.  Genitourinary:  Negative for difficulty urinating, frequency and vaginal pain.  Musculoskeletal:  Positive for arthralgias, back pain and gait problem.  Skin:  Negative for pallor and rash.  Neurological:  Negative for dizziness, tremors, weakness, numbness and headaches.  Psychiatric/Behavioral:  Positive for dysphoric mood. Negative for confusion, sleep disturbance and suicidal ideas. The patient is nervous/anxious.     Objective:  BP (!) 140/96   Pulse 83   Temp 98.6 F (37 C) (Oral)   Ht 5' (1.524 m)   Wt 136 lb 12.8 oz (62.1 kg)   SpO2 97%   BMI 26.72 kg/m   BP Readings from Last 3 Encounters:  08/31/24 (!) 140/96  07/21/24 (!) 158/92  07/11/24 (!) 128/98    Wt Readings from Last 3 Encounters:  08/31/24 136 lb 12.8 oz (62.1 kg)  08/23/24 136 lb (61.7 kg)  07/21/24 136 lb (61.7 kg)     Physical Exam Constitutional:      General: She is not in acute distress.    Appearance: She is well-developed.  HENT:     Head: Normocephalic.     Right Ear: External ear normal.     Left Ear: External ear normal.     Nose: Nose normal.  Eyes:     General:        Right eye: No discharge.        Left eye: No discharge.     Conjunctiva/sclera: Conjunctivae normal.     Pupils: Pupils are equal, round, and reactive to light.  Neck:     Thyroid : No thyromegaly.     Vascular: No JVD.     Trachea: No tracheal deviation.  Cardiovascular:     Rate and Rhythm: Normal rate and regular rhythm.     Heart sounds: Normal heart sounds.  Pulmonary:     Effort: No respiratory distress.     Breath sounds: No stridor. No wheezing.  Abdominal:     General: Bowel sounds are normal. There is no distension.     Palpations: Abdomen is soft. There is no mass.     Tenderness: There is no abdominal tenderness. There is no guarding or rebound.  Musculoskeletal:        General: Tenderness present.     Cervical back: Normal range of motion and neck supple. No rigidity.  Lymphadenopathy:     Cervical: No cervical adenopathy.  Skin:    Findings: No erythema or rash.  Neurological:     Cranial Nerves: No cranial nerve deficit.     Motor: No abnormal muscle tone.     Coordination: Coordination normal.     Deep Tendon Reflexes: Reflexes normal.  Psychiatric:        Behavior: Behavior normal.        Thought Content: Thought content normal.        Judgment: Judgment normal.   Both knees-pain with range of motion Right upper eyelid lesion consistent with cancer, scars on face from previous cancer surgeries   Procedure Note :     Procedure : Joint Injection, right     Knee   Indication:  Joint osteoarthritis with refractory  chronic pain.   Risks including unsuccessful procedure , bleeding, infection, bruising, skin atrophy, steroid flare-up and others were explained to the patient in  detail as well as the benefits. Informed consent was obtained verbally.  Tthe patient was placed in a comfortable position. Lateral approach was used on both knees. Skin was prepped with Betadine and alcohol  and anesthetized a cooling spray each. Then, a 5 cc syringe with a 1.5  inch long 25-gauge needle was used for a joint injection.. The needle was advanced  Into the knee joint cavity. I aspirated a small amount of intra-articular fluid to confirm correct placement of the needle and injected the joint with 5 mL of 2% lidocaine  and 40 mg of Depo-Medrol  on both sides.  Band-Aid was applied on both sides.   Tolerated well. Complications: None. Good pain relief  following the procedure.   Postprocedure instructions :    A Band-Aid should be left on for 12 hours. Injection therapy is not a cure itself. It is used in conjunction with other modalities. You can use nonsteroidal anti-inflammatories like ibuprofen , hot and cold compresses. Rest is recommended in the next 24 hours. You need to report immediately  if fever, chills or any signs of infection develop.  Lab Results  Component Value Date   WBC 5.0 02/29/2024   HGB 12.9 02/29/2024   HCT 37.7 02/29/2024   PLT 219.0 02/29/2024   GLUCOSE 114 (H) 02/29/2024   CHOL 238 (H) 05/06/2021   TRIG 106.0 05/06/2021   HDL 65.10 05/06/2021   LDLDIRECT 143.0 03/02/2018   LDLCALC 152 (H) 05/06/2021   ALT 13 02/29/2024   AST 15 02/29/2024   NA 137 02/29/2024   K 3.8 02/29/2024   CL 101 02/29/2024   CREATININE 0.69 02/29/2024   BUN 16 02/29/2024   CO2 29 02/29/2024   TSH 1.12 02/29/2024   INR 1.0 02/23/2020   HGBA1C 5.9 09/29/2007    No results found.  Assessment & Plan:   Problem List Items Addressed This Visit     Anxiety disorder   Chronic  Cont on Alprazolam  prn  Potential benefits of a long term benzodiazepines  use as well as potential risks  and complications were explained to the patient and were aknowledged.       Basal cell  carcinoma - Primary   R eyelid      Deficiency anemia   Check CBC      Memory deficit   MCD Discussed         No orders of the defined types were placed in this encounter.     Follow-up: Return in about 3 months (around 11/30/2024) for a follow-up visit.  Marolyn Noel, MD

## 2024-09-02 DIAGNOSIS — M5442 Lumbago with sciatica, left side: Secondary | ICD-10-CM | POA: Diagnosis not present

## 2024-09-02 DIAGNOSIS — D630 Anemia in neoplastic disease: Secondary | ICD-10-CM | POA: Diagnosis not present

## 2024-09-02 DIAGNOSIS — K219 Gastro-esophageal reflux disease without esophagitis: Secondary | ICD-10-CM | POA: Diagnosis not present

## 2024-09-02 DIAGNOSIS — Z556 Problems related to health literacy: Secondary | ICD-10-CM | POA: Diagnosis not present

## 2024-09-02 DIAGNOSIS — K589 Irritable bowel syndrome without diarrhea: Secondary | ICD-10-CM | POA: Diagnosis not present

## 2024-09-02 DIAGNOSIS — I10 Essential (primary) hypertension: Secondary | ICD-10-CM | POA: Diagnosis not present

## 2024-09-02 DIAGNOSIS — G8929 Other chronic pain: Secondary | ICD-10-CM | POA: Diagnosis not present

## 2024-09-02 DIAGNOSIS — Z791 Long term (current) use of non-steroidal anti-inflammatories (NSAID): Secondary | ICD-10-CM | POA: Diagnosis not present

## 2024-09-02 DIAGNOSIS — M1990 Unspecified osteoarthritis, unspecified site: Secondary | ICD-10-CM | POA: Diagnosis not present

## 2024-09-02 DIAGNOSIS — Z602 Problems related to living alone: Secondary | ICD-10-CM | POA: Diagnosis not present

## 2024-09-04 ENCOUNTER — Encounter: Payer: Self-pay | Admitting: Internal Medicine

## 2024-09-07 DIAGNOSIS — D630 Anemia in neoplastic disease: Secondary | ICD-10-CM | POA: Diagnosis not present

## 2024-09-07 DIAGNOSIS — K219 Gastro-esophageal reflux disease without esophagitis: Secondary | ICD-10-CM | POA: Diagnosis not present

## 2024-09-07 DIAGNOSIS — M5442 Lumbago with sciatica, left side: Secondary | ICD-10-CM | POA: Diagnosis not present

## 2024-09-07 DIAGNOSIS — I10 Essential (primary) hypertension: Secondary | ICD-10-CM | POA: Diagnosis not present

## 2024-09-07 DIAGNOSIS — K589 Irritable bowel syndrome without diarrhea: Secondary | ICD-10-CM | POA: Diagnosis not present

## 2024-09-07 DIAGNOSIS — Z791 Long term (current) use of non-steroidal anti-inflammatories (NSAID): Secondary | ICD-10-CM | POA: Diagnosis not present

## 2024-09-07 DIAGNOSIS — Z556 Problems related to health literacy: Secondary | ICD-10-CM | POA: Diagnosis not present

## 2024-09-07 DIAGNOSIS — G8929 Other chronic pain: Secondary | ICD-10-CM | POA: Diagnosis not present

## 2024-09-07 DIAGNOSIS — Z602 Problems related to living alone: Secondary | ICD-10-CM | POA: Diagnosis not present

## 2024-09-07 DIAGNOSIS — M1990 Unspecified osteoarthritis, unspecified site: Secondary | ICD-10-CM | POA: Diagnosis not present

## 2024-09-13 DIAGNOSIS — I10 Essential (primary) hypertension: Secondary | ICD-10-CM | POA: Diagnosis not present

## 2024-09-13 DIAGNOSIS — K589 Irritable bowel syndrome without diarrhea: Secondary | ICD-10-CM | POA: Diagnosis not present

## 2024-09-13 DIAGNOSIS — M5442 Lumbago with sciatica, left side: Secondary | ICD-10-CM | POA: Diagnosis not present

## 2024-09-13 DIAGNOSIS — Z556 Problems related to health literacy: Secondary | ICD-10-CM | POA: Diagnosis not present

## 2024-09-13 DIAGNOSIS — G8929 Other chronic pain: Secondary | ICD-10-CM | POA: Diagnosis not present

## 2024-09-13 DIAGNOSIS — M1990 Unspecified osteoarthritis, unspecified site: Secondary | ICD-10-CM | POA: Diagnosis not present

## 2024-09-13 DIAGNOSIS — Z602 Problems related to living alone: Secondary | ICD-10-CM | POA: Diagnosis not present

## 2024-09-13 DIAGNOSIS — D630 Anemia in neoplastic disease: Secondary | ICD-10-CM | POA: Diagnosis not present

## 2024-09-13 DIAGNOSIS — K219 Gastro-esophageal reflux disease without esophagitis: Secondary | ICD-10-CM | POA: Diagnosis not present

## 2024-09-13 DIAGNOSIS — Z791 Long term (current) use of non-steroidal anti-inflammatories (NSAID): Secondary | ICD-10-CM | POA: Diagnosis not present

## 2024-09-23 DIAGNOSIS — D04111 Carcinoma in situ of skin of right upper eyelid, including canthus: Secondary | ICD-10-CM | POA: Diagnosis not present

## 2024-09-30 ENCOUNTER — Ambulatory Visit: Admitting: Gynecologic Oncology

## 2024-10-19 ENCOUNTER — Encounter: Payer: Self-pay | Admitting: Gynecologic Oncology

## 2024-10-21 ENCOUNTER — Encounter: Payer: Self-pay | Admitting: Gynecologic Oncology

## 2024-10-21 ENCOUNTER — Ambulatory Visit: Payer: Self-pay | Admitting: Gynecologic Oncology

## 2024-10-21 ENCOUNTER — Inpatient Hospital Stay

## 2024-10-21 ENCOUNTER — Inpatient Hospital Stay: Attending: Obstetrics & Gynecology | Admitting: Gynecologic Oncology

## 2024-10-21 ENCOUNTER — Other Ambulatory Visit: Payer: Self-pay

## 2024-10-21 VITALS — BP 149/74 | HR 93 | Temp 98.2°F | Resp 19 | Wt 135.6 lb

## 2024-10-21 DIAGNOSIS — Z8542 Personal history of malignant neoplasm of other parts of uterus: Secondary | ICD-10-CM | POA: Insufficient documentation

## 2024-10-21 DIAGNOSIS — Z9079 Acquired absence of other genital organ(s): Secondary | ICD-10-CM | POA: Diagnosis not present

## 2024-10-21 DIAGNOSIS — Z9221 Personal history of antineoplastic chemotherapy: Secondary | ICD-10-CM | POA: Insufficient documentation

## 2024-10-21 DIAGNOSIS — Z90722 Acquired absence of ovaries, bilateral: Secondary | ICD-10-CM | POA: Insufficient documentation

## 2024-10-21 DIAGNOSIS — Z90710 Acquired absence of both cervix and uterus: Secondary | ICD-10-CM | POA: Diagnosis not present

## 2024-10-21 DIAGNOSIS — Z08 Encounter for follow-up examination after completed treatment for malignant neoplasm: Secondary | ICD-10-CM | POA: Insufficient documentation

## 2024-10-21 DIAGNOSIS — Z923 Personal history of irradiation: Secondary | ICD-10-CM | POA: Diagnosis not present

## 2024-10-21 DIAGNOSIS — R300 Dysuria: Secondary | ICD-10-CM

## 2024-10-21 DIAGNOSIS — C541 Malignant neoplasm of endometrium: Secondary | ICD-10-CM

## 2024-10-21 LAB — URINALYSIS, COMPLETE (UACMP) WITH MICROSCOPIC
Bacteria, UA: NONE SEEN
Bilirubin Urine: NEGATIVE
Glucose, UA: NEGATIVE mg/dL
Hgb urine dipstick: NEGATIVE
Ketones, ur: NEGATIVE mg/dL
Nitrite: NEGATIVE
Protein, ur: NEGATIVE mg/dL
Specific Gravity, Urine: 1.019 (ref 1.005–1.030)
pH: 5 (ref 5.0–8.0)

## 2024-10-21 NOTE — Patient Instructions (Signed)
 It was good to see you today.  I do not see or feel any evidence of cancer recurrence on your exam.  I will see you for follow-up in 6 months.  As always, if you develop any new and concerning symptoms before your next visit, please call to see me sooner.

## 2024-10-21 NOTE — Progress Notes (Signed)
 Gynecologic Oncology Return Clinic Visit  10/21/24  Reason for Visit: follow-up  Treatment History: Oncology History Overview Note  Endometrioid FIGO grade 1-2, MSI high   Endometrial adenocarcinoma (HCC)  07/19/2021 Imaging   US  pelvis 1. Endometrial thickness of 5.6 mm. In the setting of post-menopausal bleeding, endometrial sampling is indicated to exclude carcinoma.  2. Densely calcified shadowing mass within the right uterus presumably representing a calcified fibroid.   07/31/2021 Pathology Results   SURGICAL PATHOLOGY   FINAL MICROSCOPIC DIAGNOSIS:   A. ENDOMETRIUM, BIOPSY:  - Endometrial adenocarcinoma, FIGO grade 1.  See comment  SUMMARY INTERPRETATION: ABNORMAL   There is loss of the major and minor MMR proteins MLH1 and PMS2. The loss of expression may be secondary to promoter hyper-methylation, gene mutation or other genetic event. BRAF mutation testing and/or MLH1 methylation testing is indicated. The presence of a BRAF mutation and/or MLH1 hypermethylation is indicative of a sporadic-type tumor. The absence of either BRAF mutation and/or presence of normal methylation indicate the possible presence of a hereditary germline mutation (e.g. Lynch syndrome) and referral to genetic counseling is warranted.    08/05/2021 Initial Diagnosis   Endometrial adenocarcinoma (HCC)   08/21/2021 Pathology Results   FINAL MICROSCOPIC DIAGNOSIS:   A. LYMPH NODE, SENTINEL, RIGHT OBTURATOR, EXCISION:  - Lymph node, negative for carcinoma (0/1)   B. LYMPH NODE, SENTINEL, LEFT OBTURATOR, EXCISION:  - Lymph node, negative for carcinoma (0/1)   C. UTERUS, CERVIX, BILATERAL FALLOPIAN TUBES AND OVARIES:  - Endometrioid adenocarcinoma, FIGO grade 2  - Carcinoma invades through the entire myometrium and focally involves  the serosal surface  - Benign unremarkable cervix  - Benign bilateral fallopian tubes and ovaries  - See oncology table  - See comment   COMMENT:   C.  Tumor showed  areas with clear cell morphology but immunostains for Napsin A and P504S do not show evidence of clear cell carcinoma.  Also, there is significant tumor necrosis, but the viable portion of tumor appears to be FIGO grade 2.  Dr. Alvaro reviewed the select slides from the case and concurs with presence of serosal invasion.    ONCOLOGY TABLE:   UTERUS, CARCINOMA OR CARCINOSARCOMA: Resection   Procedure: Total hysterectomy and bilateral salpingo-oophorectomy  Histologic Type: Endometrioid adenocarcinoma  Histologic Grade: FIGO grade 1  Myometrial Invasion:       Depth of Myometrial Invasion (mm): 17 mm       Myometrial Thickness (mm): 17 mm       Percentage of Myometrial Invasion: 100%  Uterine Serosa Involvement: Present, focal  Cervical stromal Involvement: Not identified  Extent of involvement of other tissue/organs: Not identified  Peritoneal/Ascitic Fluid: Not applicable  Lymphovascular Invasion: Not identified  Regional Lymph Nodes:       Pelvic Lymph Nodes Examined:                                   2 Sentinel                                   0 Non-sentinel                                   2 Total       Pelvic Lymph Nodes with Metastasis: 0  Para-aortic Lymph Nodes Examined:                                    0 Sentinel                                    0 Non-sentinel                                    0 Total       Para-aortic Lymph Nodes with Metastasis: 0  Distant Metastasis:       Distant Site(s) Involved: Not applicable  Pathologic Stage Classification (pTNM, AJCC 8th Edition): pT3a, pN0  Ancillary Studies: MMR / MSI testing will be ordered  Representative Tumor Block: C10  Comment(s): Pancytokeratin was performed on the lymph nodes and is negative.    08/21/2021 Surgery   Surgeon: Eloy Maurilio Fitch    Operation: Robotic-assisted laparoscopic total hysterectomy with bilateral salpingoophorectomy, SLN biopsy     Operative Findings:  : 6cm uterus with anterior  fibroid (approx 4cm). Normal appearing tubes and ovaries. No suspicious nodes. Bilateral mapping.     10/03/2021 Cancer Staging   Staging form: Corpus Uteri - Carcinoma and Carcinosarcoma, AJCC 8th Edition - Clinical stage from 10/03/2021: FIGO Stage III (cT3, cN0, cM0) - Signed by Lonn Hicks, MD on 10/03/2021 Stage prefix: Initial diagnosis   10/14/2021 - 01/20/2022 Chemotherapy   Patient is on Treatment Plan : Uterine Carboplatin  (AUC 5) 21d      11/20/2021 - 12/18/2021 Radiation Therapy   11/20/2021 through 12/18/2021 Site Technique Total Dose (Gy) Dose per Fx (Gy) Completed Fx Beam Energies  Vagina: Pelvis HDR-brachy 30/30 6 5/5 Ir-192      03/21/2022 Imaging   No evidence of recurrent or metastatic carcinoma within the abdomen or pelvis.   Colonic diverticulosis, without radiographic evidence of diverticulitis.   Stable small benign left adrenal adenoma.   Aortic Atherosclerosis (ICD10-I70.0).     Interval History: Doing well.  Has some intermittent dysuria.  Had previously been on antibiotic suppression but has since stopped this as she was doing well.  Denies any vaginal bleeding or discharge.  Denies any pelvic or abdominal pain.  Has some intermittent constipation for which she uses MiraLAX .  Past Medical/Surgical History: Past Medical History:  Diagnosis Date   Arthritis    Complication of anesthesia    years ago, hard to awaken, no with recent procedures.   Diverticulosis    Endometrial cancer (HCC)    Family history of colon cancer 10/15/2021   Gallstones    GERD (gastroesophageal reflux disease)    Headache 08/15/2021   none recent   History of radiation therapy    Endometrium- vcc brachytherapy 11/20/21-12/18/21- Dr. Lynwood Nasuti   HTN (hypertension)    Hyperlipidemia    Irritable bowel syndrome (IBS)    Osteoarthritis    Osteopenia    Osteoporosis    PMB (postmenopausal bleeding) 08/15/2021   Skin cancer    skin cancer -    Urinary frequency 08/15/2021    Wears glasses 08/15/2021    Past Surgical History:  Procedure Laterality Date   bilateral cataracts     COLONOSCOPY     INGUINAL HERNIA REPAIR Bilateral 09/13/2020   Procedure: LAPAROSCOPIC BILATERAL INGUINAL HERNIA REPAIR WITH  MESH;  Surgeon: Rubin Calamity, MD;  Location: [facility] OR;  Service: General;  Laterality: Bilateral;   ROBOTIC ASSISTED TOTAL HYSTERECTOMY WITH BILATERAL SALPINGO OOPHERECTOMY Bilateral 08/21/2021   Procedure: XI ROBOTIC ASSISTED TOTAL HYSTERECTOMY WITH BILATERAL SALPINGO OOPHORECTOMY;  Surgeon: Eloy Herring, MD;  Location: [facility] ;  Service: Gynecology;  Laterality: Bilateral;   SENTINEL NODE BIOPSY N/A 08/21/2021   Procedure: SENTINEL NODE BIOPSY;  Surgeon: Eloy Herring, MD;  Location: [facility];  Service: Gynecology;  Laterality: N/A;   uterine prolapse surgery      Family History  Problem Relation Age of Onset   Colon cancer Mother 33   Stroke Mother    Parkinson's disease Father    Dementia Father    Colon cancer Sister 54   Colon polyps Sister    Cancer Maternal Aunt        unknown type   Cancer Paternal Aunt        unknown type; ?intestinal   Coronary artery disease Other        female 1st degree <50   Cancer Cousin        maternal female cousins; x3; unknown type   Breast cancer Cousin        maternal female cousin   Esophageal cancer Neg Hx    Stomach cancer Neg Hx    Anesthesia problems Neg Hx    Endometrial cancer Neg Hx    Pancreatic cancer Neg Hx    Prostate cancer Neg Hx     Social History   Socioeconomic History   Marital status: Widowed    Spouse name: Not on file   Number of children: 1   Years of education: Not on file   Highest education level: Not on file  Occupational History   Occupation: retired  Tobacco Use   Smoking status: Never   Smokeless tobacco: Never  Vaping Use   Vaping status: Never Used  Substance and Sexual Activity   Alcohol use: No   Drug use: No   Sexual activity: Not  Currently    Birth control/protection: None  Other Topics Concern   Not on file  Social History Narrative   One Boy    Daily Caffeine  - tea         Social Drivers of Health   Financial Resource Strain: Low Risk  (09/24/2022)   Overall Financial Resource Strain (CARDIA)    Difficulty of Paying Living Expenses: Not hard at all  Food Insecurity: No Food Insecurity (09/24/2022)   Hunger Vital Sign    Worried About Running Out of Food in the Last Year: Never true    Ran Out of Food in the Last Year: Never true  Transportation Needs: No Transportation Needs (09/24/2022)   PRAPARE - Administrator, Civil Service (Medical): No    Lack of Transportation (Non-Medical): No  Physical Activity: Inactive (09/24/2022)   Exercise Vital Sign    Days of Exercise per Week: 0 days    Minutes of Exercise per Session: 0 min  Stress: No Stress Concern Present (09/24/2022)   [employer] of Occupational Health - Occupational Stress Questionnaire    Feeling of Stress : Not at all  Social Connections: Moderately Integrated (09/24/2022)   Social Connection and Isolation Panel    Frequency of Communication with Friends and Family: More than three times a week    Frequency of Social Gatherings with Friends and Family: More than three times a week    Attends  Religious Services: More than 4 times per year    Active Member of Clubs or Organizations: Yes    Attends Banker Meetings: More than 4 times per year    Marital Status: Widowed    Current Medications:  Current Outpatient Medications:    acetaminophen  (TYLENOL ) 325 MG tablet, Take 352 mg by mouth every 6 (six) hours as needed for mild pain or moderate pain., Disp: , Rfl:    acyclovir  (ZOVIRAX ) 400 MG tablet, Take 1 tablet (400 mg total) by mouth 3 (three) times daily. (Patient taking differently: Take by mouth 3 (three) times daily.), Disp: 21 tablet, Rfl: 3   ALPRAZolam  (XANAX ) 1 MG tablet, TAKE 1 TABLET BY MOUTH AT  BEDTIME AS NEEDED FOR ANXIETY., Disp: 90 tablet, Rfl: 1   amLODipine  (NORVASC ) 5 MG tablet, TAKE 1 TABLET (5 MG TOTAL) BY MOUTH DAILY., Disp: 90 tablet, Rfl: 3   captopril  (CAPOTEN ) 50 MG tablet, TAKE 1 TABLET BY MOUTH TWICE A DAY, Disp: 180 tablet, Rfl: 1   cephALEXin  (KEFLEX ) 500 MG capsule, TAKE 1 CAPSULE (500 MG TOTAL) BY MOUTH DAILY, Disp: 90 capsule, Rfl: 1   Cholecalciferol  1000 UNITS tablet, Take 1,000 Units by mouth daily. Vitamin d , Disp: , Rfl:    Coenzyme Q10 (CO Q-10) 100 MG CAPS, Take by mouth daily., Disp: , Rfl:    dicyclomine  (BENTYL ) 10 MG capsule, TAKE 1 CAPSULE (10 MG TOTAL) BY MOUTH 4 TIMES A DAY BEFORE MEALS AND AT BEDTIME, Disp: 90 capsule, Rfl: 3   ezetimibe  (ZETIA ) 10 MG tablet, TAKE 1 TABLET BY MOUTH EVERY DAY, Disp: 90 tablet, Rfl: 3   meloxicam  (MOBIC ) 7.5 MG tablet, Take 1 tablet (7.5 mg total) by mouth daily as needed for pain. (Patient not taking: Reported on 07/11/2024), Disp: 30 tablet, Rfl: 2   mirabegron  ER (MYRBETRIQ ) 50 MG TB24 tablet, Take 1 tablet (50 mg total) by mouth daily. (Patient not taking: Reported on 07/11/2024), Disp: 30 tablet, Rfl: 11   omeprazole  (PRILOSEC) 20 MG capsule, Take 1 capsule (20 mg total) by mouth 2 (two) times daily before a meal., Disp: 180 capsule, Rfl: 1   Pancrelipase , Lip-Prot-Amyl, (CREON ) 24000-76000 units CPEP, TAKE ONE CAPSULE BY MOUTH 3 TIMES A DAY WITH FOOD, Disp: 270 capsule, Rfl: 3   polyethylene glycol powder (GAVILAX) 17 GM/SCOOP powder, MIX 1 CAPFUL (17 GRAMS) AS DIRECTED AND DRINK TWICE A DAY AS NEEDED FOR MODERATE CONSTIPATION, Disp: 510 g, Rfl: 5   propranolol  ER (INDERAL  LA) 120 MG 24 hr capsule, TAKE 1 CAPSULE BY MOUTH EVERY DAY, Disp: 90 capsule, Rfl: 2   RESTASIS  0.05 % ophthalmic emulsion, Place 1 drop into both eyes 2 (two) times daily. , Disp: , Rfl:    traZODone  (DESYREL ) 50 MG tablet, Take 1-2 tablets (50-100 mg total) by mouth at bedtime., Disp: 180 tablet, Rfl: 3   Trolamine LIQD, Use Blue Emu on sore joints  3 times a day., Disp: 226 mL, Rfl: 2   Vitamin A  2400 MCG (8000 UT) CAPS, Take 2,400 mcg by mouth daily., Disp: , Rfl:   Review of Systems: Denies appetite changes, fevers, chills, fatigue, unexplained weight changes. Denies hearing loss, neck lumps or masses, mouth sores, ringing in ears or voice changes. Denies cough or wheezing.  Denies shortness of breath. Denies chest pain or palpitations. Denies leg swelling. Denies abdominal distention, pain, blood in stools, diarrhea, nausea, vomiting, or early satiety. Denies pain with intercourse, dysuria, frequency, hematuria or incontinence. Denies hot flashes, pelvic pain, vaginal  bleeding or vaginal discharge.   Denies joint pain, back pain or muscle pain/cramps. Denies itching, rash, or wounds. Denies dizziness, headaches, numbness or seizures. Denies swollen lymph nodes or glands, denies easy bruising or bleeding. Denies anxiety, depression, confusion, or decreased concentration.  Physical Exam: BP (!) 149/74 (BP Location: Right Arm, Patient Position: Sitting)   Pulse 93   Temp 98.2 F (36.8 C) (Oral)   Resp 19   Wt 135 lb 9.6 oz (61.5 kg)   SpO2 98%   BMI 26.48 kg/m  General: Alert, oriented, no acute distress. HEENT: Posterior oropharynx clear, sclera anicteric. Chest: Clear to auscultation bilaterally.  No wheezes or rhonchi. Cardiovascular: Regular rate and rhythm, no murmurs. Abdomen: soft, nontender.  Normoactive bowel sounds.  No masses or hepatosplenomegaly appreciated.   Extremities: Grossly normal range of motion.  Warm, well perfused.  No edema bilaterally. Skin: Numerous skin lesions and moles. Lymphatics: No cervical, supraclavicular, or inguinal adenopathy. GU: Normal appearing external genitalia without erythema, excoriation, or lesions.  Speculum exam reveals discoloration at each vaginal apex with very small area that looks like the tissue is thinned at the left apex, mild discoloration along the apex itself in  linear fashion.  No areas of ulceration or cobblestone noted.  Bimanual exam reveals cuff is smooth, no nodularity or masses.  Rectovaginal exam confirms these findings.  Laboratory & Radiologic Studies: None new  Assessment & Plan: Sophia Santos is a 89 y.o. 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  Completed adjuvant chemotherapy and VBT in 01/2022. Post-treatment imaging in 03/2022 negative for evidence of disease. Most recent CT in 03/2024 negative for recurrence. Recent vaginal cuff biopsy in 12/2023 negative for recurrence.   Doing well.  Vaginal exam stable without areas concerning for recurrent disease.   Given urinary symptoms, plan for UA and urine culture today.  Encouraged her to discuss antibiotic prevention again with her PCP at her next visit.   She sees Dr. Shannon in 3 months and I will see her for follow-up in 6 months.  Reviewed signs and symptoms that should prompt a phone call before her neck scheduled visit.  22 minutes of total time was spent for this patient encounter, including preparation, face-to-face counseling with the patient and coordination of care, and documentation of the encounter.  Comer Dollar, MD  Division of Gynecologic Oncology  Department of Obstetrics and Gynecology  [facility]

## 2024-10-22 LAB — URINE CULTURE: Culture: NO GROWTH

## 2024-10-24 ENCOUNTER — Other Ambulatory Visit: Payer: Self-pay | Admitting: Internal Medicine

## 2024-10-24 NOTE — Telephone Encounter (Signed)
 Attempted to reach patient to relay results. Left voicemail requesting call back.

## 2024-10-24 NOTE — Telephone Encounter (Signed)
-----   Message from Comer JONELLE Dollar sent at 10/23/2024  3:25 PM EST ----- Regarding: FW: Could you please call the patient tomorrow and let her know that there is no urine infection? Thank you ----- Message ----- From: Rebecka, [facility] [place] Sent: 10/21/2024   4:15 PM EST To: Comer JONELLE Dollar, MD

## 2024-11-25 ENCOUNTER — Other Ambulatory Visit: Payer: Self-pay | Admitting: Internal Medicine

## 2024-11-29 ENCOUNTER — Ambulatory Visit

## 2024-12-01 ENCOUNTER — Ambulatory Visit: Admitting: Internal Medicine

## 2024-12-21 ENCOUNTER — Ambulatory Visit

## 2024-12-21 VITALS — BP 140/80 | HR 79 | Ht 60.0 in | Wt 134.6 lb

## 2024-12-21 DIAGNOSIS — Z0000 Encounter for general adult medical examination without abnormal findings: Secondary | ICD-10-CM

## 2024-12-21 NOTE — Progress Notes (Addendum)
 "  Chief Complaint  Patient presents with   Medicare Wellness     Subjective:  Please attest and cosign this visit due to patients primary care provider not being in the office at the time the visit was completed.  (Pt of Dr A. Plotnikov)   Sophia Santos is a 89 y.o. female who presents for a Medicare Annual Wellness Visit.  Visit info / Clinical Intake: Medicare Wellness Visit Type:: Subsequent Annual Wellness Visit Persons participating in visit and providing information:: caregiver (with patient present) Medicare Wellness Visit Mode:: In-person (required for WTM) Interpreter Needed?: Yes Interpreter Agency: [employer] Interpreter Name: Lajune Sar Patient Declined Interpreter : No Interpretation services provided by: [place] Interpreter Patient signed [place] waiver: No Pre-visit prep was completed: yes AWV questionnaire completed by patient prior to visit?: no Living arrangements:: (!) lives alone Patient's Overall Health Status Rating: good Typical amount of pain: none Does pain affect daily life?: no Are you currently prescribed opioids?: no  Dietary Habits and Nutritional Risks How many meals a day?: 3 Eats fruit and vegetables daily?: yes Most meals are obtained by: preparing own meals In the last 2 weeks, have you had any of the following?: none Diabetic:: no  Functional Status Activities of Daily Living (to include ambulation/medication): Independent Ambulation: Independent with device- listed below Home Assistive Devices/Equipment: Eyeglasses Medication Administration: Independent Home Management (perform basic housework or laundry): Needs assistance (comment) (Daughter handles) Manage your own finances?: yes (Daughter handles) Primary transportation is: family / friends Concerns about vision?: no *vision screening is required for WTM* Concerns about hearing?: no  Fall Screening Falls in the past year?: 0 Number of falls in past year: 0 Was  there an injury with Fall?: 0 Fall Risk Category Calculator: 0 Patient Fall Risk Level: Low Fall Risk  Fall Risk Patient at Risk for Falls Due to: No Fall Risks; Impaired balance/gait Fall risk Follow up: Falls evaluation completed; Falls prevention discussed  Home and Transportation Safety: All rugs have non-skid backing?: N/A, no rugs All stairs or steps have railings?: yes Grab bars in the bathtub or shower?: yes Have non-skid surface in bathtub or shower?: yes Good home lighting?: yes Regular seat belt use?: yes Hospital stays in the last year:: no  Cognitive Assessment Difficulty concentrating, remembering, or making decisions? : no Will 6CIT or Mini Cog be Completed: yes What year is it?: 0 points What month is it?: 0 points Give patient an address phrase to remember (5 components): [address] [place], [place] About what time is it?: 0 points Count backwards from 20 to 1: 0 points Say the months of the year in reverse: 0 points Repeat the address phrase from earlier: 4 points ([place], [place]) 6 CIT Score: 4 points  Advance Directives (For Healthcare) Does Patient Have a Medical Advance Directive?: Yes Does patient want to make changes to medical advance directive?: Yes (Inpatient - patient requests chaplain consult to change a medical advance directive) Type of Advance Directive: Healthcare Power of [place]; Living will Copy of Healthcare Power of Attorney in Chart?: No - copy requested Copy of Living Will in Chart?: No - copy requested  Reviewed/Updated  Reviewed/Updated: Reviewed All (Medical, Surgical, Family, Medications, Allergies, Care Teams, Patient Goals)    Allergies (verified) Diclofenac sodium, Fosamax [alendronate sodium], Lipitor [atorvastatin ], Voltaren [diclofenac sodium], and Zocor [simvastatin]   Current Medications (verified) Outpatient Encounter Medications as of 12/21/2024  Medication Sig   acetaminophen  (TYLENOL ) 325 MG tablet Take 352 mg by  mouth every  6 (six) hours as needed for mild pain or moderate pain.   acyclovir  (ZOVIRAX ) 400 MG tablet Take 1 tablet (400 mg total) by mouth 3 (three) times daily. (Patient taking differently: Take by mouth 3 (three) times daily.)   ALPRAZolam  (XANAX ) 1 MG tablet TAKE 1 TABLET BY MOUTH AT BEDTIME AS NEEDED FOR ANXIETY   amLODipine  (NORVASC ) 5 MG tablet TAKE 1 TABLET (5 MG TOTAL) BY MOUTH DAILY.   captopril  (CAPOTEN ) 50 MG tablet TAKE 1 TABLET BY MOUTH TWICE A DAY   cephALEXin  (KEFLEX ) 500 MG capsule TAKE 1 CAPSULE (500 MG TOTAL) BY MOUTH DAILY   Cholecalciferol  1000 UNITS tablet Take 1,000 Units by mouth daily. Vitamin d    Coenzyme Q10 (CO Q-10) 100 MG CAPS Take by mouth daily.   CREON  24000-76000 units CPEP TAKE ONE CAPSULE BY MOUTH 3 TIMES A DAY WITH FOOD   dicyclomine  (BENTYL ) 10 MG capsule TAKE 1 CAPSULE (10 MG TOTAL) BY MOUTH 4 TIMES A DAY BEFORE MEALS AND AT BEDTIME   ezetimibe  (ZETIA ) 10 MG tablet TAKE 1 TABLET BY MOUTH EVERY DAY   meloxicam  (MOBIC ) 7.5 MG tablet TAKE 1 TABLET BY MOUTH DAILY AS NEEDED FOR PAIN   mirabegron  ER (MYRBETRIQ ) 50 MG TB24 tablet Take 1 tablet (50 mg total) by mouth daily.   omeprazole  (PRILOSEC) 20 MG capsule Take 1 capsule (20 mg total) by mouth 2 (two) times daily before a meal.   polyethylene glycol powder (GAVILAX) 17 GM/SCOOP powder MIX 1 CAPFUL (17 GRAMS) AS DIRECTED AND DRINK TWICE A DAY AS NEEDED FOR MODERATE CONSTIPATION   propranolol  ER (INDERAL  LA) 120 MG 24 hr capsule TAKE 1 CAPSULE BY MOUTH EVERY DAY   RESTASIS  0.05 % ophthalmic emulsion Place 1 drop into both eyes 2 (two) times daily.    traZODone  (DESYREL ) 50 MG tablet Take 1-2 tablets (50-100 mg total) by mouth at bedtime.   Trolamine LIQD Use Blue Emu on sore joints 3 times a day.   Vitamin A  2400 MCG (8000 UT) CAPS Take 2,400 mcg by mouth daily.   No facility-administered encounter medications on file as of 12/21/2024.    History: Past Medical History:  Diagnosis Date   Arthritis     Complication of anesthesia    years ago, hard to awaken, no with recent procedures.   Diverticulosis    Endometrial cancer (HCC)    Family history of colon cancer 10/15/2021   Gallstones    GERD (gastroesophageal reflux disease)    Headache 08/15/2021   none recent   History of radiation therapy    Endometrium- vcc brachytherapy 11/20/21-12/18/21- Dr. Lynwood Nasuti   HTN (hypertension)    Hyperlipidemia    Irritable bowel syndrome (IBS)    Osteoarthritis    Osteopenia    Osteoporosis    PMB (postmenopausal bleeding) 08/15/2021   Skin cancer    skin cancer -    Urinary frequency 08/15/2021   Wears glasses 08/15/2021   Past Surgical History:  Procedure Laterality Date   bilateral cataracts     COLONOSCOPY     INGUINAL HERNIA REPAIR Bilateral 09/13/2020   Procedure: LAPAROSCOPIC BILATERAL INGUINAL HERNIA REPAIR WITH MESH;  Surgeon: Rubin Calamity, MD;  Location: [facility] OR;  Service: General;  Laterality: Bilateral;   ROBOTIC ASSISTED TOTAL HYSTERECTOMY WITH BILATERAL SALPINGO OOPHERECTOMY Bilateral 08/21/2021   Procedure: XI ROBOTIC ASSISTED TOTAL HYSTERECTOMY WITH BILATERAL SALPINGO OOPHORECTOMY;  Surgeon: Eloy Herring, MD;  Location: [facility];  Service: Gynecology;  Laterality: Bilateral;   SENTINEL NODE  BIOPSY N/A 08/21/2021   Procedure: SENTINEL NODE BIOPSY;  Surgeon: Eloy Herring, MD;  [facility];  Service: Gynecology;  Laterality: N/A;   uterine prolapse surgery     Family History  Problem Relation Age of Onset   Colon cancer Mother 34   Stroke Mother    Parkinson's disease Father    Dementia Father    Colon cancer Sister 62   Colon polyps Sister    Cancer Maternal Aunt        unknown type   Cancer Paternal Aunt        unknown type; ?intestinal   Coronary artery disease Other        female 1st degree <50   Cancer Cousin        maternal female cousins; x3; unknown type   Breast cancer Cousin        maternal female cousin    Esophageal cancer Neg Hx    Stomach cancer Neg Hx    Anesthesia problems Neg Hx    Endometrial cancer Neg Hx    Pancreatic cancer Neg Hx    Prostate cancer Neg Hx    Social History   Occupational History   Occupation: retired  Tobacco Use   Smoking status: Never   Smokeless tobacco: Never  Vaping Use   Vaping status: Never Used  Substance and Sexual Activity   Alcohol use: No   Drug use: No   Sexual activity: Not Currently    Birth control/protection: None   Tobacco Counseling Counseling given: No  SDOH Screenings   Food Insecurity: No Food Insecurity (12/21/2024)  Housing: Unknown (12/21/2024)  Transportation Needs: No Transportation Needs (12/21/2024)  Utilities: Not At Risk (12/21/2024)  Alcohol Screen: Low Risk (09/24/2022)  Depression (PHQ2-9): Low Risk (12/21/2024)  Financial Resource Strain: Low Risk (09/24/2022)  Physical Activity: Inactive (12/21/2024)  Social Connections: Moderately Integrated (12/21/2024)  Stress: No Stress Concern Present (12/21/2024)  Tobacco Use: Low Risk (12/21/2024)  Health Literacy: Inadequate Health Literacy (12/21/2024)   See flowsheets for full screening details  Depression Screen PHQ 2 & 9 Depression Scale- Over the past 2 weeks, how often have you been bothered by any of the following problems? Little interest or pleasure in doing things: 0 Feeling down, depressed, or hopeless (PHQ Adolescent also includes...irritable): 0 PHQ-2 Total Score: 0 Trouble falling or staying asleep, or sleeping too much: 0 Feeling tired or having little energy: 0 Poor appetite or overeating (PHQ Adolescent also includes...weight loss): 0 Feeling bad about yourself - or that you are a failure or have let yourself or your family down: 0 Trouble concentrating on things, such as reading the newspaper or watching television (PHQ Adolescent also includes...like school work): 0 Moving or speaking so slowly that other people could have noticed. Or the opposite - being so  fidgety or restless that you have been moving around a lot more than usual: 3 (uses a cane/walker) Thoughts that you would be better off dead, or of hurting yourself in some way: 0 PHQ-9 Total Score: 3 If you checked off any problems, how difficult have these problems made it for you to do your work, take care of things at home, or get along with other people?: Not difficult at all  Depression Treatment Depression Interventions/Treatment : Medication; Currently on Treatment; PHQ2-9 Score <4 Follow-up Not Indicated     Goals Addressed               This Visit's Progress  Patient Stated (pt-stated)        Patient stated she plans to continue monitoring her bp readings             Objective:    Today's Vitals   12/21/24 1424  BP: (!) 140/80  Pulse: 79  SpO2: 98%  Weight: 134 lb 9.6 oz (61.1 kg)  Height: 5' (1.524 m)   Body mass index is 26.29 kg/m.  Hearing/Vision screen Hearing Screening - Comments:: Denies hearing difficulties   Vision Screening - Comments:: Wears rx glasses - up to date with routine eye exams with Donnice Sickles  Immunizations and Health Maintenance Health Maintenance  Topic Date Due   Bone Density Scan  Never done   Influenza Vaccine  07/15/2024   COVID-19 Vaccine (4 - 2025-26 season) 08/15/2024   Medicare Annual Wellness (AWV)  12/21/2025   DTaP/Tdap/Td (2 - Td or Tdap) 06/25/2030   Pneumococcal Vaccine: 50+ Years  Completed   Zoster Vaccines- Shingrix  Completed   Meningococcal B Vaccine  Aged Out   Mammogram  Discontinued        Assessment/Plan:  This is a routine wellness examination for Sophia Santos.  I have recommended that this patient have a DEXA Scan but she declines at this time. I have discussed the risks and benefits of this procedure with her. The patient verbalizes understanding. Pt plans to discuss the test w/PCP at 01/2025 appt.  Influenza vaccine status: pt/daughter stated she had the vaccine at last appt  w/PCP.  Patient Care Team: Plotnikov, [name], MD as PCP - General Barbette Knock, MD (Obstetrics and Gynecology) Rubin Calamity, MD as Consulting Physician (General Surgery) Teressa Toribio SQUIBB, MD (Inactive) as Attending Physician (Gastroenterology) Sickles Donnice, MD as Referring Physician (Ophthalmology)  I have personally reviewed and noted the following in the patients chart:   Medical and social history Use of alcohol, tobacco or illicit drugs  Current medications and supplements including opioid prescriptions. Functional ability and status Nutritional status Physical activity Advanced directives List of other physicians Hospitalizations, surgeries, and ER visits in previous 12 months Vitals Screenings to include cognitive, depression, and falls Referrals and appointments  No orders of the defined types were placed in this encounter.  In addition, I have reviewed and discussed with patient certain preventive protocols, quality metrics, and best practice recommendations. A written personalized care plan for preventive services as well as general preventive health recommendations were provided to patient.   Verdie CHRISTELLA Saba, CMA   12/21/2024   Return in 1 year (on 12/21/2025).  After Visit Summary: (In Person-Declined) Patient declined AVS at this time.  Nurse Notes:  "

## 2024-12-21 NOTE — Patient Instructions (Signed)
 Sophia Santos,  Thank you for taking the time for your Medicare Wellness Visit. I appreciate your continued commitment to your health goals. Please review the care plan we discussed, and feel free to reach out if I can assist you further.  Please note that Annual Wellness Visits do not include a physical exam. Some assessments may be limited, especially if the visit was conducted virtually. If needed, we may recommend an in-person follow-up with your provider.  Ongoing Care Seeing your primary care provider every 3 to 6 months helps us  monitor your health and provide consistent, personalized care.   Referrals If a referral was made during today's visit and you haven't received any updates within two weeks, please contact the referred provider directly to check on the status.  Recommended Screenings:  Health Maintenance  Topic Date Due   Osteoporosis screening with Bone Density Scan  Never done   Flu Shot  07/15/2024   COVID-19 Vaccine (4 - 2025-26 season) 08/15/2024   Medicare Annual Wellness Visit  12/21/2025   DTaP/Tdap/Td vaccine (2 - Td or Tdap) 06/25/2030   Pneumococcal Vaccine for age over 67  Completed   Zoster (Shingles) Vaccine  Completed   Meningitis B Vaccine  Aged Out   Breast Cancer Screening  Discontinued       07/11/2024    3:38 PM  Advanced Directives  Does Patient Have a Medical Advance Directive? No  Does patient want to make changes to medical advance directive? No - Patient declined    Vision: Annual vision screenings are recommended for early detection of glaucoma, cataracts, and diabetic retinopathy. These exams can also reveal signs of chronic conditions such as diabetes and high blood pressure.  Dental: Annual dental screenings help detect early signs of oral cancer, gum disease, and other conditions linked to overall health, including heart disease and diabetes.

## 2024-12-23 ENCOUNTER — Encounter: Payer: Self-pay | Admitting: Internal Medicine

## 2025-01-04 ENCOUNTER — Telehealth: Payer: Self-pay | Admitting: *Deleted

## 2025-01-04 NOTE — Telephone Encounter (Addendum)
 Spoke with Elveria patient's daughter who called to say Ms. Charo started having vaginal bleeding last night. She noticed it after urinating. The bleeding is red, and she is wearing a pad and only changing the pad for sanitary reasons. [name] states the bleeding is spotting denies passing clots. Patient also denies fever, pain and no urinary symptoms. [name] reports that patient doesn't have regular bowel movements and can't rule out rectal hemorrhoids as a cause for the bleeding, but patient believes it's vaginal.    [name] reports that patient has not put anything in the vagina, hasn't done any heavy lifting and denies any activity prior to the bleeding. Pt is also not taking any hormonal medications or blood thinners.   [name] that her message will be relayed to providers and the office would call back with recommendations. And if the patient's bleeding becomes heavy, with or without clots and she is saturating her pad 100% and having to change frequently go the ED for evaluation. She verbalized understanding and thanked the office.

## 2025-01-05 NOTE — Telephone Encounter (Signed)
 Spoke with patient's daughter Elveria and relayed message that Dr. Shannon has available appointments next week in the am. Elveria states they need an afternoon appointment.   Patient was given an appointment with Dr. Shannon for Monday, February 2nd at 3:15 pm. They agreed to date and time and thanked the office.  [name] states Ms. Jaggi is only having vaginal spotting and she is aware to call or have patient evaluated at ED if bleeding becomes heavy with or without clots.

## 2025-01-09 ENCOUNTER — Ambulatory Visit: Admitting: Radiation Oncology

## 2025-01-15 NOTE — Progress Notes (Incomplete)
 "  Radiation Oncology         [phone] ________________________________  Name: Sophia Santos MRN: [id number]  Date: 01/16/2025  DOB: 09/09/1934  Follow-Up Visit Note  CC: Plotnikov, [name], MD  Plotnikov, [name], MD  No diagnosis found.  Diagnosis: Stage IIIA grade 2 endometrial cancer; endometrial adenocarcinoma     Patient is [place] and speaks Russian.  An interpreter is present to help with communication issues.   Interval Since Last Radiation: Approximately 2 years and 11 months   Intent: Curative  Radiation Treatment Dates: 11/20/2021 through 12/18/2021 Site Technique Total Dose (Gy) Dose per Fx (Gy) Completed Fx Beam Energies  Vagina: Pelvis HDR-brachy 30/30 6 5/5 Ir-192    Narrative:  The patient returns today for routine follow-up.  ***                              Allergies:  is allergic to diclofenac sodium, fosamax [alendronate sodium], lipitor [atorvastatin ], voltaren [diclofenac sodium], and zocor [simvastatin].  Meds: Current Outpatient Medications  Medication Sig Dispense Refill   acetaminophen  (TYLENOL ) 325 MG tablet Take 352 mg by mouth every 6 (six) hours as needed for mild pain or moderate pain.     acyclovir  (ZOVIRAX ) 400 MG tablet Take 1 tablet (400 mg total) by mouth 3 (three) times daily. (Patient taking differently: Take by mouth 3 (three) times daily.) 21 tablet 3   ALPRAZolam  (XANAX ) 1 MG tablet TAKE 1 TABLET BY MOUTH AT BEDTIME AS NEEDED FOR ANXIETY 90 tablet 1   amLODipine  (NORVASC ) 5 MG tablet TAKE 1 TABLET (5 MG TOTAL) BY MOUTH DAILY. 90 tablet 3   captopril  (CAPOTEN ) 50 MG tablet TAKE 1 TABLET BY MOUTH TWICE A DAY 180 tablet 1   cephALEXin  (KEFLEX ) 500 MG capsule TAKE 1 CAPSULE (500 MG TOTAL) BY MOUTH DAILY 90 capsule 1   Cholecalciferol  1000 UNITS tablet Take 1,000 Units by mouth daily. Vitamin d      Coenzyme Q10 (CO Q-10) 100 MG CAPS Take by mouth daily.     CREON  24000-76000 units CPEP TAKE ONE CAPSULE BY MOUTH 3 TIMES A DAY WITH FOOD  300 capsule 3   dicyclomine  (BENTYL ) 10 MG capsule TAKE 1 CAPSULE (10 MG TOTAL) BY MOUTH 4 TIMES A DAY BEFORE MEALS AND AT BEDTIME 90 capsule 3   ezetimibe  (ZETIA ) 10 MG tablet TAKE 1 TABLET BY MOUTH EVERY DAY 90 tablet 3   meloxicam  (MOBIC ) 7.5 MG tablet TAKE 1 TABLET BY MOUTH DAILY AS NEEDED FOR PAIN 30 tablet 2   mirabegron  ER (MYRBETRIQ ) 50 MG TB24 tablet Take 1 tablet (50 mg total) by mouth daily. 30 tablet 11   omeprazole  (PRILOSEC) 20 MG capsule Take 1 capsule (20 mg total) by mouth 2 (two) times daily before a meal. 180 capsule 1   polyethylene glycol powder (GAVILAX) 17 GM/SCOOP powder MIX 1 CAPFUL (17 GRAMS) AS DIRECTED AND DRINK TWICE A DAY AS NEEDED FOR MODERATE CONSTIPATION 510 g 5   propranolol  ER (INDERAL  LA) 120 MG 24 hr capsule TAKE 1 CAPSULE BY MOUTH EVERY DAY 90 capsule 2   RESTASIS  0.05 % ophthalmic emulsion Place 1 drop into both eyes 2 (two) times daily.      traZODone  (DESYREL ) 50 MG tablet Take 1-2 tablets (50-100 mg total) by mouth at bedtime. 180 tablet 3   Trolamine LIQD Use Blue Emu on sore joints 3 times a day. 226 mL 2   Vitamin A  2400  MCG (8000 UT) CAPS Take 2,400 mcg by mouth daily.     No current facility-administered medications for this encounter.    Physical Findings: The patient is in no acute distress. Patient is alert and oriented.  vitals were not taken for this visit. .  No significant changes. Lungs are clear to auscultation bilaterally. Heart has regular rate and rhythm. No palpable cervical, supraclavicular, or axillary adenopathy. Abdomen soft, non-tender, normal bowel sounds.  On pelvic examination the external genitalia were unremarkable. A speculum exam was performed. There are no mucosal lesions noted in the vaginal vault. A Pap smear was obtained of the proximal vagina. On bimanual and rectovaginal examination there were no pelvic masses appreciated. ***   Lab Findings: Lab Results  Component Value Date   WBC 5.0 02/29/2024   HGB 12.9  02/29/2024   HCT 37.7 02/29/2024   MCV 95.3 02/29/2024   PLT 219.0 02/29/2024    Radiographic Findings: No results found.  Impression: Stage IIIA grade 2 endometrial cancer; endometrial adenocarcinoma    The patient is recovering from the effects of radiation.  ***  Plan:  ***   *** minutes of total time was spent for this patient encounter, including preparation, face-to-face counseling with the patient and coordination of care, physical exam, and documentation of the encounter. ____________________________________  Lynwood CHARM Nasuti, PhD, MD  This document serves as a record of services personally performed by Lynwood Nasuti, MD. It was created on his behalf by Dorthy Fuse, a trained medical scribe. The creation of this record is based on the scribe's personal observations and the provider's statements to them. This document has been checked and approved by the attending provider.  "

## 2025-01-16 ENCOUNTER — Ambulatory Visit: Admission: RE | Admit: 2025-01-16 | Source: Ambulatory Visit | Admitting: Radiation Oncology

## 2025-01-17 ENCOUNTER — Telehealth: Payer: Self-pay | Admitting: Radiation Oncology

## 2025-01-17 NOTE — Telephone Encounter (Signed)
 Contacted Sophia Santos to r/s missed appt. Sophia Santos advised she would c/b to r/s once she has access to pt's calendar later this afternoon.

## 2025-01-17 NOTE — Telephone Encounter (Signed)
 Left message for patient's relative to call back to reschedule yesterday's appointment due to weather.

## 2025-01-19 ENCOUNTER — Ambulatory Visit: Admitting: Radiation Oncology

## 2025-01-24 ENCOUNTER — Ambulatory Visit: Admitting: Internal Medicine

## 2025-01-25 ENCOUNTER — Ambulatory Visit: Admitting: Radiation Oncology

## 2025-02-06 ENCOUNTER — Ambulatory Visit: Admitting: Radiation Oncology

## 2025-04-21 ENCOUNTER — Inpatient Hospital Stay: Admitting: Gynecologic Oncology

## 2025-12-22 ENCOUNTER — Ambulatory Visit
